# Patient Record
Sex: Female | Born: 1955
Health system: Southern US, Community
[De-identification: ages and names within clinical notes are randomized; demographics above are authoritative.]

## PROBLEM LIST (undated history)

## (undated) DIAGNOSIS — M1990 Unspecified osteoarthritis, unspecified site: Secondary | ICD-10-CM

## (undated) DIAGNOSIS — Z87442 Personal history of urinary calculi: Secondary | ICD-10-CM

## (undated) DIAGNOSIS — K219 Gastro-esophageal reflux disease without esophagitis: Principal | ICD-10-CM

## (undated) DIAGNOSIS — F419 Anxiety disorder, unspecified: Secondary | ICD-10-CM

## (undated) DIAGNOSIS — M8580 Other specified disorders of bone density and structure, unspecified site: Secondary | ICD-10-CM

## (undated) DIAGNOSIS — D069 Carcinoma in situ of cervix, unspecified: Secondary | ICD-10-CM

## (undated) DIAGNOSIS — I10 Essential (primary) hypertension: Secondary | ICD-10-CM

## (undated) DIAGNOSIS — T7840XA Allergy, unspecified, initial encounter: Secondary | ICD-10-CM

## (undated) DIAGNOSIS — N189 Chronic kidney disease, unspecified: Secondary | ICD-10-CM

## (undated) DIAGNOSIS — M7138 Other bursal cyst, other site: Secondary | ICD-10-CM

## (undated) DIAGNOSIS — IMO0002 Reserved for concepts with insufficient information to code with codable children: Secondary | ICD-10-CM

## (undated) DIAGNOSIS — D219 Benign neoplasm of connective and other soft tissue, unspecified: Secondary | ICD-10-CM

## (undated) DIAGNOSIS — Z800 Family history of malignant neoplasm of digestive organs: Secondary | ICD-10-CM

## (undated) DIAGNOSIS — E559 Vitamin D deficiency, unspecified: Secondary | ICD-10-CM

## (undated) HISTORY — DX: Essential (primary) hypertension: I10

## (undated) HISTORY — DX: Anxiety disorder, unspecified: F41.9

## (undated) HISTORY — DX: Benign neoplasm of connective and other soft tissue, unspecified: D21.9

## (undated) HISTORY — PX: LASIK: SHX215

## (undated) HISTORY — DX: Vitamin D deficiency, unspecified: E55.9

## (undated) HISTORY — DX: Reserved for concepts with insufficient information to code with codable children: IMO0002

## (undated) HISTORY — DX: Carcinoma in situ of cervix, unspecified: D06.9

## (undated) HISTORY — DX: Family history of malignant neoplasm of digestive organs: Z80.0

## (undated) HISTORY — DX: Chronic kidney disease, unspecified: N18.9

## (undated) HISTORY — PX: TONSILLECTOMY AND ADENOIDECTOMY: SUR1326

## (undated) HISTORY — PX: COLONOSCOPY: SHX174

## (undated) HISTORY — DX: Allergy, unspecified, initial encounter: T78.40XA

## (undated) HISTORY — DX: Other bursal cyst, other site: M71.38

## (undated) HISTORY — DX: Other specified disorders of bone density and structure, unspecified site: M85.80

## (undated) HISTORY — PX: OTHER SURGICAL HISTORY: SHX169

## (undated) HISTORY — PX: MOHS SURGERY: SUR867

## (undated) HISTORY — DX: Gastro-esophageal reflux disease without esophagitis: K21.9

---

## 1983-07-18 DIAGNOSIS — D069 Carcinoma in situ of cervix, unspecified: Secondary | ICD-10-CM

## 1983-07-18 HISTORY — DX: Carcinoma in situ of cervix, unspecified: D06.9

## 1997-07-12 ENCOUNTER — Ambulatory Visit (HOSPITAL_COMMUNITY): Admission: RE | Admit: 1997-07-12 | Discharge: 1997-07-12 | Payer: Self-pay | Admitting: Obstetrics and Gynecology

## 1998-03-25 ENCOUNTER — Other Ambulatory Visit: Admission: RE | Admit: 1998-03-25 | Discharge: 1998-03-25 | Payer: Self-pay | Admitting: Obstetrics and Gynecology

## 1998-08-21 ENCOUNTER — Encounter: Payer: Self-pay | Admitting: Obstetrics and Gynecology

## 1998-08-21 ENCOUNTER — Ambulatory Visit (HOSPITAL_COMMUNITY): Admission: RE | Admit: 1998-08-21 | Discharge: 1998-08-21 | Payer: Self-pay | Admitting: Obstetrics and Gynecology

## 1998-08-30 ENCOUNTER — Encounter: Payer: Self-pay | Admitting: Obstetrics and Gynecology

## 1998-08-30 ENCOUNTER — Ambulatory Visit (HOSPITAL_COMMUNITY): Admission: RE | Admit: 1998-08-30 | Discharge: 1998-08-30 | Payer: Self-pay | Admitting: Obstetrics and Gynecology

## 1998-12-08 ENCOUNTER — Encounter: Payer: Self-pay | Admitting: Emergency Medicine

## 1998-12-08 ENCOUNTER — Emergency Department (HOSPITAL_COMMUNITY): Admission: EM | Admit: 1998-12-08 | Discharge: 1998-12-08 | Payer: Self-pay | Admitting: Emergency Medicine

## 1999-08-27 ENCOUNTER — Encounter: Payer: Self-pay | Admitting: Family Medicine

## 1999-08-27 ENCOUNTER — Ambulatory Visit (HOSPITAL_COMMUNITY): Admission: RE | Admit: 1999-08-27 | Discharge: 1999-08-27 | Payer: Self-pay | Admitting: Family Medicine

## 2000-09-08 ENCOUNTER — Encounter: Payer: Self-pay | Admitting: Family Medicine

## 2000-09-08 ENCOUNTER — Ambulatory Visit (HOSPITAL_COMMUNITY): Admission: RE | Admit: 2000-09-08 | Discharge: 2000-09-08 | Payer: Self-pay | Admitting: Family Medicine

## 2001-03-02 ENCOUNTER — Encounter: Payer: Self-pay | Admitting: Family Medicine

## 2001-03-02 ENCOUNTER — Ambulatory Visit (HOSPITAL_COMMUNITY): Admission: RE | Admit: 2001-03-02 | Discharge: 2001-03-02 | Payer: Self-pay

## 2001-09-16 ENCOUNTER — Ambulatory Visit (HOSPITAL_COMMUNITY): Admission: RE | Admit: 2001-09-16 | Discharge: 2001-09-16 | Payer: Self-pay | Admitting: Family Medicine

## 2001-09-16 ENCOUNTER — Encounter: Payer: Self-pay | Admitting: Family Medicine

## 2002-10-27 ENCOUNTER — Other Ambulatory Visit: Admission: RE | Admit: 2002-10-27 | Discharge: 2002-10-27 | Payer: Self-pay | Admitting: Obstetrics and Gynecology

## 2003-10-29 ENCOUNTER — Other Ambulatory Visit: Admission: RE | Admit: 2003-10-29 | Discharge: 2003-10-29 | Payer: Self-pay | Admitting: Obstetrics and Gynecology

## 2003-12-06 ENCOUNTER — Ambulatory Visit (HOSPITAL_COMMUNITY): Admission: RE | Admit: 2003-12-06 | Discharge: 2003-12-06 | Payer: Self-pay | Admitting: Obstetrics and Gynecology

## 2004-02-17 DIAGNOSIS — D219 Benign neoplasm of connective and other soft tissue, unspecified: Secondary | ICD-10-CM

## 2004-02-17 HISTORY — DX: Benign neoplasm of connective and other soft tissue, unspecified: D21.9

## 2004-11-18 ENCOUNTER — Other Ambulatory Visit: Admission: RE | Admit: 2004-11-18 | Discharge: 2004-11-18 | Payer: Self-pay | Admitting: Obstetrics and Gynecology

## 2005-03-04 ENCOUNTER — Ambulatory Visit (HOSPITAL_COMMUNITY): Admission: RE | Admit: 2005-03-04 | Discharge: 2005-03-04 | Payer: Self-pay | Admitting: Obstetrics and Gynecology

## 2005-06-23 ENCOUNTER — Emergency Department (HOSPITAL_COMMUNITY): Admission: EM | Admit: 2005-06-23 | Discharge: 2005-06-23 | Payer: Self-pay | Admitting: Family Medicine

## 2005-07-08 ENCOUNTER — Ambulatory Visit: Payer: Self-pay | Admitting: Internal Medicine

## 2005-07-20 ENCOUNTER — Ambulatory Visit: Payer: Self-pay | Admitting: Internal Medicine

## 2005-12-16 ENCOUNTER — Other Ambulatory Visit: Admission: RE | Admit: 2005-12-16 | Discharge: 2005-12-16 | Payer: Self-pay | Admitting: Obstetrics and Gynecology

## 2006-04-14 ENCOUNTER — Ambulatory Visit (HOSPITAL_COMMUNITY): Admission: RE | Admit: 2006-04-14 | Discharge: 2006-04-14 | Payer: Self-pay | Admitting: Obstetrics and Gynecology

## 2006-12-18 DIAGNOSIS — IMO0002 Reserved for concepts with insufficient information to code with codable children: Secondary | ICD-10-CM

## 2006-12-18 HISTORY — DX: Reserved for concepts with insufficient information to code with codable children: IMO0002

## 2006-12-22 ENCOUNTER — Other Ambulatory Visit: Admission: RE | Admit: 2006-12-22 | Discharge: 2006-12-22 | Payer: Self-pay | Admitting: Obstetrics and Gynecology

## 2007-05-18 ENCOUNTER — Other Ambulatory Visit: Admission: RE | Admit: 2007-05-18 | Discharge: 2007-05-18 | Payer: Self-pay | Admitting: Obstetrics and Gynecology

## 2007-08-10 ENCOUNTER — Ambulatory Visit (HOSPITAL_COMMUNITY): Admission: RE | Admit: 2007-08-10 | Discharge: 2007-08-10 | Payer: Self-pay | Admitting: Obstetrics and Gynecology

## 2007-09-08 ENCOUNTER — Emergency Department (HOSPITAL_COMMUNITY): Admission: EM | Admit: 2007-09-08 | Discharge: 2007-09-08 | Payer: Self-pay | Admitting: Family Medicine

## 2007-11-02 ENCOUNTER — Other Ambulatory Visit: Admission: RE | Admit: 2007-11-02 | Discharge: 2007-11-02 | Payer: Self-pay | Admitting: Obstetrics and Gynecology

## 2008-05-30 ENCOUNTER — Other Ambulatory Visit: Admission: RE | Admit: 2008-05-30 | Discharge: 2008-05-30 | Payer: Self-pay | Admitting: Obstetrics and Gynecology

## 2008-09-05 ENCOUNTER — Ambulatory Visit (HOSPITAL_COMMUNITY): Admission: RE | Admit: 2008-09-05 | Discharge: 2008-09-05 | Payer: Self-pay | Admitting: Obstetrics and Gynecology

## 2009-02-16 DIAGNOSIS — K219 Gastro-esophageal reflux disease without esophagitis: Secondary | ICD-10-CM

## 2009-02-16 HISTORY — DX: Gastro-esophageal reflux disease without esophagitis: K21.9

## 2009-05-18 ENCOUNTER — Emergency Department (HOSPITAL_BASED_OUTPATIENT_CLINIC_OR_DEPARTMENT_OTHER): Admission: EM | Admit: 2009-05-18 | Discharge: 2009-05-18 | Payer: Self-pay | Admitting: Emergency Medicine

## 2009-05-28 ENCOUNTER — Encounter: Admission: RE | Admit: 2009-05-28 | Discharge: 2009-05-28 | Payer: Self-pay | Admitting: Internal Medicine

## 2009-08-25 ENCOUNTER — Emergency Department (HOSPITAL_COMMUNITY): Admission: EM | Admit: 2009-08-25 | Discharge: 2009-08-25 | Payer: Self-pay | Admitting: Family Medicine

## 2009-11-04 ENCOUNTER — Ambulatory Visit (HOSPITAL_COMMUNITY): Admission: RE | Admit: 2009-11-04 | Discharge: 2009-11-04 | Payer: Self-pay | Admitting: Interventional Cardiology

## 2009-11-20 ENCOUNTER — Ambulatory Visit (HOSPITAL_COMMUNITY): Admission: RE | Admit: 2009-11-20 | Discharge: 2009-11-20 | Payer: Self-pay | Admitting: Obstetrics and Gynecology

## 2010-03-09 ENCOUNTER — Encounter: Payer: Self-pay | Admitting: Obstetrics and Gynecology

## 2010-10-27 ENCOUNTER — Other Ambulatory Visit: Payer: Self-pay | Admitting: Obstetrics and Gynecology

## 2010-10-27 DIAGNOSIS — Z1231 Encounter for screening mammogram for malignant neoplasm of breast: Secondary | ICD-10-CM

## 2010-11-26 ENCOUNTER — Ambulatory Visit (HOSPITAL_COMMUNITY)
Admission: RE | Admit: 2010-11-26 | Discharge: 2010-11-26 | Disposition: A | Discharge status: Home / Self Care | Payer: 59 | Source: Ambulatory Visit | Attending: Obstetrics and Gynecology | Admitting: Obstetrics and Gynecology

## 2010-11-26 DIAGNOSIS — Z1231 Encounter for screening mammogram for malignant neoplasm of breast: Secondary | ICD-10-CM | POA: Insufficient documentation

## 2011-02-02 ENCOUNTER — Encounter (HOSPITAL_COMMUNITY): Payer: Self-pay

## 2011-02-18 ENCOUNTER — Encounter (HOSPITAL_COMMUNITY): Payer: Self-pay

## 2011-02-18 ENCOUNTER — Encounter (HOSPITAL_COMMUNITY)
Admission: RE | Admit: 2011-02-18 | Discharge: 2011-02-18 | Disposition: A | Discharge status: Home / Self Care | Payer: 59 | Source: Ambulatory Visit | Attending: Obstetrics and Gynecology | Admitting: Obstetrics and Gynecology

## 2011-02-18 ENCOUNTER — Other Ambulatory Visit: Payer: Self-pay

## 2011-02-18 HISTORY — DX: Essential (primary) hypertension: I10

## 2011-02-18 LAB — CBC
MCV: 92.4 fL (ref 78.0–100.0)
Platelets: 356 10*3/uL (ref 150–400)
RBC: 4.22 MIL/uL (ref 3.87–5.11)
RDW: 12.4 % (ref 11.5–15.5)
WBC: 8.8 10*3/uL (ref 4.0–10.5)

## 2011-02-18 LAB — COMPREHENSIVE METABOLIC PANEL
ALT: 11 U/L (ref 0–35)
AST: 15 U/L (ref 0–37)
Albumin: 3.4 g/dL — ABNORMAL LOW (ref 3.5–5.2)
Chloride: 101 mEq/L (ref 96–112)
Creatinine, Ser: 0.61 mg/dL (ref 0.50–1.10)
Sodium: 136 mEq/L (ref 135–145)
Total Bilirubin: 0.4 mg/dL (ref 0.3–1.2)

## 2011-02-18 NOTE — Patient Instructions (Addendum)
[address] Debra Nguyen  02/18/2011   Your procedure is scheduled on:  02/23/11 7:30  Enter through the Main Entrance of [facility] at 600 AM.  Pick up the phone at the desk and dial [phone].   Call this number if you have problems the morning of surgery: [phone]   Remember:   Do not eat food:After Midnight.  Do not drink clear liquids: After Midnight.  Take these medicines the morning of surgery with A SIP OF WATER: Celebrex per instructions of Dr. Tresa Res   Do not wear jewelry, make-up or nail polish.  Do not wear lotions, powders, or perfumes. You may wear deodorant.  Do not shave 48 hours prior to surgery.  Do not bring valuables to the hospital.  Contacts, dentures or bridgework may not be worn into surgery.  Leave suitcase in the car. After surgery it may be brought to your room.  For patients admitted to the hospital, checkout time is 11:00 AM the day of discharge.   Patients discharged the day of surgery will not be allowed to drive home.  Name and phone number of your driver: undecided  Special Instructions: CHG Shower Use Special Wash: 1/2 bottle night before surgery and 1/2 bottle morning of surgery.   Please read over the following fact sheets that you were given: MRSA Information

## 2011-02-18 NOTE — Pre-Procedure Instructions (Signed)
EKG approved by Dr. Brayton Caves.

## 2011-02-20 NOTE — H&P (Signed)
56 yo DWF G 2 P 2 with post menopausal bleeding and recurrent CIN for R-TLH 02-23-11.  PUS last done 11/30/`11/and showed her uterus was 11 x 6 x 7 cm with 4 fibroids, the largest being 6 x 5 cm, and myometrial streaking c/w adenomyosis.  Adnexa were wnl.  Endometrial biopsy at that time was benign.  Patient also has a long history of abnormal paps, dating to 45 when she had cryo for CIN 3, and since that time has intermittantly had mildly abnormal paps, ASCUS +HPV.  Pap 12/18/2010 was LGSIL.  Patient has had previous colposcopies demonstrating mild disease.  She is on HRT and wants to continue it, however is weary of the recurring DUB and the recurrent colposcopies.  Pt elects a trial of difinitive tx with R-TLH at this time.  She does clearly understand that the +HPV can recur in the vagina after hysterectomy.  We discussed that a colposcopy to rule out invasive disease is standard of care before hysterectomy, and patient elected to forgo colposcopy due to the very low probability of invasive disease.  She does accept the risk that if she does have invasive disease, she will need further surgery.    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  Neg cig.  Social alcohol.  PE: WF in NAD  BMI 23.4  HEENT WNL  Lungs Clear to A & P  Heart:  RRR  Abd:  Soft, NT,  No masses or HSM  Ext:  NL  Pelvic:  Ext, vag, cx normal.  Uterus mid position, with large fibroid off to right, mostly into CDS.  Fibroid is about 7 cm.  Adnexa WNL.  Endometrial biopsy repeated 01/28/11 and was benign.   Pt instructed in celebrex 200 mg two tabs am of surgery, then one po bid to total #6.  Given RX for percocet 5/325 for use post op.  Stop premarin 02/11/11 and change to vivelle dot 0.05 mg 2 times per week until surgery and for at least 2 weeks after to decrease DVT risk.     Risks and poss complications discussed and accepted by patient.  A:  PMB, fibroids, recurrent CIN  P:  R-TLH, retain ovaries.  However, if I think there may be an abnormality at the time of surgery, patient gives me permission to remove one or both ovaries and tubes.

## 2011-02-22 MED ORDER — DEXTROSE 5 % IV SOLN
1.0000 g | INTRAVENOUS | Status: AC
  Administered 2011-02-23: 1 g via INTRAVENOUS
  Filled 2011-02-22: qty 1

## 2011-02-23 ENCOUNTER — Ambulatory Visit (HOSPITAL_COMMUNITY): Payer: 59 | Admitting: Anesthesiology

## 2011-02-23 ENCOUNTER — Encounter (HOSPITAL_COMMUNITY): Payer: Self-pay | Admitting: *Deleted

## 2011-02-23 ENCOUNTER — Encounter (HOSPITAL_COMMUNITY): Payer: Self-pay | Admitting: Anesthesiology

## 2011-02-23 ENCOUNTER — Ambulatory Visit (HOSPITAL_COMMUNITY)
Admission: RE | Admit: 2011-02-23 | Discharge: 2011-02-23 | Disposition: A | Discharge status: Home / Self Care | Payer: 59 | Source: Ambulatory Visit | Attending: Obstetrics and Gynecology | Admitting: Obstetrics and Gynecology

## 2011-02-23 ENCOUNTER — Encounter (HOSPITAL_COMMUNITY)
Admission: RE | Disposition: A | Discharge status: Home / Self Care | Payer: Self-pay | Source: Ambulatory Visit | Attending: Obstetrics and Gynecology

## 2011-02-23 ENCOUNTER — Other Ambulatory Visit: Payer: Self-pay | Admitting: Obstetrics and Gynecology

## 2011-02-23 DIAGNOSIS — D251 Intramural leiomyoma of uterus: Secondary | ICD-10-CM | POA: Insufficient documentation

## 2011-02-23 DIAGNOSIS — N8000 Endometriosis of the uterus, unspecified: Secondary | ICD-10-CM | POA: Insufficient documentation

## 2011-02-23 DIAGNOSIS — N870 Mild cervical dysplasia: Secondary | ICD-10-CM | POA: Insufficient documentation

## 2011-02-23 DIAGNOSIS — Z01818 Encounter for other preprocedural examination: Secondary | ICD-10-CM | POA: Insufficient documentation

## 2011-02-23 DIAGNOSIS — Z01812 Encounter for preprocedural laboratory examination: Secondary | ICD-10-CM | POA: Insufficient documentation

## 2011-02-23 DIAGNOSIS — N950 Postmenopausal bleeding: Secondary | ICD-10-CM | POA: Insufficient documentation

## 2011-02-23 DIAGNOSIS — I10 Essential (primary) hypertension: Secondary | ICD-10-CM | POA: Insufficient documentation

## 2011-02-23 HISTORY — PX: ABDOMINAL HYSTERECTOMY: SHX81

## 2011-02-23 HISTORY — PX: CYSTOSCOPY: SHX5120

## 2011-02-23 SURGERY — ROBOTIC ASSISTED TOTAL HYSTERECTOMY
Anesthesia: General | Site: Urethra | Wound class: Clean Contaminated

## 2011-02-23 MED ORDER — NEOSTIGMINE METHYLSULFATE 1 MG/ML IJ SOLN
INTRAMUSCULAR | Status: DC | PRN
  Administered 2011-02-23: 2 mg via INTRAVENOUS

## 2011-02-23 MED ORDER — ROPIVACAINE HCL 5 MG/ML IJ SOLN
INTRAMUSCULAR | Status: DC | PRN
  Administered 2011-02-23: 88 mL via EPIDURAL

## 2011-02-23 MED ORDER — LACTATED RINGERS IV SOLN
INTRAVENOUS | Status: DC
  Administered 2011-02-23: 07:00:00 via INTRAVENOUS

## 2011-02-23 MED ORDER — PROPOFOL 10 MG/ML IV EMUL
INTRAVENOUS | Status: AC
  Filled 2011-02-23: qty 20

## 2011-02-23 MED ORDER — OXYCODONE-ACETAMINOPHEN 5-325 MG PO TABS
1.0000 | ORAL_TABLET | ORAL | Status: DC | PRN
  Administered 2011-02-23: 2 via ORAL
  Filled 2011-02-23: qty 2

## 2011-02-23 MED ORDER — PROPOFOL 10 MG/ML IV EMUL
INTRAVENOUS | Status: DC | PRN
  Administered 2011-02-23: 160 mg via INTRAVENOUS

## 2011-02-23 MED ORDER — FENTANYL CITRATE 0.05 MG/ML IJ SOLN
INTRAMUSCULAR | Status: AC
  Filled 2011-02-23: qty 2

## 2011-02-23 MED ORDER — ONDANSETRON HCL 4 MG/2ML IJ SOLN
INTRAMUSCULAR | Status: DC | PRN
  Administered 2011-02-23: 4 mg via INTRAVENOUS

## 2011-02-23 MED ORDER — HYDROMORPHONE HCL PF 1 MG/ML IJ SOLN
INTRAMUSCULAR | Status: AC
  Filled 2011-02-23: qty 1

## 2011-02-23 MED ORDER — SODIUM CHLORIDE 0.9 % IJ SOLN
INTRAMUSCULAR | Status: DC | PRN
  Administered 2011-02-23: 88 mL

## 2011-02-23 MED ORDER — LIDOCAINE HCL (CARDIAC) 20 MG/ML IV SOLN
INTRAVENOUS | Status: DC | PRN
  Administered 2011-02-23: 50 mg via INTRAVENOUS

## 2011-02-23 MED ORDER — FENTANYL CITRATE 0.05 MG/ML IJ SOLN
25.0000 ug | INTRAMUSCULAR | Status: DC | PRN
  Administered 2011-02-23 (×2): 50 ug via INTRAVENOUS

## 2011-02-23 MED ORDER — FENTANYL CITRATE 0.05 MG/ML IJ SOLN
INTRAMUSCULAR | Status: DC | PRN
  Administered 2011-02-23: 150 ug via INTRAVENOUS
  Administered 2011-02-23 (×2): 100 ug via INTRAVENOUS

## 2011-02-23 MED ORDER — GLYCOPYRROLATE 0.2 MG/ML IJ SOLN
INTRAMUSCULAR | Status: DC | PRN
  Administered 2011-02-23: 0.2 mg via INTRAVENOUS
  Administered 2011-02-23: .4 mg via INTRAVENOUS

## 2011-02-23 MED ORDER — DEXAMETHASONE SODIUM PHOSPHATE 10 MG/ML IJ SOLN
INTRAMUSCULAR | Status: AC
  Filled 2011-02-23: qty 1

## 2011-02-23 MED ORDER — STERILE WATER FOR IRRIGATION IR SOLN
Status: DC | PRN
  Administered 2011-02-23: 1000 mL

## 2011-02-23 MED ORDER — DEXAMETHASONE SODIUM PHOSPHATE 10 MG/ML IJ SOLN
INTRAMUSCULAR | Status: DC | PRN
  Administered 2011-02-23: 10 mg via INTRAVENOUS

## 2011-02-23 MED ORDER — ROCURONIUM BROMIDE 100 MG/10ML IV SOLN
INTRAVENOUS | Status: DC | PRN
  Administered 2011-02-23: 50 mg via INTRAVENOUS
  Administered 2011-02-23: 20 mg via INTRAVENOUS

## 2011-02-23 MED ORDER — FENTANYL CITRATE 0.05 MG/ML IJ SOLN
INTRAMUSCULAR | Status: AC
  Filled 2011-02-23: qty 5

## 2011-02-23 MED ORDER — MIDAZOLAM HCL 5 MG/5ML IJ SOLN
INTRAMUSCULAR | Status: DC | PRN
  Administered 2011-02-23: 2 mg via INTRAVENOUS

## 2011-02-23 MED ORDER — ARTIFICIAL TEARS OP OINT
TOPICAL_OINTMENT | OPHTHALMIC | Status: DC | PRN
  Administered 2011-02-23: 1 via OPHTHALMIC

## 2011-02-23 MED ORDER — ROCURONIUM BROMIDE 50 MG/5ML IV SOLN
INTRAVENOUS | Status: AC
  Filled 2011-02-23: qty 1

## 2011-02-23 MED ORDER — MIDAZOLAM HCL 2 MG/2ML IJ SOLN
INTRAMUSCULAR | Status: AC
  Filled 2011-02-23: qty 2

## 2011-02-23 MED ORDER — ONDANSETRON HCL 4 MG/2ML IJ SOLN
INTRAMUSCULAR | Status: AC
  Filled 2011-02-23: qty 2

## 2011-02-23 MED ORDER — PROMETHAZINE HCL 25 MG/ML IJ SOLN: 12.5000 mg | INTRAMUSCULAR | Status: DC | PRN

## 2011-02-23 MED ORDER — ACETAMINOPHEN 10 MG/ML IV SOLN
1000.0000 mg | Freq: Once | INTRAVENOUS | Status: DC | PRN
  Filled 2011-02-23: qty 100

## 2011-02-23 MED ORDER — LACTATED RINGERS IR SOLN
Status: DC | PRN
  Administered 2011-02-23: 3000 mL

## 2011-02-23 MED ORDER — HYDROMORPHONE HCL PF 1 MG/ML IJ SOLN
INTRAMUSCULAR | Status: DC | PRN
  Administered 2011-02-23: 0.5 mg via INTRAVENOUS

## 2011-02-23 MED ORDER — INDIGOTINDISULFONATE SODIUM 8 MG/ML IJ SOLN
INTRAMUSCULAR | Status: DC | PRN
  Administered 2011-02-23: 5 mL via INTRAVENOUS

## 2011-02-23 MED ORDER — LIDOCAINE HCL (CARDIAC) 20 MG/ML IV SOLN
INTRAVENOUS | Status: AC
  Filled 2011-02-23: qty 5

## 2011-02-23 SURGICAL SUPPLY — 74 items
ADH SKN CLS APL DERMABOND .7 (GAUZE/BANDAGES/DRESSINGS) ×9
APL SKNCLS STERI-STRIP NONHPOA (GAUZE/BANDAGES/DRESSINGS)
BAG URINE DRAINAGE (UROLOGICAL SUPPLIES) ×4 IMPLANT
BARRIER ADHS 3X4 INTERCEED (GAUZE/BANDAGES/DRESSINGS) ×4 IMPLANT
BENZOIN TINCTURE PRP APPL 2/3 (GAUZE/BANDAGES/DRESSINGS) IMPLANT
BLADELESS LONG 8MM (BLADE) IMPLANT
BRR ADH 4X3 ABS CNTRL BYND (GAUZE/BANDAGES/DRESSINGS) ×3
CABLE HIGH FREQUENCY MONO STRZ (ELECTRODE) ×4 IMPLANT
CATH FOLEY 3WAY  5CC 16FR (CATHETERS) ×1
CATH FOLEY 3WAY 5CC 16FR (CATHETERS) ×3 IMPLANT
CONT PATH 16OZ SNAP LID 3702 (MISCELLANEOUS) ×4 IMPLANT
COVER MAYO STAND STRL (DRAPES) ×4 IMPLANT
COVER TABLE BACK 60X90 (DRAPES) ×8 IMPLANT
COVER TIP SHEARS 8 DVNC (MISCELLANEOUS) ×3 IMPLANT
COVER TIP SHEARS 8MM DA VINCI (MISCELLANEOUS) ×1
DECANTER SPIKE VIAL GLASS SM (MISCELLANEOUS) ×4 IMPLANT
DERMABOND ADVANCED (GAUZE/BANDAGES/DRESSINGS) ×3
DERMABOND ADVANCED .7 DNX12 (GAUZE/BANDAGES/DRESSINGS) ×9 IMPLANT
DRAPE HUG U DISPOSABLE (DRAPE) ×4 IMPLANT
DRAPE LG THREE QUARTER DISP (DRAPES) ×8 IMPLANT
DRAPE MONITOR DA VINCI (DRAPE) IMPLANT
DRAPE WARM FLUID 44X44 (DRAPE) ×4 IMPLANT
ELECT REM PT RETURN 9FT ADLT (ELECTROSURGICAL) ×4
ELECTRODE REM PT RTRN 9FT ADLT (ELECTROSURGICAL) ×3 IMPLANT
EVACUATOR SMOKE 8.L (FILTER) ×4 IMPLANT
GAUZE VASELINE 3X9 (GAUZE/BANDAGES/DRESSINGS) IMPLANT
GLOVE BIO SURGEON STRL SZ 6.5 (GLOVE) ×12 IMPLANT
GLOVE BIOGEL PI IND STRL 7.0 (GLOVE) ×12 IMPLANT
GLOVE BIOGEL PI INDICATOR 7.0 (GLOVE) ×4
GLOVE ECLIPSE 6.5 STRL STRAW (GLOVE) ×16 IMPLANT
GOWN PREVENTION PLUS LG XLONG (DISPOSABLE) ×28 IMPLANT
GYRUS RUMI II 2.5CM BLUE (DISPOSABLE)
GYRUS RUMI II 3.5CM BLUE (DISPOSABLE)
GYRUS RUMI II 4.0CM BLUE (DISPOSABLE)
KIT ACCESSORY DA VINCI DISP (KITS) ×1
KIT ACCESSORY DVNC DISP (KITS) ×3 IMPLANT
KIT DISP ACCESSORY 4 ARM (KITS) IMPLANT
NEEDLE INSUFFLATION 14GA 120MM (NEEDLE) ×4 IMPLANT
NS IRRIG 1000ML POUR BTL (IV SOLUTION) ×12 IMPLANT
OCCLUDER COLPOPNEUMO (BALLOONS) ×4 IMPLANT
PACK LAVH (CUSTOM PROCEDURE TRAY) ×4 IMPLANT
PAD PREP 24X48 CUFFED NSTRL (MISCELLANEOUS) ×8 IMPLANT
PLUG CATH AND CAP STER (CATHETERS) ×4 IMPLANT
RUMI II 3.0CM BLUE KOH-EFFICIE (DISPOSABLE) IMPLANT
RUMI II GYRUS 2.5CM BLUE (DISPOSABLE) IMPLANT
RUMI II GYRUS 3.5CM BLUE (DISPOSABLE) IMPLANT
RUMI II GYRUS 4.0CM BLUE (DISPOSABLE) IMPLANT
SET CYSTO W/LG BORE CLAMP LF (SET/KITS/TRAYS/PACK) ×4 IMPLANT
SET IRRIG TUBING LAPAROSCOPIC (IRRIGATION / IRRIGATOR) ×4 IMPLANT
SOLUTION ELECTROLUBE (MISCELLANEOUS) ×4 IMPLANT
SPONGE LAP 18X18 X RAY DECT (DISPOSABLE) IMPLANT
STRIP CLOSURE SKIN 1/4X4 (GAUZE/BANDAGES/DRESSINGS) IMPLANT
SUT VIC AB 0 CT1 27 (SUTURE) ×24
SUT VIC AB 0 CT1 27XBRD ANBCTR (SUTURE) ×3 IMPLANT
SUT VIC AB 0 CT1 27XBRD ANTBC (SUTURE) ×15 IMPLANT
SUT VICRYL 0 UR6 27IN ABS (SUTURE) ×4 IMPLANT
SUT VICRYL 4-0 PS2 18IN ABS (SUTURE) ×8 IMPLANT
SUT VICRYL RAPIDE 4/0 PS 2 (SUTURE) ×8 IMPLANT
SYR 30ML LL (SYRINGE) ×4 IMPLANT
SYR 50ML LL SCALE MARK (SYRINGE) ×4 IMPLANT
SYSTEM CONVERTIBLE TROCAR (TROCAR) IMPLANT
TIP UTERINE 5.1X6CM LAV DISP (MISCELLANEOUS) IMPLANT
TIP UTERINE 6.7X10CM GRN DISP (MISCELLANEOUS) IMPLANT
TIP UTERINE 6.7X6CM WHT DISP (MISCELLANEOUS) IMPLANT
TIP UTERINE 6.7X8CM BLUE DISP (MISCELLANEOUS) ×4 IMPLANT
TOWEL OR 17X24 6PK STRL BLUE (TOWEL DISPOSABLE) ×12 IMPLANT
TROCAR 12M 150ML BLUNT (TROCAR) ×4 IMPLANT
TROCAR DISP BLADELESS 8 DVNC (TROCAR) ×3 IMPLANT
TROCAR DISP BLADELESS 8MM (TROCAR) ×1
TROCAR XCEL 12X100 BLDLESS (ENDOMECHANICALS) IMPLANT
TROCAR XCEL NON-BLD 5MMX100MML (ENDOMECHANICALS) ×4 IMPLANT
TROCAR Z-THREAD 12X150 (TROCAR) ×4 IMPLANT
TUBING FILTER THERMOFLATOR (ELECTROSURGICAL) ×4 IMPLANT
WARMER LAPAROSCOPE (MISCELLANEOUS) ×4 IMPLANT

## 2011-02-23 NOTE — Progress Notes (Signed)
Pt discharged to home with neighbor/friend, Pam.  Condition stable.  Pt ambulated to car with C. Mayford Knife, [place].  No equipment ordered for home at discharge.

## 2011-02-23 NOTE — Op Note (Signed)
Preoperative diagnosis: Recurrent cervical intraepithelial neoplasia, large uterine fibroids Postoperative diagnosis: Same, path pending Procedure: Robotic assisted total laparoscopic hysterectomy Surgeon Dr. Meredeth Ide Assistant: Dr. Leda Quail Anesthesia: Gen. endotracheal Estimated blood loss: 50 cc Complications: None Procedure: The patient was taken to the operating room and after induction of adequate general endotracheal anesthesia was placed in the low dorsolithotomy position and prepped and draped in usual fashion a posterior weighted and anterior Deaver retractor were placed and the cervix was grasped on its anterior lip with a single-tooth tenaculum. the vaginal mucosa around the cervix was infiltrated with 10 cc of quarter percent ropivacaine. . the uterus sounded to 8.5 cm. the cervix was dilated to #19 Prat. a suture of 0 Vicryl was placed thru the anterior lip. an 8mm Rumi manipulator with a small Koh ring was placed into the uterus and around the cervix.  The suture was then tied over the [facility] ring.  The balloon was inflated with 8 cc of sterile saline.  A Foley catheter was placed.  Attention was next turned to the abdomen.  A small vertical incision was made in the crease just above the umbilicus.  Was done after infiltrating the skin with the quarter percent ropivacaine solution.  Pneumoperitoneum was then created with 3 L of CO2 using the automatic insufflator.  A Veress needle was removed.  An area in the right upper quadrant in the midclavicular line below the costal margin was infiltrated with dilute ropivacaine incised with a knife, and a 5 mm Optiview scope was placed into the peritoneal cavity.  Next a 12 mm disposable bladed trocar was placed under direct visualization at the umbilicus into the peritoneal space.  The abdominal wall was transilluminated with the life and a 5 mm scope and the sites for the robotic ports were marked on the right and left sides of the umbilicus  in a line parallel with the anterior superior iliac spine.  The skin was infiltrated with a dilute ropivacaine incised with a knife and the 8 mm trochars were inserted under direct visualization.  Robotic scope was then introduced.  The patient was placed in steep Trendelenburg, and the robot was brought in and docked from the left side.  Monopolar scissors were placed and port one in the PK gyrus to port to under direct visualization.  The surgeon then proceeded to the consult.  The uterus was enlarged with approximately 6 or 7 cm fibroid in the right uterine fundus but the uterus was mobile.  The tubes and ovaries did appear normal.  The ureters could be seen peristalsing clearly bilaterally.  Hysterectomy began by cauterizing the utero-ovarian ligaments tube and round ligament on either side.  These were incised with a sharp scissors.  Anterior and posterior leaves of the broad ligament were taken down sharply.  The bladder was dissected off the [facility] ring.  The uterine arteries were skeletonized, coagulated multiple times, and incised.  A circumferential colpotomy incision was made with Mono Polar cautery.  The specimen was delivered through the vagina.  The occluder was placed back into the vagina to maintain pneumoperitoneum.  The robotic camera was removed and 3 sutures of 0 Vicryl were placed into the abdomen, and the scope then replaced.  2 of the sutures were parked in the right paracolic gutter and the surgeon used the third one to began closing the vagina.  Closure vagina again on the patient's right with a figure-of-eight suture in the right ankle.  Next a figure-of-eight suture was  placed in the left ankle, and then also one in the midline.  The vagina was closed nicely, however there were some scant bleeding in the left ankle.  Therefore a fourth suture was dropped through the local port, and it was used to pull one more figure-of-eight suture in the left ankle.  This completed hemostasis.  Vaginal cuff  was irrigated and was hemostatic.  All the pedicles were inspected and were hemostatic.  The ureters bilaterally were clearly seen peristalsing.  Vaginal cuff sutures were cut and brought out through the 12 mm port at the umbilicus.  A sheet of Interceed was placed through the umbilical port and then placed over the vaginal cuff.  The upper abdomen had been inspected prior to beginning hysterectomy, and was found to be normal.  The robotic instruments were removed, and the robot was undocked and taken out of the surgical field.  The robotic trochars were removed under direct visualization.  Pneumoperitoneum was allowed to escape, as the anesthetist gave the patient deep breaths manually to assistant evacuating the CO2.  All the trocar sleeves were removed.  Fascia was closed with 0 Vicryl at the umbilicus.  The skin was closed at all sites with a 4-0 Vicryl Rapide and Dermabond.  Cystoscopy was then carried out, filling the bladder with 200 cc of sterile water, and both ureters could be seen extruding indigo carmine clearly.  The indigo carmine had been given IV as the procedure was being completed.  The cystoscope was removed and the Foley catheter was inserted only long enough to drain the remaining fluid from the bladder, and was then removed.  Cystoscopy of the bladder was otherwise normal, there being no sutures in the bladder or any abnormalities noted.  The procedure was terminated, and the patient was taken to the recovery room in satisfactory condition.  Sponge needle and instrument counts were correct x3.

## 2011-02-23 NOTE — Progress Notes (Signed)
Phoned Dr Tresa Res with update of patient's condition including urinary output of from 9:15am to 16:00pm.  VSS.  Incision sites normal.  Pt drinking large amounts of fluids ( )  with no nausea and pt ambulating without difficulty.  Dr. Tresa Res said to proceed with discharge as planned and to encourage pt to push PO fluids at home.

## 2011-02-23 NOTE — Anesthesia Preprocedure Evaluation (Signed)
Anesthesia Evaluation  Patient identified by MRN, date of birth, ID band Patient awake    Reviewed: Allergy & Precautions, H&P , NPO status , Patient's Chart, lab work & pertinent test results, reviewed documented beta blocker date and time   History of Anesthesia Complications Negative for: history of anesthetic complications  Airway Mallampati: I TM Distance: >3 FB Neck ROM: full    Dental  (+) Teeth Intact   Pulmonary neg pulmonary ROS,  clear to auscultation  Pulmonary exam normal       Cardiovascular Exercise Tolerance: Good hypertension (BP 140/99 today, relates to anxiety - normally 120s/70s), On Home Beta Blockers regular Normal    Neuro/Psych Negative Neurological ROS  Negative Psych ROS   GI/Hepatic negative GI ROS, Neg liver ROS,   Endo/Other  Negative Endocrine ROS  Renal/GU negative Renal ROS  Genitourinary negative   Musculoskeletal   Abdominal   Peds  Hematology negative hematology ROS (+)   Anesthesia Other Findings   Reproductive/Obstetrics negative OB ROS                           Anesthesia Physical Anesthesia Plan  ASA: II  Anesthesia Plan: General ETT   Post-op Pain Management:    Induction:   Airway Management Planned:   Additional Equipment:   Intra-op Plan:   Post-operative Plan:   Informed Consent: I have reviewed the patients History and Physical, chart, labs and discussed the procedure including the risks, benefits and alternatives for the proposed anesthesia with the patient or authorized representative who has indicated his/her understanding and acceptance.   Dental Advisory Given  Plan Discussed with: CRNA and Surgeon  Anesthesia Plan Comments:         Anesthesia Quick Evaluation

## 2011-02-23 NOTE — Transfer of Care (Signed)
Immediate Anesthesia Transfer of Care Note  Patient: Debra Nguyen  Procedure(s) Performed:  ROBOTIC ASSISTED TOTAL HYSTERECTOMY; CYSTOSCOPY  Patient Location: PACU  Anesthesia Type: General  Level of Consciousness: awake, alert  and oriented  Airway & Oxygen Therapy: Patient Spontanous Breathing and Patient connected to nasal cannula oxygen  Post-op Assessment: Report given to PACU RN and Post -op Vital signs reviewed and stable  Post vital signs: Reviewed and stable  Complications: No apparent anesthesia complications

## 2011-02-23 NOTE — Anesthesia Procedure Notes (Signed)
Procedure Name: Intubation Date/Time: 02/23/2011 7:31 AM Performed by: Madison Hickman Pre-anesthesia Checklist: Patient identified, Patient being monitored, Emergency Drugs available, Timeout performed and Suction available Patient Re-evaluated:Patient Re-evaluated prior to inductionOxygen Delivery Method: Circle System Utilized Preoxygenation: Pre-oxygenation with 100% oxygen Intubation Type: IV induction Ventilation: Mask ventilation without difficulty Laryngoscope Size: Mac and 3 Grade View: Grade I Tube type: Oral Tube size: 7.0 mm Number of attempts: 1 Airway Equipment and Method: stylet Placement Confirmation: ETT inserted through vocal cords under direct vision,  breath sounds checked- equal and bilateral and positive ETCO2 Secured at: 20 cm Tube secured with: Tape Dental Injury: Teeth and Oropharynx as per pre-operative assessment

## 2011-02-23 NOTE — Anesthesia Postprocedure Evaluation (Signed)
  Anesthesia Post-op Note  Patient: Debra Nguyen  Procedure(s) Performed:  ROBOTIC ASSISTED TOTAL HYSTERECTOMY; CYSTOSCOPY  Patient Location: PACU  Anesthesia Type: General  Level of Consciousness: awake, alert  and oriented  Airway and Oxygen Therapy: Patient Spontanous Breathing  Post-op Pain: none  Post-op Assessment: Post-op Vital signs reviewed, Patient's Cardiovascular Status Stable, Respiratory Function Stable, Patent Airway, No signs of Nausea or vomiting and Pain level controlled  Post-op Vital Signs: Reviewed and stable  Complications: No apparent anesthesia complications

## 2011-02-23 NOTE — Interval H&P Note (Signed)
History and Physical Interval Note:  02/23/2011 7:20 AM  Debra Nguyen  has presented today for surgery, with the diagnosis of fibroids, persistent LGSIL on pap   The various methods of treatment have been discussed with the patient and family. After consideration of risks, benefits and other options for treatment, the patient has consented to  Procedure(s): ROBOTIC ASSISTED TOTAL HYSTERECTOMY SALPINGO OOPHERECTOMY as a surgical intervention .  The patients' history has been reviewed, patient examined, no change in status, stable for surgery.  I have reviewed the patients' chart and labs.  Questions were answered to the patient's satisfaction.     [name]

## 2011-02-24 ENCOUNTER — Encounter (HOSPITAL_COMMUNITY): Payer: Self-pay | Admitting: Obstetrics and Gynecology

## 2011-10-28 ENCOUNTER — Other Ambulatory Visit: Payer: Self-pay | Admitting: Obstetrics and Gynecology

## 2011-10-28 DIAGNOSIS — Z1231 Encounter for screening mammogram for malignant neoplasm of breast: Secondary | ICD-10-CM

## 2011-12-02 ENCOUNTER — Ambulatory Visit (HOSPITAL_COMMUNITY)
Admission: RE | Admit: 2011-12-02 | Discharge: 2011-12-02 | Disposition: A | Discharge status: Home / Self Care | Payer: 59 | Source: Ambulatory Visit | Attending: Obstetrics and Gynecology | Admitting: Obstetrics and Gynecology

## 2011-12-02 DIAGNOSIS — Z1231 Encounter for screening mammogram for malignant neoplasm of breast: Secondary | ICD-10-CM | POA: Insufficient documentation

## 2012-06-14 ENCOUNTER — Other Ambulatory Visit (HOSPITAL_COMMUNITY): Payer: Self-pay | Admitting: Family Medicine

## 2012-06-14 ENCOUNTER — Ambulatory Visit (HOSPITAL_COMMUNITY)
Admission: RE | Admit: 2012-06-14 | Discharge: 2012-06-14 | Disposition: A | Discharge status: Home / Self Care | Payer: 59 | Source: Ambulatory Visit | Attending: Family Medicine | Admitting: Family Medicine

## 2012-06-14 DIAGNOSIS — R05 Cough: Secondary | ICD-10-CM

## 2012-06-14 DIAGNOSIS — R059 Cough, unspecified: Secondary | ICD-10-CM | POA: Insufficient documentation

## 2012-06-14 DIAGNOSIS — R079 Chest pain, unspecified: Secondary | ICD-10-CM | POA: Insufficient documentation

## 2012-06-14 DIAGNOSIS — M542 Cervicalgia: Secondary | ICD-10-CM | POA: Insufficient documentation

## 2012-06-14 DIAGNOSIS — M25519 Pain in unspecified shoulder: Secondary | ICD-10-CM | POA: Insufficient documentation

## 2012-06-17 ENCOUNTER — Other Ambulatory Visit (HOSPITAL_COMMUNITY): Payer: Self-pay | Admitting: Family Medicine

## 2012-06-17 DIAGNOSIS — M5412 Radiculopathy, cervical region: Secondary | ICD-10-CM

## 2012-06-22 ENCOUNTER — Ambulatory Visit (HOSPITAL_COMMUNITY)
Admission: RE | Admit: 2012-06-22 | Discharge: 2012-06-22 | Disposition: A | Discharge status: Home / Self Care | Payer: 59 | Source: Ambulatory Visit | Attending: Family Medicine | Admitting: Family Medicine

## 2012-06-22 DIAGNOSIS — M5412 Radiculopathy, cervical region: Secondary | ICD-10-CM

## 2012-06-22 DIAGNOSIS — Q762 Congenital spondylolisthesis: Secondary | ICD-10-CM | POA: Insufficient documentation

## 2012-06-22 DIAGNOSIS — M5020 Other cervical disc displacement, unspecified cervical region: Secondary | ICD-10-CM | POA: Insufficient documentation

## 2012-06-22 DIAGNOSIS — M5030 Other cervical disc degeneration, unspecified cervical region: Secondary | ICD-10-CM | POA: Insufficient documentation

## 2012-07-01 ENCOUNTER — Other Ambulatory Visit: Payer: Self-pay | Admitting: Neurosurgery

## 2012-07-07 ENCOUNTER — Encounter (HOSPITAL_COMMUNITY): Payer: Self-pay | Admitting: Pharmacy Technician

## 2012-07-13 ENCOUNTER — Encounter (HOSPITAL_COMMUNITY): Payer: Self-pay

## 2012-07-13 ENCOUNTER — Encounter (HOSPITAL_COMMUNITY)
Admission: RE | Admit: 2012-07-13 | Discharge: 2012-07-13 | Disposition: A | Discharge status: Home / Self Care | Payer: 59 | Source: Ambulatory Visit | Attending: Neurosurgery | Admitting: Neurosurgery

## 2012-07-13 DIAGNOSIS — Z01812 Encounter for preprocedural laboratory examination: Secondary | ICD-10-CM | POA: Insufficient documentation

## 2012-07-13 DIAGNOSIS — Z01818 Encounter for other preprocedural examination: Secondary | ICD-10-CM | POA: Insufficient documentation

## 2012-07-13 LAB — BASIC METABOLIC PANEL
BUN: 15 mg/dL (ref 6–23)
Calcium: 9.5 mg/dL (ref 8.4–10.5)
GFR calc non Af Amer: >90 mL/min (ref >90)
Glucose, Bld: 115 mg/dL — ABNORMAL HIGH (ref 70–99)

## 2012-07-13 LAB — CBC
MCH: 31.8 pg (ref 26.0–34.0)
MCHC: 34.5 g/dL (ref 30.0–36.0)
Platelets: 223 10*3/uL (ref 150–400)
RDW: 12.6 % (ref 11.5–15.5)

## 2012-07-13 NOTE — Progress Notes (Signed)
EKG requestd from Dr. Nils Flack office

## 2012-07-13 NOTE — Pre-Procedure Instructions (Signed)
Debra Nguyen  07/13/2012  Your procedure is scheduled on:  07-19-2012  Wednesday   Report to [facility] Stay Center at 5:30 AM.  Call this number if you have problems the morning of surgery: [phone]   Remember:   Do not eat food or drink liquids after midnight.    Take these medicines the morning of surgery with A SIP OF WATER: metoprolol(Toprol XL)              STOP ALL ASPIRIN AND ASPIRIN PRODUCTS BLOOD THINNERS   Do not wear jewelry, make-up or nail polish.  Do not wear lotions, powders, or perfumes.   Do not shave 48 hours prior to surgery.   Do not bring valuables to the hospital.  Contacts, dentures or bridgework may not be worn into surgery.    Leave suitcase in the car. After surgery it may be brought to your room.  For patients admitted to the hospital, checkout time is 11:00 AM the day of discharge.   Patients discharged the day of surgery will not be allowed to drive home.   Name and phone number of your driver:    Special Instructions: Shower using CHG 2 nights before surgery and the night before surgery.  If you shower the day of surgery use CHG.  Use special wash - you have one bottle of CHG for all showers.  You should use approximately 1/3 of the bottle for each shower.   Please read over the following fact sheets that you were given: Pain Booklet and Surgical Site Infection Prevention

## 2012-07-18 MED ORDER — CEFAZOLIN SODIUM-DEXTROSE 2-3 GM-% IV SOLR
2.0000 g | INTRAVENOUS | Status: AC
  Administered 2012-07-19: 2 g via INTRAVENOUS
  Filled 2012-07-18: qty 50

## 2012-07-18 NOTE — H&P (Signed)
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  She states her pain is worse at night.  She has been noticing numbness in her fingers starting 06/26/2012.  She states that this began in 03/2012 and has gradually worsened and now is interfering with her ability to sleep and get any rest at all.  She is having to take Motrin 800 mg two to three per day, Flexeril 10 mg at night, and also has tried Percocet, which she had leftover from hysterectomy and it is giving her some temporary relief.   REVIEW OF SYSTEMS:                                    Detailed review of systems sheet was reviewed with the patient.  Under cardiovascular, she notes high blood pressure.  Under genitourinary, she notes kidney stones in 2001.  Musculoskeletal, she notes left arm pain and left-sided neck pain.   PAST MEDICAL HISTORY:            Current Medical Conditions:  She has a history of hypertension.            Prior Operations:  Significant for hysterectomy in 02/2010, tonsillectomy in 1960, LASIK surgery in 1998.            Medications and Allergies:  Current medications additionally include Toprol-XL 25 mg daily, HCTZ 12.5 mg daily, Premarin 0.625 mg daily, calcium 500 mg daily, vitamin D 50,000 units twice per month, multivitamin daily, Motrin 800 mg two to three per day, Flexeril 10 mg as needed, glucosamine and chondroitin one, two to three times weekly, and Percocet as needed for supplemental pain relief.  SHE NOTES THE CODEINE CAUSES ITCH.   FAMILY HISTORY:                                            Her mother died at age 14 of lung cancer, hypertension, coronary artery bypass and had spinal stenosis.  Father died at age 96 after two myocardial infarctions.   SOCIAL HISTORY:                                             She denies tobacco or drug use.  She is a social drinker of alcoholic beverages.   PHYSICAL EXAMINATION:                                She is 5 feet 7 inches tall and 138 pounds.  On examination today, Debra Nguyen is a pleasant and cooperative woman, in no acute distress.  Her blood pressure is 136/82, heart rate is 72 and regular, respiratory rate is 18.            HEENT - normocephalic, atraumatic.            Neck - She does  have left peri scapular discomfort to palpation.  She has significant positive left Spurling maneuver.            Respiratory - Chest clear to auscultation without rales or rhonchi.            Cardiovascular - Heart regular rate and rhythm without murmur.            Abdomen - Soft, active bowel sounds, no hepatosplenomegaly appreciated.            Extremities - No edema, clubbing, or cyanosis in the lower extremities.  Intact pedal pulses.            Musculoskeletal Examination - She does not appear to have significant tenderness about her shoulders.  She has decreased range of motion turning her head to the left.   NEUROLOGICAL EXAMINATION:                       She is awake, alert, and fully oriented.  She speaks with clear and fluent speech, has intact short- and long-term memory.            Cranial Nerve Examination - Pupils are equal, round, reactive to light.  Extraocular movements are intact.  Facial sensation and facial motor are intact and symmetric.  Hearing is intact to finger rub.  Palate elevates, tongue protrudes in the midline.  Shoulder shrug is symmetric.            Motor Examination - Upper extremity strength is full in all motor groups and bilaterally symmetric with the exception of left biceps with 4/5, left triceps with 4-/5, left wrist flexion at 4/5, and left wrist extension at 4+/5.            Sensory Examination - She has decreased sensation in left second and third digits.            Deep Tendon Reflexes -  2 in the  biceps, triceps, and brachioradialis, 2 at the knees, and 2 at the ankles.  The toes are downgoing to plantar stimulation.            Cerebellar Examination - Cerebellar testing is normal.   DIAGNOSTIC STUDIES:                                    I reviewed an MRI of the cervical spine, which shows that she has significant spondylosis and osteophytic disc uncinate hypertrophy and osteophytic disc protrusion at C5-6 on the left and C6-7 on the left with some right-sided foraminal stenosis at C6-7 as well.  She was noted to have some significant degeneration at C4-5 on the left by the radiologist, but to my review, I do not believe this is severe and I believe that she has normal foraminal height at this level.   IMPRESSION/RECOMMENDATIONS:                I have recommended, based on the severity of her pain, weakness, and significant imaging finding, Debra Nguyen undergo surgery.  This would consist of anterior cervical decompression and fusion at C5-6 and C6-7 levels.  She wished to go ahead with surgery.  I will plan on doing this in the near future and I am tentatively planning 07/19/2012 for surgery.  Risks and benefits were discussed in detail with the patient.  She wished to proceed.  Arlys John, the nurse, met with her  and discussed the details of the surgery.  We went over models and educational materials.  She wishes to proceed.       __________________________________ Danae Orleans Venetia Maxon, MD

## 2012-07-19 ENCOUNTER — Encounter (HOSPITAL_COMMUNITY): Payer: Self-pay | Admitting: Anesthesiology

## 2012-07-19 ENCOUNTER — Ambulatory Visit (HOSPITAL_COMMUNITY): Payer: 59

## 2012-07-19 ENCOUNTER — Encounter (HOSPITAL_COMMUNITY): Payer: Self-pay | Admitting: *Deleted

## 2012-07-19 ENCOUNTER — Encounter (HOSPITAL_COMMUNITY)
Admission: RE | Disposition: A | Discharge status: Home / Self Care | Payer: Self-pay | Source: Ambulatory Visit | Attending: Neurosurgery

## 2012-07-19 ENCOUNTER — Ambulatory Visit (HOSPITAL_COMMUNITY)
Admission: RE | Admit: 2012-07-19 | Discharge: 2012-07-20 | Disposition: A | Discharge status: Home / Self Care | Payer: 59 | Source: Ambulatory Visit | Attending: Neurosurgery | Admitting: Neurosurgery

## 2012-07-19 ENCOUNTER — Ambulatory Visit (HOSPITAL_COMMUNITY): Payer: 59 | Admitting: Anesthesiology

## 2012-07-19 DIAGNOSIS — M47812 Spondylosis without myelopathy or radiculopathy, cervical region: Secondary | ICD-10-CM | POA: Insufficient documentation

## 2012-07-19 DIAGNOSIS — M5020 Other cervical disc displacement, unspecified cervical region: Secondary | ICD-10-CM | POA: Insufficient documentation

## 2012-07-19 DIAGNOSIS — I10 Essential (primary) hypertension: Secondary | ICD-10-CM | POA: Insufficient documentation

## 2012-07-19 DIAGNOSIS — Z79899 Other long term (current) drug therapy: Secondary | ICD-10-CM | POA: Insufficient documentation

## 2012-07-19 HISTORY — PX: ANTERIOR CERVICAL DECOMP/DISCECTOMY FUSION: SHX1161

## 2012-07-19 SURGERY — ANTERIOR CERVICAL DECOMPRESSION/DISCECTOMY FUSION 2 LEVELS
Anesthesia: General | Wound class: Clean

## 2012-07-19 MED ORDER — GLYCOPYRROLATE 0.2 MG/ML IJ SOLN
INTRAMUSCULAR | Status: DC | PRN
  Administered 2012-07-19: 0.4 mg via INTRAVENOUS

## 2012-07-19 MED ORDER — SODIUM CHLORIDE 0.9 % IJ SOLN: 3.0000 mL | INTRAMUSCULAR | Status: DC | PRN

## 2012-07-19 MED ORDER — NEOSTIGMINE METHYLSULFATE 1 MG/ML IJ SOLN
INTRAMUSCULAR | Status: DC | PRN
  Administered 2012-07-19: 3 mg via INTRAVENOUS

## 2012-07-19 MED ORDER — ONDANSETRON HCL 4 MG/2ML IJ SOLN: 4.0000 mg | Freq: Four times a day (QID) | INTRAMUSCULAR | Status: DC | PRN

## 2012-07-19 MED ORDER — METHOCARBAMOL 500 MG PO TABS
500.0000 mg | ORAL_TABLET | Freq: Every day | ORAL | Status: DC | PRN
  Administered 2012-07-19: 500 mg via ORAL
  Filled 2012-07-19: qty 1

## 2012-07-19 MED ORDER — METOPROLOL SUCCINATE ER 25 MG PO TB24
25.0000 mg | ORAL_TABLET | Freq: Every day | ORAL | Status: DC
  Filled 2012-07-19: qty 1

## 2012-07-19 MED ORDER — HYDROCODONE-ACETAMINOPHEN 5-325 MG PO TABS: 1.0000 | ORAL_TABLET | ORAL | Status: DC | PRN

## 2012-07-19 MED ORDER — LIDOCAINE-EPINEPHRINE 1 %-1:100000 IJ SOLN
INTRAMUSCULAR | Status: DC | PRN
  Administered 2012-07-19: 3.5 mL via INTRADERMAL

## 2012-07-19 MED ORDER — SENNA 8.6 MG PO TABS
1.0000 | ORAL_TABLET | Freq: Two times a day (BID) | ORAL | Status: DC
  Filled 2012-07-19 (×3): qty 1

## 2012-07-19 MED ORDER — ACETAMINOPHEN 10 MG/ML IV SOLN
INTRAVENOUS | Status: AC
  Administered 2012-07-19: 1000 mg via INTRAVENOUS
  Filled 2012-07-19: qty 100

## 2012-07-19 MED ORDER — CEFAZOLIN SODIUM 1-5 GM-% IV SOLN
1.0000 g | Freq: Three times a day (TID) | INTRAVENOUS | Status: AC
  Administered 2012-07-19 (×2): 1 g via INTRAVENOUS
  Filled 2012-07-19 (×2): qty 50

## 2012-07-19 MED ORDER — LACTATED RINGERS IV SOLN
INTRAVENOUS | Status: DC | PRN
  Administered 2012-07-19: 07:00:00 via INTRAVENOUS

## 2012-07-19 MED ORDER — PHENOL 1.4 % MT LIQD: 1.0000 | OROMUCOSAL | Status: DC | PRN

## 2012-07-19 MED ORDER — ONDANSETRON HCL 4 MG/2ML IJ SOLN
INTRAMUSCULAR | Status: DC | PRN
  Administered 2012-07-19: 4 mg via INTRAVENOUS

## 2012-07-19 MED ORDER — ADULT MULTIVITAMIN W/MINERALS CH
1.0000 | ORAL_TABLET | Freq: Every day | ORAL | Status: DC
  Filled 2012-07-19 (×2): qty 1

## 2012-07-19 MED ORDER — OXYCODONE HCL 5 MG PO TABS
ORAL_TABLET | ORAL | Status: AC
  Filled 2012-07-19: qty 1

## 2012-07-19 MED ORDER — KCL IN DEXTROSE-NACL 20-5-0.45 MEQ/L-%-% IV SOLN
INTRAVENOUS | Status: DC
  Filled 2012-07-19 (×3): qty 1000

## 2012-07-19 MED ORDER — BUPIVACAINE HCL (PF) 0.5 % IJ SOLN
INTRAMUSCULAR | Status: DC | PRN
  Administered 2012-07-19: 3.5 mL

## 2012-07-19 MED ORDER — HYDROMORPHONE HCL PF 1 MG/ML IJ SOLN
0.2500 mg | INTRAMUSCULAR | Status: DC | PRN
  Administered 2012-07-19 (×4): 0.5 mg via INTRAVENOUS

## 2012-07-19 MED ORDER — ACETAMINOPHEN 650 MG RE SUPP: 650.0000 mg | RECTAL | Status: DC | PRN

## 2012-07-19 MED ORDER — PROPOFOL 10 MG/ML IV BOLUS
INTRAVENOUS | Status: DC | PRN
Start: 2012-07-19 — End: 2012-07-19
  Administered 2012-07-19: 180 mg via INTRAVENOUS

## 2012-07-19 MED ORDER — HYDROMORPHONE HCL PF 1 MG/ML IJ SOLN
INTRAMUSCULAR | Status: AC
  Filled 2012-07-19: qty 1

## 2012-07-19 MED ORDER — BISACODYL 10 MG RE SUPP: 10.0000 mg | Freq: Every day | RECTAL | Status: DC | PRN

## 2012-07-19 MED ORDER — HYDROCHLOROTHIAZIDE 12.5 MG PO CAPS
12.5000 mg | ORAL_CAPSULE | Freq: Every day | ORAL | Status: DC
  Filled 2012-07-19 (×2): qty 1

## 2012-07-19 MED ORDER — OXYCODONE HCL 5 MG/5ML PO SOLN: 5.0000 mg | Freq: Once | ORAL | Status: AC | PRN

## 2012-07-19 MED ORDER — MIDAZOLAM HCL 5 MG/5ML IJ SOLN
INTRAMUSCULAR | Status: DC | PRN
  Administered 2012-07-19: 2 mg via INTRAVENOUS

## 2012-07-19 MED ORDER — THERA M PLUS PO TABS: 1.0000 | ORAL_TABLET | Freq: Every day | ORAL | Status: DC

## 2012-07-19 MED ORDER — ESTROGENS CONJUGATED 0.625 MG PO TABS
0.6250 mg | ORAL_TABLET | Freq: Every day | ORAL | Status: DC
  Filled 2012-07-19 (×2): qty 1

## 2012-07-19 MED ORDER — METHOCARBAMOL 500 MG PO TABS
500.0000 mg | ORAL_TABLET | Freq: Four times a day (QID) | ORAL | Status: DC | PRN
  Administered 2012-07-19 – 2012-07-20 (×2): 500 mg via ORAL
  Filled 2012-07-19 (×2): qty 1

## 2012-07-19 MED ORDER — 0.9 % SODIUM CHLORIDE (POUR BTL) OPTIME
TOPICAL | Status: DC | PRN
  Administered 2012-07-19: 1000 mL

## 2012-07-19 MED ORDER — OXYCODONE-ACETAMINOPHEN 5-325 MG PO TABS
1.0000 | ORAL_TABLET | ORAL | Status: DC | PRN
  Administered 2012-07-19: 2 via ORAL
  Filled 2012-07-19: qty 2

## 2012-07-19 MED ORDER — SODIUM CHLORIDE 0.9 % IV SOLN: 250.0000 mL | INTRAVENOUS | Status: DC

## 2012-07-19 MED ORDER — ROCURONIUM BROMIDE 100 MG/10ML IV SOLN
INTRAVENOUS | Status: DC | PRN
  Administered 2012-07-19: 50 mg via INTRAVENOUS

## 2012-07-19 MED ORDER — THROMBIN 5000 UNITS EX SOLR
CUTANEOUS | Status: DC | PRN
  Administered 2012-07-19 (×2): 5000 [IU] via TOPICAL

## 2012-07-19 MED ORDER — SODIUM CHLORIDE 0.9 % IJ SOLN: 3.0000 mL | Freq: Two times a day (BID) | INTRAMUSCULAR | Status: DC

## 2012-07-19 MED ORDER — FLEET ENEMA 7-19 GM/118ML RE ENEM: 1.0000 | ENEMA | Freq: Once | RECTAL | Status: AC | PRN

## 2012-07-19 MED ORDER — VITAMIN D (ERGOCALCIFEROL) 1.25 MG (50000 UNIT) PO CAPS: 50000.0000 [IU] | ORAL_CAPSULE | ORAL | Status: DC

## 2012-07-19 MED ORDER — OXYCODONE HCL 5 MG PO TABS
5.0000 mg | ORAL_TABLET | Freq: Once | ORAL | Status: AC | PRN
  Administered 2012-07-19: 5 mg via ORAL

## 2012-07-19 MED ORDER — ARTIFICIAL TEARS OP OINT
TOPICAL_OINTMENT | OPHTHALMIC | Status: DC | PRN
  Administered 2012-07-19: 1 via OPHTHALMIC

## 2012-07-19 MED ORDER — SENNOSIDES-DOCUSATE SODIUM 8.6-50 MG PO TABS
1.0000 | ORAL_TABLET | Freq: Every evening | ORAL | Status: DC | PRN
  Filled 2012-07-19: qty 1

## 2012-07-19 MED ORDER — PROMETHAZINE HCL 25 MG PO TABS
12.5000 mg | ORAL_TABLET | Freq: Four times a day (QID) | ORAL | Status: DC | PRN
  Administered 2012-07-19: 25 mg via ORAL
  Filled 2012-07-19: qty 1

## 2012-07-19 MED ORDER — DEXAMETHASONE SODIUM PHOSPHATE 4 MG/ML IJ SOLN
INTRAMUSCULAR | Status: DC | PRN
  Administered 2012-07-19: 10 mg via INTRAVENOUS

## 2012-07-19 MED ORDER — ALUM & MAG HYDROXIDE-SIMETH 200-200-20 MG/5ML PO SUSP: 30.0000 mL | Freq: Four times a day (QID) | ORAL | Status: DC | PRN

## 2012-07-19 MED ORDER — MENTHOL 3 MG MT LOZG: 1.0000 | LOZENGE | OROMUCOSAL | Status: DC | PRN

## 2012-07-19 MED ORDER — METHOCARBAMOL 500 MG PO TABS
ORAL_TABLET | ORAL | Status: AC
  Administered 2012-07-19: 500 mg
  Filled 2012-07-19: qty 1

## 2012-07-19 MED ORDER — DOCUSATE SODIUM 100 MG PO CAPS
100.0000 mg | ORAL_CAPSULE | Freq: Two times a day (BID) | ORAL | Status: DC
  Filled 2012-07-19: qty 1

## 2012-07-19 MED ORDER — HEMOSTATIC AGENTS (NO CHARGE) OPTIME
TOPICAL | Status: DC | PRN
  Administered 2012-07-19: 1 via TOPICAL

## 2012-07-19 MED ORDER — FENTANYL CITRATE 0.05 MG/ML IJ SOLN
INTRAMUSCULAR | Status: DC | PRN
  Administered 2012-07-19 (×2): 100 ug via INTRAVENOUS
  Administered 2012-07-19: 50 ug via INTRAVENOUS

## 2012-07-19 MED ORDER — MORPHINE SULFATE 2 MG/ML IJ SOLN: 1.0000 mg | INTRAMUSCULAR | Status: DC | PRN

## 2012-07-19 MED ORDER — ACETAMINOPHEN 325 MG PO TABS: 650.0000 mg | ORAL_TABLET | ORAL | Status: DC | PRN

## 2012-07-19 MED ORDER — ONDANSETRON HCL 4 MG/2ML IJ SOLN: 4.0000 mg | INTRAMUSCULAR | Status: DC | PRN

## 2012-07-19 MED ORDER — ZOLPIDEM TARTRATE 5 MG PO TABS: 5.0000 mg | ORAL_TABLET | Freq: Every evening | ORAL | Status: DC | PRN

## 2012-07-19 MED ORDER — LIDOCAINE HCL (CARDIAC) 20 MG/ML IV SOLN
INTRAVENOUS | Status: DC | PRN
  Administered 2012-07-19: 80 mg via INTRAVENOUS

## 2012-07-19 SURGICAL SUPPLY — 67 items
ADH SKN CLS APL DERMABOND .7 (GAUZE/BANDAGES/DRESSINGS) ×1
APL SKNCLS STERI-STRIP NONHPOA (GAUZE/BANDAGES/DRESSINGS)
BAG DECANTER FOR FLEXI CONT (MISCELLANEOUS) ×2 IMPLANT
BANDAGE GAUZE ELAST BULKY 4 IN (GAUZE/BANDAGES/DRESSINGS) IMPLANT
BENZOIN TINCTURE PRP APPL 2/3 (GAUZE/BANDAGES/DRESSINGS) IMPLANT
BIT DRILL 2.3 12 FIXED (INSTRUMENTS) ×1 IMPLANT
BIT DRILL NEURO 2X3.1 SFT TUCH (MISCELLANEOUS) ×1 IMPLANT
BLADE ULTRA TIP 2M (BLADE) IMPLANT
BUR BARREL STRAIGHT FLUTE 4.0 (BURR) ×2 IMPLANT
CANISTER SUCTION 2500CC (MISCELLANEOUS) ×2 IMPLANT
CLOTH BEACON ORANGE TIMEOUT ST (SAFETY) ×2 IMPLANT
CONT SPEC 4OZ CLIKSEAL STRL BL (MISCELLANEOUS) ×2 IMPLANT
COVER MAYO STAND STRL (DRAPES) ×2 IMPLANT
DERMABOND ADVANCED (GAUZE/BANDAGES/DRESSINGS) ×1
DERMABOND ADVANCED .7 DNX12 (GAUZE/BANDAGES/DRESSINGS) ×1 IMPLANT
DRAPE LAPAROTOMY 100X72 PEDS (DRAPES) ×2 IMPLANT
DRAPE MICROSCOPE LEICA (MISCELLANEOUS) ×2 IMPLANT
DRAPE POUCH INSTRU U-SHP 10X18 (DRAPES) ×2 IMPLANT
DRAPE PROXIMA HALF (DRAPES) IMPLANT
DRESSING TELFA 8X3 (GAUZE/BANDAGES/DRESSINGS) IMPLANT
DRILL 12MM (INSTRUMENTS) ×2
DRILL NEURO 2X3.1 SOFT TOUCH (MISCELLANEOUS) ×2
DURAPREP 6ML APPLICATOR 50/CS (WOUND CARE) ×2 IMPLANT
ELECT COATED BLADE 2.86 ST (ELECTRODE) ×2 IMPLANT
ELECT REM PT RETURN 9FT ADLT (ELECTROSURGICAL) ×2
ELECTRODE REM PT RTRN 9FT ADLT (ELECTROSURGICAL) ×1 IMPLANT
GAUZE SPONGE 4X4 16PLY XRAY LF (GAUZE/BANDAGES/DRESSINGS) IMPLANT
GLOVE BIO SURGEON STRL SZ8 (GLOVE) ×2 IMPLANT
GLOVE BIOGEL PI IND STRL 8 (GLOVE) ×1 IMPLANT
GLOVE BIOGEL PI IND STRL 8.5 (GLOVE) ×2 IMPLANT
GLOVE BIOGEL PI INDICATOR 8 (GLOVE) ×1
GLOVE BIOGEL PI INDICATOR 8.5 (GLOVE) ×2
GLOVE ECLIPSE 7.5 STRL STRAW (GLOVE) ×4 IMPLANT
GLOVE ECLIPSE 8.0 STRL XLNG CF (GLOVE) ×2 IMPLANT
GLOVE EXAM NITRILE LRG STRL (GLOVE) IMPLANT
GLOVE EXAM NITRILE MD LF STRL (GLOVE) ×2 IMPLANT
GLOVE EXAM NITRILE XL STR (GLOVE) IMPLANT
GLOVE EXAM NITRILE XS STR PU (GLOVE) IMPLANT
GOWN BRE IMP SLV AUR LG STRL (GOWN DISPOSABLE) IMPLANT
GOWN BRE IMP SLV AUR XL STRL (GOWN DISPOSABLE) ×4 IMPLANT
GOWN STRL REIN 2XL LVL4 (GOWN DISPOSABLE) ×2 IMPLANT
HEAD HALTER (SOFTGOODS) ×2 IMPLANT
KIT BASIN OR (CUSTOM PROCEDURE TRAY) ×2 IMPLANT
KIT ROOM TURNOVER OR (KITS) ×2 IMPLANT
NEEDLE HYPO 18GX1.5 BLUNT FILL (NEEDLE) ×2 IMPLANT
NEEDLE HYPO 25X1 1.5 SAFETY (NEEDLE) ×2 IMPLANT
NEEDLE SPNL 22GX3.5 QUINCKE BK (NEEDLE) ×2 IMPLANT
NS IRRIG 1000ML POUR BTL (IV SOLUTION) ×2 IMPLANT
PACK LAMINECTOMY NEURO (CUSTOM PROCEDURE TRAY) ×2 IMPLANT
PAD ARMBOARD 7.5X6 YLW CONV (MISCELLANEOUS) ×2 IMPLANT
PIN DISTRACTION 14MM (PIN) ×4 IMPLANT
PLATE 32MM (Plate) ×2 IMPLANT
RUBBERBAND STERILE (MISCELLANEOUS) ×4 IMPLANT
SCREW 12MM (Screw) ×8 IMPLANT
SPACER ASSEM CERV LORD 6M (Spacer) ×4 IMPLANT
SPONGE GAUZE 4X4 12PLY (GAUZE/BANDAGES/DRESSINGS) IMPLANT
SPONGE INTESTINAL PEANUT (DISPOSABLE) ×2 IMPLANT
SPONGE SURGIFOAM ABS GEL SZ50 (HEMOSTASIS) ×2 IMPLANT
STAPLER SKIN PROX WIDE 3.9 (STAPLE) IMPLANT
STRIP CLOSURE SKIN 1/2X4 (GAUZE/BANDAGES/DRESSINGS) IMPLANT
SUT VIC AB 3-0 SH 8-18 (SUTURE) ×4 IMPLANT
SYR 20ML ECCENTRIC (SYRINGE) ×2 IMPLANT
SYR 3ML LL SCALE MARK (SYRINGE) ×2 IMPLANT
TOWEL OR 17X24 6PK STRL BLUE (TOWEL DISPOSABLE) ×2 IMPLANT
TOWEL OR 17X26 10 PK STRL BLUE (TOWEL DISPOSABLE) ×2 IMPLANT
TRAP SPECIMEN MUCOUS 40CC (MISCELLANEOUS) IMPLANT
WATER STERILE IRR 1000ML POUR (IV SOLUTION) ×2 IMPLANT

## 2012-07-19 NOTE — Anesthesia Procedure Notes (Signed)
Procedure Name: Intubation Date/Time: 07/19/2012 7:26 AM Performed by: Tyrone Nine Pre-anesthesia Checklist: Patient identified, Timeout performed, Emergency Drugs available, Suction available and Patient being monitored Patient Re-evaluated:Patient Re-evaluated prior to inductionOxygen Delivery Method: Circle system utilized Preoxygenation: Pre-oxygenation with 100% oxygen Intubation Type: IV induction Ventilation: Mask ventilation without difficulty Laryngoscope Size: Mac and 3 Grade View: Grade I Tube type: Oral Tube size: 7.5 mm Number of attempts: 1 Airway Equipment and Method: Stylet Placement Confirmation: ETT inserted through vocal cords under direct vision,  positive ETCO2,  CO2 detector and breath sounds checked- equal and bilateral Secured at: 21 cm Tube secured with: Tape Dental Injury: Teeth and Oropharynx as per pre-operative assessment

## 2012-07-19 NOTE — Op Note (Signed)
07/19/2012  9:34 AM  PATIENT:  Debra Nguyen  57 y.o. female  PRE-OPERATIVE DIAGNOSIS:  Cervical spondylosis, Stenosis, Radiculopathy, cervical disc herniation C 56 and C 67  POST-OPERATIVE DIAGNOSIS:  Cervical spondylosis, Stenosis, Radiculopathy, cervical disc herniation C 56 and C 67  PROCEDURE:  Procedure(s) with comments: ANTERIOR CERVICAL DECOMPRESSION/DISCECTOMY FUSION 2 LEVELS (N/A) - Anterior Cervical Five-Six Cervical Six-Seven Anterior Cervical Decompression/Diskectomy/Fusion with allograft/autograft and anterior cervical plate  SURGEON:  Surgeon(s) and Role:    * Kellan Raffield, MD - Primary    * Randy O Kritzer, MD - Assisting  PHYSICIAN ASSISTANT:   ASSISTANTS: Poteat, RN   ANESTHESIA:   general  EBL:  Total I/O In: 1100 [I.V.:1100] Out: -   BLOOD ADMINISTERED:none  DRAINS: none   LOCAL MEDICATIONS USED:  MARCAINE     SPECIMEN:  No Specimen  DISPOSITION OF SPECIMEN:  N/A  COUNTS:  YES  TOURNIQUET:  * No tourniquets in log *  DICTATION: DICTATION: Patient is 57 year old female with left arm pain and weakness with HNP, spondylosis, radiculopathy C5/6 and C6/7  PROCEDURE: Patient was brought to operating room and following the smooth and uncomplicated induction of general endotracheal anesthesia her head was placed on a horseshoe head holder she was placed in 5 pounds of Holter traction and her anterior neck was prepped and draped in usual sterile fashion. An incision was made on the left side of midline after infiltrating the skin and subcutaneous tissues with local lidocaine. The platysmal layer was incised and subplatysmal dissection was performed exposing the anterior border sternocleidomastoid muscle. Using blunt dissection the carotid sheath was kept lateral and trachea and esophagus kept medial exposing the anterior cervical spine. A bent spinal needle was placed it was felt to be the C5/6 level and this was confirmed on intraoperative x-ray. Longus coli  muscles were taken down from the anterior cervical spine using electrocautery and key elevator and self-retaining retractor was placed exposing the C5/6 and C6/7 levels. The interspaces were incised and a thorough discectomy was performed. Distraction pins were placed. Initially the C6/7 level was operated. Uncinate spurs and central spondylitic ridges were drilled down with a high-speed drill. The spinal cord dura and both C7 nerve roots were widely decompressed. A large amount of herniated disc material was removed from overlying the take-off of the left C 7 nerve root. Hemostasis was assured. After trial sizing a 6 mm machined allograft bone wedge was selected and packed with autograft. This was tamped into position and countersunk appropriately. Attention was the paid to the C5/6 level, where similar decompression was performed.  Uncinate spurs and central spondylitic ridges were drilled down with a high-speed drill. The spinal cord dura and both C6 nerve roots were widely decompressed. Again, disc material was removed which was compressing the left C 6 nerve root. Hemostasis was assured. After trial sizing a similarly sized graft was selected and packed with autograft. This was tamped into position and countersunk appropriately.Distraction weight was removed. A 32 mm trestle luxe anterior cervical plate was affixed to the cervical spine with 12 mm variable-angle screws 2 at C5, 2 at C6 and 2 at C7. All screws were well-positioned and locking mechanisms were engaged. A final X ray was obtained which showed well positioned grafts and anterior plate without complicating features. Soft tissues were inspected and found to be in good repair. The wound was irrigated. The platysma layer was closed with 3-0 Vicryl stitches and the skin was reapproximated with 3-0 Vicryl   subcuticular stitches. The wound was dressed with Dermabond. Counts were correct at the end of the case. Patient was extubated and taken to recovery in  stable and satisfactory condition.    PLAN OF CARE: Admit for overnight observation  PATIENT DISPOSITION:  PACU - hemodynamically stable.   Delay start of Pharmacological VTE agent (>24hrs) due to surgical blood loss or risk of bleeding: yes  

## 2012-07-19 NOTE — Interval H&P Note (Signed)
History and Physical Interval Note:  07/19/2012 6:24 AM  Debra Nguyen  has presented today for surgery, with the diagnosis of Cervical spondylosis, Stenosis, Radiculopathy, Cervicalgia  The various methods of treatment have been discussed with the patient and family. After consideration of risks, benefits and other options for treatment, the patient has consented to  Procedure(s) with comments: ANTERIOR CERVICAL DECOMPRESSION/DISCECTOMY FUSION 2 LEVELS (N/A) - C5-6 C6-7 Anterior cervical decompression/diskectomy/fusion as a surgical intervention .  The patient's history has been reviewed, patient examined, no change in status, stable for surgery.  I have reviewed the patient's chart and labs.  Questions were answered to the patient's satisfaction.     [name]

## 2012-07-19 NOTE — Transfer of Care (Signed)
Immediate Anesthesia Transfer of Care Note  Patient: Debra Nguyen  Procedure(s) Performed: Procedure(s) with comments: ANTERIOR CERVICAL DECOMPRESSION/DISCECTOMY FUSION 2 LEVELS (N/A) - Anterior Cervical Five-Six Cervical Six-Seven Anterior Cervical Decompression/Diskectomy/Fusion  Patient Location: PACU  Anesthesia Type:General  Level of Consciousness: awake, alert , oriented and patient cooperative  Airway & Oxygen Therapy: Patient Spontanous Breathing and Patient connected to nasal cannula oxygen  Post-op Assessment: Report given to PACU RN and Post -op Vital signs reviewed and stable  Post vital signs: Reviewed and stable  Complications: No apparent anesthesia complications

## 2012-07-19 NOTE — Preoperative (Addendum)
Beta Blockers   Toprol 50 mg taken on 07-19-12 @ 0300

## 2012-07-19 NOTE — Anesthesia Postprocedure Evaluation (Signed)
  Anesthesia Post-op Note  Patient: Debra Nguyen  Procedure(s) Performed: Procedure(s) with comments: ANTERIOR CERVICAL DECOMPRESSION/DISCECTOMY FUSION 2 LEVELS (N/A) - Anterior Cervical Five-Six Cervical Six-Seven Anterior Cervical Decompression/Diskectomy/Fusion  Patient Location: PACU and Short Stay  Anesthesia Type:General  Level of Consciousness: awake, alert , oriented and pateint uncooperative  Airway and Oxygen Therapy: Patient Spontanous Breathing  Post-op Pain: mild  Post-op Assessment: Post-op Vital signs reviewed, Patient's Cardiovascular Status Stable and Respiratory Function Stable  Post-op Vital Signs: Reviewed and stable  Complications: No apparent anesthesia complications

## 2012-07-19 NOTE — Brief Op Note (Signed)
07/19/2012  9:34 AM  PATIENT:  Debra Nguyen  57 y.o. female  PRE-OPERATIVE DIAGNOSIS:  Cervical spondylosis, Stenosis, Radiculopathy, cervical disc herniation C 56 and C 67  POST-OPERATIVE DIAGNOSIS:  Cervical spondylosis, Stenosis, Radiculopathy, cervical disc herniation C 56 and C 67  PROCEDURE:  Procedure(s) with comments: ANTERIOR CERVICAL DECOMPRESSION/DISCECTOMY FUSION 2 LEVELS (N/A) - Anterior Cervical Five-Six Cervical Six-Seven Anterior Cervical Decompression/Diskectomy/Fusion with allograft/autograft and anterior cervical plate  SURGEON:  Surgeon(s) and Role:    * Maeola Harman, MD - Primary    * Reinaldo Meeker, MD - Assisting  PHYSICIAN ASSISTANT:   ASSISTANTS: Poteat, RN   ANESTHESIA:   general  EBL:  Total I/O In: 1100 [I.V.:1100] Out: -   BLOOD ADMINISTERED:none  DRAINS: none   LOCAL MEDICATIONS USED:  MARCAINE     SPECIMEN:  No Specimen  DISPOSITION OF SPECIMEN:  N/A  COUNTS:  YES  TOURNIQUET:  * No tourniquets in log *  DICTATION: DICTATION: Patient is 57 year old female with left arm pain and weakness with HNP, spondylosis, radiculopathy C5/6 and C6/7  PROCEDURE: Patient was brought to operating room and following the smooth and uncomplicated induction of general endotracheal anesthesia her head was placed on a horseshoe head holder she was placed in 5 pounds of Holter traction and her anterior neck was prepped and draped in usual sterile fashion. An incision was made on the left side of midline after infiltrating the skin and subcutaneous tissues with local lidocaine. The platysmal layer was incised and subplatysmal dissection was performed exposing the anterior border sternocleidomastoid muscle. Using blunt dissection the carotid sheath was kept lateral and trachea and esophagus kept medial exposing the anterior cervical spine. A bent spinal needle was placed it was felt to be the C5/6 level and this was confirmed on intraoperative x-ray. Longus coli  muscles were taken down from the anterior cervical spine using electrocautery and key elevator and self-retaining retractor was placed exposing the C5/6 and C6/7 levels. The interspaces were incised and a thorough discectomy was performed. Distraction pins were placed. Initially the C6/7 level was operated. Uncinate spurs and central spondylitic ridges were drilled down with a high-speed drill. The spinal cord dura and both C7 nerve roots were widely decompressed. A large amount of herniated disc material was removed from overlying the take-off of the left C 7 nerve root. Hemostasis was assured. After trial sizing a 6 mm machined allograft bone wedge was selected and packed with autograft. This was tamped into position and countersunk appropriately. Attention was the paid to the C5/6 level, where similar decompression was performed.  Uncinate spurs and central spondylitic ridges were drilled down with a high-speed drill. The spinal cord dura and both C6 nerve roots were widely decompressed. Again, disc material was removed which was compressing the left C 6 nerve root. Hemostasis was assured. After trial sizing a similarly sized graft was selected and packed with autograft. This was tamped into position and countersunk appropriately.Distraction weight was removed. A 32 mm trestle luxe anterior cervical plate was affixed to the cervical spine with 12 mm variable-angle screws 2 at C5, 2 at C6 and 2 at C7. All screws were well-positioned and locking mechanisms were engaged. A final X ray was obtained which showed well positioned grafts and anterior plate without complicating features. Soft tissues were inspected and found to be in good repair. The wound was irrigated. The platysma layer was closed with 3-0 Vicryl stitches and the skin was reapproximated with 3-0 Vicryl  subcuticular stitches. The wound was dressed with Dermabond. Counts were correct at the end of the case. Patient was extubated and taken to recovery in  stable and satisfactory condition.    PLAN OF CARE: Admit for overnight observation  PATIENT DISPOSITION:  PACU - hemodynamically stable.   Delay start of Pharmacological VTE agent (>24hrs) due to surgical blood loss or risk of bleeding: yes

## 2012-07-19 NOTE — Progress Notes (Signed)
Subjective: Patient reports "I feel good. Just sore in my shoulders"  Objective: Vital signs in last 24 hours: Temp:  [97.2 F (36.2 C)-97.7 F (36.5 C)] 97.7 F (36.5 C) (06/03 1104) Pulse Rate:  [65-95] 73 (06/03 1620) Resp:  [16-32] 16 (06/03 1620) BP: (122-156)/(65-96) 124/76 mmHg (06/03 1620) SpO2:  [96 %-100 %] 97 % (06/03 1620)  Intake/Output from previous day:   Intake/Output this shift: Total I/O In: 1300 [I.V.:1300] Out: -   Alert, conversant. Good strength BUE. Incision with Dermabond. No swelling, erythema, or drainage. No arm pain, only soreness across trapezeii. Reassured. Pt notes some nausea and poor appetite, but taking liquids without difficulty.  Lab Results: No results found for this basename: WBC, HGB, HCT, PLT,  in the last 72 hours BMET No results found for this basename: NA, K, CL, CO2, GLUCOSE, BUN, CREATININE, CALCIUM,  in the last 72 hours  Studies/Results: Dg Cervical Spine 2-3 Views  07/19/2012   *RADIOLOGY REPORT*  Clinical Data: Anterior cervical spine fusion  CERVICAL SPINE - 2-3 VIEW  Comparison: Cervical spine films of 06/29/2012  Findings:  The initial portable cross-table lateral view of the cervical spine shows a needle positioned anteriorly for localization of the C5-6 level.  Portable cross-table lateral view of the cervical spine labeled #2 shows anterior fusion plate from W0-J8 in good alignment.  The C6-7 and C7-C1 levels are not well seen on this portable view.  An endotracheal tube is noted.  IMPRESSION: Anterior fusion from C5-C7 with grossly normal alignment maintained.  The lower cervical spine is not well visualized.   Original Report Authenticated By: Dwyane Dee, M.D.    Assessment/Plan: Improving   LOS: 0 days  Per Dr. Venetia Maxon, ok for Phenergan 12.5-25mg  po q6hrs prn N/V as Zofran has not helped in past. Mobilize in collar.    Georgiann Cocker 07/19/2012, 5:20 PM

## 2012-07-19 NOTE — Progress Notes (Signed)
Awake, alert, conversant.  Full strength bilateral B/T.  MAEW.  Doing well.

## 2012-07-19 NOTE — Anesthesia Preprocedure Evaluation (Addendum)
Anesthesia Evaluation  Patient identified by MRN, date of birth, ID band Patient awake    Reviewed: Allergy & Precautions, H&P , NPO status , Patient's Chart, lab work & pertinent test results  Airway Mallampati: II TM Distance: >3 FB Neck ROM: full    Dental  (+) Teeth Intact and Dental Advisory Given   Pulmonary  06-14-12 Chest x-ray Findings: Mild hyperinflation. The heart size and pulmonary vascularity are normal. The lungs appear clear and expanded without focal air space disease or consolidation. No blunting of the costophrenic angles.  No pneumothorax.  Mediastinal contours appear intact.   IMPRESSION: No evidence of active pulmonary disease.       Pulmonary exam normal       Cardiovascular Exercise Tolerance: Good hypertension, Pt. on medications and Pt. on home beta blockers Rhythm:Regular Rate:Normal     Neuro/Psych Anxiety    GI/Hepatic negative GI ROS, Neg liver ROS,   Endo/Other  negative endocrine ROS  Renal/GU negative Renal ROS     Musculoskeletal negative musculoskeletal ROS (+) Arthritis -,   Abdominal   Peds  Hematology   Anesthesia Other Findings Upper lateral Implants  Reproductive/Obstetrics                      Anesthesia Physical Anesthesia Plan  ASA: II  Anesthesia Plan: General   Post-op Pain Management:    Induction: Intravenous  Airway Management Planned: Oral ETT  Additional Equipment:   Intra-op Plan:   Post-operative Plan: Extubation in OR  Informed Consent: I have reviewed the patients History and Physical, chart, labs and discussed the procedure including the risks, benefits and alternatives for the proposed anesthesia with the patient or authorized representative who has indicated his/her understanding and acceptance.   Dental advisory given  Plan Discussed with: Anesthesiologist, CRNA and Surgeon  Anesthesia Plan Comments:         Anesthesia Quick Evaluation

## 2012-07-20 NOTE — Discharge Summary (Signed)
Physician Discharge Summary  Patient ID: Debra Nguyen MRN: [id number] DOB/AGE: [birth year] 57 y.o.  Admit date: 07/19/2012 Discharge date: 07/20/2012  Admission Diagnoses: Cervical spondylosis, Stenosis, Radiculopathy, cervical disc herniation C 56 and C 67    Discharge Diagnoses: Cervical spondylosis, Stenosis, Radiculopathy, cervical disc herniation C 56 and C 67 s/p ANTERIOR CERVICAL DECOMPRESSION/DISCECTOMY FUSION 2 LEVELS (N/A) - Anterior Cervical Five-Six Cervical Six-Seven Anterior Cervical Decompression/Diskectomy/Fusion with allograft/autograft and anterior cervical plate   Active Problems:   * No active hospital problems. *   Discharged Condition: good  Hospital Course: Debra Nguyen was admitted for surgery with Dx cervical spondylosis and stenosis with radiculopathy.  Following uncomplicated ACDF C56, C67, she recovered nicely in Neuro PACU & transferred to 3500 for observation. She has progressed well.  Consults: None  Significant Diagnostic Studies: radiology: X-Ray: intra-operative  Treatments: surgery: ANTERIOR CERVICAL DECOMPRESSION/DISCECTOMY FUSION 2 LEVELS (N/A) - Anterior Cervical Five-Six Cervical Six-Seven Anterior Cervical Decompression/Diskectomy/Fusion with allograft/autograft and anterior cervical plate    Discharge Exam: Blood pressure 147/79, pulse 61, temperature 98.7 F (37.1 C), temperature source Tympanic, resp. rate 16, last menstrual period 02/02/2011, SpO2 99.00%. Doing well. Full strength BUE. No numbness. Incision with Dermabond. Soft Collar.   Disposition: 01-Home or Self CarePt will call to schedule appt in office.   Future Appointments Provider Department Dept Phone   12/30/2012 1:15 PM Alison Murray, MD [facility] [phone]       Medication List    TAKE these medications       CALTRATE 600+D 600-400 MG-UNIT per tablet  Generic drug:  Calcium Carbonate-Vitamin D  Take 1 tablet by mouth daily.     estrogens (conjugated) 0.625 MG tablet  Commonly known as:  PREMARIN  Take 0.625 mg by mouth daily. Take daily for 21 days then do not take for 7 days.     GLUCOSAMINE CHOND COMPLEX/MSM PO  Take 1 tablet by mouth daily. 1500/1250mg      hydrochlorothiazide 12.5 MG capsule  Commonly known as:  MICROZIDE  Take 12.5 mg by mouth daily.     ibuprofen 200 MG tablet  Commonly known as:  ADVIL,MOTRIN  Take 600 mg by mouth 2 (two) times daily as needed for pain.     methocarbamol 500 MG tablet  Commonly known as:  ROBAXIN  Take 500 mg by mouth daily as needed (muscle spasms.).     metoprolol succinate 50 MG 24 hr tablet  Commonly known as:  TOPROL-XL  Take 25 mg by mouth daily.     multivitamins ther. w/minerals Tabs  Take 1 tablet by mouth daily.     Vitamin D (Ergocalciferol) 50000 UNITS Caps  Commonly known as:  DRISDOL  Take 50,000 Units by mouth every 14 (fourteen) days. The first and fifteenth of the month.         Signed: Georgiann Cocker 07/20/2012, 10:48 AM

## 2012-07-20 NOTE — Progress Notes (Signed)
Pt given D/C instructions with verbal understanding. All questions answered prior to D/C. Pt D/C'd home via wheelchair @ 0820 per MD order. Rema Fendt, RN

## 2012-07-20 NOTE — Progress Notes (Signed)
Subjective: Patient reports doing well.  Objective: Vital signs in last 24 hours: Temp:  [97.2 F (36.2 C)-98.1 F (36.7 C)] 98.1 F (36.7 C) (06/04 0400) Pulse Rate:  [58-95] 73 (06/04 0400) Resp:  [16-32] 18 (06/04 0400) BP: (94-156)/(52-96) 126/73 mmHg (06/04 0400) SpO2:  [96 %-100 %] 96 % (06/04 0400)  Intake/Output from previous day: 06/03 0701 - 06/04 0700 In: 1300 [I.V.:1300] Out: -  Intake/Output this shift:    Physical Exam: Full strength B B/T/HI.  No numbness.  Dressing CDI.  Lab Results: No results found for this basename: WBC, HGB, HCT, PLT,  in the last 72 hours BMET No results found for this basename: NA, K, CL, CO2, GLUCOSE, BUN, CREATININE, CALCIUM,  in the last 72 hours  Studies/Results: Dg Cervical Spine 2-3 Views  07/19/2012   *RADIOLOGY REPORT*  Clinical Data: Anterior cervical spine fusion  CERVICAL SPINE - 2-3 VIEW  Comparison: Cervical spine films of 06/29/2012  Findings:  The initial portable cross-table lateral view of the cervical spine shows a needle positioned anteriorly for localization of the C5-6 level.  Portable cross-table lateral view of the cervical spine labeled #2 shows anterior fusion plate from Z6-X0 in good alignment.  The C6-7 and C7-C1 levels are not well seen on this portable view.  An endotracheal tube is noted.  IMPRESSION: Anterior fusion from C5-C7 with grossly normal alignment maintained.  The lower cervical spine is not well visualized.   Original Report Authenticated By: Dwyane Dee, M.D.    Assessment/Plan: D/C home.  F/U 3 weeks.    LOS: 1 day    Dorian Heckle, MD 07/20/2012, 6:02 AM

## 2012-07-21 ENCOUNTER — Encounter (HOSPITAL_COMMUNITY): Payer: Self-pay | Admitting: Neurosurgery

## 2012-11-02 ENCOUNTER — Other Ambulatory Visit: Payer: Self-pay | Admitting: Gynecology

## 2012-11-02 DIAGNOSIS — Z1231 Encounter for screening mammogram for malignant neoplasm of breast: Secondary | ICD-10-CM

## 2012-12-05 ENCOUNTER — Ambulatory Visit (HOSPITAL_COMMUNITY)
Admission: RE | Admit: 2012-12-05 | Discharge: 2012-12-05 | Disposition: A | Discharge status: Home / Self Care | Payer: 59 | Source: Ambulatory Visit | Attending: Gynecology | Admitting: Gynecology

## 2012-12-05 DIAGNOSIS — Z1231 Encounter for screening mammogram for malignant neoplasm of breast: Secondary | ICD-10-CM | POA: Insufficient documentation

## 2012-12-27 ENCOUNTER — Encounter: Payer: Self-pay | Admitting: Obstetrics and Gynecology

## 2012-12-27 DIAGNOSIS — I10 Essential (primary) hypertension: Secondary | ICD-10-CM | POA: Insufficient documentation

## 2012-12-27 DIAGNOSIS — K219 Gastro-esophageal reflux disease without esophagitis: Secondary | ICD-10-CM | POA: Insufficient documentation

## 2012-12-30 ENCOUNTER — Ambulatory Visit: Payer: Self-pay | Admitting: Obstetrics and Gynecology

## 2012-12-30 ENCOUNTER — Encounter: Payer: Self-pay | Admitting: Gynecology

## 2012-12-30 ENCOUNTER — Ambulatory Visit (INDEPENDENT_AMBULATORY_CARE_PROVIDER_SITE_OTHER): Payer: 59 | Admitting: Gynecology

## 2012-12-30 VITALS — BP 122/80 | HR 68 | Resp 16 | Ht 67.0 in | Wt 143.0 lb

## 2012-12-30 DIAGNOSIS — Z8742 Personal history of other diseases of the female genital tract: Secondary | ICD-10-CM

## 2012-12-30 DIAGNOSIS — N951 Menopausal and female climacteric states: Secondary | ICD-10-CM

## 2012-12-30 DIAGNOSIS — E559 Vitamin D deficiency, unspecified: Secondary | ICD-10-CM

## 2012-12-30 DIAGNOSIS — Z01419 Encounter for gynecological examination (general) (routine) without abnormal findings: Secondary | ICD-10-CM

## 2012-12-30 DIAGNOSIS — Z87898 Personal history of other specified conditions: Secondary | ICD-10-CM

## 2012-12-30 MED ORDER — ESTROGENS CONJUGATED 0.625 MG PO TABS: ORAL_TABLET | ORAL | Status: DC

## 2012-12-30 NOTE — Patient Instructions (Signed)

## 2012-12-30 NOTE — Progress Notes (Signed)
57 y.o. Divorced Caucasian female   G2P2002 here for annual exam. Pt reports No Menses had R-TLH  02/23/11 She does report hot flashes, does not have night sweats, does not have vaginal dryness.  She is not using lubricants. She does not report post-menopasual bleeding.  Pt with history of CIN III in 1985, ASCUS, +HPV, LGSIL in 2009, 2010, 2011, 2012, hyst in 2013.  Very happy with premarin and would like to continue at current dose   0Patient's last menstrual period was 02/02/2011.          Sexually active: no  The current method of family planning is Post Menopausal, Hysterectomy  Exercising: yes   Power walking, yoga Last pap:12/17/10 LGSIL(CIN1) Abnormal PAP: 12/17/10 CIN 1,12/06/09 LGSIL + HRHPV 05/30/08 Mammogram: 11/2012 normal BSE: yes Colonoscopy: 2007 normal, repeat in 10 years DEXA: 2010 Heel Scan (normal) Alcohol: 10-12 glasses of wine a week Tobacco: never Hgb PCP   U/A Neg Health Maintenance  Topic Date Due  . Pap Smear  08/01/1971  . Colonoscopy  08/01/2003  . Tetanus/tdap  02/17/2012  . Influenza Vaccine  09/16/2012  . Mammogram  12/01/2013    Family History  Problem Relation Age of Onset  . Hypertension Mother   . Heart disease Mother   . Cancer Mother     lung  . Hypertension Father   . Heart attack Father     Patient Active Problem List   Diagnosis Date Noted  . History of abnormal Pap smear 12/30/2012  . Hypertension   . GERD (gastroesophageal reflux disease)     Past Medical History  Diagnosis Date  . Fibroid 2006    3 cm Right Fundal Fibroid  . ASCUS with positive high risk HPV 12/2006    Negative Colpo BX  . ASCUS with positive high risk HPV 2009  . CIN III (cervical intraepithelial neoplasia III) 07/1983    tx'd w/cryo- no subsequent abnormal paps until 2008  . Hypertension   . GERD (gastroesophageal reflux disease) 2011    Past Surgical History  Procedure Laterality Date  . Vaginal delivery  '92 '94  . Lasik  '99  . Cystoscopy  02/23/2011     PT.STATES SHE DID NOT HAVE  . Anterior cervical decomp/discectomy fusion N/A 07/19/2012    Procedure: ANTERIOR CERVICAL DECOMPRESSION/DISCECTOMY FUSION 2 LEVELS;  Surgeon: Maeola Harman, MD;  Location: [facility];  Service: Neurosurgery;  Laterality: N/A;  Anterior Cervical Five-Six Cervical Six-Seven Anterior Cervical Decompression/Diskectomy/Fusion  . Abdominal hysterectomy  02/23/11    R TLH-fibroids, adenomyosis 221 g, & focal CIN I w/clear margins  . Tonsillectomy and adenoidectomy      Allergies: Codeine and Ultram  Current Outpatient Prescriptions  Medication Sig Dispense Refill  . Calcium Carbonate-Vitamin D (CALTRATE 600+D) 600-400 MG-UNIT per tablet Take 1 tablet by mouth daily.        Marland Kitchen estrogens, conjugated, (PREMARIN) 0.625 MG tablet Take 0.625 mg by mouth daily. Take daily for 21 days then do not take for 7 days.      . hydrochlorothiazide (MICROZIDE) 12.5 MG capsule Take 12.5 mg by mouth daily.        Marland Kitchen ibuprofen (ADVIL,MOTRIN) 200 MG tablet Take 600 mg by mouth 2 (two) times daily as needed for pain.      . methocarbamol (ROBAXIN) 500 MG tablet Take 500 mg by mouth daily as needed (muscle spasms.).      Marland Kitchen metoprolol (TOPROL-XL) 50 MG 24 hr tablet Take 25 mg by mouth daily.       Marland Kitchen  Multiple Vitamins-Minerals (MULTIVITAMINS THER. W/MINERALS) TABS Take 1 tablet by mouth daily.        . Vitamin D, Ergocalciferol, (DRISDOL) 50000 UNITS CAPS Take 50,000 Units by mouth every 14 (fourteen) days. The first and fifteenth of the month.        No current facility-administered medications for this visit.    ROS: Pertinent items are noted in HPI.  Exam:    BP 122/80  Pulse 68  Resp 16  Ht 5\' 7"  (1.702 m)  Wt 143 lb (64.864 kg)  BMI 22.39 kg/m2  LMP 02/02/2011 Weight change: @WEIGHTCHANGE @ Last 3 height recordings:  Ht Readings from Last 3 Encounters:  12/30/12 5\' 7"  (1.702 m)  07/13/12 5\' 7"  (1.702 m)  02/23/11 5\' 7"  (1.702 m)   General appearance: alert, cooperative and  appears stated age Head: Normocephalic, without obvious abnormality, atraumatic Neck: no adenopathy, no carotid bruit, no JVD, supple, symmetrical, trachea midline and thyroid not enlarged, symmetric, no tenderness/mass/nodules Lungs: clear to auscultation bilaterally Breasts: normal appearance, no masses or tenderness Heart: regular rate and rhythm, S1, S2 normal, no murmur, click, rub or gallop Abdomen: soft, non-tender; bowel sounds normal; no masses,  no organomegaly Extremities: extremities normal, atraumatic, no cyanosis or edema Skin: Skin color, texture, turgor normal. No rashes or lesions Lymph nodes: Cervical, supraclavicular, and axillary nodes normal. no inguinal nodes palpated Neurologic: Grossly normal   Pelvic: External genitalia:  no lesions              Urethra: normal appearing urethra with no masses, tenderness or lesions              Bartholins and Skenes: normal                 Vagina: normal appearing vagina with normal color and discharge, no lesions              Cervix: absent              Pap taken: no        Bimanual Exam:  Uterus:  absent                                      Adnexa:    no masses                                      Rectovaginal: Confirms                                      Anus:  normal sphincter tone, no lesions  A: well woman no contraindication to continue hormonal therapy      P: mammogram pap smear guidelines reviewed Refill premarin counseled on breast self exam, mammography screening, adequate intake of calcium and vitamin D, diet and exercise return annually or prn Discussed PAP guideline changes, importance of weight bearing exercises, calcium, vit D and balanced diet.  An After Visit Summary was printed and given to the patient.

## 2013-01-23 ENCOUNTER — Telehealth: Payer: Self-pay | Admitting: *Deleted

## 2013-01-23 NOTE — Telephone Encounter (Signed)
S/W patient she said she is having labs done at the beginning of the year with her PCP, told her if she has them done she can just have them forward it to us. She said she will keep in touch with us and appreciative of the call  

## 2013-01-23 NOTE — Progress Notes (Signed)
S/W patient she said she is having labs done at the beginning of the year with her PCP, told her if she has them done she can just have them forward it to [place]. She said she will keep in touch with [place] and appreciative of the call

## 2013-01-23 NOTE — Telephone Encounter (Signed)
Message copied by Lorraine Lax on Mon Jan 23, 2013 11:30 AM ------      Message from: Douglass Rivers      Created: Sat Jan 21, 2013  3:39 PM       Inform pt that vit D ordered but not done at annual, it looks like she didn't stop by lab, if she wants it, she can just stop by lab if not can be done next yr      ----- Message -----         From: SYSTEM         Sent: 01/04/2013  12:09 AM           To: Bennye Alm, MD                   ------

## 2013-02-15 ENCOUNTER — Other Ambulatory Visit: Payer: 59

## 2013-02-15 ENCOUNTER — Ambulatory Visit (INDEPENDENT_AMBULATORY_CARE_PROVIDER_SITE_OTHER): Payer: 59 | Admitting: *Deleted

## 2013-02-15 DIAGNOSIS — E559 Vitamin D deficiency, unspecified: Secondary | ICD-10-CM

## 2013-02-19 LAB — VITAMIN D 1,25 DIHYDROXY
Vitamin D 1, 25 (OH)2 Total: 64 pg/mL (ref 18–72)
Vitamin D2 1, 25 (OH)2: 34 pg/mL
Vitamin D3 1, 25 (OH)2: 30 pg/mL

## 2013-02-22 ENCOUNTER — Telehealth: Payer: Self-pay | Admitting: *Deleted

## 2013-02-22 NOTE — Telephone Encounter (Signed)
Message copied by Alfonzo Feller on Wed Feb 22, 2013 11:35 AM ------      Message from: Graylon Good      Created: Mon Feb 20, 2013  4:32 PM                   ----- Message -----         From: Azalia Bilis, MD         Sent: 02/20/2013  12:36 PM           To: Graylon Good, CMA            If she would prefer to take once a month, ,we can try that and then call in RX ------

## 2013-02-22 NOTE — Telephone Encounter (Signed)
Opened in error

## 2013-02-22 NOTE — Telephone Encounter (Signed)
Left Message To Call Back  

## 2013-03-01 NOTE — Telephone Encounter (Signed)
(  see Vitamin d lab)

## 2013-03-29 ENCOUNTER — Ambulatory Visit: Payer: 59 | Attending: Orthopedic Surgery

## 2013-03-29 DIAGNOSIS — Z981 Arthrodesis status: Secondary | ICD-10-CM | POA: Insufficient documentation

## 2013-03-29 DIAGNOSIS — IMO0001 Reserved for inherently not codable concepts without codable children: Secondary | ICD-10-CM | POA: Insufficient documentation

## 2013-03-29 DIAGNOSIS — M25519 Pain in unspecified shoulder: Secondary | ICD-10-CM | POA: Insufficient documentation

## 2013-03-29 DIAGNOSIS — I10 Essential (primary) hypertension: Secondary | ICD-10-CM | POA: Insufficient documentation

## 2013-03-29 DIAGNOSIS — R5381 Other malaise: Secondary | ICD-10-CM | POA: Insufficient documentation

## 2013-04-03 ENCOUNTER — Ambulatory Visit: Payer: 59 | Admitting: Physical Therapy

## 2013-04-05 ENCOUNTER — Ambulatory Visit: Payer: 59 | Admitting: Physical Therapy

## 2013-04-07 ENCOUNTER — Ambulatory Visit: Payer: 59 | Admitting: Physical Therapy

## 2013-04-10 ENCOUNTER — Ambulatory Visit: Payer: 59 | Admitting: Physical Therapy

## 2013-04-12 ENCOUNTER — Ambulatory Visit: Payer: 59

## 2013-04-17 ENCOUNTER — Ambulatory Visit: Payer: 59 | Attending: Orthopedic Surgery | Admitting: Physical Therapy

## 2013-04-17 DIAGNOSIS — IMO0001 Reserved for inherently not codable concepts without codable children: Secondary | ICD-10-CM | POA: Insufficient documentation

## 2013-04-17 DIAGNOSIS — Z981 Arthrodesis status: Secondary | ICD-10-CM | POA: Insufficient documentation

## 2013-04-17 DIAGNOSIS — I10 Essential (primary) hypertension: Secondary | ICD-10-CM | POA: Insufficient documentation

## 2013-04-17 DIAGNOSIS — R5381 Other malaise: Secondary | ICD-10-CM | POA: Insufficient documentation

## 2013-04-17 DIAGNOSIS — M25519 Pain in unspecified shoulder: Secondary | ICD-10-CM | POA: Insufficient documentation

## 2013-08-08 ENCOUNTER — Encounter: Payer: Self-pay | Admitting: Internal Medicine

## 2013-09-29 ENCOUNTER — Other Ambulatory Visit (HOSPITAL_COMMUNITY): Payer: Self-pay | Admitting: Family Medicine

## 2013-09-29 DIAGNOSIS — Z0000 Encounter for general adult medical examination without abnormal findings: Secondary | ICD-10-CM

## 2013-09-29 DIAGNOSIS — N951 Menopausal and female climacteric states: Secondary | ICD-10-CM

## 2013-10-05 ENCOUNTER — Ambulatory Visit (HOSPITAL_COMMUNITY): Payer: 59

## 2013-10-18 ENCOUNTER — Ambulatory Visit (HOSPITAL_COMMUNITY)
Admission: RE | Admit: 2013-10-18 | Discharge: 2013-10-18 | Disposition: A | Discharge status: Home / Self Care | Payer: 59 | Source: Ambulatory Visit | Attending: Family Medicine | Admitting: Family Medicine

## 2013-10-18 DIAGNOSIS — Z13820 Encounter for screening for osteoporosis: Secondary | ICD-10-CM | POA: Diagnosis present

## 2013-10-18 DIAGNOSIS — Z0000 Encounter for general adult medical examination without abnormal findings: Secondary | ICD-10-CM

## 2013-10-18 DIAGNOSIS — Z780 Asymptomatic menopausal state: Secondary | ICD-10-CM | POA: Insufficient documentation

## 2013-11-20 ENCOUNTER — Other Ambulatory Visit (HOSPITAL_COMMUNITY): Payer: Self-pay | Admitting: Family Medicine

## 2013-11-20 DIAGNOSIS — Z1231 Encounter for screening mammogram for malignant neoplasm of breast: Secondary | ICD-10-CM

## 2013-12-06 ENCOUNTER — Other Ambulatory Visit (HOSPITAL_COMMUNITY): Payer: Self-pay | Admitting: Family Medicine

## 2013-12-06 ENCOUNTER — Ambulatory Visit (HOSPITAL_COMMUNITY)
Admission: RE | Admit: 2013-12-06 | Discharge: 2013-12-06 | Disposition: A | Discharge status: Home / Self Care | Payer: 59 | Source: Ambulatory Visit | Attending: Family Medicine | Admitting: Family Medicine

## 2013-12-06 DIAGNOSIS — Z1231 Encounter for screening mammogram for malignant neoplasm of breast: Secondary | ICD-10-CM

## 2013-12-08 ENCOUNTER — Ambulatory Visit (HOSPITAL_COMMUNITY): Payer: 59

## 2013-12-13 ENCOUNTER — Telehealth: Payer: Self-pay | Admitting: Gynecology

## 2013-12-13 NOTE — Telephone Encounter (Signed)
Call to pt to let her know that dr lathrop is no longer with the practice and we need to rs her appt lmtcb

## 2014-01-01 ENCOUNTER — Other Ambulatory Visit: Payer: Self-pay | Admitting: Gynecology

## 2014-01-01 NOTE — Telephone Encounter (Signed)
Last refilled/Last AEX: 12/30/12 #90/3 rfs by Dr. Collier Salina Scheduled: 04/06/14 with Dr. Quincy Simmonds Last Mammogram: 12/06/13 Bi-Rads 1: Negative  Okay to refill until AEX  Please Advise.

## 2014-01-03 ENCOUNTER — Ambulatory Visit: Payer: 59 | Admitting: Gynecology

## 2014-03-28 ENCOUNTER — Other Ambulatory Visit: Payer: Self-pay | Admitting: *Deleted

## 2014-03-28 MED ORDER — ESTROGENS CONJUGATED 0.625 MG PO TABS: 0.6250 mg | ORAL_TABLET | Freq: Every day | ORAL | Status: DC

## 2014-03-28 NOTE — Telephone Encounter (Signed)
Incoming fax from [place] requesting Premarin 0.625 mg tab  Medication refill request: premarin  Last AEX:  12/30/12 Next AEX: 04/06/14 w/ Dr. Quincy Simmonds Last MMG (if hormonal medication request): 12/06/13 BIRADS1:Neg Refill authorized: 01/01/14 #90/0R. Today #90/0R?

## 2014-03-29 NOTE — Telephone Encounter (Signed)
Fax faxed back stating that #90/0 rfs was sent [place].

## 2014-04-06 ENCOUNTER — Encounter: Payer: Self-pay | Admitting: Obstetrics and Gynecology

## 2014-04-06 ENCOUNTER — Ambulatory Visit (INDEPENDENT_AMBULATORY_CARE_PROVIDER_SITE_OTHER): Payer: 59 | Admitting: Obstetrics and Gynecology

## 2014-04-06 DIAGNOSIS — Z0000 Encounter for general adult medical examination without abnormal findings: Secondary | ICD-10-CM

## 2014-04-06 DIAGNOSIS — Z01419 Encounter for gynecological examination (general) (routine) without abnormal findings: Secondary | ICD-10-CM

## 2014-04-06 LAB — POCT URINALYSIS DIPSTICK
BILIRUBIN UA: NEGATIVE
Blood, UA: NEGATIVE
Glucose, UA: NEGATIVE
KETONES UA: NEGATIVE
LEUKOCYTES UA: NEGATIVE
Nitrite, UA: NEGATIVE
PH UA: 5
Protein, UA: NEGATIVE
Urobilinogen, UA: NEGATIVE

## 2014-04-06 MED ORDER — ESTROGENS CONJUGATED 0.625 MG PO TABS: ORAL_TABLET | ORAL | Status: DC

## 2014-04-06 NOTE — Progress Notes (Signed)
Patient ID: Debra Nguyen, female   DOB: 1955-04-24, 59 y.o.   MRN: [id number] 59 y.o. [id number] DivorcedCaucasianF here for annual exam.    Patient taking Premarin 0.625 mg daily.   Osteopenia of hip.  Vit D 42.5 in December 2015 with PCP.   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   FH of CAD - mother and father.  Father died of MI in her 24s.  Mother had a CABG.  Patient's T cholesterol 209 with LDL 96 with her PCP.   PCP: Kelton Pillar, MD  Patient's last menstrual period was 02/02/2011.          Sexually active: No. female partner The current method of family planning is post menopausal status.  S/P Hysterectomy 2013.  Exercising: Yes.    walking. Smoker:  no  Health Maintenance: Pap:  12-17-10 LGSIL (CIN 1) History of abnormal Pap:  Yes, 12-17-10 CIN 1, 12-06-09 LGSIL w/Pos. HR HPV (Pt. With long hx of abnormal paps with colposcopy and cryotherapy to cervix dating back to 1985)  Had hysterectomy 2013 for fibroids and focal CIN I w/clear margins. MMG:  12-06-13 fibroglandular density/nl:[facility] Colonoscopy:  2007 normal with Dr. Delfin Edis.  Next colonoscopy due 2017. BMD:   10-18-13 with Dr. Annalee Genta of hip.  Normal spine.  TDaP:  Up to date with work([employer]) Screening Labs:  Hb today: PCP, Urine today: Neg   reports that she has never smoked. She has never used smokeless tobacco. She reports that she drinks about 6.0 - 7.2 oz of alcohol per week. She reports that she does not use illicit drugs.  Past Medical History  Diagnosis Date  . Fibroid 2006    3 cm Right Fundal Fibroid  . ASCUS with positive high risk HPV 12/2006    Negative Colpo BX  . ASCUS with positive high risk HPV 2009  . CIN III (cervical intraepithelial neoplasia III) 07/1983    tx'd w/cryo- no subsequent abnormal paps until 2008  . Hypertension   . GERD (gastroesophageal reflux disease) 2011  . Anxiety   . Essential hypertension   . Vitamin D deficiency     Past Surgical  History  Procedure Laterality Date  . Vaginal delivery  '92 '94  . Lasik  '99  . Cystoscopy  02/23/2011    PT.STATES SHE DID NOT HAVE  . Anterior cervical decomp/discectomy fusion N/A 07/19/2012    Procedure: ANTERIOR CERVICAL DECOMPRESSION/DISCECTOMY FUSION 2 LEVELS;  Surgeon: Erline Levine, MD;  Location: [place] [facility];  Service: Neurosurgery;  Laterality: N/A;  Anterior Cervical Five-Six Cervical Six-Seven Anterior Cervical Decompression/Diskectomy/Fusion  . Abdominal hysterectomy  02/23/11    R TLH-fibroids, adenomyosis 221 g, & focal CIN I w/clear margins  . Tonsillectomy and adenoidectomy      Current Outpatient Prescriptions  Medication Sig Dispense Refill  . Calcium Carbonate-Vitamin D (CALTRATE 600+D) 600-400 MG-UNIT per tablet Take 1 tablet by mouth daily.      Marland Kitchen estrogens, conjugated, (PREMARIN) 0.625 MG tablet Take 0.625 mg by mouth daily. **Patient takes 1 pill daily**    . hydrochlorothiazide (MICROZIDE) 12.5 MG capsule Take 12.5 mg by mouth daily.      Marland Kitchen ibuprofen (ADVIL,MOTRIN) 200 MG tablet Take 600 mg by mouth 2 (two) times daily as needed for pain.    . metoprolol (TOPROL-XL) 50 MG 24 hr tablet Take 25 mg by mouth daily.     . Multiple Vitamins-Minerals (MULTIVITAMINS THER. W/MINERALS) TABS Take 1 tablet by mouth daily.      Marland Kitchen  Vitamin D, Ergocalciferol, (DRISDOL) 50000 UNITS CAPS Take 50,000 Units by mouth every 14 (fourteen) days. The first and fifteenth of the month.      No current facility-administered medications for this visit.    Family History  Problem Relation Age of Onset  . Hypertension Mother   . Heart disease Mother   . Cancer Mother     lung  . Hypertension Father   . Heart attack Father     ROS:  Pertinent items are noted in HPI.  Otherwise, a comprehensive ROS was negative.  Exam:   LMP 02/02/2011         Ht Readings from Last 3 Encounters:  12/30/12 5\' 7"  (1.702 m)  02/23/11 5\' 7"  (1.702 m)  02/18/11 5\' 7"  (1.702 m)    General appearance:  alert, cooperative and appears stated age Head: Normocephalic, without obvious abnormality, atraumatic Neck: no adenopathy, supple, symmetrical, trachea midline and thyroid normal to inspection and palpation Lungs: clear to auscultation bilaterally Breasts: normal appearance, no masses or tenderness, Inspection negative, No nipple retraction or dimpling, No nipple discharge or bleeding, No axillary or supraclavicular adenopathy Heart: regular rate and rhythm Abdomen: soft, non-tender; bowel sounds normal; no masses,  no organomegaly Extremities: extremities normal, atraumatic, no cyanosis or edema Skin: Skin color, texture, turgor normal. No rashes or lesions Lymph nodes: Cervical, supraclavicular, and axillary nodes normal. No abnormal inguinal nodes palpated Neurologic: Grossly normal   Pelvic: External genitalia:  no lesions              Urethra:  normal appearing urethra with no masses, tenderness or lesions              Bartholins and Skenes: normal                 Vagina: normal appearing vagina with normal color and discharge, no lesions              Cervix: absent              Pap taken: Yes.   Bimanual Exam:  Uterus:  uterus absent              Adnexa: no mass, fullness, tenderness               Rectovaginal: Confirms               Anus:  normal sphincter tone, no lesions  Chaperone was present for exam.  A:  Well Woman with normal exam Osteopenia.  History of robotic hysterectomy for fibroids and cervical dysplasia.  History of CIN III and then CIN I on final cervical pathology at hysterectomy in 2013.  ERT patient.  Family history of CAD maternal and paternal sides.   P:   Mammogram yearly.  pap smear and HR HPV. Discussed risks and benefits of ERT.  Risks include stroke, MI, PE, DVT and breast CA. Refill Premarin 0.625 mg daily.  See orders.  Discussed weight bearing exercise, Ca, Vit D. Needs bone density in 2 years.  return annually or prn

## 2014-04-06 NOTE — Patient Instructions (Signed)

## 2014-04-10 LAB — IPS PAP TEST WITH HPV

## 2014-04-12 ENCOUNTER — Telehealth: Payer: Self-pay | Admitting: Emergency Medicine

## 2014-04-12 DIAGNOSIS — R87622 Low grade squamous intraepithelial lesion on cytologic smear of vagina (LGSIL): Secondary | ICD-10-CM

## 2014-04-12 NOTE — Telephone Encounter (Signed)
-----   Message from [place], MD sent at 04/10/2014 10:06 PM EST ----- Please inform the patient of her pap of the vagina showing LGSIL and positive HR HPV.  She has a history of this prior to hysterectomy.  I am recommending colposcopy with me.   Cc- Marisa Sprinkles

## 2014-04-12 NOTE — Telephone Encounter (Signed)
Message left to return call to [place] at [phone] on home number per designated party release form. No details left.

## 2014-04-13 NOTE — Telephone Encounter (Signed)
Patient notified of pap results and recommendation for colpo as instructed by Dr Quincy Simmonds.  Patient requested brief phone call with Dr Quincy Simmonds prior to colpo. Concerns over persistent abnormal pap even after hysterectomy.   Advised that this discussion is more involved than a phone call. Offered consult versus procedure and discussion with Dr Quincy Simmonds will discuss at time of colpo and patient may decline procedure if not comfortable with proceeding.  Patient chooses to schedule colpo with consult. Scheduled for 05-07-14.   Is this agreeable?

## 2014-04-13 NOTE — Telephone Encounter (Signed)
Pt returning call

## 2014-04-13 NOTE — Telephone Encounter (Signed)
Encounter closed

## 2014-04-13 NOTE — Telephone Encounter (Signed)
This is very appropriate.  Patient does need colposcopy and discussion of vaginal dysplasia following hysterectomy.

## 2014-05-07 ENCOUNTER — Telehealth: Payer: Self-pay | Admitting: Obstetrics and Gynecology

## 2014-05-07 ENCOUNTER — Ambulatory Visit: Payer: 59 | Admitting: Obstetrics and Gynecology

## 2014-05-07 NOTE — Telephone Encounter (Signed)
Spoke with patient. Appointment for consult and colposcopy rescheduled to 3/28 at 3pm with Dr.Silva. Patient is agreeable to date and time.  Routing to provider for final review. Patient agreeable to disposition. Will close encounter

## 2014-05-07 NOTE — Telephone Encounter (Signed)
Patient's appointment for colposcopy with consult prior has ben canceled (provider delayed in surgery) Patient is aware a nurse will call later to rs.

## 2014-05-14 ENCOUNTER — Ambulatory Visit (INDEPENDENT_AMBULATORY_CARE_PROVIDER_SITE_OTHER): Payer: 59 | Admitting: Obstetrics and Gynecology

## 2014-05-14 ENCOUNTER — Encounter: Payer: Self-pay | Admitting: Obstetrics and Gynecology

## 2014-05-14 VITALS — BP 116/74 | HR 90 | Ht 67.0 in | Wt 139.6 lb

## 2014-05-14 DIAGNOSIS — R888 Abnormal findings in other body fluids and substances: Secondary | ICD-10-CM | POA: Diagnosis not present

## 2014-05-14 DIAGNOSIS — R87622 Low grade squamous intraepithelial lesion on cytologic smear of vagina (LGSIL): Secondary | ICD-10-CM | POA: Diagnosis not present

## 2014-05-14 DIAGNOSIS — IMO0002 Reserved for concepts with insufficient information to code with codable children: Secondary | ICD-10-CM

## 2014-05-14 DIAGNOSIS — B977 Papillomavirus as the cause of diseases classified elsewhere: Secondary | ICD-10-CM | POA: Diagnosis not present

## 2014-05-14 NOTE — Patient Instructions (Signed)

## 2014-05-14 NOTE — Progress Notes (Signed)
Subjective:     Patient ID: Debra Nguyen, female   DOB: [birth year], 59 y.o.   MRN: [id number]  HPI   Patient is here for colposcopy and wishes to review her pap history and discuss the necessity of the procedure today.  States she was told that she just has to accept that she has the virus and will have abnormal paps.   Colposcopy with biopy showed moderate to severe dysplasia 08/11/83.  Had cryotherapy following this in [place], [place].  Pap 12/06/09 - LGSIL and positive HR HPV Pap 12/17/10 - CIN I. Status post hysterectomy 2013 for fibroids and focal CIN I w/clear margins. Pap 04/06/14 - LGSIL and positive HR HPV.  Colposcopy recommended.  Review of Systems    See HPI.   Objective:   Physical Exam  Genitourinary:     Consent for colposcopy of the vagina.  Speculum placed in vagina.  3% acetic acid placed.  Finding of acetowhite thickending of the left apical vaginal sidewall.   One biopsy taken and sent to pathology.  Monsel's placed.  Minimal bleeding.  No complications.      Assessment:     LGSIL and positive HR HPV. Status post hysterectomy with CIN I noted on final pathology. History of CIN II in remote past.     Plan:     Discussion regarding abnormal paps, cervical dysplasia, colposcopy, and HPV.  Discussion that hysterectomy does not remove the virus from her body; it merely removes the uterus and cervix.   She will never get cervical cancer, but may be at increased risk of vagina, vulvar, anorectal, and head and neck cancer.  The HPV is spread through intimate and sexual contact.  Follow up biopsy results.  Instructions and precautions given.  25 minutes face to face time of which over 50% was spent in counseling.   After visit summary to patient.

## 2014-05-18 LAB — IPS OTHER TISSUE BIOPSY

## 2014-05-24 ENCOUNTER — Telehealth: Payer: Self-pay | Admitting: Emergency Medicine

## 2014-05-24 NOTE — Telephone Encounter (Signed)
Spoke with patient and message from Dr. Quincy Simmonds given.  Patient agreeable to consult appointment to discuss further with Dr. Quincy Simmonds.  Scheduled for patient request of date 06/04/14 at 1400. She will call back with any concerns prior to appointment. Routing to provider for final review. Patient agreeable to disposition. Will close encounter

## 2014-05-24 NOTE — Telephone Encounter (Signed)
-----   Message from Nunzio Cobbs, MD sent at 05/23/2014  5:34 PM EDT ----- Please contact patient regarding biopsy results.  She has moderate dysplasia of the vagina and will need to return to discuss results and treatment options.   Cc - Marisa Sprinkles

## 2014-06-04 ENCOUNTER — Ambulatory Visit (INDEPENDENT_AMBULATORY_CARE_PROVIDER_SITE_OTHER): Payer: 59 | Admitting: Obstetrics and Gynecology

## 2014-06-04 ENCOUNTER — Encounter: Payer: Self-pay | Admitting: Obstetrics and Gynecology

## 2014-06-04 VITALS — BP 114/64 | HR 66 | Ht 67.0 in | Wt 140.2 lb

## 2014-06-04 DIAGNOSIS — N891 Moderate vaginal dysplasia: Secondary | ICD-10-CM

## 2014-06-04 NOTE — Progress Notes (Signed)
Patient ID: Debra Nguyen, female   DOB: [birth year], 59 y.o.   MRN: [id number] GYNECOLOGY  VISIT   HPI: 59 y.o.   Divorced  Caucasian  female   G2P2002 with Patient's last menstrual period was 02/02/2011.   here for consultation regarding results of colposcopy.   Pap 04/06/14 - showed LGSIL.  Colpo 05/14/14 - biopsy of left vaginal sidewall - VAIN II.   History is as follows: Colposcopy with biopy showed moderate to severe dysplasia 08/11/83. Had cryotherapy following this in [place], [place].  Pap 12/06/09 - LGSIL and positive HR HPV Pap 12/17/10 - CIN I. Status post hysterectomy 2013 for fibroids and focal CIN I w/clear margins.     GYNECOLOGIC HISTORY: Patient's last menstrual period was 02/02/2011. Contraception:  Hysterectomy  Menopausal hormone therapy: Premarin 0.625mg  Last pap:  04-06-14 LGSIL:Pos. HR HPV of Vagina;      colposcopy 05-14-14 mod. Dysplasia of vagina Last mammogram: 12-06-13 dense/[facility]        OB History    Gravida Para Term Preterm AB TAB SAB Ectopic Multiple Living   2 2 2       2          Patient Active Problem List   Diagnosis Date Noted  . History of abnormal Pap smear 12/30/2012  . Hypertension   . GERD (gastroesophageal reflux disease)     Past Medical History  Diagnosis Date  . Fibroid 2006    3 cm Right Fundal Fibroid  . ASCUS with positive high risk HPV 12/2006    Negative Colpo BX  . ASCUS with positive high risk HPV 2009  . CIN III (cervical intraepithelial neoplasia III) 07/1983    tx'd w/cryo- no subsequent abnormal paps until 2008  . Hypertension   . GERD (gastroesophageal reflux disease) 2011  . Anxiety   . Essential hypertension   . Vitamin D deficiency     Past Surgical History  Procedure Laterality Date  . Vaginal delivery  '92 '94  . Lasik  '99  . Cystoscopy  02/23/2011    PT.STATES SHE DID NOT HAVE  . Anterior cervical decomp/discectomy fusion N/A 07/19/2012    Procedure: ANTERIOR CERVICAL  DECOMPRESSION/DISCECTOMY FUSION 2 LEVELS;  Surgeon: Erline Levine, MD;  Location: [place] [facility];  Service: Neurosurgery;  Laterality: N/A;  Anterior Cervical Five-Six Cervical Six-Seven Anterior Cervical Decompression/Diskectomy/Fusion  . Abdominal hysterectomy  02/23/11    R TLH-fibroids, adenomyosis 221 g, & focal CIN I w/clear margins  . Tonsillectomy and adenoidectomy      Current Outpatient Prescriptions  Medication Sig Dispense Refill  . Calcium Carbonate-Vitamin D (CALTRATE 600+D) 600-400 MG-UNIT per tablet Take 1 tablet by mouth daily.      Marland Kitchen estrogens, conjugated, (PREMARIN) 0.625 MG tablet Take one tablet 0.625 mg by mouth daily. 90 tablet 3  . hydrochlorothiazide (MICROZIDE) 12.5 MG capsule Take 12.5 mg by mouth as needed.     Marland Kitchen ibuprofen (ADVIL,MOTRIN) 200 MG tablet Take 600 mg by mouth 2 (two) times daily as needed for pain.    . metoprolol succinate (TOPROL-XL) 25 MG 24 hr tablet Take 25 mg by mouth daily.    . Multiple Vitamins-Minerals (MULTIVITAMINS THER. W/MINERALS) TABS Take 1 tablet by mouth daily.      . Vitamin D, Ergocalciferol, (DRISDOL) 50000 UNITS CAPS Take 50,000 Units by mouth every 14 (fourteen) days. The first and fifteenth of the month.      No current facility-administered medications for this visit.     ALLERGIES:  Codeine and Ultram  Family History  Problem Relation Age of Onset  . Hypertension Mother   . Heart disease Mother   . Cancer Mother     lung  . Hypertension Father   . Heart attack Father     History   Social History  . Marital Status: Divorced    Spouse Name: N/A  . Number of Children: N/A  . Years of Education: N/A   Occupational History  . Not on file.   Social History Main Topics  . Smoking status: Never Smoker   . Smokeless tobacco: Never Used  . Alcohol Use: 6.0 - 7.2 oz/week    10-12 Glasses of wine per week     Comment: 10-12 glasses of wine a week  . Drug Use: No  . Sexual Activity: No     Comment: R-TLH   Other  Topics Concern  . Not on file   Social History Narrative    ROS:  Pertinent items are noted in HPI.  PHYSICAL EXAMINATION:    BP 114/64 mmHg  Pulse 66  Ht 5\' 7"  (1.702 m)  Wt 140 lb 3.2 oz (63.594 kg)  BMI 21.95 kg/m2  LMP 02/02/2011     General appearance: alert, cooperative and appears stated age   ASSESSMENT  VAIN II.  Small disease burden. Status post hysterectomy - CIN I of cervix.  Positive HR HPV.   PLAN  Discussion of VAIN - etiology and treatment options - excision, laser, [place], 5FU, irradiation.  I think that recolpo and biopsy of the left vaginal sidewall will actually remove the dysplasia, which was confined to a small area.  Patient would like to move in this direction.  She declines GYN ONC consultation at this time.  Will likely need paps and colpo every 6 months following excision/removal. Referred also to [email or web address] Questions invited and answered.   An After Visit Summary was printed and given to the patient.  __25____ minutes face to face time of which over 50% was spent in counseling.

## 2014-06-04 NOTE — Patient Instructions (Signed)
Go to www.uptodate.com to learn more about vaginal dysplasia.

## 2014-06-06 ENCOUNTER — Other Ambulatory Visit (HOSPITAL_COMMUNITY): Payer: Self-pay | Admitting: Family Medicine

## 2014-06-06 DIAGNOSIS — M5431 Sciatica, right side: Secondary | ICD-10-CM

## 2014-06-21 ENCOUNTER — Ambulatory Visit (HOSPITAL_COMMUNITY): Payer: 59

## 2014-06-22 ENCOUNTER — Ambulatory Visit (HOSPITAL_COMMUNITY)
Admission: RE | Admit: 2014-06-22 | Discharge: 2014-06-22 | Disposition: A | Discharge status: Home / Self Care | Payer: 59 | Source: Ambulatory Visit | Attending: Family Medicine | Admitting: Family Medicine

## 2014-06-22 DIAGNOSIS — M5431 Sciatica, right side: Secondary | ICD-10-CM

## 2014-06-22 DIAGNOSIS — D1809 Hemangioma of other sites: Secondary | ICD-10-CM | POA: Insufficient documentation

## 2014-06-22 DIAGNOSIS — M7138 Other bursal cyst, other site: Secondary | ICD-10-CM | POA: Insufficient documentation

## 2014-06-22 DIAGNOSIS — M545 Low back pain: Secondary | ICD-10-CM | POA: Diagnosis present

## 2014-06-22 DIAGNOSIS — M4726 Other spondylosis with radiculopathy, lumbar region: Secondary | ICD-10-CM | POA: Diagnosis not present

## 2014-06-22 DIAGNOSIS — M4316 Spondylolisthesis, lumbar region: Secondary | ICD-10-CM | POA: Insufficient documentation

## 2014-07-03 ENCOUNTER — Telehealth: Payer: Self-pay

## 2014-07-03 DIAGNOSIS — N891 Moderate vaginal dysplasia: Secondary | ICD-10-CM

## 2014-07-03 NOTE — Telephone Encounter (Signed)
Left message to call [place] at [phone].   Need to speak with patient in regards to scheduling six month colpo and biopsy of the left vaginal sidewall with Dr.Silva.

## 2014-07-05 NOTE — Telephone Encounter (Signed)
Spoke with patient. Patient states that she has decided she would like to see Dr.Rossi with GYN ONC before proceeding with 6 month colpo and biopsy with Dr.Silva. This was discussed with Dr.Silva at appointment on 06/04/2014. Please see OV note. Advised referral will be placed to [facility] and our referrals coordinator in the office will work with her office to get her scheduled. Advised she will hear directly from our office or theirs regarding appointment date and time. Patient is agreeable and verbalizes understanding. Patient would like results from previous [place] in [place] sent to [facility] as well these are in patients paper chart. Advised I will speak with referrals coordinator regarding these being sent. Patient is agreeable.  Cc: Felipa Emory

## 2014-07-05 NOTE — Telephone Encounter (Signed)
Thank you for making this referral.  I did discuss GYN ONC consultation with the patient.  I agree with her proceeding forward with the visit!

## 2014-07-06 NOTE — Telephone Encounter (Signed)
Per Alvis Lemmings, we are not supposed to send records that were sent to our office from another office outside of our office. Dr Denman George is on EPIC and can see everything that has been recorded/scanned/entered etc into EPIC regarding this patient.

## 2014-07-13 NOTE — Telephone Encounter (Signed)
Left message for patient to advise that she is scheduled with Dr Denman George 06.06.2016. She should arrive at 1:15 for a 1:45 appt. Advised to contact Dr Serita Grit office directly to reschedule if needed. Left Dr Serita Grit telephone # for patient.

## 2014-07-23 ENCOUNTER — Ambulatory Visit: Payer: 59 | Admitting: Gynecologic Oncology

## 2014-07-27 ENCOUNTER — Ambulatory Visit: Payer: 59 | Attending: Gynecologic Oncology | Admitting: Gynecologic Oncology

## 2014-07-27 ENCOUNTER — Encounter: Payer: Self-pay | Admitting: Gynecologic Oncology

## 2014-07-27 VITALS — BP 144/85 | HR 69 | Temp 98.2°F | Resp 20 | Ht 67.0 in | Wt 142.4 lb

## 2014-07-27 DIAGNOSIS — N891 Moderate vaginal dysplasia: Secondary | ICD-10-CM | POA: Diagnosis not present

## 2014-07-27 DIAGNOSIS — Z90710 Acquired absence of both cervix and uterus: Secondary | ICD-10-CM | POA: Diagnosis not present

## 2014-07-27 DIAGNOSIS — I10 Essential (primary) hypertension: Secondary | ICD-10-CM | POA: Diagnosis not present

## 2014-07-27 NOTE — Patient Instructions (Signed)
Schedule an appointment within the next 3 months to followup with Dr. Quincy Simmonds. Please call us in the future with any questions or concerns.

## 2014-07-27 NOTE — Progress Notes (Signed)
Consult Note: Gyn-Onc  Consult was requested by Dr. Quincy Simmonds for the evaluation of [name] 59 y.o. 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 vaginal intraepithelial neoplasia II (VAIN II)    new consult    Assessment/Plan:  Ms. Jamara Vary  is a 59 y.o.  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  On my examination of the vagina stay with application of acetic acid I do not see any evidence for a higher grade lesion or invasive carcinoma. I agree with Dr. Elza Rafter recommendation for repeat biopsy (which may be excisional) of VAIN II. I offered Audrea Muscat laser of the upper vagina. I described that this would involve a general and metastatic and some increased recovery compared to an office procedure. Additionally I described that this will not be more curative because her underlying condition of persistent high-risk HPV infection will determine recurrence rather than any excisional procedure she has for the dysplasia that comes as a result of this high-risk HPV infection. The patient is accepting of this and will follow-up with Dr. Quincy Simmonds in approximately 3 months. I felt that this was a reasonable timeframe to wait for subsequent biopsies of the vaginal cuff given my examination findings today.  We discussed potential options for treating persistent high-risk HPV. I discussed that there is data from [place] that treating well and with high-grade cervical dysplasia status post additional procedures with a course of the HPV vaccine has been associated with a reduction in the incidence of recurrence of high-grade lesions. I believe it is reasonable to extrapolate this data to vaginal dysplasia population. I discussed with the patient that this is outside of the FDA approved indication to the HPV vaccine and as a result is typically necessary to pay out of pocket for the vaccination schedule. I discussed that she  could discuss [place] with further with Dr. Quincy Simmonds if she is interested in being vaccinated. I discussed that we think that the mechanism by which this effect occurs is a vaccine mediated upregulation of the body's immune system to the HPV virus.   HPI: The patient is a very pleasant 59 year old gravida 2 para 2 who is seen in consultation at the request of Dr. Quincy Simmonds for persistent high risk HPV and moderate vaginal dysplasia. Patient has a 35 year history of initially cervical, and then vaginal dysplasia. She's had multiple excisional or ablative procedures throughout the years including cryotherapy remotely and a hysterectomy performed in 2013 for symptomatic uterine fibroids in the setting of low-grade cervical dysplasia. She developed vaginal dysplasia post-hysterectomy with a Pap smear in February 2016 showing low-grade squamous intraepithelial lesion and positive for high-risk HPV, and colposcopy in March 2016 revealed moderate grade dysplasia in the left vaginal vault in 2 very small foci one of which was biopsied to remove it and demonstrated VAIN II.  Interval History: The patient has no concerning symptoms including no bleeding or discharge from the vagina, no lower extremity edema, no back pain, no dyspareunia. She is somewhat troubled by the recurrent nature of her dysplasia and the persistent nature of her high-risk HPV.  Current Meds:  Outpatient Encounter Prescriptions as of 07/27/2014  Medication Sig  . Calcium Carbonate-Vitamin D (CALTRATE 600+D) 600-400 MG-UNIT per tablet Take 1 tablet by mouth daily.    Marland Kitchen estrogens, conjugated, (PREMARIN) 0.625 MG tablet Take one tablet 0.625 mg by mouth daily.  . hydrochlorothiazide (  MICROZIDE) 12.5 MG capsule Take 12.5 mg by mouth daily.   Marland Kitchen ibuprofen (ADVIL,MOTRIN) 200 MG tablet Take 600 mg by mouth 2 (two) times daily as needed for pain.  . metoprolol succinate (TOPROL-XL) 25 MG 24 hr tablet Take 25 mg by mouth daily.  . Multiple Vitamins-Minerals  (MULTIVITAMINS THER. W/MINERALS) TABS Take 1 tablet by mouth daily.    . [DISCONTINUED] Vitamin D, Ergocalciferol, (DRISDOL) 50000 UNITS CAPS Take 50,000 Units by mouth every 14 (fourteen) days. The first and fifteenth of the month.    No facility-administered encounter medications on file as of 07/27/2014.    Allergy:  Allergies  Allergen Reactions  . Codeine Itching  . Ultram [Tramadol]     Social Hx:   History   Social History  . Marital Status: Divorced    Spouse Name: N/A  . Number of Children: N/A  . Years of Education: N/A   Occupational History  . Not on file.   Social History Main Topics  . Smoking status: Never Smoker   . Smokeless tobacco: Never Used  . Alcohol Use: 6.0 - 7.2 oz/week    10-12 Glasses of wine per week     Comment: 10-12 glasses of wine a week  . Drug Use: No  . Sexual Activity: No     Comment: R-TLH   Other Topics Concern  . Not on file   Social History Narrative    Past Surgical Hx:  Past Surgical History  Procedure Laterality Date  . Vaginal delivery  '92 '94  . Lasik  '99  . Cystoscopy  02/23/2011    PT.STATES SHE DID NOT HAVE  . Anterior cervical decomp/discectomy fusion N/A 07/19/2012    Procedure: ANTERIOR CERVICAL DECOMPRESSION/DISCECTOMY FUSION 2 LEVELS;  Surgeon: Erline Levine, MD;  Location: [facility];  Service: Neurosurgery;  Laterality: N/A;  Anterior Cervical Five-Six Cervical Six-Seven Anterior Cervical Decompression/Diskectomy/Fusion  . Abdominal hysterectomy  02/23/11    R TLH-fibroids, adenomyosis 221 g, & focal CIN I w/clear margins  . Tonsillectomy and adenoidectomy      Past Medical Hx:  Past Medical History  Diagnosis Date  . Fibroid 2006    3 cm Right Fundal Fibroid  . ASCUS with positive high risk HPV 12/2006    Negative Colpo BX  . ASCUS with positive high risk HPV 2009  . CIN III (cervical intraepithelial neoplasia III) 07/1983    tx'd w/cryo- no subsequent abnormal paps until 2008  . Hypertension   . GERD  (gastroesophageal reflux disease) 2011  . Anxiety   . Essential hypertension   . Vitamin D deficiency     Past Gynecological History:  SVD x 2  Patient's last menstrual period was 02/02/2011.  Family Hx:  Family History  Problem Relation Age of Onset  . Hypertension Mother   . Heart disease Mother   . Cancer Mother     lung  . Hypertension Father   . Heart attack Father     Review of Systems:  Constitutional  Feels well,    ENT Normal appearing ears and nares bilaterally Skin/Breast  No rash, sores, jaundice, itching, dryness Cardiovascular  No chest pain, shortness of breath, or edema  Pulmonary  No cough or wheeze.  Gastro Intestinal  No nausea, vomitting, or diarrhoea. No bright red blood per rectum, no abdominal pain, change in bowel movement, or constipation.  Genito Urinary  No frequency, urgency, dysuria, see above Musculo Skeletal  No myalgia, arthralgia, joint swelling or pain  Neurologic  No weakness, numbness, change in gait,  Psychology  No depression, anxiety, insomnia.   Vitals:  Blood pressure 144/85, pulse 69, temperature 98.2 F (36.8 C), temperature source Oral, resp. rate 20, height 5\' 7"  (1.702 m), weight 142 lb 6.4 oz (64.592 kg), last menstrual period 02/02/2011, SpO2 100 %.  Physical Exam: WD in NAD Neck  Supple NROM, without any enlargements.  Lymph Node Survey No cervical supraclavicular or inguinal adenopathy Cardiovascular  Pulse normal rate, regularity and rhythm. S1 and S2 normal.  Lungs  Clear to auscultation bilateraly, without wheezes/crackles/rhonchi. Good air movement.  Skin  No rash/lesions/breakdown  Psychiatry  Alert and oriented to person, place, and time  Abdomen  Normoactive bowel sounds, abdomen soft, non-tender and thin without evidence of hernia.  Back No CVA tenderness Genito Urinary  Vulva/vagina: Normal external female genitalia.  No lesions. No discharge or bleeding.  Bladder/urethra:  No lesions or  masses, well supported bladder  Vagina: Mostly normal in appearance with no hemorrhagic or friable lesions. The vaginal vault is grossly normal. However with application of 4% acetic acid symptoms slightly erythematous areas are identified at the left vaginal fornix, less than 1 cm in greatest dimension.  Cervix: surgically absent  Uterus: surgically absent  Adnexa: no palpable masses. Rectal  Good tone, no masses no cul de sac nodularity.  Extremities  No bilateral cyanosis, clubbing or edema.  Donaciano Eva, MD   07/27/2014, 2:15 PM

## 2014-08-07 ENCOUNTER — Other Ambulatory Visit: Payer: Self-pay | Admitting: Obstetrics and Gynecology

## 2014-08-07 ENCOUNTER — Telehealth: Payer: Self-pay

## 2014-08-07 DIAGNOSIS — N891 Moderate vaginal dysplasia: Secondary | ICD-10-CM

## 2014-08-07 NOTE — Telephone Encounter (Signed)
-----   Message from Nunzio Cobbs, MD sent at 08/04/2014  7:23 PM EDT ----- Regarding: please contact patinet to schedule a pap with me in September 2016 Please contact patient to schedule a pap with me for September 2016.  I sent her to Dr. Denman George following a colposcopy showing CIN II of the vaginal cuff.  Patient had a long history of dysplasia.  No treatments were performed.  Dr. Denman George recommended follow up in 3 months with me.   Please also enter pap recall for September 2016.  Thank you!

## 2014-08-07 NOTE — Telephone Encounter (Signed)
Left message to call Kaitlyn at [phone]. 

## 2014-08-07 NOTE — Telephone Encounter (Addendum)
Spoke with patient. Advised patient of need for follow up in 3 months with pap smear. Patient states "I am almost certain that I need a colposcopy to. I was supposed to have one with a pap and then I decided to go see Dr.Rossi. So I think I still need it." Advised Dr.Silva and Dr.Rossi are recommending three month repeat pap. Patient would like me to check with Dr.Silva before scheduling. Advised will speak with provider regarding scheduling and return call. Patient is agreeable.

## 2014-08-07 NOTE — Telephone Encounter (Signed)
OK for colposcopy and pap smear at the same time.  Multiple options were discussed, and it was difficult for me to tell what the patient and Dr. Denman George agreed upon.  I am totally OK with doing the colposcopy and the pap at the same time.  I will place an order for precert.  Thanks!

## 2014-08-07 NOTE — Telephone Encounter (Signed)
Spoke with patient. Patient is agreeable to schedule colpo and pap for September of 2016 at this time. Appointment scheduled for 9/28 at 3pm with Dr.Silva. Patient is agreeable to date and time. Order has been placed for precert.   Instructions given. Motrin 800 mg po x , one hour before appointment with food. Make sure to eat a meal before appointment and drink plenty of fluids.   Cc: Theresia Lo  Routing to provider for final review. Patient agreeable to disposition. Will close encounter.

## 2014-10-18 ENCOUNTER — Telehealth: Payer: Self-pay | Admitting: Obstetrics and Gynecology

## 2014-10-18 DIAGNOSIS — M7138 Other bursal cyst, other site: Secondary | ICD-10-CM

## 2014-10-18 HISTORY — DX: Other bursal cyst, other site: M71.38

## 2014-10-18 NOTE — Telephone Encounter (Signed)
Left message on voicemail regarding appointment time change.

## 2014-11-05 ENCOUNTER — Telehealth: Payer: Self-pay | Admitting: Obstetrics and Gynecology

## 2014-11-05 NOTE — Telephone Encounter (Signed)
Called patient to review benefits for procedure. Left voicemail to call back and review. °

## 2014-11-14 ENCOUNTER — Encounter: Payer: Self-pay | Admitting: Obstetrics and Gynecology

## 2014-11-14 ENCOUNTER — Ambulatory Visit (INDEPENDENT_AMBULATORY_CARE_PROVIDER_SITE_OTHER): Payer: 59 | Admitting: Obstetrics and Gynecology

## 2014-11-14 DIAGNOSIS — N891 Moderate vaginal dysplasia: Secondary | ICD-10-CM

## 2014-11-14 NOTE — Progress Notes (Signed)
Subjective:     Patient ID: Debra Nguyen, female   DOB: [birth year], 59 y.o.   MRN: [id number]  HPI  Patient is here today for colposcopy and repeat pap smear. History of abnormal pap as follows:   Colposcopy with biopy showed moderate to severe dysplasia 08/11/83. Had cryotherapy following this in [place], [place].  Pap 12/06/09 - LGSIL and positive HR HPV Pap 12/17/10 - CIN I. Status post hysterectomy 2013 for fibroids and focal CIN I w/clear margins. Pap 04/06/14 - showed LGSIL Colpo 05/14/14 - biopsy of left vaginal sidewall - VAIN II. Colposcopy with GYN ONC 07/27/14 - no high grade disease seen.  No biopsies.   Review of Systems  LMP: Hysterectomy Contraception:  Hysterectomy      Objective:   Physical Exam  Genitourinary:     Colposcopy Consent for procedure.  Pap and HR HPV done.  3% acetic acid to the vaginal cuff.  Biopsy of left vaginal apex to pathology.  Separate biopsy of left lateral vaginal apex to pathology. Monsel's placed.  Minimal EBL.  No complications.      Assessment:     Status post hysterectomy - CIN I. Hx of VAIN II post hysterectomy.     Plan:     Discussion of VAIN.  Follow up pap/HR HPV testing, and colpo biopsies.  If has persistent high grade dysplasia of vagina - VAIN II or III, will refer back to GYN ONC. Keep appointment for annual in March 2017.  After visit summary to patient.

## 2014-11-14 NOTE — Patient Instructions (Signed)

## 2014-11-16 LAB — IPS OTHER TISSUE BIOPSY

## 2014-11-16 LAB — IPS PAP TEST WITH HPV

## 2014-11-21 ENCOUNTER — Telehealth: Payer: Self-pay | Admitting: Emergency Medicine

## 2014-11-21 NOTE — Telephone Encounter (Signed)
-----   Message from Nunzio Cobbs, MD sent at 11/16/2014 10:26 PM EDT ----- Please report results of pap and colpo biopsies to patient.  Pap showed LGSIL and positive HR HPV.  Colposcopy biopsies showed both LGSIL and HGSIL (CIN II).  I do recommend she proceed with treatment.  This can be laser of the upper vagina or excision in the operating room or vaginal 5FU. I recommend return to Dr. Denman George.  Please facilitate.  Cc-  Marisa Sprinkles

## 2014-11-21 NOTE — Telephone Encounter (Signed)
Message left to return call to [place] at [phone] at home number per designated party release form.

## 2014-11-23 NOTE — Telephone Encounter (Signed)
Recall for 02/2015 placed.

## 2014-11-23 NOTE — Telephone Encounter (Signed)
Please place patient in pap recall for January 2017 so we are sure that she has had follow up care with GYN ONC.

## 2014-11-23 NOTE — Telephone Encounter (Signed)
Spoke with patient and message from Dr. Quincy Simmonds given.  Patient verbalized understanding.  She requests an appointment in November with Dr. Denman George due to traveling and work schedules. Appointment scheduled for 01/09/15 at arrive at 1130 for 1200 appointment. Patient given number to direct schedule at Gyn onc at [phone] and encouraged patient to work with scheduler and her calendar to obtain an earlier appointment and advised not recommended to wait for consult with Dr. Denman George until 01/09/15. Patient verbalized understanding. She will call and schedule earlier appointment if she can with work and personal schedule.   Routing to provider for final review. Patient agreeable to disposition. Will close encounter.

## 2014-11-28 ENCOUNTER — Other Ambulatory Visit: Payer: Self-pay

## 2014-11-28 DIAGNOSIS — Z1231 Encounter for screening mammogram for malignant neoplasm of breast: Secondary | ICD-10-CM

## 2014-12-06 ENCOUNTER — Telehealth: Payer: Self-pay | Admitting: Obstetrics and Gynecology

## 2014-12-06 NOTE — Telephone Encounter (Signed)
Patient wants to talk with [facility]. She states she spoke with [facility] and now has a couple of questions.

## 2014-12-07 NOTE — Telephone Encounter (Signed)
Returned call to patient. Left voicemail for return call.

## 2014-12-11 NOTE — Telephone Encounter (Signed)
Returned call

## 2014-12-12 NOTE — Telephone Encounter (Signed)
Returned call to patient. Left voicemail.

## 2014-12-24 ENCOUNTER — Encounter: Payer: Self-pay | Admitting: Gynecologic Oncology

## 2014-12-24 ENCOUNTER — Ambulatory Visit
Admission: RE | Admit: 2014-12-24 | Discharge: 2014-12-24 | Disposition: A | Discharge status: Home / Self Care | Payer: 59 | Source: Ambulatory Visit

## 2014-12-24 ENCOUNTER — Ambulatory Visit: Payer: 59 | Attending: Gynecologic Oncology | Admitting: Gynecologic Oncology

## 2014-12-24 VITALS — BP 146/74 | HR 75 | Temp 98.3°F | Resp 18 | Ht 67.0 in | Wt 143.4 lb

## 2014-12-24 DIAGNOSIS — N891 Moderate vaginal dysplasia: Secondary | ICD-10-CM | POA: Diagnosis not present

## 2014-12-24 DIAGNOSIS — Z1231 Encounter for screening mammogram for malignant neoplasm of breast: Secondary | ICD-10-CM

## 2014-12-24 NOTE — Patient Instructions (Signed)
Plan to follow up in March for a repeat colpo.  Please call for any questions or concerns.

## 2014-12-24 NOTE — Progress Notes (Signed)
Follow-up Note: Gyn-Onc  Consult was initially requested by Dr. Quincy Simmonds for the evaluation of Debra Nguyen 59 y.o. 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 VAIN II    follow up    Assessment/Plan:  [name]  is a 59 y.o.  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   I discussed with Debra Nguyen that her disease is stable based on her recent biopsies. I offered vaginal laser, but again discussed that laser may eradicate the current area of VAINII, but would not eliminate the hrHPV which can and may cause recurrence at other lower genital sites. Because it is not a curative measure, and because Debra Nguyen feels comfortable about the natural history of her dysplasia, she is electing for close followup for now. I believe this is reasonable provided she has good compliance with colposcopy and biopsy at 6 monthly basis. I will see her back for colpo and biopsy in late March.  HPI: The patient is a very pleasant 59 year old gravida 2 para 2 who is seen in consultation at the request of Dr. Quincy Simmonds for persistent high risk HPV and moderate vaginal dysplasia. Patient has a 35 year history of initially cervical, and then vaginal dysplasia. She's had multiple excisional or ablative procedures throughout the years including cryotherapy remotely and a hysterectomy performed in 2013 for symptomatic uterine fibroids in the setting of low-grade cervical dysplasia. She developed vaginal dysplasia post-hysterectomy with a Pap smear in February 2016 showing low-grade squamous intraepithelial lesion and positive for high-risk HPV, and colposcopy in March 2016 revealed moderate grade dysplasia in the left vaginal vault in 2 very small foci one of which was biopsied to remove it and demonstrated VAIN II.  Interval History: The patient has no concerning symptoms including no bleeding or discharge from the vagina, no lower extremity edema, no  back pain, no dyspareunia. She had a pap in September 2016 which showed LSIL with positive hrHPV. She also had colpo in September 2016 with Dr Quincy Simmonds which showed persistent area at the left vaginal cuff that appeared abnormal and was biopsied at 2 sites. One site showed VAIN I and the other showed VAIN II. Dr Quincy Simmonds had recommended laser.  Current Meds:  Outpatient Encounter Prescriptions as of 12/24/2014  Medication Sig  . Calcium Carbonate-Vitamin D (CALTRATE 600+D) 600-400 MG-UNIT per tablet Take 1 tablet by mouth daily.    Marland Kitchen estrogens, conjugated, (PREMARIN) 0.625 MG tablet Take one tablet 0.625 mg by mouth daily.  Marland Kitchen glucosamine-chondroitin 500-400 MG tablet Take 1 tablet by mouth 3 (three) times daily.  . hydrochlorothiazide (MICROZIDE) 12.5 MG capsule Take 12.5 mg by mouth daily.   . metoprolol succinate (TOPROL-XL) 25 MG 24 hr tablet Take 25 mg by mouth daily.  . Multiple Vitamins-Minerals (MULTIVITAMINS THER. W/MINERALS) TABS Take 1 tablet by mouth daily.     No facility-administered encounter medications on file as of 12/24/2014.    Allergy:  Allergies  Allergen Reactions  . Codeine Itching  . Ultram [Tramadol]     Social Hx:   Social History   Social History  . Marital Status: Divorced    Spouse Name: N/A  . Number of Children: N/A  . Years of Education: N/A   Occupational History  . Not on file.   Social History Main Topics  . Smoking status: Never Smoker   . Smokeless tobacco: Never Used  . Alcohol Use: 6.0 -  7.2 oz/week    10-12 Glasses of wine per week     Comment: 10-12 glasses of wine a week  . Drug Use: No  . Sexual Activity: No     Comment: R-TLH   Other Topics Concern  . Not on file   Social History Narrative    Past Surgical Hx:  Past Surgical History  Procedure Laterality Date  . Vaginal delivery  '92 '94  . Lasik  '99  . Cystoscopy  02/23/2011    PT.STATES SHE DID NOT HAVE  . Anterior cervical decomp/discectomy fusion N/A 07/19/2012     Procedure: ANTERIOR CERVICAL DECOMPRESSION/DISCECTOMY FUSION 2 LEVELS;  Surgeon: Erline Levine, MD;  Location: [facility];  Service: Neurosurgery;  Laterality: N/A;  Anterior Cervical Five-Six Cervical Six-Seven Anterior Cervical Decompression/Diskectomy/Fusion  . Abdominal hysterectomy  02/23/11    R TLH-fibroids, adenomyosis 221 g, & focal CIN I w/clear margins  . Tonsillectomy and adenoidectomy      Past Medical Hx:  Past Medical History  Diagnosis Date  . Fibroid 2006    3 cm Right Fundal Fibroid  . ASCUS with positive high risk HPV 12/2006    Negative Colpo BX  . ASCUS with positive high risk HPV 2009  . CIN III (cervical intraepithelial neoplasia III) 07/1983    tx'd w/cryo- no subsequent abnormal paps until 2008  . Hypertension   . GERD (gastroesophageal reflux disease) 2011  . Anxiety   . Essential hypertension   . Vitamin D deficiency   . Synovial cyst of lumbar spine 10/2014    Past Gynecological History:  SVD x 2  Patient's last menstrual period was 02/02/2011.  Family Hx:  Family History  Problem Relation Age of Onset  . Hypertension Mother   . Heart disease Mother   . Cancer Mother     lung  . Hypertension Father   . Heart attack Father     Review of Systems:  Constitutional  Feels well,    ENT Normal appearing ears and nares bilaterally Skin/Breast  No rash, sores, jaundice, itching, dryness Cardiovascular  No chest pain, shortness of breath, or edema  Pulmonary  No cough or wheeze.  Gastro Intestinal  No nausea, vomitting, or diarrhoea. No bright red blood per rectum, no abdominal pain, change in bowel movement, or constipation.  Genito Urinary  No frequency, urgency, dysuria, see above Musculo Skeletal  No myalgia, arthralgia, joint swelling or pain  Neurologic  No weakness, numbness, change in gait,  Psychology  No depression, anxiety, insomnia.   Vitals:  Blood pressure 146/74, pulse 75, temperature 98.3 F (36.8 C), temperature source Oral,  resp. rate 18, height 5\' 7"  (1.702 m), weight 143 lb 6.4 oz (65.046 kg), last menstrual period 02/02/2011, SpO2 99 %.  Physical Exam: deferred   Donaciano Eva, MD   12/24/2014, 12:46 PM

## 2014-12-31 ENCOUNTER — Ambulatory Visit: Payer: 59 | Admitting: Gynecologic Oncology

## 2015-01-09 ENCOUNTER — Ambulatory Visit: Payer: 59 | Admitting: Gynecologic Oncology

## 2015-01-22 ENCOUNTER — Other Ambulatory Visit: Payer: Self-pay | Admitting: Neurosurgery

## 2015-02-01 ENCOUNTER — Encounter (HOSPITAL_COMMUNITY)
Admission: RE | Admit: 2015-02-01 | Discharge: 2015-02-01 | Disposition: A | Discharge status: Home / Self Care | Payer: 59 | Source: Ambulatory Visit | Attending: Neurosurgery | Admitting: Neurosurgery

## 2015-02-01 ENCOUNTER — Encounter (HOSPITAL_COMMUNITY): Payer: Self-pay

## 2015-02-01 DIAGNOSIS — Z01812 Encounter for preprocedural laboratory examination: Secondary | ICD-10-CM | POA: Diagnosis not present

## 2015-02-01 DIAGNOSIS — Z01818 Encounter for other preprocedural examination: Secondary | ICD-10-CM | POA: Insufficient documentation

## 2015-02-01 DIAGNOSIS — M7138 Other bursal cyst, other site: Secondary | ICD-10-CM | POA: Diagnosis not present

## 2015-02-01 HISTORY — DX: Personal history of urinary calculi: Z87.442

## 2015-02-01 HISTORY — DX: Unspecified osteoarthritis, unspecified site: M19.90

## 2015-02-01 LAB — SURGICAL PCR SCREEN
MRSA, PCR: NEGATIVE
STAPHYLOCOCCUS AUREUS: NEGATIVE

## 2015-02-01 LAB — BASIC METABOLIC PANEL
ANION GAP: 9 (ref 5–15)
BUN: 10 mg/dL (ref 6–20)
CALCIUM: 9.2 mg/dL (ref 8.9–10.3)
CO2: 26 mmol/L (ref 22–32)
CREATININE: 0.71 mg/dL (ref 0.44–1.00)
Chloride: 103 mmol/L (ref 101–111)
Glucose, Bld: 94 mg/dL (ref 65–99)
Potassium: 3.7 mmol/L (ref 3.5–5.1)
SODIUM: 138 mmol/L (ref 135–145)

## 2015-02-01 LAB — CBC
HEMATOCRIT: 39.7 % (ref 36.0–46.0)
Hemoglobin: 13.3 g/dL (ref 12.0–15.0)
MCH: 32 pg (ref 26.0–34.0)
MCHC: 33.5 g/dL (ref 30.0–36.0)
MCV: 95.4 fL (ref 78.0–100.0)
PLATELETS: 228 10*3/uL (ref 150–400)
RBC: 4.16 MIL/uL (ref 3.87–5.11)
RDW: 12.4 % (ref 11.5–15.5)
WBC: 5.4 10*3/uL (ref 4.0–10.5)

## 2015-02-01 NOTE — Pre-Procedure Instructions (Addendum)
Debra Nguyen  02/01/2015      [facility] - [place], [place] - [address] [place] [address] Phone: [phone] Fax: [phone]    Your procedure is scheduled on 02/05/15  Report to [facility] Admitting at 530  A.M.  Call this number if you have problems the morning of surgery:  [phone]   Remember:  Do not eat food or drink liquids after midnight.  Take these medicines the morning of surgery with A SIP OF WATER metoprolol    STOP all herbel meds, nsaids (aleve,naproxen,advil,ibuprofen)starting today including vitamins (cal-vit D, multi vitamin) glucosamine-chondroitin   Do not wear jewelry, make-up or nail polish.  Do not wear lotions, powders, or perfumes.  You may wear deodorant.  Do not shave 48 hours prior to surgery.  Men may shave face and neck.  Do not bring valuables to the hospital.  [facility] is not responsible for any belongings or valuables.  Contacts, dentures or bridgework may not be worn into surgery.  Leave your suitcase in the car.  After surgery it may be brought to your room.  For patients admitted to the hospital, discharge time will be determined by your treatment team.  Patients discharged the day of surgery will not be allowed to drive home.   Name and phone number of your driver:    Special instructions:   Special Instructions: [facility] - Preparing for Surgery  Before surgery, you can play an important role.  Because skin is not sterile, your skin needs to be as free of germs as possible.  You can reduce the number of germs on you skin by washing with CHG (chlorahexidine gluconate) soap before surgery.  CHG is an antiseptic cleaner which kills germs and bonds with the skin to continue killing germs even after washing.  Please DO NOT use if you have an allergy to CHG or antibacterial soaps.  If your skin becomes reddened/irritated stop using the CHG and inform your  nurse when you arrive at Short Stay.  Do not shave (including legs and underarms) for at least 48 hours prior to the first CHG shower.  You may shave your face.  Please follow these instructions carefully:   1.  Shower with CHG Soap the night before surgery and the morning of Surgery.  2.  If you choose to wash your hair, wash your hair first as usual with your normal shampoo.  3.  After you shampoo, rinse your hair and body thoroughly to remove the Shampoo.  4.  Use CHG as you would any other liquid soap.  You can apply chg directly  to the skin and wash gently with scrungie or a clean washcloth.  5.  Apply the CHG Soap to your body ONLY FROM THE NECK DOWN.  Do not use on open wounds or open sores.  Avoid contact with your eyes ears, mouth and genitals (private parts).  Wash genitals (private parts)       with your normal soap.  6.  Wash thoroughly, paying special attention to the area where your surgery will be performed.  7.  Thoroughly rinse your body with warm water from the neck down.  8.  DO NOT shower/wash with your normal soap after using and rinsing off the CHG Soap.  9.  Pat yourself dry with a clean towel.            10.  Wear clean pajamas.  11.  Place clean sheets on your bed the night of your first shower and do not sleep with pets.  Day of Surgery  Do not apply any lotions/deodorants the morning of surgery.  Please wear clean clothes to the hospital/surgery center.  Please read over the following fact sheets that you were given. Pain Booklet, Coughing and Deep Breathing, MRSA Information and Surgical Site Infection Prevention

## 2015-02-04 MED ORDER — CEFAZOLIN SODIUM-DEXTROSE 2-3 GM-% IV SOLR
2.0000 g | INTRAVENOUS | Status: AC
  Administered 2015-02-05: 2 g via INTRAVENOUS
  Filled 2015-02-04: qty 50

## 2015-02-04 NOTE — Discharge Instructions (Signed)

## 2015-02-04 NOTE — Anesthesia Preprocedure Evaluation (Addendum)
Anesthesia Evaluation  Patient identified by MRN, date of birth, ID band Patient awake    Reviewed: Allergy & Precautions, NPO status , Patient's Chart, lab work & pertinent test results  Airway Mallampati: II  TM Distance: >3 FB Neck ROM: Full    Dental  (+) Teeth Intact   Pulmonary    breath sounds clear to auscultation       Cardiovascular hypertension,  Rhythm:Regular Rate:Normal     Neuro/Psych    GI/Hepatic   Endo/Other    Renal/GU      Musculoskeletal   Abdominal   Peds  Hematology   Anesthesia Other Findings   Reproductive/Obstetrics                          Anesthesia Physical Anesthesia Plan  ASA: II  Anesthesia Plan: General   Post-op Pain Management:    Induction: Intravenous  Airway Management Planned: Oral ETT  Additional Equipment:   Intra-op Plan:   Post-operative Plan:   Informed Consent: I have reviewed the patients History and Physical, chart, labs and discussed the procedure including the risks, benefits and alternatives for the proposed anesthesia with the patient or authorized representative who has indicated his/her understanding and acceptance.     Plan Discussed with: CRNA and Anesthesiologist  Anesthesia Plan Comments:         Anesthesia Quick Evaluation

## 2015-02-05 ENCOUNTER — Inpatient Hospital Stay (HOSPITAL_COMMUNITY)
Admission: RE | Admit: 2015-02-05 | Discharge: 2015-02-06 | DRG: 520 | Disposition: A | Discharge status: Home / Self Care | Payer: 59 | Source: Ambulatory Visit | Attending: Neurosurgery | Admitting: Neurosurgery

## 2015-02-05 ENCOUNTER — Inpatient Hospital Stay (HOSPITAL_COMMUNITY): Payer: 59

## 2015-02-05 ENCOUNTER — Inpatient Hospital Stay (HOSPITAL_COMMUNITY): Payer: 59 | Admitting: Anesthesiology

## 2015-02-05 ENCOUNTER — Encounter (HOSPITAL_COMMUNITY): Payer: Self-pay | Admitting: *Deleted

## 2015-02-05 ENCOUNTER — Encounter (HOSPITAL_COMMUNITY)
Admission: RE | Disposition: A | Discharge status: Home / Self Care | Payer: Self-pay | Source: Ambulatory Visit | Attending: Neurosurgery

## 2015-02-05 DIAGNOSIS — Z419 Encounter for procedure for purposes other than remedying health state, unspecified: Secondary | ICD-10-CM

## 2015-02-05 DIAGNOSIS — M7138 Other bursal cyst, other site: Secondary | ICD-10-CM | POA: Diagnosis present

## 2015-02-05 DIAGNOSIS — I10 Essential (primary) hypertension: Secondary | ICD-10-CM | POA: Diagnosis present

## 2015-02-05 DIAGNOSIS — M4806 Spinal stenosis, lumbar region: Principal | ICD-10-CM | POA: Diagnosis present

## 2015-02-05 DIAGNOSIS — M549 Dorsalgia, unspecified: Secondary | ICD-10-CM | POA: Diagnosis present

## 2015-02-05 HISTORY — PX: LUMBAR LAMINECTOMY/DECOMPRESSION MICRODISCECTOMY: SHX5026

## 2015-02-05 SURGERY — LUMBAR LAMINECTOMY/DECOMPRESSION MICRODISCECTOMY 1 LEVEL
Anesthesia: General | Site: Back | Laterality: Right

## 2015-02-05 MED ORDER — FENTANYL CITRATE (PF) 100 MCG/2ML IJ SOLN
INTRAMUSCULAR | Status: DC | PRN
  Administered 2015-02-05: 100 ug via INTRAVENOUS

## 2015-02-05 MED ORDER — ACETAMINOPHEN 500 MG PO TABS
1000.0000 mg | ORAL_TABLET | Freq: Once | ORAL | Status: DC
  Filled 2015-02-05: qty 2

## 2015-02-05 MED ORDER — PANTOPRAZOLE SODIUM 40 MG IV SOLR: 40.0000 mg | Freq: Every day | INTRAVENOUS | Status: DC

## 2015-02-05 MED ORDER — ACETAMINOPHEN 650 MG RE SUPP: 650.0000 mg | RECTAL | Status: DC | PRN

## 2015-02-05 MED ORDER — OXYCODONE HCL 5 MG PO TABS
5.0000 mg | ORAL_TABLET | Freq: Once | ORAL | Status: AC | PRN
  Administered 2015-02-05: 5 mg via ORAL

## 2015-02-05 MED ORDER — SODIUM CHLORIDE 0.9 % IJ SOLN
INTRAMUSCULAR | Status: AC
  Filled 2015-02-05: qty 10

## 2015-02-05 MED ORDER — ROCURONIUM BROMIDE 100 MG/10ML IV SOLN
INTRAVENOUS | Status: DC | PRN
  Administered 2015-02-05: 50 mg via INTRAVENOUS

## 2015-02-05 MED ORDER — DIPHENHYDRAMINE HCL 25 MG PO CAPS
25.0000 mg | ORAL_CAPSULE | Freq: Four times a day (QID) | ORAL | Status: DC | PRN
  Filled 2015-02-05: qty 1

## 2015-02-05 MED ORDER — ALUM & MAG HYDROXIDE-SIMETH 200-200-20 MG/5ML PO SUSP: 30.0000 mL | Freq: Four times a day (QID) | ORAL | Status: DC | PRN

## 2015-02-05 MED ORDER — GLYCOPYRROLATE 0.2 MG/ML IJ SOLN
INTRAMUSCULAR | Status: DC | PRN
  Administered 2015-02-05: 0.4 mg via INTRAVENOUS

## 2015-02-05 MED ORDER — MIDAZOLAM HCL 2 MG/2ML IJ SOLN
INTRAMUSCULAR | Status: AC
  Filled 2015-02-05: qty 2

## 2015-02-05 MED ORDER — METOPROLOL SUCCINATE ER 25 MG PO TB24: 25.0000 mg | ORAL_TABLET | Freq: Every day | ORAL | Status: DC

## 2015-02-05 MED ORDER — DEXAMETHASONE SODIUM PHOSPHATE 10 MG/ML IJ SOLN
INTRAMUSCULAR | Status: AC
  Filled 2015-02-05: qty 1

## 2015-02-05 MED ORDER — LACTATED RINGERS IV SOLN
INTRAVENOUS | Status: DC | PRN
  Administered 2015-02-05 (×2): via INTRAVENOUS

## 2015-02-05 MED ORDER — DOCUSATE SODIUM 100 MG PO CAPS
100.0000 mg | ORAL_CAPSULE | Freq: Two times a day (BID) | ORAL | Status: DC
  Filled 2015-02-05: qty 1

## 2015-02-05 MED ORDER — DEXAMETHASONE SODIUM PHOSPHATE 10 MG/ML IJ SOLN
INTRAMUSCULAR | Status: DC | PRN
  Administered 2015-02-05: 10 mg via INTRAVENOUS

## 2015-02-05 MED ORDER — ONDANSETRON HCL 4 MG/2ML IJ SOLN
INTRAMUSCULAR | Status: AC
  Filled 2015-02-05: qty 2

## 2015-02-05 MED ORDER — CEFAZOLIN SODIUM 1-5 GM-% IV SOLN
1.0000 g | Freq: Three times a day (TID) | INTRAVENOUS | Status: AC
  Administered 2015-02-05 (×2): 1 g via INTRAVENOUS
  Filled 2015-02-05 (×2): qty 50

## 2015-02-05 MED ORDER — HYDROMORPHONE HCL 1 MG/ML IJ SOLN
INTRAMUSCULAR | Status: AC
  Filled 2015-02-05: qty 1

## 2015-02-05 MED ORDER — PHENOL 1.4 % MT LIQD: 1.0000 | OROMUCOSAL | Status: DC | PRN

## 2015-02-05 MED ORDER — ONDANSETRON HCL 4 MG/2ML IJ SOLN
4.0000 mg | INTRAMUSCULAR | Status: DC | PRN
  Administered 2015-02-05: 4 mg via INTRAVENOUS
  Filled 2015-02-05: qty 2

## 2015-02-05 MED ORDER — LIDOCAINE HCL 4 % EX SOLN
CUTANEOUS | Status: DC | PRN
  Administered 2015-02-05: 4 mL via TOPICAL

## 2015-02-05 MED ORDER — OXYCODONE-ACETAMINOPHEN 5-325 MG PO TABS: 1.0000 | ORAL_TABLET | ORAL | Status: DC | PRN

## 2015-02-05 MED ORDER — OXYCODONE HCL 5 MG PO TABS
ORAL_TABLET | ORAL | Status: AC
  Filled 2015-02-05: qty 1

## 2015-02-05 MED ORDER — PHENYLEPHRINE 40 MCG/ML (10ML) SYRINGE FOR IV PUSH (FOR BLOOD PRESSURE SUPPORT)
PREFILLED_SYRINGE | INTRAVENOUS | Status: AC
  Filled 2015-02-05: qty 10

## 2015-02-05 MED ORDER — BUPIVACAINE HCL (PF) 0.5 % IJ SOLN
INTRAMUSCULAR | Status: DC | PRN
  Administered 2015-02-05: 5 mL

## 2015-02-05 MED ORDER — THROMBIN 5000 UNITS EX SOLR
CUTANEOUS | Status: DC | PRN
  Administered 2015-02-05 (×2): 5000 [IU] via TOPICAL

## 2015-02-05 MED ORDER — FENTANYL CITRATE (PF) 100 MCG/2ML IJ SOLN
INTRAMUSCULAR | Status: AC
  Filled 2015-02-05: qty 2

## 2015-02-05 MED ORDER — POLYETHYLENE GLYCOL 3350 17 G PO PACK: 17.0000 g | PACK | Freq: Every day | ORAL | Status: DC | PRN

## 2015-02-05 MED ORDER — EPHEDRINE SULFATE 50 MG/ML IJ SOLN
INTRAMUSCULAR | Status: AC
  Filled 2015-02-05: qty 1

## 2015-02-05 MED ORDER — ADULT MULTIVITAMIN W/MINERALS CH: 1.0000 | ORAL_TABLET | Freq: Every day | ORAL | Status: DC

## 2015-02-05 MED ORDER — PROPOFOL 10 MG/ML IV BOLUS
INTRAVENOUS | Status: DC | PRN
  Administered 2015-02-05: 140 mg via INTRAVENOUS

## 2015-02-05 MED ORDER — OXYCODONE HCL 5 MG/5ML PO SOLN: 5.0000 mg | Freq: Once | ORAL | Status: AC | PRN

## 2015-02-05 MED ORDER — GLYCOPYRROLATE 0.2 MG/ML IJ SOLN
INTRAMUSCULAR | Status: AC
  Filled 2015-02-05: qty 2

## 2015-02-05 MED ORDER — FENTANYL CITRATE (PF) 250 MCG/5ML IJ SOLN
INTRAMUSCULAR | Status: AC
  Filled 2015-02-05: qty 5

## 2015-02-05 MED ORDER — SODIUM CHLORIDE 0.9 % IJ SOLN: 3.0000 mL | INTRAMUSCULAR | Status: DC | PRN

## 2015-02-05 MED ORDER — ROCURONIUM BROMIDE 50 MG/5ML IV SOLN
INTRAVENOUS | Status: AC
  Filled 2015-02-05: qty 1

## 2015-02-05 MED ORDER — ZOLPIDEM TARTRATE 5 MG PO TABS: 5.0000 mg | ORAL_TABLET | Freq: Every evening | ORAL | Status: DC | PRN

## 2015-02-05 MED ORDER — SODIUM CHLORIDE 0.9 % IJ SOLN: 3.0000 mL | Freq: Two times a day (BID) | INTRAMUSCULAR | Status: DC

## 2015-02-05 MED ORDER — CALCIUM CARBONATE-VITAMIN D 500-200 MG-UNIT PO TABS
1.0000 | ORAL_TABLET | Freq: Every day | ORAL | Status: DC
Start: 2015-02-05 — End: 2015-02-06

## 2015-02-05 MED ORDER — HEMOSTATIC AGENTS (NO CHARGE) OPTIME
TOPICAL | Status: DC | PRN
  Administered 2015-02-05: 1 via TOPICAL

## 2015-02-05 MED ORDER — 0.9 % SODIUM CHLORIDE (POUR BTL) OPTIME
TOPICAL | Status: DC | PRN
  Administered 2015-02-05: 1000 mL

## 2015-02-05 MED ORDER — BISACODYL 10 MG RE SUPP: 10.0000 mg | Freq: Every day | RECTAL | Status: DC | PRN

## 2015-02-05 MED ORDER — MENTHOL 3 MG MT LOZG: 1.0000 | LOZENGE | OROMUCOSAL | Status: DC | PRN

## 2015-02-05 MED ORDER — LIDOCAINE HCL (CARDIAC) 20 MG/ML IV SOLN
INTRAVENOUS | Status: AC
  Filled 2015-02-05: qty 5

## 2015-02-05 MED ORDER — PROMETHAZINE HCL 25 MG/ML IJ SOLN
12.5000 mg | Freq: Four times a day (QID) | INTRAMUSCULAR | Status: DC | PRN
  Administered 2015-02-05: 12.5 mg via INTRAVENOUS
  Filled 2015-02-05: qty 1

## 2015-02-05 MED ORDER — LIDOCAINE-EPINEPHRINE 1 %-1:100000 IJ SOLN
INTRAMUSCULAR | Status: DC | PRN
  Administered 2015-02-05: 5 mL

## 2015-02-05 MED ORDER — HYDROCODONE-ACETAMINOPHEN 5-325 MG PO TABS
1.0000 | ORAL_TABLET | ORAL | Status: DC | PRN
  Administered 2015-02-05: 1 via ORAL
  Filled 2015-02-05: qty 1

## 2015-02-05 MED ORDER — NEOSTIGMINE METHYLSULFATE 10 MG/10ML IV SOLN
INTRAVENOUS | Status: AC
  Filled 2015-02-05: qty 1

## 2015-02-05 MED ORDER — HYDROMORPHONE HCL 1 MG/ML IJ SOLN: 0.5000 mg | INTRAMUSCULAR | Status: DC | PRN

## 2015-02-05 MED ORDER — ONDANSETRON HCL 4 MG/2ML IJ SOLN
INTRAMUSCULAR | Status: DC | PRN
  Administered 2015-02-05: 4 mg via INTRAVENOUS

## 2015-02-05 MED ORDER — METHYLPREDNISOLONE ACETATE 80 MG/ML IJ SUSP
INTRAMUSCULAR | Status: DC | PRN
  Administered 2015-02-05: 80 mg

## 2015-02-05 MED ORDER — PROPOFOL 10 MG/ML IV BOLUS
INTRAVENOUS | Status: AC
  Filled 2015-02-05: qty 20

## 2015-02-05 MED ORDER — FENTANYL CITRATE (PF) 100 MCG/2ML IJ SOLN
INTRAMUSCULAR | Status: DC | PRN
  Administered 2015-02-05 (×3): 100 ug via INTRAVENOUS
  Administered 2015-02-05 (×2): 50 ug via INTRAVENOUS
  Administered 2015-02-05: 100 ug via INTRAVENOUS

## 2015-02-05 MED ORDER — METHOCARBAMOL 1000 MG/10ML IJ SOLN
500.0000 mg | Freq: Four times a day (QID) | INTRAMUSCULAR | Status: DC | PRN
  Filled 2015-02-05: qty 5

## 2015-02-05 MED ORDER — FLEET ENEMA 7-19 GM/118ML RE ENEM: 1.0000 | ENEMA | Freq: Once | RECTAL | Status: DC | PRN

## 2015-02-05 MED ORDER — LIDOCAINE HCL (CARDIAC) 20 MG/ML IV SOLN
INTRAVENOUS | Status: DC | PRN
  Administered 2015-02-05: 40 mg via INTRAVENOUS

## 2015-02-05 MED ORDER — ONDANSETRON HCL 4 MG/2ML IJ SOLN: 4.0000 mg | Freq: Once | INTRAMUSCULAR | Status: DC | PRN

## 2015-02-05 MED ORDER — METHOCARBAMOL 500 MG PO TABS
500.0000 mg | ORAL_TABLET | Freq: Four times a day (QID) | ORAL | Status: DC | PRN
  Administered 2015-02-05 (×2): 500 mg via ORAL
  Filled 2015-02-05: qty 1

## 2015-02-05 MED ORDER — ACETAMINOPHEN 325 MG PO TABS: 650.0000 mg | ORAL_TABLET | ORAL | Status: DC | PRN

## 2015-02-05 MED ORDER — METHOCARBAMOL 500 MG PO TABS
ORAL_TABLET | ORAL | Status: AC
  Filled 2015-02-05: qty 1

## 2015-02-05 MED ORDER — MIDAZOLAM HCL 5 MG/5ML IJ SOLN
INTRAMUSCULAR | Status: DC | PRN
  Administered 2015-02-05 (×2): 2 mg via INTRAVENOUS

## 2015-02-05 MED ORDER — NEOSTIGMINE METHYLSULFATE 10 MG/10ML IV SOLN
INTRAVENOUS | Status: DC | PRN
  Administered 2015-02-05: 3 mg via INTRAVENOUS

## 2015-02-05 MED ORDER — KCL IN DEXTROSE-NACL 20-5-0.45 MEQ/L-%-% IV SOLN: INTRAVENOUS | Status: DC

## 2015-02-05 MED ORDER — HYDROCHLOROTHIAZIDE 12.5 MG PO CAPS
12.5000 mg | ORAL_CAPSULE | Freq: Every day | ORAL | Status: DC
  Administered 2015-02-05: 12.5 mg via ORAL
  Filled 2015-02-05: qty 1

## 2015-02-05 MED ORDER — ESTROGENS CONJUGATED 0.625 MG PO TABS
0.6250 mg | ORAL_TABLET | Freq: Every day | ORAL | Status: DC
Start: 2015-02-05 — End: 2015-02-06
  Filled 2015-02-05 (×2): qty 1

## 2015-02-05 MED ORDER — IBUPROFEN 200 MG PO TABS: 600.0000 mg | ORAL_TABLET | Freq: Four times a day (QID) | ORAL | Status: DC | PRN

## 2015-02-05 MED ORDER — HYDROMORPHONE HCL 1 MG/ML IJ SOLN
0.2500 mg | INTRAMUSCULAR | Status: DC | PRN
  Administered 2015-02-05 (×4): 0.5 mg via INTRAVENOUS

## 2015-02-05 SURGICAL SUPPLY — 72 items
ADH SKN CLS APL DERMABOND .7 (GAUZE/BANDAGES/DRESSINGS) ×2
APL SKNCLS STERI-STRIP NONHPOA (GAUZE/BANDAGES/DRESSINGS)
BENZOIN TINCTURE PRP APPL 2/3 (GAUZE/BANDAGES/DRESSINGS) IMPLANT
BLADE CLIPPER SURG (BLADE) IMPLANT
BLADE ULTRA TIP 2M (BLADE) IMPLANT
BUR MATCHSTICK NEURO 3.0 LAGG (BURR) ×4 IMPLANT
BUR ROUND FLUTED 5 RND (BURR) ×3 IMPLANT
BUR ROUND FLUTED 5MM RND (BURR) ×1
CANISTER SUCT 3000ML PPV (MISCELLANEOUS) ×4 IMPLANT
CLOSURE WOUND 1/2 X4 (GAUZE/BANDAGES/DRESSINGS)
DECANTER SPIKE VIAL GLASS SM (MISCELLANEOUS) ×4 IMPLANT
DERMABOND ADVANCED (GAUZE/BANDAGES/DRESSINGS) ×2
DERMABOND ADVANCED .7 DNX12 (GAUZE/BANDAGES/DRESSINGS) ×2 IMPLANT
DRAPE LAPAROTOMY 100X72 PEDS (DRAPES) IMPLANT
DRAPE LAPAROTOMY 100X72X124 (DRAPES) ×4 IMPLANT
DRAPE MICROSCOPE LEICA (MISCELLANEOUS) ×4 IMPLANT
DRAPE POUCH INSTRU U-SHP 10X18 (DRAPES) ×4 IMPLANT
DRAPE SURG 17X23 STRL (DRAPES) ×4 IMPLANT
DRSG OPSITE POSTOP 3X4 (GAUZE/BANDAGES/DRESSINGS) ×4 IMPLANT
DURAPREP 26ML APPLICATOR (WOUND CARE) ×4 IMPLANT
ELECT REM PT RETURN 9FT ADLT (ELECTROSURGICAL) ×4
ELECTRODE REM PT RTRN 9FT ADLT (ELECTROSURGICAL) ×2 IMPLANT
GAUZE SPONGE 4X4 12PLY STRL (GAUZE/BANDAGES/DRESSINGS) IMPLANT
GAUZE SPONGE 4X4 16PLY XRAY LF (GAUZE/BANDAGES/DRESSINGS) IMPLANT
GLOVE BIO SURGEON STRL SZ8 (GLOVE) ×4 IMPLANT
GLOVE BIOGEL PI IND STRL 7.0 (GLOVE) ×8 IMPLANT
GLOVE BIOGEL PI IND STRL 8 (GLOVE) ×2 IMPLANT
GLOVE BIOGEL PI IND STRL 8.5 (GLOVE) ×2 IMPLANT
GLOVE BIOGEL PI INDICATOR 7.0 (GLOVE) ×8
GLOVE BIOGEL PI INDICATOR 8 (GLOVE) ×2
GLOVE BIOGEL PI INDICATOR 8.5 (GLOVE) ×2
GLOVE ECLIPSE 8.0 STRL XLNG CF (GLOVE) ×8 IMPLANT
GLOVE EXAM NITRILE LRG STRL (GLOVE) IMPLANT
GLOVE EXAM NITRILE MD LF STRL (GLOVE) IMPLANT
GLOVE EXAM NITRILE XL STR (GLOVE) IMPLANT
GLOVE EXAM NITRILE XS STR PU (GLOVE) IMPLANT
GOWN STRL REUS W/ TWL LRG LVL3 (GOWN DISPOSABLE) ×2 IMPLANT
GOWN STRL REUS W/ TWL XL LVL3 (GOWN DISPOSABLE) ×2 IMPLANT
GOWN STRL REUS W/TWL 2XL LVL3 (GOWN DISPOSABLE) ×4 IMPLANT
GOWN STRL REUS W/TWL LRG LVL3 (GOWN DISPOSABLE) ×4
GOWN STRL REUS W/TWL XL LVL3 (GOWN DISPOSABLE) ×4
HEMOSTAT SURGICEL 2X14 (HEMOSTASIS) IMPLANT
KIT BASIN OR (CUSTOM PROCEDURE TRAY) ×4 IMPLANT
KIT ROOM TURNOVER OR (KITS) ×4 IMPLANT
MARKER SKIN DUAL TIP RULER LAB (MISCELLANEOUS) IMPLANT
NEEDLE HYPO 18GX1.5 BLUNT FILL (NEEDLE) ×4 IMPLANT
NEEDLE HYPO 25X1 1.5 SAFETY (NEEDLE) ×4 IMPLANT
NEEDLE SPNL 18GX3.5 QUINCKE PK (NEEDLE) ×4 IMPLANT
NEEDLE SPNL 22GX3.5 QUINCKE BK (NEEDLE) ×8 IMPLANT
NS IRRIG 1000ML POUR BTL (IV SOLUTION) ×4 IMPLANT
PACK LAMINECTOMY NEURO (CUSTOM PROCEDURE TRAY) ×4 IMPLANT
PATTIES SURGICAL .25X.25 (GAUZE/BANDAGES/DRESSINGS) IMPLANT
PATTIES SURGICAL .5 X.5 (GAUZE/BANDAGES/DRESSINGS) IMPLANT
PATTIES SURGICAL .5 X3 (DISPOSABLE) IMPLANT
RUBBERBAND STERILE (MISCELLANEOUS) ×8 IMPLANT
SPONGE LAP 4X18 X RAY DECT (DISPOSABLE) IMPLANT
SPONGE NEURO XRAY DETECT 1X3 (DISPOSABLE) IMPLANT
SPONGE SURGIFOAM ABS GEL 100 (HEMOSTASIS) IMPLANT
SPONGE SURGIFOAM ABS GEL SZ50 (HEMOSTASIS) ×4 IMPLANT
STAPLER SKIN PROX WIDE 3.9 (STAPLE) IMPLANT
STRIP CLOSURE SKIN 1/2X4 (GAUZE/BANDAGES/DRESSINGS) IMPLANT
SUT NURALON 4 0 TR CR/8 (SUTURE) IMPLANT
SUT SILK 6 0 BV 1XDISCX (SUTURE) IMPLANT
SUT VIC AB 0 CT1 18XCR BRD8 (SUTURE) ×2 IMPLANT
SUT VIC AB 0 CT1 8-18 (SUTURE) ×4
SUT VIC AB 2-0 CT1 18 (SUTURE) ×4 IMPLANT
SUT VIC AB 3-0 SH 8-18 (SUTURE) ×4 IMPLANT
SYR 5ML LL (SYRINGE) ×4 IMPLANT
TOWEL OR 17X24 6PK STRL BLUE (TOWEL DISPOSABLE) ×4 IMPLANT
TOWEL OR 17X26 10 PK STRL BLUE (TOWEL DISPOSABLE) ×4 IMPLANT
TRAY FOLEY W/METER SILVER 14FR (SET/KITS/TRAYS/PACK) IMPLANT
WATER STERILE IRR 1000ML POUR (IV SOLUTION) ×4 IMPLANT

## 2015-02-05 NOTE — Progress Notes (Signed)
Utilization review completed.  

## 2015-02-05 NOTE — Progress Notes (Signed)
Dr. Linna Caprice made aware of BP. Patient reported being itchy this am after CHG use. Patient reports that she has dry skin and that this feel like it is due to her skin being dry. No rash or other signs of allergic reaction noted.

## 2015-02-05 NOTE — Transfer of Care (Signed)
Immediate Anesthesia Transfer of Care Note  Patient: Debra Nguyen  Procedure(s) Performed: Procedure(s): Right Lumbar four-five Laminectomy with Resection of Synovial Cyst  (Right)  Patient Location: PACU  Anesthesia Type:General  Level of Consciousness: awake, alert , oriented and patient cooperative  Airway & Oxygen Therapy: Patient Spontanous Breathing  Post-op Assessment: Report given to RN, Post -op Vital signs reviewed and stable and Patient moving all extremities  Post vital signs: Reviewed and stable  Last Vitals:  Filed Vitals:   02/05/15 0642  BP: 174/93  Pulse: 78  Temp: 36.9 C  Resp: 20    Complications: No apparent anesthesia complications

## 2015-02-05 NOTE — Progress Notes (Signed)
Awake, alert, conversant.  MAEW with good strength.  Leg pain improved.  Doing well.

## 2015-02-05 NOTE — Op Note (Signed)
02/05/2015  9:39 AM  PATIENT:  Debra Nguyen  59 y.o. female  PRE-OPERATIVE DIAGNOSIS:  Synovial cyst of lumbar facet joint Right L 45 with right L 4 radiculopathy, spondylolisthesis, stenosis  POST-OPERATIVE DIAGNOSIS:  Synovial cyst of lumbar facet joint Right L 45 with right L 4 radiculopathy, spondylolisthesis, stenosis  PROCEDURE:  Procedure(s): Right Lumbar four-five Laminectomy with Resection of Synovial Cyst  (Right) with microdissection  SURGEON:  Surgeon(s) and Role:    * Erline Levine, MD - Primary    * Consuella Lose, MD - Assisting  PHYSICIAN ASSISTANT:   ASSISTANTS: Poteat, RN   ANESTHESIA:   general  EBL:   Min  BLOOD ADMINISTERED:none  DRAINS: none   LOCAL MEDICATIONS USED:  MARCAINE    and LIDOCAINE   SPECIMEN:  No Specimen  DISPOSITION OF SPECIMEN:  N/A  COUNTS:  YES  TOURNIQUET:  * No tourniquets in log *  DICTATION: Patient has a synovial cyst at L 4 - 5 on the right with Grade I spondylolisthesis with a right L 4 radiculopathy. It was elected to take her to surgery for right L4-5 resection of synovial cyst.  Procedure: Patient was brought to the operating room and following the smooth and uncomplicated induction of general endotracheal anesthesia she was placed in a prone position on the Wilson frame. Low back was prepped and draped in the usual sterile fashion with betadine scrub and DuraPrep. Area of planned incision was infiltrated with local lidocaine. Localizing radiograph was obtained.  Incision was made in the midline and carried to the lumbodorsal fascia which was incised on the right side of midline. Subperiosteal dissection was performed exposing what was felt to be L45 level. Intraoperative x-ray demonstrated marker probe at L4-5.  A hemi-semi-laminectomy of L4 was performed a high-speed drill and completed with Kerrison rongeurs and a generous foraminotomy was performed overlying the superior aspect of the L5 lamina. Ligamentum flavum  was detached and removed in a piecemeal fashion and the L4 nerve root was decompressed laterally as it extended out the neural foramen with removal of the superior aspect of the facet and ligamentum causing nerve root compression. The microscope was brought into the field and the L 4 nerve root was mobilized medially and carefully dissected from the synovial cyst which was adherent to the dura.  Using microdissection, the cyst was removed and neural elements thoroughly decompressed. Angled curets and Kerisons were used to decompress the nerve root and a variety of ball probes were passed out the neural foramen.  An additional X - ray was obtained with marker probe at the level of the right L 4 pedicle to confirm that decompression had been carried sufficiently cephalad.  There was no violation of the dura.  Hemostasis was assured with bipolar electrocautery and gelfoam and the interspace was irrigated with Depo-Medrol and fentanyl. The lumbodorsal fascia was closed with 0 Vicryl sutures the subcutaneous tissues reapproximated 2-0 Vicryl inverted sutures and the skin edges were reapproximated with 3-0 Vicryl subcuticular stitch. The wound is dressed with Dermabond. Patient was extubated in the operating room and taken to recovery in stable and satisfactory condition having tolerated his operation well counts were correct at the end of the case.  PLAN OF CARE: Admit to inpatient   PATIENT DISPOSITION:  PACU - hemodynamically stable.   Delay start of Pharmacological VTE agent (>24hrs) due to surgical blood loss or risk of bleeding: yes

## 2015-02-05 NOTE — H&P (Signed)
Patient ID:   [id number] Patient: Debra Nguyen  Date of Birth: 1955/10/17 Visit Type: Office Visit   Date: 01/21/2015 12:15 PM Provider: Marchia Meiers. Vertell Limber MD   This 59 year old female presents for back pain.  History of Present Illness: 1.  back pain  She returns, reporting increased pain right buttock and right thigh four weeks ago.  She states she had been doing very well since her May visit until resuming yoga and spin classes.  Severe pain has decreased after Robaxin and heat constant "ache" continues, preventing activity.  Limp persists.  Robaxin 500 mg as needed Ibuprofen 800 mg as needed  Percocet rarely  The patient is very frustrated with her deterioration of pain and gait.  She is having a lot of difficulty going up and down stairs.  She has right sciatic notch discomfort and positive straight leg raise on the right.  Whereas previously the patient felt that she was improving with conservative management, she now feels that she is worsening and wants to go ahead and get treatment as she is worried that her leg is getting weaker.  She reviewed her films with Dr. Ellene Route and he reviewed them with me as well I do think that a right L4 L5 laminectomy with resection of synovial cyst is warranted and could be performed given the severity of her complaints.  I do not think she would require fusion surgery at this point.  My impression previously was more that the risk of progressive instability might warrant fusion in the future that she should undergo fusion surgery at the present time.  Since she was getting better we elected to hold off on further treatment but since she is now deteriorating I think it makes sense to proceed with resection of the synovial cyst.       PAST MEDICAL HISTORY, SURGICAL HISTORY, FAMILY HISTORY, SOCIAL HISTORY AND REVIEW OF SYSTEMS I have reviewed the patient's past medical, surgical, family and social history as well as the comprehensive review of  systems as included on the [facility] history form dated 07/02/2014, which I have signed.   MEDICATIONS(added, continued or stopped this visit): Started Medication Directions Instruction Stopped   Flexeril 10 mg tablet Take at bedtime as needed     hydrochlorothiazide 12.5 mg tablet take 1 tablet by oral route  every day     ibuprofen 800 mg tablet take 1 tablet by oral route 3 times every day with food    06/29/2012 Percocet 5 mg-325 mg tablet take 1-2 tablets by oral route  every 4 hours as needed for pain     Premarin 0.625 mg tablet take 1 tablet by oral route  every day    01/08/2015 Robaxin 500 mg tablet take 1 tablet by oral route 3 times every day     Toprol XL 25 mg tablet,extended release take 1 tablet by oral route  every day       ALLERGIES: Ingredient Reaction Medication Name Comment  CODEINE Itching        Vitals Date Temp F BP Pulse Ht In Wt Lb BMI BSA Pain Score  01/21/2015  170/91 77 67 142 22.24  4/10      IMPRESSION Progressive worsening due to right L4 L5 synovial cyst with right L4 nerve root compression  Assessment/Plan # Detail Type Description   1. Assessment Spondylolisthesis, lumbar region (M43.16).       2. Assessment Spinal stenosis of lumbar region (M48.06).  3. Assessment Synovial cyst of lumbar facet joint (M71.38).       4. Assessment Radiculopathy, lumbar region (M54.16).         Pain Assessment/Treatment Location: back. Duration: varies. Quality: discomforting. Pain Assessment/Treatment follow-up plan of care: Patient is taking medications as prescribed..  Proceed with laminectomy and resection of synovial cyst.  Risks and benefits were discussed in detail with the patient as well as patient teaching was performed.  She wishes to proceed.  Orders: Diagnostic Procedures: Assessment Procedure  M71.38 Laminectomy - right - L4-L5 with resection of synovial cyst             Provider:  Marchia Meiers. Vertell Limber MD  01/26/2015 04:28 PM Dictation edited by: Marchia Meiers. Vertell Limber    CC Providers: Erline Levine MD [address] [place], [place] [address]              Electronically signed by Marchia Meiers. Vertell Limber MD on 01/26/2015 04:29 PM

## 2015-02-05 NOTE — Anesthesia Postprocedure Evaluation (Signed)
Anesthesia Post Note  Patient: Debra Nguyen  Procedure(s) Performed: Procedure(s) (LRB): Right Lumbar four-five Laminectomy with Resection of Synovial Cyst  (Right)  Patient location during evaluation: PACU Anesthesia Type: General Level of consciousness: awake and awake and alert Pain management: pain level controlled Vital Signs Assessment: post-procedure vital signs reviewed and stable Respiratory status: spontaneous breathing and nonlabored ventilation Anesthetic complications: no    Last Vitals:  Filed Vitals:   02/05/15 1048 02/05/15 1115  BP:  138/79  Pulse: 65 94  Temp: 36.1 C 36.1 C  Resp: 12 20    Last Pain:  Filed Vitals:   02/05/15 1139  PainSc: Asleep                 Kaesha Kirsch COKER

## 2015-02-05 NOTE — Interval H&P Note (Signed)
History and Physical Interval Note:  02/05/2015 7:11 AM  Debra Nguyen  has presented today for surgery, with the diagnosis of Synovial cyst of lumbar facet joint  The various methods of treatment have been discussed with the patient and family. After consideration of risks, benefits and other options for treatment, the patient has consented to  Procedure(s) with comments: Right L4-5 Laminectomy for resection of synovial cyst (Right) - Right L4-5 Laminectomy for resection of synovial cyst as a surgical intervention .  The patient's history has been reviewed, patient examined, no change in status, stable for surgery.  I have reviewed the patient's chart and labs.  Questions were answered to the patient's satisfaction.     Myrian Botello D

## 2015-02-05 NOTE — Brief Op Note (Signed)
02/05/2015  9:39 AM  PATIENT:  Debra Nguyen  59 y.o. female  PRE-OPERATIVE DIAGNOSIS:  Synovial cyst of lumbar facet joint Right L 45 with right L 4 radiculopathy, spondylolisthesis, stenosis  POST-OPERATIVE DIAGNOSIS:  Synovial cyst of lumbar facet joint Right L 45 with right L 4 radiculopathy, spondylolisthesis, stenosis  PROCEDURE:  Procedure(s): Right Lumbar four-five Laminectomy with Resection of Synovial Cyst  (Right) with microdissection  SURGEON:  Surgeon(s) and Role:    * Erline Levine, MD - Primary    * Consuella Lose, MD - Assisting  PHYSICIAN ASSISTANT:   ASSISTANTS: Poteat, RN   ANESTHESIA:   general  EBL:   Min  BLOOD ADMINISTERED:none  DRAINS: none   LOCAL MEDICATIONS USED:  MARCAINE    and LIDOCAINE   SPECIMEN:  No Specimen  DISPOSITION OF SPECIMEN:  N/A  COUNTS:  YES  TOURNIQUET:  * No tourniquets in log *  DICTATION: Patient has a synovial cyst at L 4 - 5 on the right with Grade I spondylolisthesis with a right L 4 radiculopathy. It was elected to take her to surgery for right L4-5 resection of synovial cyst.  Procedure: Patient was brought to the operating room and following the smooth and uncomplicated induction of general endotracheal anesthesia she was placed in a prone position on the Wilson frame. Low back was prepped and draped in the usual sterile fashion with betadine scrub and DuraPrep. Area of planned incision was infiltrated with local lidocaine. Localizing radiograph was obtained.  Incision was made in the midline and carried to the lumbodorsal fascia which was incised on the right side of midline. Subperiosteal dissection was performed exposing what was felt to be L45 level. Intraoperative x-ray demonstrated marker probe at L4-5.  A hemi-semi-laminectomy of L4 was performed a high-speed drill and completed with Kerrison rongeurs and a generous foraminotomy was performed overlying the superior aspect of the L5 lamina. Ligamentum flavum  was detached and removed in a piecemeal fashion and the L4 nerve root was decompressed laterally as it extended out the neural foramen with removal of the superior aspect of the facet and ligamentum causing nerve root compression. The microscope was brought into the field and the L 4 nerve root was mobilized medially and carefully dissected from the synovial cyst which was adherent to the dura.  Using microdissection, the cyst was removed and neural elements thoroughly decompressed. Angled curets and Kerisons were used to decompress the nerve root and a variety of ball probes were passed out the neural foramen.  An additional X - ray was obtained with marker probe at the level of the right L 4 pedicle to confirm that decompression had been carried sufficiently cephalad.  There was no violation of the dura.  Hemostasis was assured with bipolar electrocautery and gelfoam and the interspace was irrigated with Depo-Medrol and fentanyl. The lumbodorsal fascia was closed with 0 Vicryl sutures the subcutaneous tissues reapproximated 2-0 Vicryl inverted sutures and the skin edges were reapproximated with 3-0 Vicryl subcuticular stitch. The wound is dressed with Dermabond. Patient was extubated in the operating room and taken to recovery in stable and satisfactory condition having tolerated his operation well counts were correct at the end of the case.  PLAN OF CARE: Admit to inpatient   PATIENT DISPOSITION:  PACU - hemodynamically stable.   Delay start of Pharmacological VTE agent (>24hrs) due to surgical blood loss or risk of bleeding: yes

## 2015-02-05 NOTE — Evaluation (Addendum)
Occupational Therapy Evaluation Patient Details Name: Britainy Nemeroff MRN: [id number] DOB: 1955-06-16 Today's Date: 02/05/2015    History of Present Illness 59 y.o. s/p Right Lumbar four-five Laminectomy with Resection of Synovial Cyst (Right) with microdissection.   Clinical Impression   Pt s/p above. Education provided in session and pt does not want another OT session, so OT signing off. Pt will have family to assist the rest of this week. Back handout given to pt. Pt became nauseous in session.     Follow Up Recommendations  No OT follow up;Supervision - Intermittent    Equipment Recommendations  None recommended by OT    Recommendations for Other Services       Precautions / Restrictions Precautions Precautions: Back;Fall Precaution Booklet Issued: Yes (comment) Precaution Comments: educated on back precautions Restrictions Weight Bearing Restrictions: No      Mobility Bed Mobility Overal bed mobility: Needs Assistance Bed Mobility: Sidelying to Sit;Sit to Sidelying   Sidelying to sit: Supervision     Sit to sidelying: Supervision General bed mobility comments: cues given.  Transfers Overall transfer level: Needs assistance   Transfers: Sit to/from Stand Sit to Stand: Min guard              Balance      Pt holding to IV pole for ambulation.                                       ADL Overall ADL's : Needs assistance/impaired Eating/Feeding: Sitting Eating/Feeding Details (indicate cue type and reason): pt twisting as she wanted her drink; pt independent with eating/feeding once placed in front of her.                 Lower Body Dressing: Min guard;Sit to/from stand   Toilet Transfer: Min guard;Ambulation (used IV pole to walk)           Functional mobility during ADLs: Min guard (pushed IV pole) General ADL Comments: Discussed incorporating precautions into functional activities such as LB ADL technique (crossing  legs over knees), use of cup for oral care, placement of grooming items, and not reaching further than above shoulder level. Recommended pt have someone with her for tub transfer-pt plans to sponge bathe for now. Pt not interested in shower chair.      Vision     Perception     Praxis      Pertinent Vitals/Pain Pain Assessment: 0-10 Pain Score:  (3-4) Pain Location: back Pain Intervention(s): Repositioned;Monitored during session     Hand Dominance Right   Extremity/Trunk Assessment Upper Extremity Assessment Upper Extremity Assessment: Overall WFL for tasks assessed   Lower Extremity Assessment Lower Extremity Assessment: Defer to PT evaluation       Communication Communication Communication: No difficulties   Cognition Arousal/Alertness: Awake/alert Behavior During Therapy: WFL for tasks assessed/performed Overall Cognitive Status: Within Functional Limits for tasks assessed                     General Comments       Exercises       Shoulder Instructions      Home Living Family/patient expects to be discharged to:: Private residence Living Arrangements: Alone Available Help at Discharge: Family (sons coming to stay with her until Christmas) Type of Home: House Home Access: Stairs to enter [employer] of Steps: 6 Entrance Stairs-Rails: Right;Left;Can reach both  Bathroom Shower/Tub: Teacher, early years/pre:  (sounds like it is an elevated toilet)                Prior Functioning/Environment Level of Independence: Independent             OT Diagnosis: Acute pain   OT Problem List:     OT Treatment/Interventions:      OT Goals(Current goals can be found in the care plan section)    OT Frequency:     Barriers to D/C:            Co-evaluation              End of Session Equipment Utilized During Treatment: Gait belt Nurse Communication: Other (comment) (asked nurse for basin and to give pt  call bell)  Activity Tolerance: Other (comment) (nauseous) Patient left: in bed;with nursing/sitter in room; SCDs placed on pt   Time: 1456-1516 OT Time Calculation (min): 20 min Charges:  OT General Charges $OT Visit: 1 Procedure OT Evaluation $Initial OT Evaluation Tier I: 1 Procedure G-CodesBenito Mccreedy OTR/L [id number] 02/05/2015, 3:26 PM

## 2015-02-05 NOTE — Anesthesia Procedure Notes (Signed)
Procedure Name: Intubation Date/Time: 02/05/2015 7:34 AM Performed by: Rebekah Chesterfield L Pre-anesthesia Checklist: Patient identified, Emergency Drugs available, Suction available, Patient being monitored and Timeout performed Patient Re-evaluated:Patient Re-evaluated prior to inductionOxygen Delivery Method: Circle system utilized Preoxygenation: Pre-oxygenation with 100% oxygen Intubation Type: IV induction Ventilation: Mask ventilation without difficulty Laryngoscope Size: Mac and 3 Grade View: Grade I Tube type: Oral Tube size: 7.5 mm Number of attempts: 1 Airway Equipment and Method: Stylet and LTA kit utilized Placement Confirmation: ETT inserted through vocal cords under direct vision,  breath sounds checked- equal and bilateral and positive ETCO2 Secured at: 20 cm Tube secured with: Tape Dental Injury: Teeth and Oropharynx as per pre-operative assessment

## 2015-02-05 NOTE — Discharge Summary (Signed)
Physician Discharge Summary  Patient ID: Debra Nguyen MRN: [id number] DOB/AGE: 1955-11-06 59 y.o.  Admit date: 02/05/2015 Discharge date: 02/05/2015  Admission Diagnoses: Synovial cyst of lumbar facet joint Right L 45 with right L 4 radiculopathy, spondylolisthesis, stenosis   Discharge Diagnoses: Synovial cyst of lumbar facet joint Right L 45 with right L 4 radiculopathy, spondylolisthesis, stenosis s/p Right Lumbar four-five Laminectomy with Resection of Synovial Cyst  (Right) with microdissection  Active Problems:   Synovial cyst of lumbar facet joint   Discharged Condition: good  Hospital Course: Darrick Meigs was admitted for surgery with dx synovial cyst with stenosis and radiculopathy.  Following uncomplicated laminectomy and cyst resection, she recovered well and transferred to [facility] for nursing care. She is progressing nicely.   Consults: None  Significant Diagnostic Studies: radiology: X-Ray: intra-op  Treatments: surgery: Right Lumbar four-five Laminectomy with Resection of Synovial Cyst  (Right) with microdissection   Discharge Exam: Blood pressure 143/84, pulse 84, temperature 97.2 F (36.2 C), temperature source Oral, resp. rate 18, last menstrual period 02/02/2011, SpO2 84 %. Alert, conversant. Reports no pain at present. Mild nausea when up to walk earlier, but responded well to Zofran. Strength is full BLE. Numbness RLE now gone. Incision flat, without erythema or drainage beneath honeycomb & Dermabond.  Pt verbalizes understanding of d/c instructions & states she has Percocet & Robaxin at home for prn use. She has f/u appt with DrStern.  She will mobilize again after resting.  Per Dr. Vertell Limber, she may d/c to home if feeling well after ambulating again.  Order entered.    Disposition: 01-Home or Self Care Pt verbalizes understanding of d/c instructions. She has Percocet & Robaxin at home for prn use.      Medication List    ASK your doctor about these  medications        CALTRATE 600+D 600-400 MG-UNIT tablet  Generic drug:  Calcium Carbonate-Vitamin D  Take 1 tablet by mouth daily.     estrogens (conjugated) 0.625 MG tablet  Commonly known as:  PREMARIN  Take one tablet 0.625 mg by mouth daily.     glucosamine-chondroitin 500-400 MG tablet  Take 1 tablet by mouth 3 (three) times daily.     hydrochlorothiazide 12.5 MG capsule  Commonly known as:  MICROZIDE  Take 12.5 mg by mouth daily.     ibuprofen 200 MG tablet  Commonly known as:  ADVIL,MOTRIN  Take 600 mg by mouth every 6 (six) hours as needed.     metoprolol succinate 25 MG 24 hr tablet  Commonly known as:  TOPROL-XL  Take 25 mg by mouth daily.     multivitamins ther. w/minerals Tabs tablet  Take 1 tablet by mouth daily.     TYLENOL 500 MG tablet  Generic drug:  acetaminophen  Take 1,000 mg by mouth once.           Follow-up Information    Follow up with Peggyann Shoals, MD.   Specialty:  Neurosurgery   Contact information:   [address] [place] [address] [address] [phone]       Signed: Verdis Prime 02/05/2015, 5:17 PM

## 2015-02-05 NOTE — Progress Notes (Signed)
Patient ID: Debra Nguyen, female   DOB: [birth year], 59 y.o.   MRN: [id number] Alert, conversant. Reports no pain at present. Mild nausea when up to walk earlier, but responded well to Zofran. Strength is full BLE. Numbness RLE now gone. Incision flat, without erythema or drainage beneath honeycomb & Dermabond.  Pt verbalizes understanding of d/c instructions & states she has Percocet & Robaxin at home for prn use. She has f/u appt with DrStern.  She will mobilize again after resting.  Per Dr. Vertell Limber, she may d/c to home if feeling well after ambulating again.  Order entered.   Verdis Prime RN BSN

## 2015-02-06 ENCOUNTER — Encounter (HOSPITAL_COMMUNITY): Payer: Self-pay | Admitting: Neurosurgery

## 2015-02-06 NOTE — Progress Notes (Signed)
Patient alert and oriented, mae's well, voiding adequate amount of urine, swallowing without difficulty, no c/o pain. Patient discharged home with family. Discharged instructions given to patient. Patient and family stated understanding of d/c instructions given and has an appointment with MD. 

## 2015-02-06 NOTE — Progress Notes (Signed)
PT Cancellation Note  Patient Details Name: Debra Nguyen MRN: [id number] DOB: 1955/03/08   Cancelled Treatment:    Reason Eval/Treat Not Completed: PT screened, no needs identified, will sign off.   PT met with pt this morning, and pt declined therapy services. States she had OT yesterday and does not feel she needs another therapy session. Pt does have multiple stairs at home and offered stair training, however pt declined. Reviewed back precautions and general safety at home. Will sign off at this time per pt request, however please reconsult if needs change.    Rolinda Roan 02/06/2015, 7:57 AM   Rolinda Roan, PT, DPT Acute Rehabilitation Services Pager: [phone]

## 2015-03-27 ENCOUNTER — Other Ambulatory Visit: Payer: Self-pay | Admitting: Obstetrics and Gynecology

## 2015-03-27 MED ORDER — ESTROGENS CONJUGATED 0.625 MG PO TABS: ORAL_TABLET | ORAL | Status: DC

## 2015-03-27 MED FILL — PREMARIN 0.625 MG TABLET: 0.625 | 90 days supply | Qty: 90 | Fill #0

## 2015-03-27 NOTE — Telephone Encounter (Signed)
Pt notified that rx was sent thru to the pharmacy.

## 2015-03-27 NOTE — Telephone Encounter (Signed)
Patient calling requesting refills on Premarin in three month increments be sent to her pharmacy on file to last until her next appointment.

## 2015-03-27 NOTE — Telephone Encounter (Signed)
Medication refill request: premarin 0.625mg  tablet Last AEX:  04-06-14 Next AEX: 04-17-15 Last MMG (if hormonal medication request): 12-24-14 category b density, birads 1:neg Hx of VAIN II Refill authorized: pt requesting 90 day supply refill to last to aex. Please advise.

## 2015-04-15 MED FILL — GABAPENTIN 100 MG CAPSULE: 100 | 10 days supply | Qty: 90 | Fill #0

## 2015-04-17 ENCOUNTER — Encounter: Payer: Self-pay | Admitting: Obstetrics and Gynecology

## 2015-04-17 ENCOUNTER — Ambulatory Visit (INDEPENDENT_AMBULATORY_CARE_PROVIDER_SITE_OTHER): Payer: 59 | Admitting: Obstetrics and Gynecology

## 2015-04-17 ENCOUNTER — Ambulatory Visit: Payer: 59 | Admitting: Obstetrics and Gynecology

## 2015-04-17 VITALS — BP 140/80 | HR 70 | Resp 18 | Ht 67.0 in | Wt 145.6 lb

## 2015-04-17 DIAGNOSIS — L989 Disorder of the skin and subcutaneous tissue, unspecified: Secondary | ICD-10-CM | POA: Diagnosis not present

## 2015-04-17 DIAGNOSIS — Z01419 Encounter for gynecological examination (general) (routine) without abnormal findings: Secondary | ICD-10-CM

## 2015-04-17 DIAGNOSIS — Z1272 Encounter for screening for malignant neoplasm of vagina: Secondary | ICD-10-CM | POA: Diagnosis not present

## 2015-04-17 DIAGNOSIS — R87622 Low grade squamous intraepithelial lesion on cytologic smear of vagina (LGSIL): Secondary | ICD-10-CM | POA: Diagnosis not present

## 2015-04-17 MED ORDER — ESTROGENS CONJUGATED 0.625 MG PO TABS: ORAL_TABLET | ORAL | Status: DC

## 2015-04-17 NOTE — Patient Instructions (Signed)

## 2015-04-17 NOTE — Progress Notes (Signed)
Patient ID: Debra Nguyen, female   DOB: 1955-08-08, 60 y.o.   MRN: [id number] 60 y.o. G14P2002 Divorced Caucasian female here for annual exam.    Patient is on oral Premarin and wants to continue.   History of abnormal pap as follows:   Colposcopy with biopy showed moderate to severe dysplasia 08/11/83. Had cryotherapy following this in [place], [place].  Pap 12/06/09 - LGSIL and positive HR HPV Pap 12/17/10 - CIN I. Status post hysterectomy 2013 for fibroids and focal CIN I w/clear margins. Pap 04/06/14 - showed LGSIL Colpo 05/14/14 - biopsy of left vaginal sidewall - VAIN II. Colposcopy with GYN ONC 07/27/14 - no high grade disease seen. No biopsies. Colposcopy will be done later this month with Dr. Denman George.   Patient has not had any ablative therapy for the vaginal dysplasia.   Having sores on her skin that are not healing. Hx sun exposure.  Had back surgery for a synovial cyst last year.  Back at work.  In nursing for 40 years.  PCP:   Kelton Pillar, MD  Patient's last menstrual period was 02/02/2011.          Sexually active: No. female The current method of family planning is post menopausal status/Hysterectomy 2013.    Exercising: No.  walking--had back surgery 01/2015. Smoker:  no  Health Maintenance: Pap:  11-14-14 LGSIL:Pos HR HPV;colpo bxs show both LGSIL & HGSIL (Dr.Rossi with Oncology following with colposcopy) History of abnormal Pap:  yes MMG:  12-26-14 3D/Density Cat.B/Neg/BiRads1:[facility]. Colonoscopy:  2007 normal with Dr. Ulyses Southward 2017. BMD:   10-18-13  Result  Osteopenia of hip/normal spine with Dr. Kelton Pillar. TDaP:  Up to date through [place]:  Hb today: PCP, Urine today: PCP   reports that she has never smoked. She has never used smokeless tobacco. She reports that she drinks about 6.0 - 7.2 oz of alcohol per week. She reports that she does not use illicit drugs.  Past Medical History  Diagnosis Date  . Fibroid 2006    3 cm  Right Fundal Fibroid  . ASCUS with positive high risk HPV 12/2006    Negative Colpo BX  . ASCUS with positive high risk HPV 2009  . CIN III (cervical intraepithelial neoplasia III) 07/1983    tx'd w/cryo- no subsequent abnormal paps until 2008  . Hypertension   . GERD (gastroesophageal reflux disease) 2011  . Anxiety   . Essential hypertension   . Vitamin D deficiency   . Synovial cyst of lumbar spine 10/2014  . History of kidney stones   . Arthritis     Past Surgical History  Procedure Laterality Date  . Vaginal delivery  '92 '94  . Lasik  '99  . Cystoscopy  02/23/2011    PT.STATES SHE DID NOT HAVE  . Anterior cervical decomp/discectomy fusion N/A 07/19/2012    Procedure: ANTERIOR CERVICAL DECOMPRESSION/DISCECTOMY FUSION 2 LEVELS;  Surgeon: Erline Levine, MD;  Location: [facility];  Service: Neurosurgery;  Laterality: N/A;  Anterior Cervical Five-Six Cervical Six-Seven Anterior Cervical Decompression/Diskectomy/Fusion  . Abdominal hysterectomy  02/23/11    R TLH-fibroids, adenomyosis 221 g, & focal CIN I w/clear margins  . Tonsillectomy and adenoidectomy    . Lumbar laminectomy/decompression microdiscectomy Right 02/05/2015    Procedure: Right Lumbar four-five Laminectomy with Resection of Synovial Cyst ;  Surgeon: Erline Levine, MD;  Location: [facility];  Service: Neurosurgery;  Laterality: Right;    Current Outpatient Prescriptions  Medication Sig Dispense  Refill  . Calcium Carbonate-Vitamin D (CALTRATE 600+D) 600-400 MG-UNIT per tablet Take 1 tablet by mouth daily.      Marland Kitchen estrogens, conjugated, (PREMARIN) 0.625 MG tablet Take one tablet 0.625 mg by mouth daily. 90 tablet 0  . glucosamine-chondroitin 500-400 MG tablet Take 1 tablet by mouth. Takes 1-2 times per week.    . hydrochlorothiazide (MICROZIDE) 12.5 MG capsule Take 12.5 mg by mouth daily.     Marland Kitchen ibuprofen (ADVIL,MOTRIN) 200 MG tablet Take 600 mg by mouth every 6 (six) hours as needed.    . metoprolol succinate (TOPROL-XL)  25 MG 24 hr tablet Take 25 mg by mouth daily.    . Multiple Vitamins-Minerals (MULTIVITAMINS THER. W/MINERALS) TABS Take 1 tablet by mouth daily.       No current facility-administered medications for this visit.    Family History  Problem Relation Age of Onset  . Hypertension Mother   . Heart disease Mother   . Cancer Mother     lung  . Hypertension Father   . Heart attack Father     ROS:  Pertinent items are noted in HPI.  Otherwise, a comprehensive ROS was negative.  Exam:   BP 140/80 mmHg  Pulse 70  Resp 18  Ht 5\' 7"  (1.702 m)  Wt 145 lb 9.6 oz (66.044 kg)  BMI 22.80 kg/m2  LMP 02/02/2011    General appearance: alert, cooperative and appears stated age Head: Normocephalic, without obvious abnormality, atraumatic Neck: no adenopathy, supple, symmetrical, trachea midline and thyroid normal to inspection and palpation Lungs: clear to auscultation bilaterally Breasts: normal appearance, no masses or tenderness, Inspection negative, No nipple retraction or dimpling, No nipple discharge or bleeding, No axillary or supraclavicular adenopathy Heart: regular rate and rhythm Abdomen: soft, non-tender; bowel sounds normal; no masses,  no organomegaly Extremities: extremities normal, atraumatic, no cyanosis or edema Skin: Skin color, texture, turgor normal.  Scaling excoriated skin lesions of arms and midline chest.   Lymph nodes: Cervical, supraclavicular, and axillary nodes normal. No abnormal inguinal nodes palpated Neurologic: Grossly normal  Pelvic: External genitalia:  no lesions              Urethra:  normal appearing urethra with no masses, tenderness or lesions              Bartholins and Skenes: normal                 Vagina: normal appearing vagina with normal color and discharge, no lesions              Cervix: absent              Pap taken: Yes.   Bimanual Exam:  Uterus:  uterus absent              Adnexa: normal adnexa and no mass, fullness, tenderness               Rectovaginal: Yes.  .  Confirms.              Anus:  normal sphincter tone, no lesions  Chaperone was present for exam.  Assessment:   Well woman visit with normal exam. Status post hysterectomy in 2013.  Hx of prior CIN III. Hx of VAIN II.  Observational management.  ERT patient.  Hx of osteopenia.  Skin lesions. Hx of sun exposure.   Plan: Yearly mammogram recommended after age 59.  Recommended self breast exam.  Pap and HR HPV as above. Discussed Calcium,  Vitamin D, regular exercise program including cardiovascular and weight bearing exercise. Labs performed.  No..   See orders. Refills given on medications.  Yes.  .  See orders.  Premarin 0.3 mg.  We discussed WHI and risks of DVT, PE, and stroke.  We reviewed how estrogens can stimulate estrogen receptor cancers of the breast but that the WHI study did not confirm increased risk of breast cancer for Premarin users.  Patient will see Dr. Denman George for a colposcopy at the end of this month.  Bone density end of this year or next year - PCP managing.  Colonoscopy recommended.  Patient will contact Dr. Olevia Perches.  Referral to Dr. Renda Rolls - dermatology.  Follow up annually and prn.      After visit summary provided.

## 2015-04-24 LAB — IPS PAP TEST WITH HPV

## 2015-04-29 ENCOUNTER — Telehealth: Payer: Self-pay | Admitting: Emergency Medicine

## 2015-04-29 NOTE — Telephone Encounter (Deleted)
-----   Message from Nunzio Cobbs, MD sent at 04/26/2015  5:18 PM EST ----- Please communicate test results to patient. Great that she has the appointment with Dr. Denman George.  Have her keep this appointment.   Debra Nguyen ----- Message -----    From: Michele Mcalpine, RN    Sent: 04/26/2015  12:44 PM      To: Nunzio Cobbs, MD  Dr. Quincy Simmonds,  Patient is scheduled for colposcopy with Dr. Denman George on 05/20/15. Okay to keep as scheduled and give patient pap results?

## 2015-04-29 NOTE — Telephone Encounter (Signed)
Notes Recorded by Nunzio Cobbs, MD on 04/24/2015 at 12:49 PM Pap is showing both LGSIL and HGSIL.  It is time for colposcopy again.  Please schedule this with me.  Cc- Marisa Sprinkles

## 2015-04-29 NOTE — Telephone Encounter (Signed)
-----   Message from Nunzio Cobbs, MD sent at 04/26/2015  5:18 PM EST ----- Please communicate test results to patient. Great that she has the appointment with Dr. Denman George.  Have her keep this appointment.   Josefa Half ----- Message -----    From: Michele Mcalpine, RN    Sent: 04/26/2015  12:44 PM      To: Nunzio Cobbs, MD  Dr. Quincy Simmonds,  Patient is scheduled for colposcopy with Dr. Denman George on 05/20/15. Okay to keep as scheduled and give patient pap results?

## 2015-04-29 NOTE — Telephone Encounter (Signed)
Message left to return call to Georgina Krist at [phone].    

## 2015-04-29 NOTE — Telephone Encounter (Signed)
Patient returned call and she is given results of pap smear and HR HPV testing. Patient verbalizes understanding of results. She has appointment for colposcopy with Dr. Denman George on 05/20/15.  Call to Dr. Serita Grit office and spoke with Maudie Mercury, she is able to pull up the results of recent pap smear for Dr. Denman George.   Routing to provider for final review. Patient agreeable to disposition. Will close encounter.

## 2015-04-29 NOTE — Telephone Encounter (Deleted)
-----   Message from Nunzio Cobbs, MD sent at 04/26/2015  5:51 PM EST ----- Please give patient results of pap.  Great that patient has an appointment with Dr. Denman George.  I would place in recall for September 2017.   Cc- Marisa Sprinkles

## 2015-05-01 ENCOUNTER — Ambulatory Visit: Payer: 59 | Admitting: Gynecologic Oncology

## 2015-05-16 MED FILL — METOPROLOL SUCC ER 25 MG TA: 25 | 90 days supply | Qty: 90 | Fill #2

## 2015-05-16 MED FILL — HYDROCHLOROTHIAZIDE 12.5 MG: 12.5 | 90 days supply | Qty: 90 | Fill #2

## 2015-05-20 ENCOUNTER — Ambulatory Visit: Payer: 59 | Attending: Gynecologic Oncology | Admitting: Gynecologic Oncology

## 2015-05-20 ENCOUNTER — Encounter: Payer: Self-pay | Admitting: Gynecologic Oncology

## 2015-05-20 VITALS — BP 139/71 | HR 60 | Temp 98.2°F | Resp 18 | Ht 67.0 in | Wt 144.0 lb

## 2015-05-20 DIAGNOSIS — K219 Gastro-esophageal reflux disease without esophagitis: Secondary | ICD-10-CM | POA: Diagnosis not present

## 2015-05-20 DIAGNOSIS — I10 Essential (primary) hypertension: Secondary | ICD-10-CM | POA: Insufficient documentation

## 2015-05-20 DIAGNOSIS — E559 Vitamin D deficiency, unspecified: Secondary | ICD-10-CM | POA: Diagnosis not present

## 2015-05-20 DIAGNOSIS — Z8541 Personal history of malignant neoplasm of cervix uteri: Secondary | ICD-10-CM | POA: Diagnosis not present

## 2015-05-20 DIAGNOSIS — N891 Moderate vaginal dysplasia: Secondary | ICD-10-CM | POA: Diagnosis not present

## 2015-05-20 DIAGNOSIS — Z87442 Personal history of urinary calculi: Secondary | ICD-10-CM | POA: Diagnosis not present

## 2015-05-20 DIAGNOSIS — B977 Papillomavirus as the cause of diseases classified elsewhere: Secondary | ICD-10-CM | POA: Diagnosis not present

## 2015-05-20 DIAGNOSIS — Z90710 Acquired absence of both cervix and uterus: Secondary | ICD-10-CM | POA: Diagnosis not present

## 2015-05-20 DIAGNOSIS — M7138 Other bursal cyst, other site: Secondary | ICD-10-CM | POA: Insufficient documentation

## 2015-05-20 DIAGNOSIS — R87628 Other abnormal cytological findings on specimens from vagina: Secondary | ICD-10-CM | POA: Diagnosis not present

## 2015-05-20 DIAGNOSIS — M1990 Unspecified osteoarthritis, unspecified site: Secondary | ICD-10-CM | POA: Insufficient documentation

## 2015-05-20 NOTE — Progress Notes (Signed)
Follow-up Note: Gyn-Onc  Consult was initially requested by Dr. Quincy Simmonds for the evaluation of Debra Nguyen 60 y.o. 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 VAIN II follow-up    Assessment/Plan:  Debra Nguyen  is a 60 y.o.  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 Recent pap with LGSIL. Low grade changes on today' colpo.  Will follow-up the results of today's biopsy. If VAIN I or II we will plan for close surveillance with repeat colpo and pap in 6 months.  HPI: The patient is a very pleasant 60 year old gravida 2 para 2 who is seen in consultation at the request of Dr. Quincy Simmonds for persistent high risk HPV and moderate vaginal dysplasia. Patient has a 35 year history of initially cervical, and then vaginal dysplasia. She's had multiple excisional or ablative procedures throughout the years including cryotherapy remotely and a hysterectomy performed in 2013 for symptomatic uterine fibroids in the setting of low-grade cervical dysplasia. She developed vaginal dysplasia post-hysterectomy with a Pap smear in February 2016 showing low-grade squamous intraepithelial lesion and positive for high-risk HPV, and colposcopy in March 2016 revealed moderate grade dysplasia in the left vaginal vault in 2 very small foci one of which was biopsied to remove it and demonstrated VAIN II.  The patient has no concerning symptoms including no bleeding or discharge from the vagina, no lower extremity edema, no back pain, no dyspareunia. She had a pap in September 2016 which showed LSIL with positive hrHPV. She also had colpo in September 2016 with Dr Quincy Simmonds which showed persistent area at the left vaginal cuff that appeared abnormal and was biopsied at 2 sites. One site showed VAIN I and the other showed VAIN II. Dr Quincy Simmonds had recommended laser.  Interval History:  The patient had a pap smear on 04/17/15 with Dr Quincy Simmonds which showed LGSIL,  with hr HPV detected.  Current Meds:  Outpatient Encounter Prescriptions as of 05/20/2015  Medication Sig  . Calcium Carbonate-Vitamin D (CALTRATE 600+D) 600-400 MG-UNIT per tablet Take 1 tablet by mouth daily.    Marland Kitchen estrogens, conjugated, (PREMARIN) 0.625 MG tablet Take one tablet 0.625 mg by mouth daily.  Marland Kitchen gabapentin (NEURONTIN) 100 MG capsule Take 100 mg by mouth at bedtime.  Marland Kitchen glucosamine-chondroitin 500-400 MG tablet Take 1 tablet by mouth. Takes 1-2 times per week.  . hydrochlorothiazide (MICROZIDE) 12.5 MG capsule Take 12.5 mg by mouth daily.   Marland Kitchen ibuprofen (ADVIL,MOTRIN) 200 MG tablet Take 600 mg by mouth every 6 (six) hours as needed.  . metoprolol succinate (TOPROL-XL) 25 MG 24 hr tablet Take 25 mg by mouth daily.  . Multiple Vitamins-Minerals (MULTIVITAMINS THER. W/MINERALS) TABS Take 1 tablet by mouth daily.     No facility-administered encounter medications on file as of 05/20/2015.    Allergy:  Allergies  Allergen Reactions  . Codeine Itching  . Ultram [Tramadol] Itching    Social Hx:   Social History   Social History  . Marital Status: Divorced    Spouse Name: N/A  . Number of Children: N/A  . Years of Education: N/A   Occupational History  . Not on file.   Social History Main Topics  . Smoking status: Never Smoker   . Smokeless tobacco: Never Used  . Alcohol Use: 6.0 - 7.2 oz/week    10-12 Glasses of wine per week     Comment: 10-12 glasses of wine a week  .  Drug Use: No  . Sexual Activity: No     Comment: R-TLH   Other Topics Concern  . Not on file   Social History Narrative    Past Surgical Hx:  Past Surgical History  Procedure Laterality Date  . Vaginal delivery  '92 '94  . Lasik  '99  . Cystoscopy  02/23/2011    PT.STATES SHE DID NOT HAVE  . Anterior cervical decomp/discectomy fusion N/A 07/19/2012    Procedure: ANTERIOR CERVICAL DECOMPRESSION/DISCECTOMY FUSION 2 LEVELS;  Surgeon: Erline Levine, MD;  Location: [facility];  Service: Neurosurgery;   Laterality: N/A;  Anterior Cervical Five-Six Cervical Six-Seven Anterior Cervical Decompression/Diskectomy/Fusion  . Abdominal hysterectomy  02/23/11    R TLH-fibroids, adenomyosis 221 g, & focal CIN I w/clear margins  . Tonsillectomy and adenoidectomy    . Lumbar laminectomy/decompression microdiscectomy Right 02/05/2015    Procedure: Right Lumbar four-five Laminectomy with Resection of Synovial Cyst ;  Surgeon: Erline Levine, MD;  Location: [facility];  Service: Neurosurgery;  Laterality: Right;    Past Medical Hx:  Past Medical History  Diagnosis Date  . Fibroid 2006    3 cm Right Fundal Fibroid  . ASCUS with positive high risk HPV 12/2006    Negative Colpo BX  . ASCUS with positive high risk HPV 2009  . CIN III (cervical intraepithelial neoplasia III) 07/1983    tx'd w/cryo- no subsequent abnormal paps until 2008  . Hypertension   . GERD (gastroesophageal reflux disease) 2011  . Anxiety   . Essential hypertension   . Vitamin D deficiency   . Synovial cyst of lumbar spine 10/2014  . History of kidney stones   . Arthritis     Past Gynecological History:  SVD x 2  Patient's last menstrual period was 02/02/2011.  Family Hx:  Family History  Problem Relation Age of Onset  . Hypertension Mother   . Heart disease Mother   . Cancer Mother     lung  . Hypertension Father   . Heart attack Father     Review of Systems:  Constitutional  Feels well,    ENT Normal appearing ears and nares bilaterally Skin/Breast  No rash, sores, jaundice, itching, dryness Cardiovascular  No chest pain, shortness of breath, or edema  Pulmonary  No cough or wheeze.  Gastro Intestinal  No nausea, vomitting, or diarrhoea. No bright red blood per rectum, no abdominal pain, change in bowel movement, or constipation.  Genito Urinary  No frequency, urgency, dysuria, see above Musculo Skeletal  No myalgia, arthralgia, joint swelling or pain  Neurologic  No weakness, numbness, change in gait,   Psychology  No depression, anxiety, insomnia.   Vitals:  Blood pressure 139/71, pulse 60, temperature 98.2 F (36.8 C), temperature source Oral, resp. rate 18, height 5\' 7"  (1.702 m), weight 144 lb (65.318 kg), last menstrual period 02/02/2011, SpO2 100 %.  Physical Exam: Lymph node survey: no suspicious inguinal nodes Abdomen: soft, nondistended.  Pelvic: colpscopic evaluation performed of entire vagina with 4% acetic acid. Area of dysplasia measuring approximately 1cm and left vaginal cuff with abnormal vasculature and acetowhite changes - entirely biopsied with forceps. Hemostasis obtained with silver nitrate.  Donaciano Eva, MD   05/20/2015, 1:58 PM

## 2015-05-20 NOTE — Patient Instructions (Signed)
We will call you with the results of your biopsy from today.  If no intervention necessary, plan to follow up in six months or sooner if needed.  If you please call our office in one to two weeks, we will have our schedule for 6 months.

## 2015-05-22 ENCOUNTER — Telehealth: Payer: Self-pay | Admitting: Gynecologic Oncology

## 2015-05-22 NOTE — Telephone Encounter (Signed)
Left message for patient that her (biopsy revealed HPV changes but no dysplasia. This is good. We can repeat pap and colpo in 6 months)-per Dr. Denman George.

## 2015-05-28 NOTE — Progress Notes (Signed)
Please place in pap/colpo recall for October 2017.  I believe she will see Dr. Denman George, but I would like to keep her in our recall as well.  She is being followed for VAIN II.

## 2015-06-17 MED FILL — PREMARIN 0.625 MG TABLET: 0.625 | 90 days supply | Qty: 90 | Fill #0

## 2015-06-17 MED FILL — GABAPENTIN 100 MG CAPSULE: 100 | 10 days supply | Qty: 90 | Fill #1

## 2015-07-03 ENCOUNTER — Encounter: Payer: Self-pay | Admitting: Gastroenterology

## 2015-08-05 MED FILL — HYDROCHLOROTHIAZIDE 12.5 MG: 12.5 | 90 days supply | Qty: 90 | Fill #3

## 2015-08-05 MED FILL — METOPROLOL SUCC ER 25 MG TA: 25 | 90 days supply | Qty: 90 | Fill #3

## 2015-08-05 MED FILL — GABAPENTIN 100 MG CAPSULE: 100 | 10 days supply | Qty: 90 | Fill #2

## 2015-08-21 ENCOUNTER — Telehealth: Payer: Self-pay | Admitting: Obstetrics and Gynecology

## 2015-08-21 DIAGNOSIS — N891 Moderate vaginal dysplasia: Secondary | ICD-10-CM

## 2015-08-21 NOTE — Telephone Encounter (Signed)
I will be happy to do the colposcopy in October 2017 for hx VAIN II. Please sent for precert and schedule.  Cc- Marisa Sprinkles

## 2015-08-21 NOTE — Telephone Encounter (Signed)
Return call to patient. Would like to schedule 6 month follow-up pap and colpo here in office with Dr Quincy Simmonds. Dr Denman George and Dr Quincy Simmonds have been consulting together to follow her with this same area.  Cost is significanty higher to have same procedure done at [facility].  Patient would like to have procedure here and will return to Dr Denman George if need arises. Advised will schedule appointment but will need to confirm approval with Dr Quincy Simmonds and call her back. Appointment scheduled for 12-04-15.   Please advise.

## 2015-08-21 NOTE — Telephone Encounter (Signed)
Call to patient. Advised Dr Quincy Simmonds agreeable to appointment as scheduled. Encounter closed.

## 2015-08-21 NOTE — Telephone Encounter (Signed)
Patient  wants to schedule a follow up Colpo for [employer]

## 2015-08-27 ENCOUNTER — Telehealth: Payer: Self-pay | Admitting: Gynecologic Oncology

## 2015-08-27 NOTE — Telephone Encounter (Signed)
Patient called stating to see Dr. Denman George in the office is too costly for her and she can see Dr Quincy Simmonds in the office for a significantly less amount.  Stating she would like to cancel her appt with Denman George in October and she will see Dr. Quincy Simmonds and have Dr. Quincy Simmonds send the results from her colpo and possible biopsies.  Advised to call for any needs or concerns and advised we would have Dr. Denman George review her results from [facility].

## 2015-09-27 MED FILL — PREMARIN 0.625 MG TABLET: 0.625 | 90 days supply | Qty: 90 | Fill #1

## 2015-10-02 DIAGNOSIS — M545 Low back pain: Secondary | ICD-10-CM | POA: Diagnosis not present

## 2015-10-02 DIAGNOSIS — I10 Essential (primary) hypertension: Secondary | ICD-10-CM | POA: Diagnosis not present

## 2015-10-02 DIAGNOSIS — E7800 Pure hypercholesterolemia, unspecified: Secondary | ICD-10-CM | POA: Diagnosis not present

## 2015-10-02 DIAGNOSIS — Z0000 Encounter for general adult medical examination without abnormal findings: Secondary | ICD-10-CM | POA: Diagnosis not present

## 2015-10-23 DIAGNOSIS — L814 Other melanin hyperpigmentation: Secondary | ICD-10-CM | POA: Diagnosis not present

## 2015-10-23 DIAGNOSIS — D1801 Hemangioma of skin and subcutaneous tissue: Secondary | ICD-10-CM | POA: Diagnosis not present

## 2015-10-23 DIAGNOSIS — B079 Viral wart, unspecified: Secondary | ICD-10-CM | POA: Diagnosis not present

## 2015-10-23 DIAGNOSIS — L570 Actinic keratosis: Secondary | ICD-10-CM | POA: Diagnosis not present

## 2015-10-23 DIAGNOSIS — D225 Melanocytic nevi of trunk: Secondary | ICD-10-CM | POA: Diagnosis not present

## 2015-10-23 DIAGNOSIS — L821 Other seborrheic keratosis: Secondary | ICD-10-CM | POA: Diagnosis not present

## 2015-10-23 MED FILL — FLUOROURACIL 5% CREAM: 5 | 30 days supply | Qty: 40 | Fill #0

## 2015-11-11 MED FILL — GABAPENTIN 100 MG CAPSULE: 100 | 10 days supply | Qty: 90 | Fill #0

## 2015-11-11 MED FILL — METOPROLOL SUCC ER 25 MG TA: 25 | 90 days supply | Qty: 90 | Fill #0

## 2015-11-11 MED FILL — HYDROCHLOROTHIAZIDE 12.5 MG: 12.5 | 90 days supply | Qty: 90 | Fill #0

## 2015-12-03 ENCOUNTER — Other Ambulatory Visit: Payer: Self-pay | Admitting: Obstetrics and Gynecology

## 2015-12-03 DIAGNOSIS — Z1231 Encounter for screening mammogram for malignant neoplasm of breast: Secondary | ICD-10-CM

## 2015-12-04 ENCOUNTER — Ambulatory Visit (INDEPENDENT_AMBULATORY_CARE_PROVIDER_SITE_OTHER): Payer: 59 | Admitting: Obstetrics and Gynecology

## 2015-12-04 ENCOUNTER — Ambulatory Visit: Payer: 59 | Admitting: Gynecologic Oncology

## 2015-12-04 ENCOUNTER — Encounter: Payer: Self-pay | Admitting: Obstetrics and Gynecology

## 2015-12-04 DIAGNOSIS — N891 Moderate vaginal dysplasia: Secondary | ICD-10-CM

## 2015-12-04 NOTE — Progress Notes (Signed)
Subjective:     Patient ID: Debra Nguyen, female   DOB: 12-08-1955, 60 y.o.   MRN: [id number]  HPI  Patient here today for colposcopy.   Pap 04-17-15 LGSIL and HGSIL:Pos HR HPV--colposcopy 05-20-15 with Dr. Denman George revealed HPV changes but no dysplasia--repeat colposcopy and pap in 6 months.  History is as follows: Colposcopy with biopy showed moderate to severe dysplasia 08/11/83. Had cryotherapy following this in [place], [place].  Pap 12/06/09 - LGSIL and positive HR HPV Pap 12/17/10 - CIN I. Status post hysterectomy 2013 for fibroids and focal CIN I w/clear margins. Pap 04/06/14 - showed LGSIL Colpo 05/14/14 - biopsy of left vaginal sidewall - VAIN II. Colposcopy with GYN ONC 07/27/14 - no high grade disease seen. No biopsies.   Review of Systems  RN:[id number] Contraception:Hysterectomy     Objective:   Physical Exam  Genitourinary:     Colposcopy Consent for procedure.  Speculum placed.  Pap and HR HPV done. 3% acetic acid used. 2 raised verrucous areas of the left vaginal apex noted.  Vagina stained with Lugol's. Decreased uptake in left vaginal apex and small circular 2 -3 mm areas of the right apex. Attempted biopsy of the right apex not successful - unable to obtain tissue and painful for patient, so I stopped in this area.  Successful biopsy of the left vaginal apex -first anterior and then posterior biopsy.  Sent to pathology separately.  Monsel's applied.  Minimal EBL.  No complications.     Assessment:     Hx cryo for severe cervical dysplasia. Hysterectomy for fibroids and dysplasia.  Final pathology - CIN I.  Hx VAIN II. Has had oncology consultation.     Plan:     We discussed tx options for vaginal dysplasia - Effudex, laser ablation of the dysplasia (or could be sharp excision for specific lesions.  Follow up pap and bx results. All to [facility]. She knows that tx will not eradicate the virus.  __15_____ minutes face to face time of which over 50%  was spent in counseling.   After visit summary to patient.

## 2015-12-04 NOTE — Patient Instructions (Signed)

## 2015-12-05 ENCOUNTER — Encounter: Payer: Self-pay | Admitting: Obstetrics and Gynecology

## 2015-12-05 ENCOUNTER — Other Ambulatory Visit: Payer: Self-pay | Admitting: Obstetrics and Gynecology

## 2015-12-05 DIAGNOSIS — N890 Mild vaginal dysplasia: Secondary | ICD-10-CM | POA: Diagnosis not present

## 2015-12-05 DIAGNOSIS — R87612 Low grade squamous intraepithelial lesion on cytologic smear of cervix (LGSIL): Secondary | ICD-10-CM | POA: Diagnosis not present

## 2015-12-05 DIAGNOSIS — N891 Moderate vaginal dysplasia: Secondary | ICD-10-CM | POA: Diagnosis not present

## 2015-12-12 ENCOUNTER — Telehealth: Payer: Self-pay | Admitting: *Deleted

## 2015-12-12 NOTE — Telephone Encounter (Signed)
-----   Message from Nunzio Cobbs, MD sent at 12/10/2015  6:48 PM EDT ----- Please contact patient with results of her pap and colposcopic biopsies.  The pap showed LGSIL and possible HGSIL. The biopsies showed LGSIL and HGSIL.  No cancer was seen.  She has options for continued pap and colpo every 6 months, Effudex in vagina, and surgical excision and laser of lesions.  We discussed these choices briefly at her colpo visit. I am happy to see her in the office if she would like to review these options further.  Please place her in recall for April 2018.   Cc- Marisa Sprinkles

## 2015-12-12 NOTE — Telephone Encounter (Signed)
Left message to call Sharee Pimple at [phone].  06 recall placed.

## 2015-12-13 NOTE — Telephone Encounter (Signed)
Spoke with patient. Advised as seen below per Dr. Elza Rafter request. Patient verbalizes understanding and is agreeable. Patient would like to take time to think about options and return call to set up OV if needed.  Routing to provider for final review. Patient is agreeable to disposition. Will close encounter.

## 2015-12-30 MED FILL — PREMARIN 0.625 MG TABLET: 0.625 | 90 days supply | Qty: 90 | Fill #2

## 2016-01-08 ENCOUNTER — Ambulatory Visit
Admission: RE | Admit: 2016-01-08 | Discharge: 2016-01-08 | Disposition: A | Discharge status: Home / Self Care | Payer: 59 | Source: Ambulatory Visit | Attending: Obstetrics and Gynecology | Admitting: Obstetrics and Gynecology

## 2016-01-08 DIAGNOSIS — Z1231 Encounter for screening mammogram for malignant neoplasm of breast: Secondary | ICD-10-CM | POA: Diagnosis not present

## 2016-01-17 MED FILL — METHYLPREDNISOLONE 4 MG TAB: 4 | 6 days supply | Qty: 21 | Fill #0

## 2016-02-12 MED FILL — HYDROCHLOROTHIAZIDE 12.5 MG: 12.5 | 90 days supply | Qty: 90 | Fill #1

## 2016-02-12 MED FILL — METOPROLOL SUCC ER 25 MG TA: 25 | 90 days supply | Qty: 90 | Fill #1

## 2016-03-30 MED FILL — PREMARIN 0.625 MG TABLET: 0.625 | 90 days supply | Qty: 90 | Fill #3

## 2016-04-27 NOTE — Progress Notes (Signed)
61 y.o. G80P2002 Divorced Caucasian female here for annual exam.    Taking oral Premarin 0.625 mg.   Had colposcopy in Oct 2017 and diagnosed with VAIN II.  Here for 6 month follow up.   No vaginal bleeding or discharge.   Goes to the [employer].   PCP:  Kelton Pillar, MD    Patient's last menstrual period was 02/02/2011.           Sexually active: No. female The current method of family planning is post menopausal status/Hysterectomy 2013.    Exercising: Yes.    Deep water therapy at [facility] Smoker:  no  Health Maintenance: Pap: 04-17-15 LGSIL & HGSIL--colpo with Dr.Rossi revealed HPV changes but no dysplasia. History of abnormal Pap:  Yes, Colposcopy with biopy showed moderate to severe dysplasia 08/11/83. Had cryotherapy following this in [place], [place].  Pap 12/06/09 - LGSIL and positive HR HPV Pap 12/17/10 - CIN I. Status post hysterectomy 2013 for fibroids and focal CIN I w/clear margins. Pap 04/06/14 - showed LGSIL Colpo 05/14/14 - biopsy of left vaginal sidewall - VAIN II. Colposcopy with GYN ONC 07/27/14 - no high grade disease seen. No biopsies. Colposcopy 05-20-15 revealed HPV changes but no dysplasia.  Pap 12-05-15 showed LGSIL and possible HGSIL. Biopsies showed LGSIL and HGSIL. Repeat colpo and pap every 6 months. MMG:  01-08-16 Density B/Neg/biRads1/Neg:TBC Colonoscopy: 2007 normal with Dr. Ulyses Southward 2017.  BMD:   10-18-13  Result: Osteopenia of hip/normal spine with Dr. Carmelina Dane.   TDaP: Up to date through [facility] Gardasil:   N/A HIV:  Donated blood 1.5 years ago. Hep C:  Donated blood 1.5 years ago.  Screening Labs:  Hb today: PCP, Urine today: not done   reports that she has never smoked. She has never used smokeless tobacco. She reports that she drinks about 6.0 - 7.2 oz of alcohol per week . She reports that she does not use drugs.  Past Medical History:  Diagnosis Date  . Anxiety   . Arthritis   . ASCUS with positive high risk HPV 12/2006   Negative Colpo BX   . ASCUS with positive high risk HPV 2009  . CIN III (cervical intraepithelial neoplasia III) 07/1983   tx'd w/cryo- no subsequent abnormal paps until 2008  . Essential hypertension   . Fibroid 2006   3 cm Right Fundal Fibroid  . GERD (gastroesophageal reflux disease) 2011  . History of kidney stones   . Hypertension   . Synovial cyst of lumbar spine 10/2014  . Vitamin D deficiency     Past Surgical History:  Procedure Laterality Date  . ABDOMINAL HYSTERECTOMY  02/23/11   R TLH-fibroids, adenomyosis 221 g, & focal CIN I w/clear margins  . ANTERIOR CERVICAL DECOMP/DISCECTOMY FUSION N/A 07/19/2012   Procedure: ANTERIOR CERVICAL DECOMPRESSION/DISCECTOMY FUSION 2 LEVELS;  Surgeon: Erline Levine, MD;  Location: [place] [facility];  Service: Neurosurgery;  Laterality: N/A;  Anterior Cervical Five-Six Cervical Six-Seven Anterior Cervical Decompression/Diskectomy/Fusion  . CYSTOSCOPY  02/23/2011   PT.STATES SHE DID NOT HAVE  . LASIK  '99  . LUMBAR LAMINECTOMY/DECOMPRESSION MICRODISCECTOMY Right 02/05/2015   Procedure: Right Lumbar four-five Laminectomy with Resection of Synovial Cyst ;  Surgeon: Erline Levine, MD;  Location: [place] [facility]S;  Service: Neurosurgery;  Laterality: Right;  . TONSILLECTOMY AND ADENOIDECTOMY    . VAGINAL DELIVERY  '92 '94    Current Outpatient Prescriptions  Medication Sig Dispense Refill  . Calcium Carbonate-Vitamin D (CALTRATE 600+D) 600-400 MG-UNIT per tablet Take 1  tablet by mouth daily.      Marland Kitchen estrogens, conjugated, (PREMARIN) 0.625 MG tablet Take one tablet 0.625 mg by mouth daily. 90 tablet 3  . hydrochlorothiazide (MICROZIDE) 12.5 MG capsule Take 12.5 mg by mouth daily.     Marland Kitchen ibuprofen (ADVIL,MOTRIN) 200 MG tablet Take 600 mg by mouth every 6 (six) hours as needed.    . metoprolol succinate (TOPROL-XL) 25 MG 24 hr tablet Take 25 mg by mouth daily.    . Multiple Vitamins-Minerals (MULTIVITAMINS THER. W/MINERALS) TABS Take 1 tablet by mouth daily.       No current  facility-administered medications for this visit.     Family History  Problem Relation Age of Onset  . Hypertension Mother   . Heart disease Mother   . Cancer Mother     lung  . Hypertension Father   . Heart attack Father     ROS:  Pertinent items are noted in HPI.  Otherwise, a comprehensive ROS was negative.  Exam:   BP 122/76 (BP Location: Right Arm, Patient Position: Sitting, Cuff Size: Normal)   Pulse 70   Resp 14   Ht 5\' 7"  (1.702 m)   Wt 140 lb 3.2 oz (63.6 kg)   LMP 02/02/2011   BMI 21.96 kg/m     General appearance: alert, cooperative and appears stated age Head: Normocephalic, without obvious abnormality, atraumatic Neck: no adenopathy, supple, symmetrical, trachea midline and thyroid normal to inspection and palpation Lungs: clear to auscultation bilaterally Breasts: normal appearance, no masses or tenderness, No nipple retraction or dimpling, No nipple discharge or bleeding, No axillary or supraclavicular adenopathy Heart: regular rate and rhythm Abdomen: soft, non-tender; no masses, no organomegaly Extremities: extremities normal, atraumatic, no cyanosis or edema Skin: Skin color, texture, turgor normal. No rashes or lesions Lymph nodes: Cervical, supraclavicular, and axillary nodes normal. No abnormal inguinal nodes palpated Neurologic: Grossly normal  Pelvic: External genitalia:  no lesions              Urethra:  normal appearing urethra with no masses, tenderness or lesions              Bartholins and Skenes: normal                 Vagina: normal appearing vagina with normal color and discharge, no lesions              Cervix: absent.               Pap taken: Yes.   Bimanual Exam:  Uterus:   Absent.               Adnexa: no mass, fullness, tenderness              Rectal exam: Yes.  .  Confirms.              Anus:  normal sphincter tone, no lesions  Chaperone was present for exam.  Assessment:   Well woman visit with normal exam. VAIN II.   Observational management.  Osteopenia. ERT.   Plan: Mammogram screening discussed. Recommended self breast awareness. Pap and HR HPV as above.  Pap to [facility].   We talked about potential tx of persistent vaginal dysplasia. Guidelines for Calcium, Vitamin D, regular exercise program including cardiovascular and weight bearing exercise. Will continue to Premarin 0.625 mg daily.  Discussed WHI risks of PE, DVT, stroke.  Does not need refill for a couple of months.  Due for colonoscopy.  She  will return to [place].  BMD this fall when she has her mammogram.  This can be ordered through Dr. Laurann [place] or our office.  Follow up annually and prn.       After visit summary provided.

## 2016-04-29 ENCOUNTER — Other Ambulatory Visit (HOSPITAL_COMMUNITY)
Admission: RE | Admit: 2016-04-29 | Discharge: 2016-04-29 | Disposition: A | Discharge status: Home / Self Care | Payer: 59 | Source: Ambulatory Visit | Attending: Obstetrics and Gynecology | Admitting: Obstetrics and Gynecology

## 2016-04-29 ENCOUNTER — Ambulatory Visit (INDEPENDENT_AMBULATORY_CARE_PROVIDER_SITE_OTHER): Payer: 59 | Admitting: Obstetrics and Gynecology

## 2016-04-29 ENCOUNTER — Encounter: Payer: Self-pay | Admitting: Obstetrics and Gynecology

## 2016-04-29 VITALS — BP 122/76 | HR 70 | Resp 14 | Ht 67.0 in | Wt 140.2 lb

## 2016-04-29 DIAGNOSIS — Z01419 Encounter for gynecological examination (general) (routine) without abnormal findings: Secondary | ICD-10-CM | POA: Diagnosis not present

## 2016-04-29 DIAGNOSIS — R87810 Cervical high risk human papillomavirus (HPV) DNA test positive: Secondary | ICD-10-CM | POA: Insufficient documentation

## 2016-04-29 DIAGNOSIS — Z1151 Encounter for screening for human papillomavirus (HPV): Secondary | ICD-10-CM | POA: Insufficient documentation

## 2016-04-29 NOTE — Patient Instructions (Signed)

## 2016-05-05 LAB — CYTOLOGY - PAP
DIAGNOSIS: NEGATIVE
HPV 16/18/45 genotyping: NEGATIVE
HPV: DETECTED — AB

## 2016-05-06 ENCOUNTER — Encounter: Payer: Self-pay | Admitting: Obstetrics and Gynecology

## 2016-05-07 ENCOUNTER — Telehealth: Payer: Self-pay | Admitting: *Deleted

## 2016-05-07 NOTE — Telephone Encounter (Signed)
Please place in April 2018 recall so we keep track of her care.   Cc- Debra Nguyen

## 2016-05-07 NOTE — Telephone Encounter (Signed)
Left message to call Doctor Sheahan at [phone].  

## 2016-05-07 NOTE — Telephone Encounter (Signed)
Please ask the lab to run subtyping on the HR HPV to check for 16 and 18 subtypes.  Please let the patient know that I am adding this. After informing her, you may close the encounter.

## 2016-05-07 NOTE — Telephone Encounter (Signed)
Spoke with patient, advised of results and recommendations as seen below. Patient states she is currently at work and is unable to schedule. Patient states she would also like to review with Dr. Denman George prior to scheduling colposcopy. Patient states she will return call to schedule. Patient verbalizes understanding and is agreeable.   Dr. Quincy Simmonds, please advise on recall?

## 2016-05-07 NOTE — Telephone Encounter (Signed)
Dr. Quincy Simmonds, recall placed. Ok to close encounter?

## 2016-05-07 NOTE — Telephone Encounter (Signed)
Pap smear was sent to [facility]) instead of [facility]. Called [facility] [phone] and spoke with Peter Congo, she will had Subtype HR HPV 16/18.

## 2016-05-07 NOTE — Telephone Encounter (Signed)
-----   Message from Nunzio Cobbs, MD sent at 05/06/2016  9:49 PM EDT ----- Please report pap which is normal and high risk HPV test which is positive.  Due to the positive high risk HPV, colposcopy is indicated. It is possible that the pap is a false negative.  Her last colposcopy with biopsy 6 months ago showed HGSIL (VAIN II). I am not recommending any treatment at this time, but a careful look with the colposcope and possible biopsy is appropriate.  Cc- Marisa Sprinkles

## 2016-05-07 NOTE — Telephone Encounter (Signed)
Left detailed message on voicemail, name identified, ok per current dpr. Advised as seen below per Dr. Quincy Simmonds. Advised patient to return call to office at [phone] for any additional questions/concerns.  Spoke with lab tech, Tish, in lab. Will add subtyping to HR HPV for subtypes 16 and 18.   Routing to provider for final review. Patient is agreeable to disposition. Will close encounter.

## 2016-05-11 ENCOUNTER — Telehealth: Payer: Self-pay | Admitting: *Deleted

## 2016-05-11 MED FILL — METOPROLOL SUCC ER 25 MG TA: 25 | 90 days supply | Qty: 90 | Fill #2

## 2016-05-11 MED FILL — HYDROCHLOROTHIAZIDE 12.5 MG: 12.5 | 90 days supply | Qty: 90 | Fill #2

## 2016-05-11 NOTE — Telephone Encounter (Deleted)
-----   Message from Nunzio Cobbs, MD sent at 05/11/2016 12:56 PM EDT ----- Please report results to patient from HPV subtyping.  Her HR HPV status is positive, but this is negative for the more virulent strains called 16,18, and 45.  I am recommending that we proceed with a pap and HR HPV testing in 6 months.  It is ok to not do the colposcopy based on these additional results.  Please let me know if the patient has any questions.   She will need pap recall for September 2018.   Cc- Marisa Sprinkles

## 2016-05-11 NOTE — Telephone Encounter (Signed)
Spoke with patient, advised of results and recommendations as seen below per Dr. Quincy Simmonds. Patient states she agrees with this option and would like to schedule on 10/3 in the afternoon. Patient scheduled for OV for repeat pap and HPV on 10/3 at 3:30 pm with Dr. Quincy Simmonds. Patient verbalizes understanding and is agreeable to date and time.   06 recall   Routing to provider for final review. Patient is agreeable to disposition. Will close encounter.

## 2016-05-11 NOTE — Telephone Encounter (Signed)
Left message to call Kristoffer Bala at [phone].  

## 2016-05-11 NOTE — Telephone Encounter (Signed)
-----   Message from Nunzio Cobbs, MD sent at 05/11/2016 12:56 PM EDT ----- Please report results to patient from HPV subtyping.  Her HR HPV status is positive, but this is negative for the more virulent strains called 16,18, and 45.  I am recommending that we proceed with a pap and HR HPV testing in 6 months.  It is ok to not do the colposcopy based on these additional results.  Please let me know if the patient has any questions.   She will need pap recall for September 2018.   Cc- Marisa Sprinkles

## 2016-06-29 ENCOUNTER — Other Ambulatory Visit: Payer: Self-pay

## 2016-06-29 MED ORDER — ESTROGENS CONJUGATED 0.625 MG PO TABS: ORAL_TABLET | ORAL | 3 refills | Status: DC

## 2016-06-29 MED FILL — PREMARIN 0.625 MG TABLET: 0.625 | 90 days supply | Qty: 90 | Fill #0

## 2016-06-29 NOTE — Telephone Encounter (Signed)
Medication refill request: Estrogens, Conjugated, tablet Last AEX:  04/29/16 BS Next AEX: 05/05/17 BS Last MMG (if hormonal medication request): 01/08/16 BIRADS1, Density B, [facility] Refill authorized: 04/17/15 #[id number]. Please advise. Thank you.

## 2016-07-22 DIAGNOSIS — H04123 Dry eye syndrome of bilateral lacrimal glands: Secondary | ICD-10-CM | POA: Diagnosis not present

## 2016-07-22 DIAGNOSIS — H01113 Allergic dermatitis of right eye, unspecified eyelid: Secondary | ICD-10-CM | POA: Diagnosis not present

## 2016-07-22 DIAGNOSIS — H019 Unspecified inflammation of eyelid: Secondary | ICD-10-CM | POA: Diagnosis not present

## 2016-07-22 DIAGNOSIS — H01114 Allergic dermatitis of left upper eyelid: Secondary | ICD-10-CM | POA: Diagnosis not present

## 2016-07-27 DIAGNOSIS — R1032 Left lower quadrant pain: Secondary | ICD-10-CM | POA: Diagnosis not present

## 2016-07-29 ENCOUNTER — Encounter: Payer: Self-pay | Admitting: Obstetrics and Gynecology

## 2016-07-29 ENCOUNTER — Telehealth: Payer: Self-pay | Admitting: Obstetrics and Gynecology

## 2016-07-29 ENCOUNTER — Ambulatory Visit (INDEPENDENT_AMBULATORY_CARE_PROVIDER_SITE_OTHER): Payer: 59 | Admitting: Obstetrics and Gynecology

## 2016-07-29 VITALS — BP 118/68 | HR 64 | Temp 98.2°F | Resp 16 | Wt 140.0 lb

## 2016-07-29 DIAGNOSIS — R1032 Left lower quadrant pain: Secondary | ICD-10-CM | POA: Diagnosis not present

## 2016-07-29 NOTE — Progress Notes (Signed)
GYNECOLOGY  VISIT   HPI: 61 y.o.   Divorced  Caucasian  female   G2P2002 with Patient's last menstrual period was 02/02/2011.   here for  Pelvic ultraound for LLQ pain.   Saw PCP and had normal CBC with diff and negative urinalysis.  Seen yesterday for pelvic exam and no masses noted and normal vaginal appearance. Pain is still present today but it was more prominent 2 days ago.   She has had a hysterectomy.  Has known VAIN II.   Had diarrhea today.   States she completed her IFOB that her PCP gave her.  Asking if she should send it in.  Really worried that something is wrong.   GYNECOLOGIC HISTORY: Patient's last menstrual period was 02/02/2011. Contraception:  Hysterectomy Menopausal hormone therapy:  Premarin tablets Last mammogram:  01/08/16 BIRADS 1 negative Last pap smear:    04/29/16 HR HPV positive Colposcopy with biopy showed moderate to severe dysplasia 08/11/83. Had cryotherapy following this in [place], [place].  Pap 12/06/09 - LGSIL and positive HR HPV Pap 12/17/10 - CIN I. Status post hysterectomy 2013 for fibroids and focal CIN I w/clear margins. Pap 04/06/14 - showed LGSIL Colpo 05/14/14 - biopsy of left vaginal sidewall - VAIN II. Colposcopy with GYN ONC 07/27/14 - no high grade disease seen. No biopsies. Colposcopy 4-3-17revealed HPV changes but no dysplasia.  Pap 12-05-15 showed LGSIL and possible HGSIL. Biopsies showed LGSIL and HGSIL. Repeat colpo and pap every 6 months.        OB History    Gravida Para Term Preterm AB Living   2 2 2     2    SAB TAB Ectopic Multiple Live Births           2         Patient Active Problem List   Diagnosis Date Noted  . VAIN II (vaginal intraepithelial neoplasia grade II) 05/20/2015  . Synovial cyst of lumbar facet joint 02/05/2015  . History of abnormal Pap smear 12/30/2012  . Hypertension   . GERD (gastroesophageal reflux disease)     Past Medical History:  Diagnosis Date  . Anxiety   . Arthritis   . ASCUS  with positive high risk HPV 12/2006   Negative Colpo BX  . ASCUS with positive high risk HPV 2009  . CIN III (cervical intraepithelial neoplasia III) 07/1983   tx'd w/cryo- no subsequent abnormal paps until 2008  . Essential hypertension   . Fibroid 2006   3 cm Right Fundal Fibroid  . GERD (gastroesophageal reflux disease) 2011  . History of kidney stones   . Hypertension   . Synovial cyst of lumbar spine 10/2014  . Vitamin D deficiency     Past Surgical History:  Procedure Laterality Date  . ABDOMINAL HYSTERECTOMY  02/23/11   R TLH-fibroids, adenomyosis 221 g, & focal CIN I w/clear margins  . ANTERIOR CERVICAL DECOMP/DISCECTOMY FUSION N/A 07/19/2012   Procedure: ANTERIOR CERVICAL DECOMPRESSION/DISCECTOMY FUSION 2 LEVELS;  Surgeon: Erline Levine, MD;  Location: [place] NE[facility];  Service: Neurosurgery;  Laterality: N/A;  Anterior Cervical Five-Six Cervical Six-Seven Anterior Cervical Decompression/Diskectomy/Fusion  . CYSTOSCOPY  02/23/2011   PT.STATES SHE DID NOT HAVE  . LASIK  '99  . LUMBAR LAMINECTOMY/DECOMPRESSION MICRODISCECTOMY Right 02/05/2015   Procedure: Right Lumbar four-five Laminectomy with Resection of Synovial Cyst ;  Surgeon: Erline Levine, MD;  Location: [place] [facility];  Service: Neurosurgery;  Laterality: Right;  . TONSILLECTOMY AND ADENOIDECTOMY    . VAGINAL DELIVERY  '  92 '94    Current Outpatient Prescriptions  Medication Sig Dispense Refill  . Calcium Carbonate-Vitamin D (CALTRATE 600+D) 600-400 MG-UNIT per tablet Take 1 tablet by mouth daily.      Marland Kitchen estrogens, conjugated, (PREMARIN) 0.625 MG tablet Take one tablet 0.625 mg by mouth daily. 90 tablet 3  . hydrochlorothiazide (MICROZIDE) 12.5 MG capsule Take 12.5 mg by mouth daily.     Marland Kitchen ibuprofen (ADVIL,MOTRIN) 200 MG tablet Take 600 mg by mouth every 6 (six) hours as needed.    . metoprolol succinate (TOPROL-XL) 25 MG 24 hr tablet Take 25 mg by mouth daily.    . Multiple Vitamins-Minerals (MULTIVITAMINS THER. W/MINERALS) TABS  Take 1 tablet by mouth daily.       No current facility-administered medications for this visit.      ALLERGIES: Codeine and Ultram [tramadol]  Family History  Problem Relation Age of Onset  . Hypertension Mother   . Heart disease Mother   . Cancer Mother        lung  . Hypertension Father   . Heart attack Father     Social History   Social History  . Marital status: Divorced    Spouse name: N/A  . Number of children: N/A  . Years of education: N/A   Occupational History  . Not on file.   Social History Main Topics  . Smoking status: Never Smoker  . Smokeless tobacco: Never Used  . Alcohol use 6.0 - 7.2 oz/week    10 - 12 Glasses of wine per week     Comment: 10-12 glasses of wine a week  . Drug use: No  . Sexual activity: No     Comment: R-TLH   Other Topics Concern  . Not on file   Social History Narrative  . No narrative on file    ROS:  Pertinent items are noted in HPI.  PHYSICAL EXAMINATION:    BP 138/80 (BP Location: Right Arm, Patient Position: Sitting, Cuff Size: Normal)   Pulse 72   Resp 16   Wt 140 lb (63.5 kg)   LMP 02/02/2011   BMI 21.93 kg/m     General appearance: alert, cooperative and appears stated age   Pelvic ultrasound - normal ovaries bilaterally.  No free fluid.  Uterus absent.  Prominent bowel noted.    ASSESSMENT  Status post hysterectomy.  LLQ pain. ?Diverticulitis? Normal pelvic ultrasound.  Hx VAIN II followed conservatively with GYN oncology consultation.   PLAN  Dicussed other possible etiologies of LLQ pain including bowel origin. Discussed Cipro and Flagyl as tx for diverticulitis. Patient will send her IFOB to her PCP.  Proceed with CT of the abdomen and pelvis with and without contrast.   Patient is in agreement.  She expresses appreciation for her care.   An After Visit Summary was printed and given to the patient.  ____15__ minutes face to face time of which over 50% was spent in counseling.

## 2016-07-29 NOTE — Telephone Encounter (Signed)
Patient called requesting an appointment with Dr. Quincy Simmonds for abdominal pain.  Last seen: 04/29/16

## 2016-07-29 NOTE — Telephone Encounter (Signed)
07/30/16 8am [place] on hold for patient.

## 2016-07-29 NOTE — Telephone Encounter (Signed)
Spoke with patient. Patient reports LLQ pain that has been ongoing for last couple of months, worsened on 07/28/16. Patient reports pain not r/t eating, liquids, activity. Denies vaginal bleeding/discharge, fever, nausea, or urinary complaints. Patient reports she was evaluated by pcp Dr. Laurann [place] on 6/11 with no concern, UA and CBC completed, no results to date. Recommended OV for further evaluation, patient scheduled for today at 9:30am with Dr. Quincy Simmonds. Patient request that results from labs drawn at Dr. Delene Ruffini office on 6/11 be sent to Dr. Quincy Simmonds. RN advised will contact pcp office prior to appointment. Patient verbalizes understanding and is agreeable.   Call to [facility]/Dr. Laurann [place] at [phone]. Spoke with Jerene Pitch, will fax lab results dated 07/27/16 to [phone].

## 2016-07-29 NOTE — Telephone Encounter (Signed)
Thanks for the update.  She still has her ovaries and her tubes.  We may want to reserve a slot for ultrasound tomorrow am, first thing if there is an opening.

## 2016-07-29 NOTE — Telephone Encounter (Signed)
See office note from 07-29-16. Ultrasound scheduled for 07-30-16 at 0800. encounter closed.

## 2016-07-29 NOTE — Progress Notes (Signed)
GYNECOLOGY  VISIT   HPI: 60 y.o.   Divorced  Caucasian  female   G2P2002 with Patient's last menstrual period was 02/02/2011.   here for Left Lower Quadrant Pain for 3 months -- burning/twisting pain per patient not related to any activities or eating  Has some belching.  Taking Pepcid.  BM normal and daily and no effect on the pain.   No hard stool. Is doing IFOB with her PCP.  No problems with urination.  No fevers, nausea or vomiting.   Felt miserable at work yesterday due to pain.   Does deep water therapy and does not think she pulled anything.   Saw PCP 2 days ago. Had normal CBC and UA.   GYNECOLOGIC HISTORY: Patient's last menstrual period was 02/02/2011. Contraception:  Hysterectomy Menopausal hormone therapy:  Premarin tablets Last mammogram:  01/08/16 BIRADS 1 negative Last pap smear:   04/29/16 HR HPV positive Colposcopy with biopy showed moderate to severe dysplasia 08/11/83. Had cryotherapy following this in [place], [place].  Pap 12/06/09 - LGSIL and positive HR HPV Pap 12/17/10 - CIN I. Status post hysterectomy 2013 for fibroids and focal CIN I w/clear margins. Pap 04/06/14 - showed LGSIL Colpo 05/14/14 - biopsy of left vaginal sidewall - VAIN II. Colposcopy with GYN ONC 07/27/14 - no high grade disease seen. No biopsies. Colposcopy 05-20-15 revealed HPV changes but no dysplasia.  Pap 12-05-15 showed LGSIL and possible HGSIL. Biopsies showed LGSIL and HGSIL. Repeat colpo and pap every 6 months.        OB History    Gravida Para Term Preterm AB Living   2 2 2     2    SAB TAB Ectopic Multiple Live Births           2         Patient Active Problem List   Diagnosis Date Noted  . VAIN II (vaginal intraepithelial neoplasia grade II) 05/20/2015  . Synovial cyst of lumbar facet joint 02/05/2015  . History of abnormal Pap smear 12/30/2012  . Hypertension   . GERD (gastroesophageal reflux disease)     Past Medical History:  Diagnosis Date  . Anxiety   .  Arthritis   . ASCUS with positive high risk HPV 12/2006   Negative Colpo BX  . ASCUS with positive high risk HPV 2009  . CIN III (cervical intraepithelial neoplasia III) 07/1983   tx'd w/cryo- no subsequent abnormal paps until 2008  . Essential hypertension   . Fibroid 2006   3 cm Right Fundal Fibroid  . GERD (gastroesophageal reflux disease) 2011  . History of kidney stones   . Hypertension   . Synovial cyst of lumbar spine 10/2014  . Vitamin D deficiency     Past Surgical History:  Procedure Laterality Date  . ABDOMINAL HYSTERECTOMY  02/23/11   R TLH-fibroids, adenomyosis 221 g, & focal CIN I w/clear margins  . ANTERIOR CERVICAL DECOMP/DISCECTOMY FUSION N/A 07/19/2012   Procedure: ANTERIOR CERVICAL DECOMPRESSION/DISCECTOMY FUSION 2 LEVELS;  Surgeon: Erline Levine, MD;  Location: [facility];  Service: Neurosurgery;  Laterality: N/A;  Anterior Cervical Five-Six Cervical Six-Seven Anterior Cervical Decompression/Diskectomy/Fusion  . CYSTOSCOPY  02/23/2011   PT.STATES SHE DID NOT HAVE  . LASIK  '99  . LUMBAR LAMINECTOMY/DECOMPRESSION MICRODISCECTOMY Right 02/05/2015   Procedure: Right Lumbar four-five Laminectomy with Resection of Synovial Cyst ;  Surgeon: Erline Levine, MD;  Location: [facility];  Service: Neurosurgery;  Laterality: Right;  . TONSILLECTOMY AND ADENOIDECTOMY    .  VAGINAL DELIVERY  '92 '94    Current Outpatient Prescriptions  Medication Sig Dispense Refill  . Calcium Carbonate-Vitamin D (CALTRATE 600+D) 600-400 MG-UNIT per tablet Take 1 tablet by mouth daily.      Marland Kitchen estrogens, conjugated, (PREMARIN) 0.625 MG tablet Take one tablet 0.625 mg by mouth daily. 90 tablet 3  . hydrochlorothiazide (MICROZIDE) 12.5 MG capsule Take 12.5 mg by mouth daily.     Marland Kitchen ibuprofen (ADVIL,MOTRIN) 200 MG tablet Take 600 mg by mouth every 6 (six) hours as needed.    . metoprolol succinate (TOPROL-XL) 25 MG 24 hr tablet Take 25 mg by mouth daily.    . Multiple Vitamins-Minerals (MULTIVITAMINS  THER. W/MINERALS) TABS Take 1 tablet by mouth daily.       No current facility-administered medications for this visit.      ALLERGIES: Codeine and Ultram [tramadol]  Family History  Problem Relation Age of Onset  . Hypertension Mother   . Heart disease Mother   . Cancer Mother        lung  . Hypertension Father   . Heart attack Father     Social History   Social History  . Marital status: Divorced    Spouse name: N/A  . Number of children: N/A  . Years of education: N/A   Occupational History  . Not on file.   Social History Main Topics  . Smoking status: Never Smoker  . Smokeless tobacco: Never Used  . Alcohol use 6.0 - 7.2 oz/week    10 - 12 Glasses of wine per week     Comment: 10-12 glasses of wine a week  . Drug use: No  . Sexual activity: No     Comment: R-TLH   Other Topics Concern  . Not on file   Social History Narrative  . No narrative on file    ROS:  Pertinent items are noted in HPI.  PHYSICAL EXAMINATION:    BP 118/68 (BP Location: Right Arm, Patient Position: Sitting, Cuff Size: Normal)   Pulse 64   Temp 98.2 F (36.8 C) (Oral)   Resp 16   Wt 140 lb (63.5 kg)   LMP 02/02/2011   BMI 21.93 kg/m     General appearance: alert, cooperative and appears stated age Head: Normocephalic, without obvious abnormality, atraumatic.  Lungs: clear to auscultation bilaterally Heart: regular rate and rhythm Abdomen: soft, non-tender, no masses,  no organomegaly Back:  No CVA tenderness.  Pelvic: External genitalia:  no lesions              Urethra:  normal appearing urethra with no masses, tenderness or lesions              Bartholins and Skenes: normal                 Vagina: normal appearing vagina with normal color and discharge, no lesions              Cervix: absent. Bimanual Exam:  Uterus:   Absent.              Adnexa: no mass, fullness, tenderness on right.  Left adnexal tenderness without mass.              Rectal exam: Yes.  .   Confirms.              Anus:  normal sphincter tone, no lesions  Chaperone was present for exam.  ASSESSMENT  Status post hysterectomy.  Tubes and ovaries  remain.  LLQ pain. No acute abdomen.  Hx renal stones.  Hx VAIN II.  PLAN  Discussed possible etiologies of pain - adnexal pathology, diverticulosis, urinary system pathology, musculoskeletal origin.  Proceed with pelvic ultrasound tomorrow am.  If ultrasound, is normal, will order CT of abdomen and pelvis.    An After Visit Summary was printed and given to the patient.  __15____ minutes face to face time of which over 50% was spent in counseling.

## 2016-07-29 NOTE — Telephone Encounter (Signed)
07/30/16 [place] appointment for 12:30pm with 1:15pm consult held.   Labs placed on your desk for review.

## 2016-07-30 ENCOUNTER — Telehealth: Payer: Self-pay | Admitting: *Deleted

## 2016-07-30 ENCOUNTER — Ambulatory Visit (INDEPENDENT_AMBULATORY_CARE_PROVIDER_SITE_OTHER): Payer: 59

## 2016-07-30 ENCOUNTER — Ambulatory Visit (INDEPENDENT_AMBULATORY_CARE_PROVIDER_SITE_OTHER): Payer: 59 | Admitting: Obstetrics and Gynecology

## 2016-07-30 ENCOUNTER — Other Ambulatory Visit: Payer: Self-pay | Admitting: *Deleted

## 2016-07-30 ENCOUNTER — Telehealth: Payer: Self-pay | Admitting: Obstetrics and Gynecology

## 2016-07-30 ENCOUNTER — Encounter: Payer: Self-pay | Admitting: Obstetrics and Gynecology

## 2016-07-30 ENCOUNTER — Ambulatory Visit (HOSPITAL_COMMUNITY)
Admission: RE | Admit: 2016-07-30 | Discharge: 2016-07-30 | Disposition: A | Discharge status: Home / Self Care | Payer: 59 | Source: Ambulatory Visit | Attending: Obstetrics and Gynecology | Admitting: Obstetrics and Gynecology

## 2016-07-30 VITALS — BP 138/80 | HR 72 | Resp 16 | Wt 140.0 lb

## 2016-07-30 DIAGNOSIS — K429 Umbilical hernia without obstruction or gangrene: Secondary | ICD-10-CM | POA: Diagnosis not present

## 2016-07-30 DIAGNOSIS — N838 Other noninflammatory disorders of ovary, fallopian tube and broad ligament: Secondary | ICD-10-CM | POA: Diagnosis not present

## 2016-07-30 DIAGNOSIS — Z90710 Acquired absence of both cervix and uterus: Secondary | ICD-10-CM | POA: Diagnosis not present

## 2016-07-30 DIAGNOSIS — R1032 Left lower quadrant pain: Secondary | ICD-10-CM | POA: Insufficient documentation

## 2016-07-30 DIAGNOSIS — N9489 Other specified conditions associated with female genital organs and menstrual cycle: Secondary | ICD-10-CM | POA: Diagnosis not present

## 2016-07-30 DIAGNOSIS — I700 Atherosclerosis of aorta: Secondary | ICD-10-CM | POA: Insufficient documentation

## 2016-07-30 DIAGNOSIS — M47814 Spondylosis without myelopathy or radiculopathy, thoracic region: Secondary | ICD-10-CM | POA: Diagnosis not present

## 2016-07-30 LAB — POCT I-STAT CREATININE: CREATININE: 0.7 mg/dL (ref 0.44–1.00)

## 2016-07-30 MED ORDER — IOPAMIDOL (ISOVUE-300) INJECTION 61%
INTRAVENOUS | Status: AC
  Administered 2016-07-30: 100 mL via INTRAVENOUS
  Filled 2016-07-30: qty 100

## 2016-07-30 NOTE — Progress Notes (Signed)
Spoke with Irine Seal at [facility] Radiology scheduling. Irine Seal advised CT abdomen/pelvis to be ordered with contrast only. Patient scheduled for CT today at 3 pm, [facility] radiology. Patient aware NPO 4 hours prior to appointment, pick up contrast from radiology prior, drink 1st bottle at 1pm, second bottle at 2pm. Results to be called to office at [phone]. Patient verbalizes understanding and is agreeable.

## 2016-07-30 NOTE — Telephone Encounter (Signed)
Vanda calling from [facility] radiology. Patient scheduled for CT at 3pm today, requesting order for I Stat Creatinine to be faxed to [phone].  Written order to Dr. Quincy Simmonds for signature.  Order faxed to [phone], ATTN: Tedra Coupe.  Written order to scan.  Routing to provider for final review. Will close encounter.

## 2016-07-30 NOTE — Telephone Encounter (Signed)
Phone call to patient to report result of the CT of the abdomen and pelvis for the left lower quadrant.  CT unremarkable other than thoracolumbar spondylosis and some atherosclerosis of the aorta. Small 1.3 cm left adnexal cyst.   Patient noted pain in the sigmoid colon area with the pressure of the vaginal transducer with her pelvic ultrasoud  Patient is due for a colonoscopy.  She sees [facility] for her GI care and will follow through with this.  I encouraged her to proceed with this.   I also stated that she may need to see her spine surgeon as musculoskeletal pain can refer to the abdomen.   I offered a laparoscopy if the pain persists and she has a negative GI and spine evaluation.  She may have adhesive disease.

## 2016-07-31 DIAGNOSIS — Z1211 Encounter for screening for malignant neoplasm of colon: Secondary | ICD-10-CM | POA: Diagnosis not present

## 2016-08-10 MED FILL — HYDROCHLOROTHIAZIDE 12.5 MG: 12.5 | 90 days supply | Qty: 90 | Fill #0

## 2016-08-10 MED FILL — METOPROLOL SUCC ER 25 MG TA: 25 | 90 days supply | Qty: 90 | Fill #0

## 2016-08-12 ENCOUNTER — Encounter: Payer: Self-pay | Admitting: Gastroenterology

## 2016-09-14 ENCOUNTER — Ambulatory Visit (AMBULATORY_SURGERY_CENTER): Payer: Self-pay

## 2016-09-14 VITALS — Ht 67.0 in | Wt 141.4 lb

## 2016-09-14 DIAGNOSIS — Z1211 Encounter for screening for malignant neoplasm of colon: Secondary | ICD-10-CM

## 2016-09-14 MED ORDER — SUPREP BOWEL PREP KIT 17.5-3.13-1.6 GM/177ML PO SOLN: 1.0000 | Freq: Once | ORAL | 0 refills | Status: AC

## 2016-09-14 MED FILL — PREMARIN 0.625 MG TABLET: 0.625 | 90 days supply | Qty: 90 | Fill #1

## 2016-09-14 MED FILL — SUPREP BOWEL PREP KIT: 17.5-3.13-1 | 1 days supply | Qty: 354 | Fill #0

## 2016-09-14 NOTE — Progress Notes (Signed)
No allergies to eggs or soy No past problems with anesthesia No diet meds No home oxygen  Declined emmi 

## 2016-10-12 ENCOUNTER — Encounter: Payer: 59 | Admitting: Gastroenterology

## 2016-10-29 ENCOUNTER — Encounter: Payer: Self-pay | Admitting: Gastroenterology

## 2016-11-06 MED FILL — HYDROCHLOROTHIAZIDE 12.5 MG: 12.5 | 90 days supply | Qty: 90 | Fill #0

## 2016-11-06 MED FILL — METOPROLOL SUCC ER 25 MG TA: 25 | 90 days supply | Qty: 90 | Fill #0

## 2016-11-09 ENCOUNTER — Ambulatory Visit (AMBULATORY_SURGERY_CENTER): Payer: 59 | Admitting: Gastroenterology

## 2016-11-09 ENCOUNTER — Encounter: Payer: Self-pay | Admitting: Gastroenterology

## 2016-11-09 VITALS — BP 145/88 | HR 74 | Temp 99.1°F | Resp 18 | Ht 67.0 in | Wt 141.0 lb

## 2016-11-09 DIAGNOSIS — Z1211 Encounter for screening for malignant neoplasm of colon: Secondary | ICD-10-CM

## 2016-11-09 DIAGNOSIS — K219 Gastro-esophageal reflux disease without esophagitis: Secondary | ICD-10-CM | POA: Diagnosis not present

## 2016-11-09 DIAGNOSIS — I10 Essential (primary) hypertension: Secondary | ICD-10-CM | POA: Diagnosis not present

## 2016-11-09 DIAGNOSIS — K635 Polyp of colon: Secondary | ICD-10-CM | POA: Diagnosis not present

## 2016-11-09 DIAGNOSIS — Z1212 Encounter for screening for malignant neoplasm of rectum: Secondary | ICD-10-CM | POA: Diagnosis not present

## 2016-11-09 DIAGNOSIS — D120 Benign neoplasm of cecum: Secondary | ICD-10-CM

## 2016-11-09 MED ORDER — SODIUM CHLORIDE 0.9 % IV SOLN: 500.0000 mL | INTRAVENOUS | Status: DC

## 2016-11-09 NOTE — Op Note (Signed)
[facility] Patient Name: Ranyah Groeneveld Procedure Date: 11/09/2016 2:58 PM MRN: [id number] Endoscopist: Mauri Pole , MD Age: 61 Referring MD:  Date of Birth: 05-Oct-1955 Gender: Female Account #: [id number] Procedure:                Colonoscopy Indications:              Screening for colorectal malignant neoplasm Medicines:                Monitored Anesthesia Care Procedure:                Pre-Anesthesia Assessment:                           - Prior to the procedure, a History and Physical                            was performed, and patient medications and                            allergies were reviewed. The patient's tolerance of                            previous anesthesia was also reviewed. The risks                            and benefits of the procedure and the sedation                            options and risks were discussed with the patient.                            All questions were answered, and informed consent                            was obtained. Prior Anticoagulants: The patient has                            taken no previous anticoagulant or antiplatelet                            agents. ASA Grade Assessment: II - A patient with                            mild systemic disease. After reviewing the risks                            and benefits, the patient was deemed in                            satisfactory condition to undergo the procedure.                           After obtaining informed consent, the colonoscope  was passed under direct vision. Throughout the                            procedure, the patient's blood pressure, pulse, and                            oxygen saturations were monitored continuously. The                            Model PCF-[id number]) scope was introduced                            through the anus and advanced to the the cecum,                            identified by  appendiceal orifice and ileocecal                            valve. The patient tolerated the procedure well.                            The quality of the bowel preparation was excellent.                            The ileocecal valve, appendiceal orifice, and                            rectum were photographed. The colonoscopy was                            somewhat difficult due to restricted mobility of                            the colon and a tortuous colon. Scope In: 3:05:26 PM Scope Out: 3:20:25 PM Scope Withdrawal Time: 0 hours 8 minutes 26 seconds  Total Procedure Duration: 0 hours 14 minutes 59 seconds  Findings:                 The perianal and digital rectal examinations were                            normal.                           A 2 mm polyp was found in the cecum. The polyp was                            sessile. The polyp was removed with a cold biopsy                            forceps. Resection and retrieval were complete.                           Anal papilla(e) were hypertrophied.  Non-bleeding internal hemorrhoids were found during                            retroflexion. The hemorrhoids were small.                           The exam was otherwise without abnormality. Complications:            No immediate complications. Estimated Blood Loss:     Estimated blood loss was minimal. Impression:               - One 2 mm polyp in the cecum, removed with a cold                            biopsy forceps. Resected and retrieved.                           - Anal papilla(e) were hypertrophied.                           - Non-bleeding internal hemorrhoids.                           - The examination was otherwise normal. Recommendation:           - Patient has a contact number available for                            emergencies. The signs and symptoms of potential                            delayed complications were discussed with the                             patient. Return to normal activities tomorrow.                            Written discharge instructions were provided to the                            patient.                           - Resume previous diet.                           - Continue present medications.                           - Await pathology results.                           - Repeat colonoscopy in 5-10 years for surveillance                            based on pathology results. Mauri Pole, MD 11/09/2016 3:27:17 PM This report has been signed electronically.

## 2016-11-09 NOTE — Progress Notes (Signed)
Report given to PACU, vss 

## 2016-11-09 NOTE — Patient Instructions (Signed)
YOU HAD AN ENDOSCOPIC PROCEDURE TODAY AT THE [facility]:   Refer to the procedure report that was given to you for any specific questions about what was found during the examination.  If the procedure report does not answer your questions, please call your gastroenterologist to clarify.  If you requested that your care partner not be given the details of your procedure findings, then the procedure report has been included in a sealed envelope for you to review at your convenience later.  YOU SHOULD EXPECT: Some feelings of bloating in the abdomen. Passage of more gas than usual.  Walking can help get rid of the air that was put into your GI tract during the procedure and reduce the bloating. If you had a lower endoscopy (such as a colonoscopy or flexible sigmoidoscopy) you may notice spotting of blood in your stool or on the toilet paper. If you underwent a bowel prep for your procedure, you may not have a normal bowel movement for a few days.  Please Note:  You might notice some irritation and congestion in your nose or some drainage.  This is from the oxygen used during your procedure.  There is no need for concern and it should clear up in a day or so.  SYMPTOMS TO REPORT IMMEDIATELY:   Following lower endoscopy (colonoscopy or flexible sigmoidoscopy):  Excessive amounts of blood in the stool  Significant tenderness or worsening of abdominal pains  Swelling of the abdomen that is new, acute  Fever of 100F or higher  For urgent or emergent issues, a gastroenterologist can be reached at any hour by calling [phone].   DIET:  We do recommend a small meal at first, but then you may proceed to your regular diet.  Drink plenty of fluids but you should avoid alcoholic beverages for 24 hours.  ACTIVITY:  You should plan to take it easy for the rest of today and you should NOT DRIVE or use heavy machinery until tomorrow (because of the sedation medicines used during the test).     FOLLOW UP: Our staff will call the number listed on your records the next business day following your procedure to check on you and address any questions or concerns that you may have regarding the information given to you following your procedure. If we do not reach you, we will leave a message.  However, if you are feeling well and you are not experiencing any problems, there is no need to return our call.  We will assume that you have returned to your regular daily activities without incident.  If any biopsies were taken you will be contacted by phone or by letter within the next 1-3 weeks.  Please call us at [phone] if you have not heard about the biopsies in 3 weeks.   Await for biopsy results to determine next repeat Colonoscopy screening Polyps (handout given) Hemorrhoids (handout given)   SIGNATURES/CONFIDENTIALITY: You and/or your care partner have signed paperwork which will be entered into your electronic medical record.  These signatures attest to the fact that that the information above on your After Visit Summary has been reviewed and is understood.  Full responsibility of the confidentiality of this discharge information lies with you and/or your care-partner. 

## 2016-11-09 NOTE — Progress Notes (Signed)
Pt's states no medical or surgical changes since previsit or office visit. Maw  Pt had abdominal left flank pain.  Dr. Laurann Montana order a CBC - normal.  Pain continued and Dr. Quincy Simmonds ordered CT a abdomen.  Pt would like for you to see these results.  maw

## 2016-11-09 NOTE — Progress Notes (Signed)
Called to room to assist during endoscopic procedure.  Patient ID and intended procedure confirmed with present staff. Received instructions for my participation in the procedure from the performing physician.  

## 2016-11-10 ENCOUNTER — Telehealth: Payer: Self-pay | Admitting: *Deleted

## 2016-11-10 NOTE — Telephone Encounter (Signed)
No answer, message left for the patient. 

## 2016-11-10 NOTE — Telephone Encounter (Signed)
  Follow up Call-  Call back number 11/09/2016  Post procedure Call Back phone  # [phone] cell  Permission to leave phone message Yes  Some recent data might be hidden     Patient questions:  Do you have a fever, pain , or abdominal swelling? No. Pain Score  0 *  Have you tolerated food without any problems? Yes.    Have you been able to return to your normal activities? Yes.    Do you have any questions about your discharge instructions: Diet   No. Medications  No. Follow up visit  No.  Do you have questions or concerns about your Care? No.  Actions: * If pain score is 4 or above: No action needed, pain <4.

## 2016-11-11 DIAGNOSIS — D224 Melanocytic nevi of scalp and neck: Secondary | ICD-10-CM | POA: Diagnosis not present

## 2016-11-11 DIAGNOSIS — B078 Other viral warts: Secondary | ICD-10-CM | POA: Diagnosis not present

## 2016-11-11 DIAGNOSIS — L814 Other melanin hyperpigmentation: Secondary | ICD-10-CM | POA: Diagnosis not present

## 2016-11-11 DIAGNOSIS — L821 Other seborrheic keratosis: Secondary | ICD-10-CM | POA: Diagnosis not present

## 2016-11-11 DIAGNOSIS — D1801 Hemangioma of skin and subcutaneous tissue: Secondary | ICD-10-CM | POA: Diagnosis not present

## 2016-11-11 DIAGNOSIS — L570 Actinic keratosis: Secondary | ICD-10-CM | POA: Diagnosis not present

## 2016-11-11 DIAGNOSIS — Z23 Encounter for immunization: Secondary | ICD-10-CM | POA: Diagnosis not present

## 2016-11-11 DIAGNOSIS — D225 Melanocytic nevi of trunk: Secondary | ICD-10-CM | POA: Diagnosis not present

## 2016-11-16 ENCOUNTER — Encounter: Payer: Self-pay | Admitting: Gastroenterology

## 2016-11-18 ENCOUNTER — Ambulatory Visit (INDEPENDENT_AMBULATORY_CARE_PROVIDER_SITE_OTHER): Payer: 59 | Admitting: Obstetrics and Gynecology

## 2016-11-18 ENCOUNTER — Encounter: Payer: Self-pay | Admitting: Obstetrics and Gynecology

## 2016-11-18 ENCOUNTER — Other Ambulatory Visit (HOSPITAL_COMMUNITY)
Admission: RE | Admit: 2016-11-18 | Discharge: 2016-11-18 | Disposition: A | Discharge status: Home / Self Care | Payer: 59 | Source: Ambulatory Visit | Attending: Obstetrics and Gynecology | Admitting: Obstetrics and Gynecology

## 2016-11-18 VITALS — BP 126/70 | HR 76 | Resp 16 | Wt 139.0 lb

## 2016-11-18 DIAGNOSIS — Z01419 Encounter for gynecological examination (general) (routine) without abnormal findings: Secondary | ICD-10-CM | POA: Diagnosis not present

## 2016-11-18 DIAGNOSIS — Z87411 Personal history of vaginal dysplasia: Secondary | ICD-10-CM | POA: Diagnosis not present

## 2016-11-18 DIAGNOSIS — Z90710 Acquired absence of both cervix and uterus: Secondary | ICD-10-CM | POA: Insufficient documentation

## 2016-11-18 DIAGNOSIS — R87811 Vaginal high risk human papillomavirus (HPV) DNA test positive: Secondary | ICD-10-CM | POA: Diagnosis not present

## 2016-11-18 DIAGNOSIS — R87620 Atypical squamous cells of undetermined significance on cytologic smear of vagina (ASC-US): Secondary | ICD-10-CM | POA: Insufficient documentation

## 2016-11-18 NOTE — Progress Notes (Signed)
GYNECOLOGY  VISIT   HPI: 61 y.o.   Divorced  Caucasian  female   G2P2002 with Patient's last menstrual period was 02/02/2011.   here for repeat pap smear.  Last pap 04/29/16 negative, positive HR HPV.  Negative 16/18/45.   Last colpo was fall 2017 and dx VAIN II. This has been untreated to date.   No vaginal bleeding.   Had CT for LLQ pain in June 2018.  She had a 1.3 cm left adnexal cyst that was an incidental finding with no follow up recommended per radiology.  She thinks the pain is musculoskeletal. She has a synovial cyst of the lumbar region.   Went to [employer].  Brought me chocolate from [place] to thank for for helping in her work up LLQ pain.   GYNECOLOGIC HISTORY: Patient's last menstrual period was 02/02/2011. Contraception: Hysterectomy Menopausal hormone therapy:  Premarin Last mammogram:  01/08/16 BIRADS 1 negative/density b Last pap smear:   04/29/16 HR HPV positive Colposcopy with biopy showed moderate to severe dysplasia 08/11/83. Had cryotherapy following this in [place], [place].  Pap 12/06/09 - LGSIL and positive HR HPV Pap 12/17/10 - CIN I. Status post hysterectomy 2013 for fibroids and focal CIN I w/clear margins. Pap 04/06/14 - showed LGSIL Colpo 05/14/14 - biopsy of left vaginal sidewall - VAIN II. Colposcopy with GYN ONC 07/27/14 - no high grade disease seen. No biopsies. Colposcopy 4-3-17revealed HPV changes but no dysplasia.  Pap 12-05-15 showed LGSIL and possible HGSIL. Biopsies showed LGSIL and HGSIL. Repeat colpo and pap every 6 months.        OB History    Gravida Para Term Preterm AB Living   2 2 2     2    SAB TAB Ectopic Multiple Live Births           2         Patient Active Problem List   Diagnosis Date Noted  . VAIN II (vaginal intraepithelial neoplasia grade II) 05/20/2015  . Synovial cyst of lumbar facet joint 02/05/2015  . History of abnormal Pap smear 12/30/2012  . Hypertension   . GERD (gastroesophageal reflux disease)      Past Medical History:  Diagnosis Date  . Allergy   . Anxiety    Through divorce  . Arthritis   . ASCUS with positive high risk HPV 12/2006   Negative Colpo BX  . ASCUS with positive high risk HPV 2009  . Chronic kidney disease    kidney stones  . CIN III (cervical intraepithelial neoplasia III) 07/1983   tx'd w/cryo- no subsequent abnormal paps until 2008  . Essential hypertension   . Fibroid 2006   3 cm Right Fundal Fibroid  . GERD (gastroesophageal reflux disease) 2011  . History of kidney stones   . Hypertension   . Synovial cyst of lumbar spine 10/2014  . Vitamin D deficiency     Past Surgical History:  Procedure Laterality Date  . ABDOMINAL HYSTERECTOMY  02/23/11   R TLH-fibroids, adenomyosis 221 g, & focal CIN I w/clear margins  . ANTERIOR CERVICAL DECOMP/DISCECTOMY FUSION N/A 07/19/2012   Procedure: ANTERIOR CERVICAL DECOMPRESSION/DISCECTOMY FUSION 2 LEVELS;  Surgeon: Erline Levine, MD;  Location: [facility];  Service: Neurosurgery;  Laterality: N/A;  Anterior Cervical Five-Six Cervical Six-Seven Anterior Cervical Decompression/Diskectomy/Fusion  . COLONOSCOPY    . CYSTOSCOPY  02/23/2011   PT.STATES SHE DID NOT HAVE  . LASIK  '99  . LUMBAR LAMINECTOMY/DECOMPRESSION MICRODISCECTOMY Right 02/05/2015   Procedure: Right Lumbar  four-five Laminectomy with Resection of Synovial Cyst ;  Surgeon: Erline Levine, MD;  Location: [facility];  Service: Neurosurgery;  Laterality: Right;  . TONSILLECTOMY AND ADENOIDECTOMY    . VAGINAL DELIVERY  '92 '94    Current Outpatient Prescriptions  Medication Sig Dispense Refill  . Calcium Carbonate-Vitamin D (CALTRATE 600+D) 600-400 MG-UNIT per tablet Take 1 tablet by mouth daily.      Marland Kitchen estrogens, conjugated, (PREMARIN) 0.625 MG tablet Take one tablet 0.625 mg by mouth daily. 90 tablet 3  . famotidine (PEPCID) 10 MG tablet Take 10 mg by mouth as needed for heartburn or indigestion.    . hydrochlorothiazide (MICROZIDE) 12.5 MG capsule Take  12.5 mg by mouth daily.     Marland Kitchen ibuprofen (ADVIL,MOTRIN) 200 MG tablet Take 600 mg by mouth every 6 (six) hours as needed.    . metoprolol succinate (TOPROL-XL) 25 MG 24 hr tablet Take 25 mg by mouth daily.    . Multiple Vitamins-Minerals (MULTIVITAMINS THER. W/MINERALS) TABS Take 1 tablet by mouth daily.       No current facility-administered medications for this visit.      ALLERGIES: Codeine and Ultram [tramadol]  Family History  Problem Relation Age of Onset  . Hypertension Mother   . Heart disease Mother   . Cancer Mother        lung  . Hypertension Father   . Heart attack Father   . Colon cancer Neg Hx   . Esophageal cancer Neg Hx   . Pancreatic cancer Neg Hx   . Prostate cancer Neg Hx   . Rectal cancer Neg Hx   . Stomach cancer Neg Hx     Social History   Social History  . Marital status: Divorced    Spouse name: N/A  . Number of children: N/A  . Years of education: N/A   Occupational History  . Not on file.   Social History Main Topics  . Smoking status: Never Smoker  . Smokeless tobacco: Never Used  . Alcohol use 6.0 - 7.2 oz/week    10 - 12 Glasses of wine per week     Comment: 10-12 glasses of wine a week  . Drug use: No  . Sexual activity: No     Comment: R-TLH   Other Topics Concern  . Not on file   Social History Narrative  . No narrative on file    ROS:  Pertinent items are noted in HPI.  PHYSICAL EXAMINATION:    BP 126/70 (BP Location: Right Arm, Patient Position: Sitting, Cuff Size: Normal)   Pulse 76   Resp 16   Wt 139 lb (63 kg)   LMP 02/02/2011   BMI 21.77 kg/m     General appearance: alert, cooperative and appears stated age   Pelvic: External genitalia:  no lesions              Urethra:  normal appearing urethra with no masses, tenderness or lesions              Bartholins and Skenes: normal                 Vagina: normal appearing vagina with normal color and discharge, no lesions              Cervix:  Absent.                  Bimanual Exam:  Uterus:  Absent.  Adnexa: no mass, fullness, tenderness               Chaperone was present for exam.  ASSESSMENT  Status post robotic TLH.  Hx VAIN II.  Observational management.   PLAN  We discussed HPV subtypes and paps today.  We reviewed her hx of vaginal dysplasia which has been untreated to date.  I did review with her the option of Effudex therapy instead of surgical care if she has dysplasia with this evaluation.  She knows she may still need a colposcopy.    An After Visit Summary was printed and given to the patient.  ___15___ minutes face to face time of which over 50% was spent in counseling.

## 2016-11-23 LAB — CYTOLOGY - PAP
Diagnosis: UNDETERMINED — AB
HPV: DETECTED — AB

## 2016-11-25 DIAGNOSIS — Z0000 Encounter for general adult medical examination without abnormal findings: Secondary | ICD-10-CM | POA: Diagnosis not present

## 2016-11-25 DIAGNOSIS — I10 Essential (primary) hypertension: Secondary | ICD-10-CM | POA: Diagnosis not present

## 2016-11-25 DIAGNOSIS — E7800 Pure hypercholesterolemia, unspecified: Secondary | ICD-10-CM | POA: Diagnosis not present

## 2016-11-25 DIAGNOSIS — M545 Low back pain: Secondary | ICD-10-CM | POA: Diagnosis not present

## 2016-12-21 ENCOUNTER — Telehealth: Payer: Self-pay | Admitting: *Deleted

## 2016-12-21 NOTE — Telephone Encounter (Signed)
Returned patient's call and scheduled appt for November 19th at 1:15pm.

## 2016-12-22 MED FILL — PREMARIN 0.625 MG TABLET: 0.625 | 90 days supply | Qty: 90 | Fill #2

## 2016-12-22 MED FILL — SHINGRIX VIAL KIT: 50 | 30 days supply | Qty: 1 | Fill #0

## 2017-01-04 ENCOUNTER — Encounter: Payer: Self-pay | Admitting: Gynecologic Oncology

## 2017-01-04 ENCOUNTER — Ambulatory Visit: Payer: 59 | Attending: Gynecologic Oncology | Admitting: Gynecologic Oncology

## 2017-01-04 VITALS — BP 158/78 | HR 65 | Temp 98.2°F | Resp 18 | Ht 67.0 in | Wt 140.9 lb

## 2017-01-04 DIAGNOSIS — I129 Hypertensive chronic kidney disease with stage 1 through stage 4 chronic kidney disease, or unspecified chronic kidney disease: Secondary | ICD-10-CM | POA: Diagnosis not present

## 2017-01-04 DIAGNOSIS — K219 Gastro-esophageal reflux disease without esophagitis: Secondary | ICD-10-CM | POA: Diagnosis not present

## 2017-01-04 DIAGNOSIS — Z87442 Personal history of urinary calculi: Secondary | ICD-10-CM | POA: Insufficient documentation

## 2017-01-04 DIAGNOSIS — N189 Chronic kidney disease, unspecified: Secondary | ICD-10-CM | POA: Diagnosis not present

## 2017-01-04 DIAGNOSIS — Z90710 Acquired absence of both cervix and uterus: Secondary | ICD-10-CM | POA: Diagnosis not present

## 2017-01-04 DIAGNOSIS — Z79899 Other long term (current) drug therapy: Secondary | ICD-10-CM | POA: Insufficient documentation

## 2017-01-04 DIAGNOSIS — N891 Moderate vaginal dysplasia: Secondary | ICD-10-CM | POA: Diagnosis not present

## 2017-01-04 DIAGNOSIS — F419 Anxiety disorder, unspecified: Secondary | ICD-10-CM | POA: Diagnosis not present

## 2017-01-04 DIAGNOSIS — E559 Vitamin D deficiency, unspecified: Secondary | ICD-10-CM | POA: Insufficient documentation

## 2017-01-04 NOTE — Progress Notes (Signed)
Follow-up Note: Gyn-Onc  Consult was initially requested by Dr. Quincy Simmonds for the evaluation of Debra Nguyen 61 y.o. female with ASCUS, High risk HPV.  CC:  Chief Complaint  Patient presents with  . VAIN II (vaginal intraepithelial neoplasia grade II)    Assessment/Plan:  [name]  is a 61 y.o.  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 Recent pap with ASCUS and high risk HPV. No high grade changes seen on today's colpo.  Will follow-up the results of today's biopsy. If VAIN I or II we will plan for close surveillance with repeat HPV testing and pap in 12 months.  I discussed that her persistent low grade changes will likely be present for life, given the natural history of her persistent low grade disease, I do not feel that 6 monthly evaluations are necessary, nor is therapeutic intervention should they remain stable.  HPI: The patient is a very pleasant 61 year old gravida 2 para 2 who is seen in consultation at the request of Dr. Quincy Simmonds for persistent high risk HPV and moderate vaginal dysplasia. Patient has a 35 year history of initially cervical, and then vaginal dysplasia. She's had multiple excisional or ablative procedures throughout the years including cryotherapy remotely and a hysterectomy performed in 2013 for symptomatic uterine fibroids in the setting of low-grade cervical dysplasia. She developed vaginal dysplasia post-hysterectomy with a Pap smear in February 2016 showing low-grade squamous intraepithelial lesion and positive for high-risk HPV, and colposcopy in March 2016 revealed moderate grade dysplasia in the left vaginal vault in 2 very small foci one of which was biopsied to remove it and demonstrated VAIN II.  The patient has no concerning symptoms including no bleeding or discharge from the vagina, no lower extremity edema, no back pain, no dyspareunia. She had a pap in September 2016 which showed LSIL with  positive hrHPV. She also had colpo in September 2016 with Dr Quincy Simmonds which showed persistent area at the left vaginal cuff that appeared abnormal and was biopsied at 2 sites. One site showed VAIN I and the other showed VAIN II. Dr Quincy Simmonds had recommended laser.  Interval History:  The patient had a pap smear on 04/17/15 with Dr Quincy Simmonds which showed LGSIL, with hr HPV detected. Biopsy on 05/20/15 showed koilocytic atypia consistent with HPV.  She went on to have repeat pap testing with HPV testing on 11/18/16 which showed ASCUS with positive high risk HPV.  Current Meds:  Outpatient Encounter Medications as of 01/04/2017  Medication Sig  . Calcium Carbonate-Vitamin D (CALTRATE 600+D) 600-400 MG-UNIT per tablet Take 1 tablet by mouth daily.    Marland Kitchen estrogens, conjugated, (PREMARIN) 0.625 MG tablet Take one tablet 0.625 mg by mouth daily.  . famotidine (PEPCID) 10 MG tablet Take 10 mg by mouth as needed for heartburn or indigestion.  . hydrochlorothiazide (MICROZIDE) 12.5 MG capsule Take 12.5 mg by mouth daily.   Marland Kitchen ibuprofen (ADVIL,MOTRIN) 200 MG tablet Take 600 mg by mouth every 6 (six) hours as needed.  . metoprolol succinate (TOPROL-XL) 25 MG 24 hr tablet Take 25 mg by mouth daily.  . Multiple Vitamins-Minerals (MULTIVITAMINS THER. W/MINERALS) TABS Take 1 tablet by mouth daily.     No facility-administered encounter medications on file as of 01/04/2017.     Allergy:  Allergies  Allergen Reactions  . Codeine Itching  . Ultram [Tramadol] Itching    Social Hx:   Social History   Socioeconomic History  . Marital  status: Divorced    Spouse name: Not on file  . Number of children: Not on file  . Years of education: Not on file  . Highest education level: Not on file  Social Needs  . Financial resource strain: Not on file  . Food insecurity - worry: Not on file  . Food insecurity - inability: Not on file  . Transportation needs - medical: Not on file  . Transportation needs - non-medical: Not  on file  Occupational History  . Not on file  Tobacco Use  . Smoking status: Never Smoker  . Smokeless tobacco: Never Used  Substance and Sexual Activity  . Alcohol use: Yes    Alcohol/week: 6.0 - 7.2 oz    Types: 10 - 12 Glasses of wine per week    Comment: 10-12 glasses of wine a week  . Drug use: No  . Sexual activity: No    Partners: Male    Birth control/protection: Post-menopausal, Surgical    Comment: R-TLH  Other Topics Concern  . Not on file  Social History Narrative  . Not on file    Past Surgical Hx:  Past Surgical History:  Procedure Laterality Date  . ABDOMINAL HYSTERECTOMY  02/23/11   R TLH-fibroids, adenomyosis 221 g, & focal CIN I w/clear margins  . ANTERIOR CERVICAL DECOMPRESSION/DISCECTOMY FUSION 2 LEVELS N/A 07/19/2012   Performed by Erline Levine, MD at [facility] NEURO ORS  . COLONOSCOPY    . CYSTOSCOPY  02/23/2011   Performed by Peri Maris, MD at [facility] ORS  . LASIK  '99  . Right Lumbar four-five Laminectomy with Resection of Synovial Cyst  Right 02/05/2015   Performed by Erline Levine, MD at [facility] NEURO ORS  . ROBOTIC ASSISTED TOTAL HYSTERECTOMY N/A 02/23/2011   Performed by Peri Maris, MD at [facility] ORS  . TONSILLECTOMY AND ADENOIDECTOMY    . VAGINAL DELIVERY  '92 '94    Past Medical Hx:  Past Medical History:  Diagnosis Date  . Allergy   . Anxiety    Through divorce  . Arthritis   . ASCUS with positive high risk HPV 12/2006   Negative Colpo BX  . ASCUS with positive high risk HPV 2009  . Chronic kidney disease    kidney stones  . CIN III (cervical intraepithelial neoplasia III) 07/1983   tx'd w/cryo- no subsequent abnormal paps until 2008  . Essential hypertension   . Fibroid 2006   3 cm Right Fundal Fibroid  . GERD (gastroesophageal reflux disease) 2011  . History of kidney stones   . Hypertension   . Synovial cyst of lumbar spine 10/2014  . Vitamin D deficiency     Past Gynecological History:  SVD x 2  Patient's last menstrual period was  02/02/2011.  Family Hx:  Family History  Problem Relation Age of Onset  . Hypertension Mother   . Heart disease Mother   . Cancer Mother        lung  . Hypertension Father   . Heart attack Father   . Colon cancer Neg Hx   . Esophageal cancer Neg Hx   . Pancreatic cancer Neg Hx   . Prostate cancer Neg Hx   . Rectal cancer Neg Hx   . Stomach cancer Neg Hx     Review of Systems:  Constitutional  Feels well,    ENT Normal appearing ears and nares bilaterally Skin/Breast  No rash, sores, jaundice, itching, dryness Cardiovascular  No chest pain, shortness of breath,  or edema  Pulmonary  No cough or wheeze.  Gastro Intestinal  No nausea, vomitting, or diarrhoea. No bright red blood per rectum, no abdominal pain, change in bowel movement, or constipation.  Genito Urinary  No frequency, urgency, dysuria, see above Musculo Skeletal  No myalgia, arthralgia, joint swelling or pain  Neurologic  No weakness, numbness, change in gait,  Psychology  No depression, anxiety, insomnia.   Vitals:  Blood pressure (!) 158/78, pulse 65, temperature 98.2 F (36.8 C), temperature source Oral, resp. rate 18, height 5\' 7"  (1.702 m), weight 140 lb 14.4 oz (63.9 kg), last menstrual period 02/02/2011, SpO2 100 %.  Physical Exam: Lymph node survey: no suspicious inguinal nodes Abdomen: soft, nondistended.  Pelvic: colpscopic evaluation performed of entire vagina with 4% acetic acid. No gross areas of dysplasia noted, however, inadquate colposcopic eval of left fornix took place due to tethering, therefore a blind biopsy of the fornix took place with forceps. Hemostasis obtained with silver nitrate.  Donaciano Eva, MD   01/04/2017, 1:40 PM

## 2017-01-04 NOTE — Patient Instructions (Signed)
If your biopsies from today's colposcopy show VAIN 3, we will recommend effudex cream. If they show low grade dysplasia, no action is required and I am recommending annual paps with HPV testing.  Treatment of low grade dysplasia will not decrease the risk for recurrence of this disease, therefore we do not recommend treating persistent low grade dysplasia.

## 2017-01-14 ENCOUNTER — Encounter (INDEPENDENT_AMBULATORY_CARE_PROVIDER_SITE_OTHER): Payer: Self-pay

## 2017-01-27 ENCOUNTER — Other Ambulatory Visit: Payer: Self-pay | Admitting: Obstetrics and Gynecology

## 2017-01-27 ENCOUNTER — Ambulatory Visit
Admission: RE | Admit: 2017-01-27 | Discharge: 2017-01-27 | Disposition: A | Discharge status: Home / Self Care | Payer: 59 | Source: Ambulatory Visit | Attending: Obstetrics and Gynecology | Admitting: Obstetrics and Gynecology

## 2017-01-27 DIAGNOSIS — Z1231 Encounter for screening mammogram for malignant neoplasm of breast: Secondary | ICD-10-CM | POA: Diagnosis not present

## 2017-02-04 MED FILL — HYDROCHLOROTHIAZIDE 12.5 MG: 12.5 | 90 days supply | Qty: 90 | Fill #0

## 2017-02-04 MED FILL — METOPROLOL SUCC ER 25 MG TA: 25 | 90 days supply | Qty: 90 | Fill #0

## 2017-02-23 MED FILL — SHINGRIX 50 MCG SUS: 50 | 30 days supply | Qty: 1 | Fill #1

## 2017-03-01 MED FILL — PREMARIN 0.625 MG TABLET: 0.625 | 90 days supply | Qty: 90 | Fill #3

## 2017-04-14 MED FILL — AMOXICILLIN 500 MG CAPSULE: 500 | 10 days supply | Qty: 40 | Fill #0

## 2017-05-05 ENCOUNTER — Encounter: Payer: Self-pay | Admitting: Obstetrics and Gynecology

## 2017-05-05 ENCOUNTER — Other Ambulatory Visit: Payer: Self-pay

## 2017-05-05 ENCOUNTER — Ambulatory Visit (INDEPENDENT_AMBULATORY_CARE_PROVIDER_SITE_OTHER): Payer: No Typology Code available for payment source | Admitting: Obstetrics and Gynecology

## 2017-05-05 VITALS — BP 152/78 | HR 80 | Resp 16 | Ht 66.5 in | Wt 141.0 lb

## 2017-05-05 DIAGNOSIS — Z01419 Encounter for gynecological examination (general) (routine) without abnormal findings: Secondary | ICD-10-CM | POA: Diagnosis not present

## 2017-05-05 MED ORDER — ESTROGENS CONJUGATED 0.625 MG PO TABS: ORAL_TABLET | ORAL | 3 refills | Status: DC

## 2017-05-05 NOTE — Progress Notes (Signed)
62 y.o. G72P2002 Divorced Caucasian female here for annual exam.    Happy on her ERT.   Has persistent VAIN II.  No treatment.  Also seeing Dr. Denman George who is now recommending pap and HR HPV testing once yearly as long as she has VAIN I or II.   Labs with PCP.   PCP:   Kelton Pillar, MD  Patient's last menstrual period was 02/02/2011.           Sexually active: No.  The current method of family planning is status post hysterectomy and post menopausal status.    Exercising: Yes.    walking and deep water aerobics Smoker:  no  Health Maintenance: Pap:  11/18/16 ASCUS:Pos HR HPV; 01/04/17 Biopsy showing VAIN II History of abnormal Pap:  04/29/16 HR HPV positive Colposcopy with biopy showed moderate to severe dysplasia 08/11/83. Had cryotherapy following this in [place], [place].  Pap 12/06/09 - LGSIL and positive HR HPV Pap 12/17/10 - CIN I. Status post hysterectomy 2013 for fibroids and focal CIN I w/clear margins. Pap 04/06/14 - showed LGSIL Colpo 05/14/14 - biopsy of left vaginal sidewall - VAIN II. Colposcopy with GYN ONC 07/27/14 - no high grade disease seen. No biopsies. Colposcopy 4-3-17revealed HPV changes but no dysplasia.  Pap 12-05-15 showed LGSIL and possible HGSIL. Biopsies showed LGSIL and HGSIL.    MMG:  01/27/17 BIRADS 1 negative/density b Colonoscopy:  11/09/16 Polyp removed; f/u 10 years BMD:   10/18/13  Result  Osteopenia of hip/normal spine with Dr. Carmelina Dane TDaP:  Up to date per patient Gardasil:   n/a HIV: donated blood  Hep C: donated blood Screening Labs:  PCP   reports that  has never smoked. she has never used smokeless tobacco. She reports that she drinks about 6.0 - 7.2 oz of alcohol per week. She reports that she does not use drugs.  Past Medical History:  Diagnosis Date  . Allergy   . Anxiety    Through divorce  . Arthritis   . ASCUS with positive high risk HPV 12/2006   Negative Colpo BX  . ASCUS with positive high risk HPV 2009  . Chronic  kidney disease    kidney stones  . CIN III (cervical intraepithelial neoplasia III) 07/1983   tx'd w/cryo- no subsequent abnormal paps until 2008  . Essential hypertension   . Fibroid 2006   3 cm Right Fundal Fibroid  . GERD (gastroesophageal reflux disease) 2011  . History of kidney stones   . Hypertension   . Synovial cyst of lumbar spine 10/2014  . Vitamin D deficiency     Past Surgical History:  Procedure Laterality Date  . ABDOMINAL HYSTERECTOMY  02/23/11   R TLH-fibroids, adenomyosis 221 g, & focal CIN I w/clear margins  . ANTERIOR CERVICAL DECOMP/DISCECTOMY FUSION N/A 07/19/2012   Procedure: ANTERIOR CERVICAL DECOMPRESSION/DISCECTOMY FUSION 2 LEVELS;  Surgeon: Erline Levine, MD;  Location: [facility] NEURO ORS;  Service: Neurosurgery;  Laterality: N/A;  Anterior Cervical Five-Six Cervical Six-Seven Anterior Cervical Decompression/Diskectomy/Fusion  . COLONOSCOPY    . CYSTOSCOPY  02/23/2011   PT.STATES SHE DID NOT HAVE  . LASIK  '99  . LUMBAR LAMINECTOMY/DECOMPRESSION MICRODISCECTOMY Right 02/05/2015   Procedure: Right Lumbar four-five Laminectomy with Resection of Synovial Cyst ;  Surgeon: Erline Levine, MD;  Location: [facility] NEURO ORS;  Service: Neurosurgery;  Laterality: Right;  . TONSILLECTOMY AND ADENOIDECTOMY    . VAGINAL DELIVERY  '92 '94    Current Outpatient Medications  Medication Sig Dispense Refill  .  Calcium Carbonate-Vitamin D (CALTRATE 600+D) 600-400 MG-UNIT per tablet Take 1 tablet by mouth daily.      Marland Kitchen estrogens, conjugated, (PREMARIN) 0.625 MG tablet Take one tablet 0.625 mg by mouth daily. 90 tablet 3  . hydrochlorothiazide (MICROZIDE) 12.5 MG capsule Take 12.5 mg by mouth daily.     Marland Kitchen ibuprofen (ADVIL,MOTRIN) 200 MG tablet Take 600 mg by mouth every 6 (six) hours as needed.    . metoprolol succinate (TOPROL-XL) 25 MG 24 hr tablet Take 25 mg by mouth daily.    . Multiple Vitamins-Minerals (MULTIVITAMINS THER. W/MINERALS) TABS Take 1 tablet by mouth daily.       No current  facility-administered medications for this visit.     Family History  Problem Relation Age of Onset  . Hypertension Mother   . Heart disease Mother   . Cancer Mother        lung  . Hypertension Father   . Heart attack Father   . Colon cancer Neg Hx   . Esophageal cancer Neg Hx   . Pancreatic cancer Neg Hx   . Prostate cancer Neg Hx   . Rectal cancer Neg Hx   . Stomach cancer Neg Hx     ROS:  Pertinent items are noted in HPI.  Otherwise, a comprehensive ROS was negative.  Exam:   BP (!) 152/78 (BP Location: Right Arm, Patient Position: Sitting, Cuff Size: Normal)   Pulse 80   Resp 16   Ht 5' 6.5" (1.689 m)   Wt 141 lb (64 kg)   LMP 02/02/2011   BMI 22.42 kg/m     General appearance: alert, cooperative and appears stated age Head: Normocephalic, without obvious abnormality, atraumatic Neck: no adenopathy, supple, symmetrical, trachea midline and thyroid normal to inspection and palpation Lungs: clear to auscultation bilaterally Breasts: normal appearance, no masses or tenderness, No nipple retraction or dimpling, No nipple discharge or bleeding, No axillary or supraclavicular adenopathy Heart: regular rate and rhythm Abdomen: soft, non-tender; no masses, no organomegaly Extremities: extremities normal, atraumatic, no cyanosis or edema Skin: Skin color, texture, turgor normal. No rashes or lesions Lymph nodes: Cervical, supraclavicular, and axillary nodes normal. No abnormal inguinal nodes palpated Neurologic: Grossly normal  Pelvic: External genitalia:  no lesions              Urethra:  normal appearing urethra with no masses, tenderness or lesions              Bartholins and Skenes: normal                 Vagina: normal appearing vagina with normal color and discharge, no lesions              Cervix: absent.              Pap taken: No. Bimanual Exam:  Uterus:   absent              Adnexa: no mass, fullness, tenderness              Rectal exam: Yes.  .  Confirms.               Anus:  normal sphincter tone, no lesions  Chaperone was present for exam.  Assessment:   Well woman visit with normal exam. Status post robotic TLH.  Tubes and ovaries remain.  Hx CIN III.  Long standing VAIN II.  Not treated.  Oncology recommending pap and HR HPV yearly.  Osteopenia.  ERT.  Plan: Mammogram screening discussed. Recommended self breast awareness. Guidelines for Calcium, Vitamin D, regular exercise program including cardiovascular and weight bearing exercise. Refill of oral Premarin for one year.  Will will likely extend this ERT until her next annual exam which may end of being in October 2020.  We discussed risks of stroke, DVT, PE, and possible breast cancer.  She declines a transdermal form of ERT. We want to get her on an annual pap schedule.  Due for BMD this year through PCP. Labs with PCP. Pap and HR HPV yearly, which will next be in October 2019.   Follow up annually and prn.   After visit summary provided.

## 2017-05-05 NOTE — Patient Instructions (Signed)

## 2017-05-10 MED FILL — HYDROCHLOROTHIAZIDE 12.5 MG: 12.5 | 90 days supply | Qty: 90 | Fill #1

## 2017-05-10 MED FILL — METOPROLOL SUCCINATE ER 25: 25 | 90 days supply | Qty: 90 | Fill #1

## 2017-05-26 ENCOUNTER — Encounter: Payer: Self-pay | Admitting: Nurse Practitioner

## 2017-05-26 ENCOUNTER — Ambulatory Visit (INDEPENDENT_AMBULATORY_CARE_PROVIDER_SITE_OTHER): Payer: Self-pay | Admitting: Nurse Practitioner

## 2017-05-26 VITALS — BP 120/82 | HR 76 | Temp 98.1°F | Wt 140.8 lb

## 2017-05-26 DIAGNOSIS — J029 Acute pharyngitis, unspecified: Secondary | ICD-10-CM

## 2017-05-26 LAB — POCT RAPID STREP A (OFFICE): RAPID STREP A SCREEN: NEGATIVE

## 2017-05-26 MED ORDER — PENICILLIN V POTASSIUM 500 MG PO TABS: 500.0000 mg | ORAL_TABLET | Freq: Four times a day (QID) | ORAL | 0 refills | Status: AC

## 2017-05-26 MED FILL — PENICILLIN VK 500 MG TABLET: 500 | 10 days supply | Qty: 40 | Fill #0

## 2017-05-26 NOTE — Patient Instructions (Signed)

## 2017-05-26 NOTE — Progress Notes (Addendum)
   Subjective:    Patient ID: Debra Nguyen, female    DOB: 02-11-1956, 62 y.o.   MRN: [id number]  The patient is a 62 year old female who presents with complaints of a sore throat for about 4 weeks.  The patient states she was treated for bronchitis and acute sinusitis and prescribed amoxicillin about a week or 2 prior to her symptoms.  Since that time patient has developed new onset of sore throat and denies any other symptoms such as fever chills cough earache nasal congestion or chest congestion.  Patient states she has been using warm salt water gargles Advil and cough medications for her symptoms with minor relief.  Patient's past medical history, allergies, and medications were reviewed.  Review of Systems  Constitutional: Negative for appetite change, fatigue and fever.  HENT: Positive for sore throat. Negative for ear discharge, ear pain, postnasal drip, sinus pressure and sinus pain.   Eyes: Negative.   Respiratory: Negative.   Cardiovascular: Negative.   Gastrointestinal: Negative.        History of GERD  Allergic/Immunologic: Negative.   Neurological: Negative.        Objective:     Physical Exam  Constitutional: She is oriented to person, place, and time. She appears well-developed and well-nourished. No distress.  HENT:  Head: Normocephalic and atraumatic.  Right Ear: External ear normal.  Left Ear: External ear normal.  Mild oropharyngeal erythema tonsils 1 on right and one on the left no exudates  Eyes: Pupils are equal, round, and reactive to light. Conjunctivae and EOM are normal.  Neck: Normal range of motion. Neck supple. No tracheal deviation present. No thyromegaly present.  Cardiovascular: Normal rate, regular rhythm and normal heart sounds.  Pulmonary/Chest: Effort normal and breath sounds normal. No respiratory distress. She has no wheezes.  Abdominal: Soft. Bowel sounds are normal. She exhibits no distension. There is no tenderness.  Neurological: She  is alert and oriented to person, place, and time. No cranial nerve deficit.  Skin: Skin is warm and dry.  Psychiatric: She has a normal mood and affect. Her behavior is normal. Judgment and thought content normal.   Assessment & Plan:  Assessment Pharyngitis 1.  Rapid strep test negative.  Patient requesting penicillin VK as this has worked for her in the past and due to duration of symptoms provider is in agreement. 2.  Warm salt water gargle, honey with lemon or plain honey, patient can also use warm or cool liquids for comfort. 3.  Patient will go to ER if difficulty swallowing difficulty breathing drooling or other concerns. 4.  Patient verbalizes understanding and has no questions at time of discharge Meds ordered this encounter  Medications  . penicillin v potassium (VEETID) 500 MG tablet    Sig: Take 1 tablet (500 mg total) by mouth 4 (four) times daily for 10 days.    Dispense:  40 tablet    Refill:  0    Order Specific Question:   Supervising Provider    Answer:   Ricard Dillon [phone]

## 2017-06-09 ENCOUNTER — Telehealth: Payer: Self-pay

## 2017-06-18 MED FILL — PREMARIN 0.625 MG TABLET: 0.625 | 90 days supply | Qty: 90 | Fill #0

## 2017-07-27 MED FILL — HYDROCHLOROTHIAZIDE 12.5 MG: 12.5 | 90 days supply | Qty: 90 | Fill #2

## 2017-07-27 MED FILL — METOPROLOL SUCCINATE ER 25: 25 | 90 days supply | Qty: 90 | Fill #2

## 2017-09-20 MED FILL — PREMARIN 0.625 MG TABLET: 0.625 | 90 days supply | Qty: 90 | Fill #1

## 2017-10-27 MED FILL — METOPROLOL SUCCINATE ER 25: 25 | 90 days supply | Qty: 90 | Fill #0

## 2017-10-27 MED FILL — HYDROCHLOROTHIAZIDE 12.5 MG: 12.5 | 90 days supply | Qty: 90 | Fill #3

## 2017-12-01 ENCOUNTER — Ambulatory Visit: Payer: No Typology Code available for payment source | Admitting: Obstetrics and Gynecology

## 2017-12-13 MED FILL — PREMARIN 0.625 MG TABLET: 0.625 | 90 days supply | Qty: 90 | Fill #2

## 2017-12-13 MED FILL — VIT D2 1.25 MG (50,000 UNIT: 1.25 MG | 28 days supply | Qty: 4 | Fill #0

## 2017-12-14 ENCOUNTER — Other Ambulatory Visit: Payer: Self-pay | Admitting: Family Medicine

## 2017-12-14 DIAGNOSIS — M8580 Other specified disorders of bone density and structure, unspecified site: Secondary | ICD-10-CM

## 2017-12-15 ENCOUNTER — Other Ambulatory Visit: Payer: Self-pay

## 2017-12-15 ENCOUNTER — Other Ambulatory Visit (HOSPITAL_COMMUNITY)
Admission: RE | Admit: 2017-12-15 | Discharge: 2017-12-15 | Disposition: A | Discharge status: Home / Self Care | Payer: No Typology Code available for payment source | Source: Ambulatory Visit | Attending: Obstetrics and Gynecology | Admitting: Obstetrics and Gynecology

## 2017-12-15 ENCOUNTER — Encounter: Payer: Self-pay | Admitting: Obstetrics and Gynecology

## 2017-12-15 ENCOUNTER — Ambulatory Visit (INDEPENDENT_AMBULATORY_CARE_PROVIDER_SITE_OTHER): Payer: No Typology Code available for payment source | Admitting: Obstetrics and Gynecology

## 2017-12-15 VITALS — BP 124/64 | HR 70 | Wt 135.4 lb

## 2017-12-15 DIAGNOSIS — N891 Moderate vaginal dysplasia: Secondary | ICD-10-CM | POA: Diagnosis not present

## 2017-12-15 NOTE — Progress Notes (Signed)
GYNECOLOGY  VISIT   HPI: 62 y.o.   Divorced  Caucasian  female   G2P2002 with Patient's last menstrual period was 02/02/2011.here for pap smear.  No vaginal bleeding or unusual discharge.   Has persistent VAIN II.  No treatment.  Also seeing Dr. Denman George who is now recommending pap and HR HPV testing once yearly as long as she has VAIN I or II.   Colposcopy with biopy showed moderate to severe dysplasia 08/11/83. Had cryotherapy following this in [place], [place].  Pap 12/06/09 - LGSIL and positive HR HPV Pap 12/17/10 - CIN I. Status post hysterectomy 2013 for fibroids and focal CIN I w/clear margins. Pap 04/06/14 - showed LGSIL Colpo 05/14/14 - biopsy of left vaginal sidewall - VAIN II. Colposcopy with GYN ONC 07/27/14 - no high grade disease seen. No biopsies. Colposcopy 4-3-17revealed HPV changes but no dysplasia.  Pap 12-05-15 showed LGSIL and possible HGSIL. Biopsies showed LGSIL and HGSIL.   Pap 04/29/16 negative, HR HPV positive.  Pap 11/18/16 ASCUS:Pos HR HPV; 01/04/17 Biopsy showing VAIN II  Had labs with her PCP, Dr. Kelton Pillar.  Vit D 27.1.  Is now taking vit D 50,000 weekly for 6 months.  Tchol 205, HDL 81, LDL 98, TH 132, glucose 85.  Still has some left lower quadrant discomfort.   Will do mammogram and BMD this year.   Now is a grandmother.   GYNECOLOGIC HISTORY: Patient's last menstrual period was 02/02/2011. Contraception: Post menopausa/Hysterectomy Menopausal hormone therapy:  Premarin Last mammogram:  01/27/17 BIRADS 1 negative/density b Last pap smear: 11/18/16 ASCUS:Pos HR HPV; 01/04/17 Biopsy showing VAIN II, 04-29-16 Neg:Pos HR HPB,Neg 16/18/45        OB History    Gravida  2   Para  2   Term  2   Preterm      AB      Living  2     SAB      TAB      Ectopic      Multiple      Live Births  2              Patient Active Problem List   Diagnosis Date Noted  . VAIN II (vaginal intraepithelial neoplasia grade II) 05/20/2015  .  Synovial cyst of lumbar facet joint 02/05/2015  . History of abnormal Pap smear 12/30/2012  . Hypertension   . GERD (gastroesophageal reflux disease)     Past Medical History:  Diagnosis Date  . Allergy   . Anxiety    Through divorce  . Arthritis   . ASCUS with positive high risk HPV 12/2006   Negative Colpo BX  . ASCUS with positive high risk HPV 2009  . Chronic kidney disease    kidney stones  . CIN III (cervical intraepithelial neoplasia III) 07/1983   tx'd w/cryo- no subsequent abnormal paps until 2008  . Essential hypertension   . Fibroid 2006   3 cm Right Fundal Fibroid  . GERD (gastroesophageal reflux disease) 2011  . History of kidney stones   . Hypertension   . Synovial cyst of lumbar spine 10/2014  . Vitamin D deficiency     Past Surgical History:  Procedure Laterality Date  . ABDOMINAL HYSTERECTOMY  02/23/11   R TLH-fibroids, adenomyosis 221 g, & focal CIN I w/clear margins  . ANTERIOR CERVICAL DECOMP/DISCECTOMY FUSION N/A 07/19/2012   Procedure: ANTERIOR CERVICAL DECOMPRESSION/DISCECTOMY FUSION 2 LEVELS;  Surgeon: Erline Levine, MD;  Location: [facility] [facility];  Service: Neurosurgery;  Laterality: N/A;  Anterior Cervical Five-Six Cervical Six-Seven Anterior Cervical Decompression/Diskectomy/Fusion  . COLONOSCOPY    . CYSTOSCOPY  02/23/2011   PT.STATES SHE DID NOT HAVE  . LASIK  '99  . LUMBAR LAMINECTOMY/DECOMPRESSION MICRODISCECTOMY Right 02/05/2015   Procedure: Right Lumbar four-five Laminectomy with Resection of Synovial Cyst ;  Surgeon: Erline Levine, MD;  Location: [facility] [facility];  Service: Neurosurgery;  Laterality: Right;  . TONSILLECTOMY AND ADENOIDECTOMY    . VAGINAL DELIVERY  '92 '94    Current Outpatient Medications  Medication Sig Dispense Refill  . Calcium Carbonate-Vitamin D (CALTRATE 600+D) 600-400 MG-UNIT per tablet Take 1 tablet by mouth daily.      . Cholecalciferol (VITAMIN D3) 50000 units CAPS Take 1 capsule by mouth every 7 (seven) days.    Marland Kitchen  estrogens, conjugated, (PREMARIN) 0.625 MG tablet Take one tablet 0.625 mg by mouth daily. 90 tablet 3  . hydrochlorothiazide (MICROZIDE) 12.5 MG capsule Take 12.5 mg by mouth daily.     Marland Kitchen ibuprofen (ADVIL,MOTRIN) 200 MG tablet Take 600 mg by mouth every 6 (six) hours as needed.    . metoprolol succinate (TOPROL-XL) 25 MG 24 hr tablet Take 25 mg by mouth daily.    . Multiple Vitamins-Minerals (MULTIVITAMINS THER. W/MINERALS) TABS Take 1 tablet by mouth daily.       No current facility-administered medications for this visit.      ALLERGIES: Codeine and Ultram [tramadol]  Family History  Problem Relation Age of Onset  . Hypertension Mother   . Heart disease Mother   . Cancer Mother        lung  . Hypertension Father   . Heart attack Father   . Colon cancer Neg Hx   . Esophageal cancer Neg Hx   . Pancreatic cancer Neg Hx   . Prostate cancer Neg Hx   . Rectal cancer Neg Hx   . Stomach cancer Neg Hx     Social History   Socioeconomic History  . Marital status: Divorced    Spouse name: Not on file  . Number of children: Not on file  . Years of education: Not on file  . Highest education level: Not on file  Occupational History  . Not on file  Social Needs  . Financial resource strain: Not on file  . Food insecurity:    Worry: Not on file    Inability: Not on file  . Transportation needs:    Medical: Not on file    Non-medical: Not on file  Tobacco Use  . Smoking status: Never Smoker  . Smokeless tobacco: Never Used  Substance and Sexual Activity  . Alcohol use: Yes    Alcohol/week: 10.0 - 12.0 standard drinks    Types: 10 - 12 Glasses of wine per week    Comment: 10-12 glasses of wine a week  . Drug use: No  . Sexual activity: Never    Partners: Male    Birth control/protection: Post-menopausal, Surgical    Comment: R-TLH  Lifestyle  . Physical activity:    Days per week: Not on file    Minutes per session: Not on file  . Stress: Not on file  Relationships   . Social connections:    Talks on phone: Not on file    Gets together: Not on file    Attends religious service: Not on file    Active member of club or organization: Not on file    Attends meetings of clubs or  organizations: Not on file    Relationship status: Not on file  . Intimate partner violence:    Fear of current or ex partner: Not on file    Emotionally abused: Not on file    Physically abused: Not on file    Forced sexual activity: Not on file  Other Topics Concern  . Not on file  Social History Narrative  . Not on file    Review of Systems  All other systems reviewed and are negative.   PHYSICAL EXAMINATION:    BP 124/64   Pulse 70   Wt 135 lb 6.4 oz (61.4 kg)   LMP 02/02/2011   BMI 21.53 kg/m     General appearance: alert, cooperative and appears stated age     Pelvic: External genitalia:  no lesions              Urethra:  normal appearing urethra with no masses, tenderness or lesions              Bartholins and Skenes: normal                 Vagina: normal appearing vagina with normal color and discharge, no lesions              Cervix:  absent                Bimanual Exam:  Uterus:   Absent.  No abnormal texture palpable in the vagina.               Adnexa: no mass, fullness, tenderness          Chaperone was present for exam.  ASSESSMENT  Status post robotic TLH.  Tubes and ovaries remain.  Hx CIN III.  Long standing VAIN II.  Not treated.  Oncology recommending pap and HR HPV yearly.  ERT.  PLAN  Pap and HR HPV.  I discussed the possible need for colposcopy and biopsy. Fu in one year for annual exam.    An After Visit Summary was printed and given to the patient.  ___15___ minutes face to face time of which over 50% was spent in counseling.

## 2017-12-17 ENCOUNTER — Other Ambulatory Visit: Payer: Self-pay | Admitting: Family Medicine

## 2017-12-17 ENCOUNTER — Other Ambulatory Visit: Payer: Self-pay | Admitting: Obstetrics and Gynecology

## 2017-12-17 DIAGNOSIS — Z1231 Encounter for screening mammogram for malignant neoplasm of breast: Secondary | ICD-10-CM

## 2017-12-20 LAB — CYTOLOGY - PAP: HPV: DETECTED — AB

## 2017-12-23 ENCOUNTER — Encounter: Payer: Self-pay | Admitting: Gynecologic Oncology

## 2017-12-29 ENCOUNTER — Encounter: Payer: Self-pay | Admitting: Gynecologic Oncology

## 2018-01-10 ENCOUNTER — Telehealth: Payer: Self-pay | Admitting: *Deleted

## 2018-01-10 MED FILL — HYDROCHLOROTHIAZIDE 12.5 MG: 12.5 | 90 days supply | Qty: 90 | Fill #4

## 2018-01-10 MED FILL — METOPROLOL SUCCINATE ER 25: 25 | 90 days supply | Qty: 90 | Fill #1

## 2018-01-10 MED FILL — VIT D2 1.25 MG (50,000 UNIT: 1.25 MG | 84 days supply | Qty: 12 | Fill #1

## 2018-01-10 NOTE — Telephone Encounter (Signed)
Follow-up call to patient. Calling to discuss recommendation for colposcopy. Per ROI, can leave message on voice mail which has name confirmation. Left message to call back.

## 2018-01-11 NOTE — Telephone Encounter (Signed)
Patient returning call.

## 2018-01-11 NOTE — Telephone Encounter (Signed)
Return call to patient. Left message to call back. 

## 2018-01-12 NOTE — Telephone Encounter (Signed)
Call from patient. Reviewed recommendations for colposcopy.  Advised Dr Quincy Simmonds aware/supports she has reviewed results with Dr Denman George.  Offered office visit to discuss results and rationale for colpo, make mutual plan of care.  Patient states she is considering colpo but she would have this with Dr Denman George at beginning of 2020 with new insurance plan. She appreciated follow-up call and assures Dr Quincy Simmonds she follow-up.   Routing to Dr Quincy Simmonds for review.   Recall entered for 03-18-18

## 2018-01-12 NOTE — Telephone Encounter (Signed)
Call to patient. Left message to call back.  

## 2018-01-12 NOTE — Telephone Encounter (Signed)
I have reviewed the call, and you may close the encounter.  Thank you for doing the recall for January 2020.

## 2018-02-16 DIAGNOSIS — M8580 Other specified disorders of bone density and structure, unspecified site: Secondary | ICD-10-CM

## 2018-02-16 HISTORY — DX: Other specified disorders of bone density and structure, unspecified site: M85.80

## 2018-02-23 ENCOUNTER — Other Ambulatory Visit: Payer: Self-pay

## 2018-02-23 ENCOUNTER — Ambulatory Visit: Payer: Self-pay

## 2018-03-21 MED FILL — VIT D2 1.25 MG (50,000 UNIT: 1.25 MG | 56 days supply | Qty: 8 | Fill #2

## 2018-03-21 MED FILL — PREMARIN 0.625 MG TABLET: 0.625 | 90 days supply | Qty: 90 | Fill #3

## 2018-03-23 ENCOUNTER — Ambulatory Visit
Admission: RE | Admit: 2018-03-23 | Discharge: 2018-03-23 | Disposition: A | Discharge status: Home / Self Care | Payer: No Typology Code available for payment source | Source: Ambulatory Visit | Attending: Obstetrics and Gynecology | Admitting: Obstetrics and Gynecology

## 2018-03-23 DIAGNOSIS — Z1231 Encounter for screening mammogram for malignant neoplasm of breast: Secondary | ICD-10-CM

## 2018-04-13 ENCOUNTER — Ambulatory Visit
Admission: RE | Admit: 2018-04-13 | Discharge: 2018-04-13 | Disposition: A | Discharge status: Home / Self Care | Payer: No Typology Code available for payment source | Source: Ambulatory Visit | Attending: Family Medicine | Admitting: Family Medicine

## 2018-04-13 DIAGNOSIS — M8580 Other specified disorders of bone density and structure, unspecified site: Secondary | ICD-10-CM

## 2018-04-17 ENCOUNTER — Encounter: Payer: Self-pay | Admitting: Obstetrics and Gynecology

## 2018-04-25 MED FILL — HYDROCHLOROTHIAZIDE 12.5 MG: 12.5 | 90 days supply | Qty: 90 | Fill #0

## 2018-04-25 MED FILL — METOPROLOL SUCCINATE ER 25: 25 | 90 days supply | Qty: 90 | Fill #2

## 2018-06-08 ENCOUNTER — Other Ambulatory Visit: Payer: Self-pay | Admitting: Obstetrics and Gynecology

## 2018-06-08 NOTE — Telephone Encounter (Signed)
Medication refill request: premarin 0.625mg  tablet Last AEX:  05-05-2017 Next AEX: 12-21-2018 Last MMG (if hormonal medication request): 03-23-2018 category b density birads 1:neg Refill authorized: hx of VAINII

## 2018-06-17 MED FILL — VIT D2 1.25 MG (50,000 UNIT: 1.25 MG | 84 days supply | Qty: 12 | Fill #0

## 2018-06-17 MED FILL — PREMARIN 0.625 MG TABLET: 0.625 | 90 days supply | Qty: 90 | Fill #0

## 2018-07-28 ENCOUNTER — Telehealth: Payer: Self-pay | Admitting: *Deleted

## 2018-07-28 NOTE — Telephone Encounter (Signed)
Call to patient. Per ROI, can leave message- voice mail has name confirmation. Left message to call back.  Follow-up to recommended colpo.  Previous phone message patient stated she would follow-up with Dr Denman George in January 2020 with new insurance plan year.  No follow-up visits with Dr Denman George or our office regarding abnormal pap from 12-15-2017.  Reviewed with Per Dr Quincy Simmonds- recommends proceed with annual exam including pap and co-test.  Move up from currently scheduled December 21, 2018. Last annual was 05-05-2017.

## 2018-08-01 MED FILL — METOPROLOL SUCCINATE ER 25: 25 | 90 days supply | Qty: 90 | Fill #0

## 2018-08-01 MED FILL — HYDROCHLOROTHIAZIDE 12.5 MG: 12.5 | 90 days supply | Qty: 90 | Fill #1

## 2018-08-03 NOTE — Telephone Encounter (Signed)
Call from patient. States she has contacted Dr Serita Grit office and has scheduled colpo for 08-26-18. Appointment with Dr Denman George confirmed in [place] for follow-up of VAIN, pap and pelvic exam.  Routing to Dr Quincy Simmonds for review. Recall updated to 09-15-18.

## 2018-08-03 NOTE — Telephone Encounter (Signed)
Encounter reviewed and closed.  Great that she has colposcopy scheduled.

## 2018-08-26 ENCOUNTER — Encounter: Payer: Self-pay | Admitting: Gynecologic Oncology

## 2018-08-26 ENCOUNTER — Other Ambulatory Visit: Payer: Self-pay

## 2018-08-26 ENCOUNTER — Inpatient Hospital Stay: Payer: PPO | Attending: Gynecologic Oncology | Admitting: Gynecologic Oncology

## 2018-08-26 VITALS — BP 149/88 | HR 71 | Temp 97.8°F | Resp 18 | Ht 66.5 in | Wt 140.2 lb

## 2018-08-26 DIAGNOSIS — I129 Hypertensive chronic kidney disease with stage 1 through stage 4 chronic kidney disease, or unspecified chronic kidney disease: Secondary | ICD-10-CM | POA: Insufficient documentation

## 2018-08-26 DIAGNOSIS — R87810 Cervical high risk human papillomavirus (HPV) DNA test positive: Secondary | ICD-10-CM

## 2018-08-26 DIAGNOSIS — N189 Chronic kidney disease, unspecified: Secondary | ICD-10-CM | POA: Diagnosis not present

## 2018-08-26 DIAGNOSIS — N891 Moderate vaginal dysplasia: Secondary | ICD-10-CM

## 2018-08-26 DIAGNOSIS — E559 Vitamin D deficiency, unspecified: Secondary | ICD-10-CM | POA: Insufficient documentation

## 2018-08-26 DIAGNOSIS — Z90710 Acquired absence of both cervix and uterus: Secondary | ICD-10-CM | POA: Insufficient documentation

## 2018-08-26 DIAGNOSIS — K219 Gastro-esophageal reflux disease without esophagitis: Secondary | ICD-10-CM | POA: Insufficient documentation

## 2018-08-26 DIAGNOSIS — M8580 Other specified disorders of bone density and structure, unspecified site: Secondary | ICD-10-CM | POA: Diagnosis not present

## 2018-08-26 NOTE — Patient Instructions (Signed)
Dr Denman George took a biopsy of the left vaginal wall today and this will be resulted next week. Her office will contact you with results. Provided this remains VAIN 2 or 1, this can continue to be observed. If it is VAIN 3, she may recommend a trial of 5FU.  It is reasonable to forgo treatment of persistent low grade pap smear changes provided you are enrolled in close surveillance.

## 2018-08-26 NOTE — Progress Notes (Signed)
Follow-up Note: Gyn-Onc  Consult was initially requested by Dr. Quincy Simmonds for the evaluation of Debra Nguyen 63 y.o. female with ASCUS, High risk HPV.  CC:  Chief Complaint  Patient presents with  . vaginal dysplasia    Assessment/Plan:  Debra Nguyen  is a 63 y.o.  year old with persistent, chronic vaginal moderate to high-grade dysplasia and persistent VAINII. Recent pap with ASCUS and high risk HPV. No high grade changes seen on today's exam.  Will follow-up the results of today's biopsy. If VAIN I or II we will plan for close surveillance with repeat HPV testing and pap in 12 months.  I discussed that her persistent low grade changes will likely be present for life, given the natural history of her persistent low grade disease, I do not feel that 6 monthly evaluations are necessary, nor is therapeutic intervention should they remain stable.  If VAIN III I would recommend a trial of 5FU vaginally.   I offered 5FU therapy regardless of results, however, she declines this given that if there is low risk disease she understands that treatment is not necessary.   HPI: The patient is a very pleasant 63 year old gravida 2 para 2 who is seen in consultation at the request of Dr. Quincy Simmonds for persistent high risk HPV and moderate vaginal dysplasia. Patient has a 35 year history of initially cervical, and then vaginal dysplasia. She's had multiple excisional or ablative procedures throughout the years including cryotherapy remotely and a hysterectomy performed in 2013 for symptomatic uterine fibroids in the setting of low-grade cervical dysplasia. She developed vaginal dysplasia post-hysterectomy with a Pap smear in February 2016 showing low-grade squamous intraepithelial lesion and positive for high-risk HPV, and colposcopy in March 2016 revealed moderate grade dysplasia in the left vaginal vault in 2 very small foci one of which was biopsied to remove it and demonstrated VAIN II.  The  patient has no concerning symptoms including no bleeding or discharge from the vagina, no lower extremity edema, no back pain, no dyspareunia. She had a pap in September 2016 which showed LSIL with positive hrHPV. She also had colpo in September 2016 with Dr Quincy Simmonds which showed persistent area at the left vaginal cuff that appeared abnormal and was biopsied at 2 sites. One site showed VAIN I and the other showed VAIN II. Dr Quincy Simmonds had recommended laser.  The patient had a pap smear on 04/17/15 with Dr Quincy Simmonds which showed LGSIL, with hr HPV detected. Biopsy on 05/20/15 showed koilocytic atypia consistent with HPV.  She went on to have repeat pap testing with HPV testing on 11/18/16 which showed ASCUS with positive high risk HPV.  Interval History:  Biopsy on 01/04/17 with me in the office showed VAIN II. This was expectantly managed with the acknowledgement that she would likely have persistent changes and may progress to high grade.  Repeat pap on 12/15/17 showed ASC-US with positive high risk HPV detected. Dr Quincy Simmonds recommended colposcopy and biopsies but the patient declined at that time due to knowledge that she has persistent but stable low grade dysplasia. After strong encouragement by Dr Quincy Simmonds to follow-up she presents to me today for follow-up.  Reports no symptoms concerning for occult vaginal cancer including no vaginal bleeding or abnormal discharge, no pelvic pain, no lower extremity edema.  Current Meds:  Outpatient Encounter Medications as of 08/26/2018  Medication Sig  . Calcium Carbonate-Vitamin D (CALTRATE 600+D) 600-400 MG-UNIT per tablet Take 1 tablet by mouth daily.    Marland Kitchen  Cholecalciferol (VITAMIN D3) 50000 units CAPS Take 1 capsule by mouth every 7 (seven) days.  Marland Kitchen estrogens, conjugated, (PREMARIN) 0.625 MG tablet TAKE ONE TABLET BY MOUTH DAILY.  . hydrochlorothiazide (MICROZIDE) 12.5 MG capsule Take 12.5 mg by mouth daily.   Marland Kitchen ibuprofen (ADVIL,MOTRIN) 200 MG tablet Take 600 mg by mouth  every 6 (six) hours as needed.  . metoprolol succinate (TOPROL-XL) 25 MG 24 hr tablet Take 25 mg by mouth daily.  . Multiple Vitamins-Minerals (MULTIVITAMINS THER. W/MINERALS) TABS Take 1 tablet by mouth daily.     No facility-administered encounter medications on file as of 08/26/2018.     Allergy:  Allergies  Allergen Reactions  . Codeine Itching  . Ultram [Tramadol] Itching    Social Hx:   Social History   Socioeconomic History  . Marital status: Divorced    Spouse name: Not on file  . Number of children: Not on file  . Years of education: Not on file  . Highest education level: Not on file  Occupational History  . Not on file  Social Needs  . Financial resource strain: Not on file  . Food insecurity    Worry: Not on file    Inability: Not on file  . Transportation needs    Medical: Not on file    Non-medical: Not on file  Tobacco Use  . Smoking status: Never Smoker  . Smokeless tobacco: Never Used  Substance and Sexual Activity  . Alcohol use: Yes    Alcohol/week: 10.0 - 12.0 standard drinks    Types: 10 - 12 Glasses of wine per week    Comment: 10-12 glasses of wine a week  . Drug use: No  . Sexual activity: Never    Partners: Male    Birth control/protection: Post-menopausal, Surgical    Comment: R-TLH  Lifestyle  . Physical activity    Days per week: Not on file    Minutes per session: Not on file  . Stress: Not on file  Relationships  . Social Herbalist on phone: Not on file    Gets together: Not on file    Attends religious service: Not on file    Active member of club or organization: Not on file    Attends meetings of clubs or organizations: Not on file    Relationship status: Not on file  . Intimate partner violence    Fear of current or ex partner: Not on file    Emotionally abused: Not on file    Physically abused: Not on file    Forced sexual activity: Not on file  Other Topics Concern  . Not on file  Social History Narrative   . Not on file    Past Surgical Hx:  Past Surgical History:  Procedure Laterality Date  . ABDOMINAL HYSTERECTOMY  02/23/11   R TLH-fibroids, adenomyosis 221 g, & focal CIN I w/clear margins  . ANTERIOR CERVICAL DECOMP/DISCECTOMY FUSION N/A 07/19/2012   Procedure: ANTERIOR CERVICAL DECOMPRESSION/DISCECTOMY FUSION 2 LEVELS;  Surgeon: Erline Levine, MD;  Location: [facility];  Service: Neurosurgery;  Laterality: N/A;  Anterior Cervical Five-Six Cervical Six-Seven Anterior Cervical Decompression/Diskectomy/Fusion  . COLONOSCOPY    . CYSTOSCOPY  02/23/2011   PT.STATES SHE DID NOT HAVE  . LASIK  '99  . LUMBAR LAMINECTOMY/DECOMPRESSION MICRODISCECTOMY Right 02/05/2015   Procedure: Right Lumbar four-five Laminectomy with Resection of Synovial Cyst ;  Surgeon: Erline Levine, MD;  Location: [facility];  Service: Neurosurgery;  Laterality: Right;  .  TONSILLECTOMY AND ADENOIDECTOMY    . VAGINAL DELIVERY  '92 '94    Past Medical Hx:  Past Medical History:  Diagnosis Date  . Allergy   . Anxiety    Through divorce  . Arthritis   . ASCUS with positive high risk HPV 12/2006   Negative Colpo BX  . ASCUS with positive high risk HPV 2009  . Chronic kidney disease    kidney stones  . CIN III (cervical intraepithelial neoplasia III) 07/1983   tx'd w/cryo- no subsequent abnormal paps until 2008  . Essential hypertension   . Fibroid 2006   3 cm Right Fundal Fibroid  . GERD (gastroesophageal reflux disease) 2011  . History of kidney stones   . Hypertension   . Osteopenia 2020  . Synovial cyst of lumbar spine 10/2014  . Vitamin D deficiency     Past Gynecological History:  SVD x 2  Patient's last menstrual period was 02/02/2011.  Family Hx:  Family History  Problem Relation Age of Onset  . Hypertension Mother   . Heart disease Mother   . Cancer Mother        lung  . Hypertension Father   . Heart attack Father   . Colon cancer Neg Hx   . Esophageal cancer Neg Hx   . Pancreatic cancer Neg Hx    . Prostate cancer Neg Hx   . Rectal cancer Neg Hx   . Stomach cancer Neg Hx     Review of Systems:  Constitutional  Feels well,    ENT Normal appearing ears and nares bilaterally Skin/Breast  No rash, sores, jaundice, itching, dryness Cardiovascular  No chest pain, shortness of breath, or edema  Pulmonary  No cough or wheeze.  Gastro Intestinal  No nausea, vomitting, or diarrhoea. No bright red blood per rectum, no abdominal pain, change in bowel movement, or constipation.  Genito Urinary  No frequency, urgency, dysuria, see above Musculo Skeletal  No myalgia, arthralgia, joint swelling or pain  Neurologic  No weakness, numbness, change in gait,  Psychology  No depression, anxiety, insomnia.   Vitals:  Blood pressure (!) 149/88, pulse 71, temperature 97.8 F (36.6 C), temperature source Oral, resp. rate 18, height 5' 6.5" (1.689 m), weight 140 lb 3.2 oz (63.6 kg), last menstrual period 02/02/2011, SpO2 100 %.  Physical Exam: Lymph node survey: no suspicious inguinal nodes Abdomen: soft, nondistended.  Pelvic: evaluation performed of entire vagina with 4% acetic acid. No gross areas of dysplasia noted, eval of left fornix took place due to tethering, a biopsy of the left fornix took place with forceps where there were acetowhite changes. Hemostasis obtained with silver nitrate. No gross malignancy was noted  Debra Solo, MD   08/26/2018, 12:38 PM

## 2018-08-29 ENCOUNTER — Telehealth: Payer: Self-pay

## 2018-08-29 NOTE — Telephone Encounter (Signed)
There is no dysplasia in the biopsy.  Recommend repeat pap in November with Dr Quincy Simmonds.  Thanks  Terrence Dupont

## 2018-08-29 NOTE — Telephone Encounter (Signed)
LM for Debra Nguyen to call back to discuss the results of the biopsy from 08-26-18

## 2018-08-29 NOTE — Telephone Encounter (Signed)
Told Ms Dysart the results of the biopsy as noted below by Dr. Denman George.

## 2018-09-13 MED FILL — PREMARIN 0.625 MG TABLET: 0.625 | 90 days supply | Qty: 90 | Fill #0

## 2018-09-13 MED FILL — traMADol HCL 50 MG TABS: 50 | 7 days supply | Qty: 30 | Fill #0

## 2018-09-13 MED FILL — CYCLOBENZAPRINE 10 MG TAB: 10 | 20 days supply | Qty: 60 | Fill #0

## 2018-09-19 DIAGNOSIS — E559 Vitamin D deficiency, unspecified: Secondary | ICD-10-CM | POA: Diagnosis not present

## 2018-09-21 DIAGNOSIS — R109 Unspecified abdominal pain: Secondary | ICD-10-CM | POA: Diagnosis not present

## 2018-10-04 ENCOUNTER — Telehealth: Payer: Self-pay | Admitting: Gastroenterology

## 2018-10-04 NOTE — Telephone Encounter (Signed)
FYI- Patient has not been seen in the office. Colonoscopy with Nandigam 11/09/2016. Patient reports completed a "stool test" for her PCP. Results positive for blood. PCP encouraged patient to reach out to GI to see if she possibly needed a colonoscopy. Patient reports no acute problems. No bloody or tarry stools. Transient abd pain that PCP think could be musculoskeletal related or due to a past laparoscopic hysterectomy (adhesions). Patient scheduled for a f/u visit with you on 9/24 at 8:30 am. Offered for patient to be seen sooner by an APP, patient declined, preferred to be seen by you. Will attempt to obtain lab results from [facility].

## 2018-10-04 NOTE — Telephone Encounter (Signed)
[employer], [phone] stool results faxed to the ATTN: [email or web address].

## 2018-10-06 NOTE — Telephone Encounter (Signed)
Ok, follow up in office visit as scheduled. Thanks

## 2018-10-06 NOTE — Telephone Encounter (Signed)
Debra Nguyen called [facility] again today. The FITT test is being faxed over now.

## 2018-10-26 MED FILL — METOPROLOL SUCCINATE ER 25: 25 | 90 days supply | Qty: 90 | Fill #1

## 2018-10-26 MED FILL — HYDROCHLOROTHIAZIDE 12.5 MG: 12.5 | 90 days supply | Qty: 90 | Fill #0

## 2018-11-10 ENCOUNTER — Ambulatory Visit (INDEPENDENT_AMBULATORY_CARE_PROVIDER_SITE_OTHER): Payer: PPO | Admitting: Gastroenterology

## 2018-11-10 ENCOUNTER — Encounter: Payer: Self-pay | Admitting: Gastroenterology

## 2018-11-10 ENCOUNTER — Other Ambulatory Visit: Payer: Self-pay

## 2018-11-10 VITALS — BP 162/78 | HR 80 | Temp 97.6°F | Ht 66.5 in | Wt 141.6 lb

## 2018-11-10 DIAGNOSIS — R195 Other fecal abnormalities: Secondary | ICD-10-CM

## 2018-11-10 DIAGNOSIS — R1032 Left lower quadrant pain: Secondary | ICD-10-CM | POA: Diagnosis not present

## 2018-11-10 DIAGNOSIS — K588 Other irritable bowel syndrome: Secondary | ICD-10-CM | POA: Diagnosis not present

## 2018-11-10 NOTE — Progress Notes (Signed)
Debra Nguyen    [id number]    February 23, 1955  Primary Care Physician:Griffin, Margaretha Sheffield, MD  Referring Physician: Kelton Pillar, MD [place],  [place] [address]   Chief complaint: Fecal Hemoccult positive HPI:  63 year old female here for follow-up visit for evaluation of fecal Hemoccult positive She had an episode of severe left lower quadrant abdominal pain, improved since she started taking Flexeril for possible musculoskeletal pain. Denies any melena or bright red blood per rectum When left lower quadrant abdominal pain was worse, she was taking ibuprofen daily.  On stool test, fecal Hemoccult was positive. Denies any nausea, vomiting, abdominal pain, melena or bright red blood per rectum Overall she feels well.  No change in appetite, bowel habits or weight.  Colonoscopy November 09, 2016: 2 mm adenomatous polyp removed from cecum, hypertrophied anal papilla otherwise unremarkable exam  Outpatient Encounter Medications as of 11/10/2018  Medication Sig  . Calcium Carbonate-Vitamin D (CALTRATE 600+D) 600-400 MG-UNIT per tablet Take 1 tablet by mouth daily.    . cyclobenzaprine (FLEXERIL) 10 MG tablet Take 10 mg by mouth 3 (three) times daily as needed for muscle spasms.  Marland Kitchen estrogens, conjugated, (PREMARIN) 0.625 MG tablet TAKE ONE TABLET BY MOUTH DAILY.  . hydrochlorothiazide (MICROZIDE) 12.5 MG capsule Take 12.5 mg by mouth daily.   Marland Kitchen ibuprofen (ADVIL,MOTRIN) 200 MG tablet Take 600 mg by mouth every 6 (six) hours as needed.  . metoprolol succinate (TOPROL-XL) 25 MG 24 hr tablet Take 25 mg by mouth daily.  . Multiple Vitamins-Minerals (MULTIVITAMINS THER. W/MINERALS) TABS Take 1 tablet by mouth daily.    . [DISCONTINUED] Cholecalciferol (VITAMIN D3) 50000 units CAPS Take 1 capsule by mouth every 7 (seven) days.   No facility-administered encounter medications on file as of 11/10/2018.     Allergies as of 11/10/2018 - Review Complete  11/10/2018  Allergen Reaction Noted  . Codeine Itching 02/02/2011  . Ultram [tramadol] Itching 12/27/2012    Past Medical History:  Diagnosis Date  . Allergy   . Anxiety    Through divorce  . Arthritis   . ASCUS with positive high risk HPV 12/2006   Negative Colpo BX  . ASCUS with positive high risk HPV 2009  . Chronic kidney disease    kidney stones  . CIN III (cervical intraepithelial neoplasia III) 07/1983   tx'd w/cryo- no subsequent abnormal paps until 2008  . Essential hypertension   . Fibroid 2006   3 cm Right Fundal Fibroid  . GERD (gastroesophageal reflux disease) 2011  . History of kidney stones   . Hypertension   . Osteopenia 2020  . Synovial cyst of lumbar spine 10/2014  . Vitamin D deficiency     Past Surgical History:  Procedure Laterality Date  . ABDOMINAL HYSTERECTOMY  02/23/11   R TLH-fibroids, adenomyosis 221 g, & focal CIN I w/clear margins  . ANTERIOR CERVICAL DECOMP/DISCECTOMY FUSION N/A 07/19/2012   Procedure: ANTERIOR CERVICAL DECOMPRESSION/DISCECTOMY FUSION 2 LEVELS;  Surgeon: Erline Levine, MD;  Location: [facility];  Service: Neurosurgery;  Laterality: N/A;  Anterior Cervical Five-Six Cervical Six-Seven Anterior Cervical Decompression/Diskectomy/Fusion  . COLONOSCOPY    . CYSTOSCOPY  02/23/2011   PT.STATES SHE DID NOT HAVE  . LASIK  '99  . LUMBAR LAMINECTOMY/DECOMPRESSION MICRODISCECTOMY Right 02/05/2015   Procedure: Right Lumbar four-five Laminectomy with Resection of Synovial Cyst ;  Surgeon: Erline Levine, MD;  Location: [facility];  Service: Neurosurgery;  Laterality: Right;  . TONSILLECTOMY AND ADENOIDECTOMY    . VAGINAL DELIVERY  '92 '94    Family History  Problem Relation Age of Onset  . Hypertension Mother   . Heart disease Mother   . Cancer Mother        lung  . Hypertension Father   . Heart attack Father   . Colon cancer Neg Hx   . Esophageal cancer Neg Hx   . Pancreatic cancer Neg Hx   . Prostate cancer Neg Hx   . Rectal cancer  Neg Hx   . Stomach cancer Neg Hx     Social History   Socioeconomic History  . Marital status: Divorced    Spouse name: Not on file  . Number of children: Not on file  . Years of education: Not on file  . Highest education level: Not on file  Occupational History  . Not on file  Social Needs  . Financial resource strain: Not on file  . Food insecurity    Worry: Not on file    Inability: Not on file  . Transportation needs    Medical: Not on file    Non-medical: Not on file  Tobacco Use  . Smoking status: Never Smoker  . Smokeless tobacco: Never Used  Substance and Sexual Activity  . Alcohol use: Yes    Alcohol/week: 10.0 - 12.0 standard drinks    Types: 10 - 12 Glasses of wine per week    Comment: 10-12 glasses of wine a week  . Drug use: No  . Sexual activity: Never    Partners: Male    Birth control/protection: Post-menopausal, Surgical    Comment: R-TLH  Lifestyle  . Physical activity    Days per week: Not on file    Minutes per session: Not on file  . Stress: Not on file  Relationships  . Social Herbalist on phone: Not on file    Gets together: Not on file    Attends religious service: Not on file    Active member of club or organization: Not on file    Attends meetings of clubs or organizations: Not on file    Relationship status: Not on file  . Intimate partner violence    Fear of current or ex partner: Not on file    Emotionally abused: Not on file    Physically abused: Not on file    Forced sexual activity: Not on file  Other Topics Concern  . Not on file  Social History Narrative  . Not on file      Review of systems: Review of Systems  Constitutional: Negative for fever and chills.  HENT: Negative.   Eyes: Negative for blurred vision.  Respiratory: Negative for cough, shortness of breath and wheezing.   Cardiovascular: Negative for chest pain and palpitations.  Gastrointestinal: as per HPI Genitourinary: Negative for dysuria,  urgency, frequency and hematuria.  Musculoskeletal: Negative for myalgias, back pain and joint pain.  Skin: Negative for itching and rash.  Neurological: Negative for dizziness, tremors, focal weakness, seizures and loss of consciousness.  Endo/Heme/Allergies: Positive for seasonal allergies.  Psychiatric/Behavioral: Negative for depression, suicidal ideas and hallucinations.  All other systems reviewed and are negative.   Physical Exam: Vitals:   11/10/18 0833  BP: (!) 162/78  Pulse: 80  Temp: 97.6 F (36.4 C)   Body mass index is 22.51 kg/m. Gen:      No acute distress HEENT:  EOMI, sclera anicteric  Neck:     No masses; no thyromegaly Lungs:    Clear to auscultation bilaterally; normal respiratory effort CV:         Regular rate and rhythm; no murmurs Abd:      + bowel sounds; soft, non-tender; no palpable masses, no distension Ext:    No edema; adequate peripheral perfusion Skin:      Warm and dry; no rash Neuro: alert and oriented x 3 Psych: normal mood and affect  Data Reviewed:  Reviewed labs, radiology imaging, old records and pertinent past GI work up   Assessment and Plan/Recommendations:  63 year old female with history of chronic irritable bowel syndrome, left lower quadrant discomfort here for evaluation of positive fecal Hemoccult. No overt GI bleeding She is up-to-date with colorectal cancer screening Never had EGD, will schedule EGD to exclude any high risk upper GI lesions but most likely etiology could be gastritis from frequent NSAID use Avoid NSAIDs  Benefiber teaspoon 3 times daily to improve bloating and IBS symptoms  Return as needed  The risks and benefits as well as alternatives of endoscopic procedure(s) have been discussed and reviewed. All questions answered. The patient agrees to proceed.   Damaris Hippo , MD    CC: Kelton Pillar, MD

## 2018-11-10 NOTE — Patient Instructions (Signed)
You have been scheduled for an endoscopy. Please follow written instructions given to you at your visit today. If you use inhalers (even only as needed), please bring them with you on the day of your procedure.   Take benefiber 1 tsp three times a day   I appreciate the  opportunity to care for you  Thank You   Harl Bowie , MD

## 2018-11-11 ENCOUNTER — Encounter: Payer: Self-pay | Admitting: Gastroenterology

## 2018-11-16 ENCOUNTER — Encounter: Payer: Self-pay | Admitting: Gastroenterology

## 2018-11-22 ENCOUNTER — Telehealth: Payer: Self-pay

## 2018-11-22 NOTE — Telephone Encounter (Signed)
Covid-19 screening questions   Do you now or have you had a fever in the last 14 days? NO   Do you have any respiratory symptoms of shortness of breath or cough now or in the last 14 days? NO  Do you have any family members or close contacts with diagnosed or suspected Covid-19 in the past 14 days? NO  Have you been tested for Covid-19 and found to be positive? NO        

## 2018-11-23 ENCOUNTER — Other Ambulatory Visit: Payer: Self-pay

## 2018-11-23 ENCOUNTER — Ambulatory Visit (AMBULATORY_SURGERY_CENTER): Payer: PPO | Admitting: Gastroenterology

## 2018-11-23 ENCOUNTER — Encounter: Payer: Self-pay | Admitting: Gastroenterology

## 2018-11-23 VITALS — BP 146/78 | HR 76 | Temp 98.3°F | Resp 14 | Ht 66.0 in | Wt 141.0 lb

## 2018-11-23 DIAGNOSIS — R195 Other fecal abnormalities: Secondary | ICD-10-CM | POA: Diagnosis not present

## 2018-11-23 DIAGNOSIS — K200 Eosinophilic esophagitis: Secondary | ICD-10-CM

## 2018-11-23 DIAGNOSIS — K3189 Other diseases of stomach and duodenum: Secondary | ICD-10-CM | POA: Diagnosis not present

## 2018-11-23 DIAGNOSIS — K2990 Gastroduodenitis, unspecified, without bleeding: Secondary | ICD-10-CM | POA: Diagnosis not present

## 2018-11-23 DIAGNOSIS — K2970 Gastritis, unspecified, without bleeding: Secondary | ICD-10-CM | POA: Diagnosis not present

## 2018-11-23 MED ORDER — SODIUM CHLORIDE 0.9 % IV SOLN: 500.0000 mL | Freq: Once | INTRAVENOUS | Status: DC

## 2018-11-23 MED ORDER — OMEPRAZOLE 20 MG PO CPDR: 20.0000 mg | DELAYED_RELEASE_CAPSULE | Freq: Every day | ORAL | 0 refills | Status: DC

## 2018-11-23 MED FILL — OMEPRAZOLE 20 MG CAPSULE DR: 20 | 90 days supply | Qty: 90 | Fill #0

## 2018-11-23 NOTE — Patient Instructions (Signed)
Please read handouts provided. Continue present medications. Follow anti-reflux regimen. Begin Prilosec ( omeprazole ) 20 mg daily for 3 months. No ibuprofen, naproxen, or other non-steriodal anti-inflammatory drugs. Await pathology results.       YOU HAD AN ENDOSCOPIC PROCEDURE TODAY AT [facility]:   Refer to the procedure report that was given to you for any specific questions about what was found during the examination.  If the procedure report does not answer your questions, please call your gastroenterologist to clarify.  If you requested that your care partner not be given the details of your procedure findings, then the procedure report has been included in a sealed envelope for you to review at your convenience later.  YOU SHOULD EXPECT: Some feelings of bloating in the abdomen. Passage of more gas than usual.  Walking can help get rid of the air that was put into your GI tract during the procedure and reduce the bloating. If you had a lower endoscopy (such as a colonoscopy or flexible sigmoidoscopy) you may notice spotting of blood in your stool or on the toilet paper. If you underwent a bowel prep for your procedure, you may not have a normal bowel movement for a few days.  Please Note:  You might notice some irritation and congestion in your nose or some drainage.  This is from the oxygen used during your procedure.  There is no need for concern and it should clear up in a day or so.  SYMPTOMS TO REPORT IMMEDIATELY:    Following upper endoscopy (EGD)  Vomiting of blood or coffee ground material  New chest pain or pain under the shoulder blades  Painful or persistently difficult swallowing  New shortness of breath  Fever of 100F or higher  Black, tarry-looking stools  For urgent or emergent issues, a gastroenterologist can be reached at any hour by calling [phone].   DIET:  We do recommend a small meal at first, but then you may proceed to your  regular diet.  Drink plenty of fluids but you should avoid alcoholic beverages for 24 hours.  ACTIVITY:  You should plan to take it easy for the rest of today and you should NOT DRIVE or use heavy machinery until tomorrow (because of the sedation medicines used during the test).    FOLLOW UP: Our staff will call the number listed on your records 48-72 hours following your procedure to check on you and address any questions or concerns that you may have regarding the information given to you following your procedure. If we do not reach you, we will leave a message.  We will attempt to reach you two times.  During this call, we will ask if you have developed any symptoms of COVID 19. If you develop any symptoms (ie: fever, flu-like symptoms, shortness of breath, cough etc.) before then, please call [phone].  If you test positive for Covid 19 in the 2 weeks post procedure, please call and report this information to [place].    If any biopsies were taken you will be contacted by phone or by letter within the next 1-3 weeks.  Please call us at [phone] if you have not heard about the biopsies in 3 weeks.    SIGNATURES/CONFIDENTIALITY: You and/or your care partner have signed paperwork which will be entered into your electronic medical record.  These signatures attest to the fact that that the information above on your After Visit Summary has been reviewed and is understood.  Full responsibility of the confidentiality of this discharge information lies with you and/or your care-partner.

## 2018-11-23 NOTE — Progress Notes (Signed)
Called to room to assist during endoscopic procedure.  Patient ID and intended procedure confirmed with present staff. Received instructions for my participation in the procedure from the performing physician.  

## 2018-11-23 NOTE — Op Note (Signed)
[facility] Patient Name: Foy Galey Procedure Date: 11/23/2018 10:12 AM MRN: [id number] Endoscopist: Mauri Pole , MD Age: 63 Referring MD:  Date of Birth: July 24, 1955 Gender: Female Account #: [id number] Procedure:                Upper GI endoscopy Indications:              Fecal heme occult positive. Suspected upper                            gastrointestinal bleeding, Gastrointestinal                            bleeding of unknown origin Medicines:                Monitored Anesthesia Care Procedure:                Pre-Anesthesia Assessment:                           - Prior to the procedure, a History and Physical                            was performed, and patient medications and                            allergies were reviewed. The patient's tolerance of                            previous anesthesia was also reviewed. The risks                            and benefits of the procedure and the sedation                            options and risks were discussed with the patient.                            All questions were answered, and informed consent                            was obtained. Prior Anticoagulants: The patient has                            taken no previous anticoagulant or antiplatelet                            agents. ASA Grade Assessment: II - A patient with                            mild systemic disease. After reviewing the risks                            and benefits, the patient was deemed in  satisfactory condition to undergo the procedure.                           After obtaining informed consent, the endoscope was                            passed under direct vision. Throughout the                            procedure, the patient's blood pressure, pulse, and                            oxygen saturations were monitored continuously. The                            Endoscope was introduced through the  mouth, and                            advanced to the second part of duodenum. The upper                            GI endoscopy was accomplished without difficulty.                            The patient tolerated the procedure well. Scope In: Scope Out: Findings:                 LA Grade B (one or more mucosal breaks greater than                            5 mm, not extending between the tops of two mucosal                            folds) esophagitis with no bleeding was found 34 to                            37 cm from the incisors. Biopsies were taken with a                            cold forceps for histology.                           Patchy moderate inflammation characterized by                            adherent blood, congestion (edema), erosions and                            erythema was found in the entire examined stomach.                            Biopsies were taken with a cold forceps for  Helicobacter pylori testing.                           The examined duodenum was normal. Complications:            No immediate complications. Estimated Blood Loss:     Estimated blood loss was minimal. Impression:               - LA Grade B reflux esophagitis. Biopsied.                           - Erosive gastritis. Biopsied.                           - Normal examined duodenum. Recommendation:           - Patient has a contact number available for                            emergencies. The signs and symptoms of potential                            delayed complications were discussed with the                            patient. Return to normal activities tomorrow.                            Written discharge instructions were provided to the                            patient.                           - Resume previous diet.                           - Continue present medications.                           - Use Prilosec (omeprazole) 20 mg PO daily  for 3                            months.                           - Follow an antireflux regimen.                           - No ibuprofen, naproxen, or other non-steroidal                            anti-inflammatory drugs. Mauri Pole, MD 11/23/2018 10:30:15 AM This report has been signed electronically.

## 2018-11-23 NOTE — Progress Notes (Signed)
PT taken to PACU. Monitors in place. VSS. Report given to RN. 

## 2018-11-25 ENCOUNTER — Telehealth: Payer: Self-pay | Admitting: *Deleted

## 2018-11-25 ENCOUNTER — Telehealth: Payer: Self-pay

## 2018-11-25 NOTE — Telephone Encounter (Signed)
First attempt, left VM.  

## 2018-11-25 NOTE — Telephone Encounter (Signed)
  Follow up Call-  Call back number 11/23/2018 11/09/2016  Post procedure Call Back phone  # [phone] cell  Permission to leave phone message Yes Yes  Some recent data might be hidden     Patient questions:  Do you have a fever, pain , or abdominal swelling? No. Pain Score  0 *  Have you tolerated food without any problems? Yes.    Have you been able to return to your normal activities? Yes.    Do you have any questions about your discharge instructions: Diet   No. Medications  No. Follow up visit  No.  Do you have questions or concerns about your Care? No.  Actions: * If pain score is 4 or above: No action needed, pain <4.  1. Have you developed a fever since your procedure? no  2.   Have you had an respiratory symptoms (SOB or cough) since your procedure?no  3.   Have you tested positive for COVID 19 since your procedure no  4.   Have you had any family members/close contacts diagnosed with the COVID 19 since your procedure?  no  If yes to any of these questions please route to Joylene John, RN and Alphonsa Gin, Therapist, sports.

## 2018-11-29 ENCOUNTER — Encounter: Payer: Self-pay | Admitting: Gastroenterology

## 2018-11-30 DIAGNOSIS — D692 Other nonthrombocytopenic purpura: Secondary | ICD-10-CM | POA: Diagnosis not present

## 2018-11-30 DIAGNOSIS — Z23 Encounter for immunization: Secondary | ICD-10-CM | POA: Diagnosis not present

## 2018-11-30 DIAGNOSIS — L814 Other melanin hyperpigmentation: Secondary | ICD-10-CM | POA: Diagnosis not present

## 2018-11-30 DIAGNOSIS — D225 Melanocytic nevi of trunk: Secondary | ICD-10-CM | POA: Diagnosis not present

## 2018-11-30 DIAGNOSIS — L821 Other seborrheic keratosis: Secondary | ICD-10-CM | POA: Diagnosis not present

## 2018-11-30 DIAGNOSIS — L719 Rosacea, unspecified: Secondary | ICD-10-CM | POA: Diagnosis not present

## 2018-11-30 DIAGNOSIS — L570 Actinic keratosis: Secondary | ICD-10-CM | POA: Diagnosis not present

## 2018-12-14 DIAGNOSIS — E7800 Pure hypercholesterolemia, unspecified: Secondary | ICD-10-CM | POA: Diagnosis not present

## 2018-12-14 DIAGNOSIS — Z23 Encounter for immunization: Secondary | ICD-10-CM | POA: Diagnosis not present

## 2018-12-14 DIAGNOSIS — Z1389 Encounter for screening for other disorder: Secondary | ICD-10-CM | POA: Diagnosis not present

## 2018-12-14 DIAGNOSIS — N890 Mild vaginal dysplasia: Secondary | ICD-10-CM | POA: Diagnosis not present

## 2018-12-14 DIAGNOSIS — K219 Gastro-esophageal reflux disease without esophagitis: Secondary | ICD-10-CM | POA: Diagnosis not present

## 2018-12-14 DIAGNOSIS — E559 Vitamin D deficiency, unspecified: Secondary | ICD-10-CM | POA: Diagnosis not present

## 2018-12-14 DIAGNOSIS — M545 Low back pain: Secondary | ICD-10-CM | POA: Diagnosis not present

## 2018-12-14 DIAGNOSIS — Z0000 Encounter for general adult medical examination without abnormal findings: Secondary | ICD-10-CM | POA: Diagnosis not present

## 2018-12-14 DIAGNOSIS — I10 Essential (primary) hypertension: Secondary | ICD-10-CM | POA: Diagnosis not present

## 2018-12-14 DIAGNOSIS — M8580 Other specified disorders of bone density and structure, unspecified site: Secondary | ICD-10-CM | POA: Diagnosis not present

## 2018-12-19 ENCOUNTER — Other Ambulatory Visit: Payer: Self-pay

## 2018-12-19 MED FILL — PREMARIN 0.625 MG TABLET: 0.625 | 90 days supply | Qty: 90 | Fill #1

## 2018-12-21 ENCOUNTER — Ambulatory Visit (INDEPENDENT_AMBULATORY_CARE_PROVIDER_SITE_OTHER): Payer: PPO | Admitting: Obstetrics and Gynecology

## 2018-12-21 ENCOUNTER — Telehealth: Payer: Self-pay | Admitting: Obstetrics and Gynecology

## 2018-12-21 ENCOUNTER — Other Ambulatory Visit: Payer: Self-pay

## 2018-12-21 ENCOUNTER — Encounter: Payer: Self-pay | Admitting: Obstetrics and Gynecology

## 2018-12-21 VITALS — BP 158/80 | HR 70 | Temp 97.3°F | Resp 16 | Ht 67.0 in | Wt 140.0 lb

## 2018-12-21 DIAGNOSIS — Z01419 Encounter for gynecological examination (general) (routine) without abnormal findings: Secondary | ICD-10-CM

## 2018-12-21 MED ORDER — ESTROGENS CONJUGATED 0.625 MG PO TABS: ORAL_TABLET | ORAL | 2 refills | Status: DC

## 2018-12-21 NOTE — Telephone Encounter (Signed)
Please place patient in pap recall for November, 2021.   She sees me and GYN ONC for her hx of vaginal dyplasia, which is followed and not treated.  Last vaginal biopsy in July 2021 was normal.

## 2018-12-21 NOTE — Progress Notes (Signed)
63 y.o. G32P2002 Divorced Caucasian female here for annual exam.    On ERT. She wants to continue this.   Patient had consultation with Dr. Denman George 08/26/18 for recurrent abnormal paps and hx CIN and VAIN.  Colposcopy with biopsy, benign.  She recommended pap and HR HPV testing in 12 months if results showed VAIN I or II. Patient has declined treatment.   Had an endoscopy for a positive FIT test.  She has gastritis and tx with Prilosec.   Working prn now.  Has a grand daughter now.  PCP: Kelton Pillar, MD    Patient's last menstrual period was 02/02/2011.           Sexually active: No.  The current method of family planning is status post hysterectomy.    Exercising: Yes.    Deep water aerobics 3x/week Smoker:  no  Health Maintenance: Pap: 12-15-17 Vag.pap ASCUS:Pos HR HPV,11-18-16 Vag.pap ASCUS:Pos HR HPV, 04-29-16 Vag pap Neg:Pos HR HPV;neg 16/18/45, 04-17-15 LGSIL:Pos HR HPV History of abnormal Pap:  Yes--see pap HX below--  Sees Dr. Spero Geralds is now recommending pap and HR HPV testing once yearly as long as she has VAIN I or II.  Colposcopy with biopy showed moderate to severe dysplasia 08/11/83. Had cryotherapy following this in [place], [place].  Pap 12/06/09 - LGSIL and positive HR HPV Pap 12/17/10 - CIN I. Status post hysterectomy 2013 for fibroids and focal CIN I w/clear margins. Pap 04/06/14 - showed LGSIL Colpo 05/14/14 - biopsy of left vaginal sidewall - VAIN II. Colposcopy with GYN ONC 07/27/14 - no high grade disease seen. No biopsies. Colposcopy 4-3-17revealed HPV changes but no dysplasia.  Pap 12-05-15 showed LGSIL and possible HGSIL. Biopsies showed LGSIL and HGSIL. Pap 04/29/16 negative, HR HPV positive.  Pap 11/18/16 ASCUS:Pos HR HPV; 11/19/18Biopsy showing VAIN II. Pap 10-40-29 ASCUS:Pos HR HPV--vaginal bx squamous mucosa w/Dr.Rossi  MMG: 03-23-18 3D/Neg/density B/BiRads1 Colonoscopy:  11/09/16 Polyp removed; f/u 10 years BMD: 04-13-18  Result :Osteopenia of  hip TDaP: up to date w/PCP Gardasil:   n/a [id number] blood Hep C:donated blood Screening Labs:  PCP.  Flu vaccine:  Completed in September.    reports that she has never smoked. She has never used smokeless tobacco. She reports current alcohol use of about 10.0 - 12.0 standard drinks of alcohol per week. She reports that she does not use drugs.  Past Medical History:  Diagnosis Date  . Allergy   . Anxiety    Through divorce  . Arthritis   . ASCUS with positive high risk HPV 12/2006   Negative Colpo BX  . ASCUS with positive high risk HPV 2009  . Chronic kidney disease    kidney stones  . CIN III (cervical intraepithelial neoplasia III) 07/1983   tx'd w/cryo- no subsequent abnormal paps until 2008  . Essential hypertension   . Fibroid 2006   3 cm Right Fundal Fibroid  . GERD (gastroesophageal reflux disease) 2011  . History of kidney stones   . Hypertension   . Osteopenia 2020  . Synovial cyst of lumbar spine 10/2014  . Vitamin D deficiency     Past Surgical History:  Procedure Laterality Date  . ABDOMINAL HYSTERECTOMY  02/23/11   R TLH-fibroids, adenomyosis 221 g, & focal CIN I w/clear margins  . ANTERIOR CERVICAL DECOMP/DISCECTOMY FUSION N/A 07/19/2012   Procedure: ANTERIOR CERVICAL DECOMPRESSION/DISCECTOMY FUSION 2 LEVELS;  Surgeon: Erline Levine, MD;  Location: [facility];  Service: Neurosurgery;  Laterality: N/A;  Anterior Cervical Five-Six Cervical  Six-Seven Anterior Cervical Decompression/Diskectomy/Fusion  . COLONOSCOPY    . CYSTOSCOPY  02/23/2011   PT.STATES SHE DID NOT HAVE  . LASIK  '99  . LUMBAR LAMINECTOMY/DECOMPRESSION MICRODISCECTOMY Right 02/05/2015   Procedure: Right Lumbar four-five Laminectomy with Resection of Synovial Cyst ;  Surgeon: Erline Levine, MD;  Location: [facility];  Service: Neurosurgery;  Laterality: Right;  . TONSILLECTOMY AND ADENOIDECTOMY    . VAGINAL DELIVERY  '92 '94    Current Outpatient Medications  Medication Sig Dispense Refill   . Calcium Carb-Cholecalciferol (CALCIUM 500+D3 PO) Take 1 tablet by mouth daily.    . cyclobenzaprine (FLEXERIL) 10 MG tablet Take 10 mg by mouth 3 (three) times daily as needed for muscle spasms.    Marland Kitchen estrogens, conjugated, (PREMARIN) 0.625 MG tablet TAKE ONE TABLET BY MOUTH DAILY. 90 tablet 2  . hydrochlorothiazide (MICROZIDE) 12.5 MG capsule Take 12.5 mg by mouth daily.     Marland Kitchen ibuprofen (ADVIL,MOTRIN) 200 MG tablet Take 600 mg by mouth every 6 (six) hours as needed.    . metoprolol succinate (TOPROL-XL) 25 MG 24 hr tablet Take 25 mg by mouth daily.    . Multiple Vitamins-Minerals (MULTIVITAMINS THER. W/MINERALS) TABS Take 1 tablet by mouth daily.      Marland Kitchen omeprazole (PRILOSEC) 20 MG capsule Take 1 capsule (20 mg total) by mouth daily. 120 capsule 0  . Wheat Dextrin (BENEFIBER DRINK MIX PO) Take 1 tablet by mouth daily.     Current Facility-Administered Medications  Medication Dose Route Frequency Provider Last Rate Last Dose  . 0.9 %  sodium chloride infusion  500 mL Intravenous Once Nandigam, Venia Minks, MD        Family History  Problem Relation Age of Onset  . Hypertension Mother   . Heart disease Mother   . Cancer Mother        lung  . Hypertension Father   . Heart attack Father   . Colon cancer Neg Hx   . Esophageal cancer Neg Hx   . Pancreatic cancer Neg Hx   . Prostate cancer Neg Hx   . Rectal cancer Neg Hx   . Stomach cancer Neg Hx     Review of Systems  All other systems reviewed and are negative.   Exam:   BP (!) 158/80   Pulse 70   Temp (!) 97.3 F (36.3 C) (Temporal)   Resp 16   Ht 5\' 7"  (1.702 m)   Wt 140 lb (63.5 kg)   LMP 02/02/2011   BMI 21.93 kg/m     General appearance: alert, cooperative and appears stated age Head: normocephalic, without obvious abnormality, atraumatic Neck: no adenopathy, supple, symmetrical, trachea midline and thyroid normal to inspection and palpation Lungs: clear to auscultation bilaterally Breasts: normal appearance, no  masses or tenderness, No nipple retraction or dimpling, No nipple discharge or bleeding, No axillary adenopathy Heart: regular rate and rhythm Abdomen: soft, non-tender; no masses, no organomegaly Extremities: extremities normal, atraumatic, no cyanosis or edema Skin: skin color, texture, turgor normal. No rashes or lesions Lymph nodes: cervical, supraclavicular, and axillary nodes normal. Neurologic: grossly normal  Pelvic: External genitalia:  no lesions              No abnormal inguinal nodes palpated.              Urethra:  normal appearing urethra with no masses, tenderness or lesions              Bartholins and Skenes: normal  Vagina: normal appearing vagina with normal color and discharge, no lesions              Cervix:  absent              Pap taken: No. Bimanual Exam:  Uterus:  absent              Adnexa: no mass, fullness, tenderness              Rectal exam: Yes.  .  Confirms.              Anus:  normal sphincter tone, no lesions  Chaperone was present for exam.  Assessment:   Well woman visit with normal exam. Status post robotic TLH.  Tubes and ovaries remain.  Hx CIN III.  Long standing VAIN II.  Not treated.  Oncology recommending pap and HR HPV yearly.  Osteopenia.  PCP following.  ERT.  Plan: Mammogram screening discussed. Self breast awareness reviewed. Pap to be done in Nov, 2021.  Patient and I discussed this plan.  Guidelines for Calcium, Vitamin D, regular exercise program including cardiovascular and weight bearing exercise. Refill Premarin 0.625 mg daily for one year.  I reviewed WHI and risks of DVT, PE, and stroke.  Follow up annually and prn.   After visit summary provided.

## 2019-01-02 NOTE — Telephone Encounter (Signed)
12 recall entered 12/2019.

## 2019-01-27 MED FILL — HYDROCHLOROTHIAZIDE 12.5 MG: 12.5 | 90 days supply | Qty: 90 | Fill #1

## 2019-01-27 MED FILL — METOPROLOL SUCCINATE ER 25: 25 | 90 days supply | Qty: 90 | Fill #0

## 2019-02-22 DIAGNOSIS — M1711 Unilateral primary osteoarthritis, right knee: Secondary | ICD-10-CM | POA: Diagnosis not present

## 2019-03-20 MED FILL — PREMARIN 0.625 MG TABLET: 0.625 | 90 days supply | Qty: 90 | Fill #0

## 2019-03-28 MED FILL — MAGIC MW LID/MAAL/DP1:1:1: 2 | 3 days supply | Qty: 120 | Fill #0

## 2019-03-28 NOTE — Telephone Encounter (Signed)
Error

## 2019-04-10 MED FILL — AMOXICILLIN 500 MG CAPSULE: 500 | 10 days supply | Qty: 20 | Fill #0

## 2019-04-10 MED FILL — ETODOLAC 400 MG TABS: 400 | 6 days supply | Qty: 20 | Fill #0

## 2019-04-11 ENCOUNTER — Other Ambulatory Visit: Payer: Self-pay | Admitting: Obstetrics and Gynecology

## 2019-04-11 DIAGNOSIS — Z1231 Encounter for screening mammogram for malignant neoplasm of breast: Secondary | ICD-10-CM

## 2019-04-24 MED FILL — HYDROCHLOROTHIAZIDE 12.5 MG: 12.5 | 90 days supply | Qty: 90 | Fill #0

## 2019-04-24 MED FILL — OMEPRAZOLE DR 20 MG CAPSULE: 20 | 30 days supply | Qty: 30 | Fill #1

## 2019-04-24 MED FILL — ETODOLAC 400 MG TABS: 400 | 6 days supply | Qty: 20 | Fill #1

## 2019-04-24 MED FILL — METOPROLOL SUCCINATE ER 25: 25 | 90 days supply | Qty: 90 | Fill #0

## 2019-05-11 ENCOUNTER — Other Ambulatory Visit: Payer: Self-pay

## 2019-05-11 ENCOUNTER — Ambulatory Visit: Payer: PPO | Admitting: Gastroenterology

## 2019-05-11 ENCOUNTER — Encounter: Payer: Self-pay | Admitting: Gastroenterology

## 2019-05-11 ENCOUNTER — Ambulatory Visit
Admission: RE | Admit: 2019-05-11 | Discharge: 2019-05-11 | Disposition: A | Discharge status: Home / Self Care | Payer: PPO | Source: Ambulatory Visit | Attending: Obstetrics and Gynecology | Admitting: Obstetrics and Gynecology

## 2019-05-11 VITALS — BP 132/80 | HR 80 | Temp 98.4°F | Ht 67.0 in | Wt 140.0 lb

## 2019-05-11 DIAGNOSIS — K2100 Gastro-esophageal reflux disease with esophagitis, without bleeding: Secondary | ICD-10-CM

## 2019-05-11 DIAGNOSIS — Z1231 Encounter for screening mammogram for malignant neoplasm of breast: Secondary | ICD-10-CM

## 2019-05-11 MED ORDER — FAMOTIDINE 20 MG PO TABS: 20.0000 mg | ORAL_TABLET | Freq: Every day | ORAL | 3 refills | Status: DC

## 2019-05-11 MED ORDER — OMEPRAZOLE 20 MG PO CPDR: 20.0000 mg | DELAYED_RELEASE_CAPSULE | Freq: Every day | ORAL | 3 refills | Status: DC | PRN

## 2019-05-11 NOTE — Patient Instructions (Signed)
We have sent the following medications to your pharmacy for you to pick up at your convenience:  Omeprazole and Pepcid   If you are age 66 or older, your body mass index should be between 23-30. Your Body mass index is 21.93 kg/m. If this is out of the aforementioned range listed, please consider follow up with your Primary Care Provider.  If you are age 57 or younger, your body mass index should be between 19-25. Your Body mass index is 21.93 kg/m. If this is out of the aformentioned range listed, please consider follow up with your Primary Care Provider.    We see you back in 1 yr. Call before if needed.   Thank you for choosing me and [facility].  Dr.Nandigam

## 2019-05-11 NOTE — Progress Notes (Signed)
Debra Nguyen    [id number]    1955-07-27  Primary Care Physician:Griffin, Margaretha Sheffield, MD  Referring Physician: Kelton Pillar, MD [place] [employer] [place],  [place] [address]   Chief complaint: GERD  HPI: 64 year old female here for follow-up visit for GERD  EGD November 23, 2018: LA grade B erosive esophagitis biopsy showed increased intraepithelial eosinophils in the distal esophagus biopsies, gastritis, gastric biopsies negative for H. Pylori  She took omeprazole 20 mg daily for 3 months, overall significant improvement of symptoms but once she discontinued she had rebound heartburn and atypical chest pain, was worse during and after exercise.  She also noticed intermittent dysphagia especially worse with raw vegetables or apple slices.  She has since started taking omeprazole 20 mg once every 2 to 3 days and Tums as needed and feels her symptoms have improved.  Denies any dysphagia or odynophagia. Denies any nausea, vomiting, abdominal pain, melena or bright red blood per rectum   Colonoscopy November 09, 2016: 2 mm adenomatous polyp removed from cecum, hypertrophied anal papilla otherwise unremarkable exam    Outpatient Encounter Medications as of 05/11/2019  Medication Sig  . Calcium Carb-Cholecalciferol (CALCIUM 500+D3 PO) Take 1 tablet by mouth daily.  . cyclobenzaprine (FLEXERIL) 10 MG tablet Take 10 mg by mouth 3 (three) times daily as needed for muscle spasms.  Marland Kitchen estrogens, conjugated, (PREMARIN) 0.625 MG tablet TAKE ONE TABLET BY MOUTH DAILY.  . hydrochlorothiazide (MICROZIDE) 12.5 MG capsule Take 12.5 mg by mouth daily.   Marland Kitchen ibuprofen (ADVIL,MOTRIN) 200 MG tablet Take 600 mg by mouth every 6 (six) hours as needed.  . metoprolol succinate (TOPROL-XL) 25 MG 24 hr tablet Take 25 mg by mouth daily.  . Multiple Vitamins-Minerals (MULTIVITAMINS THER. W/MINERALS) TABS Take 1 tablet by mouth daily.    Marland Kitchen omeprazole (PRILOSEC) 20 MG capsule Take 1 capsule  (20 mg total) by mouth daily.  . Wheat Dextrin (BENEFIBER DRINK MIX PO) Take 1 tablet by mouth daily.   Facility-Administered Encounter Medications as of 05/11/2019  Medication  . 0.9 %  sodium chloride infusion    Allergies as of 05/11/2019 - Review Complete 12/21/2018  Allergen Reaction Noted  . Codeine Itching 02/02/2011  . Ultram [tramadol] Itching 12/27/2012    Past Medical History:  Diagnosis Date  . Allergy   . Anxiety    Through divorce  . Arthritis   . ASCUS with positive high risk HPV 12/2006   Negative Colpo BX  . ASCUS with positive high risk HPV 2009  . Chronic kidney disease    kidney stones  . CIN III (cervical intraepithelial neoplasia III) 07/1983   tx'd w/cryo- no subsequent abnormal paps until 2008  . Essential hypertension   . Fibroid 2006   3 cm Right Fundal Fibroid  . GERD (gastroesophageal reflux disease) 2011  . History of kidney stones   . Hypertension   . Osteopenia 2020  . Synovial cyst of lumbar spine 10/2014  . Vitamin D deficiency     Past Surgical History:  Procedure Laterality Date  . ABDOMINAL HYSTERECTOMY  02/23/11   R TLH-fibroids, adenomyosis 221 g, & focal CIN I w/clear margins  . ANTERIOR CERVICAL DECOMP/DISCECTOMY FUSION N/A 07/19/2012   Procedure: ANTERIOR CERVICAL DECOMPRESSION/DISCECTOMY FUSION 2 LEVELS;  Surgeon: Erline Levine, MD;  Location: [facility];  Service: Neurosurgery;  Laterality: N/A;  Anterior Cervical Five-Six Cervical Six-Seven Anterior Cervical Decompression/Diskectomy/Fusion  . COLONOSCOPY    .  CYSTOSCOPY  02/23/2011   PT.STATES SHE DID NOT HAVE  . LASIK  '99  . LUMBAR LAMINECTOMY/DECOMPRESSION MICRODISCECTOMY Right 02/05/2015   Procedure: Right Lumbar four-five Laminectomy with Resection of Synovial Cyst ;  Surgeon: Erline Levine, MD;  Location: [facility];  Service: Neurosurgery;  Laterality: Right;  . TONSILLECTOMY AND ADENOIDECTOMY    . VAGINAL DELIVERY  '92 '94    Family History  Problem Relation Age of  Onset  . Hypertension Mother   . Heart disease Mother   . Cancer Mother        lung  . Hypertension Father   . Heart attack Father   . Colon cancer Neg Hx   . Esophageal cancer Neg Hx   . Pancreatic cancer Neg Hx   . Prostate cancer Neg Hx   . Rectal cancer Neg Hx   . Stomach cancer Neg Hx     Social History   Socioeconomic History  . Marital status: Divorced    Spouse name: Not on file  . Number of children: Not on file  . Years of education: Not on file  . Highest education level: Not on file  Occupational History  . Not on file  Tobacco Use  . Smoking status: Never Smoker  . Smokeless tobacco: Never Used  Substance and Sexual Activity  . Alcohol use: Yes    Alcohol/week: 10.0 - 12.0 standard drinks    Types: 10 - 12 Glasses of wine per week    Comment: 10-12 glasses of wine a week  . Drug use: No  . Sexual activity: Not Currently    Partners: Male    Birth control/protection: Post-menopausal, Surgical    Comment: R-TLH  Other Topics Concern  . Not on file  Social History Narrative  . Not on file   Social Determinants of Health   Financial Resource Strain:   . Difficulty of Paying Living Expenses:   Food Insecurity:   . Worried About Charity fundraiser in the Last Year:   . Arboriculturist in the Last Year:   Transportation Needs:   . Film/video editor (Medical):   Marland Kitchen Lack of Transportation (Non-Medical):   Physical Activity:   . Days of Exercise per Week:   . Minutes of Exercise per Session:   Stress:   . Feeling of Stress :   Social Connections:   . Frequency of Communication with Friends and Family:   . Frequency of Social Gatherings with Friends and Family:   . Attends Religious Services:   . Active Member of Clubs or Organizations:   . Attends Archivist Meetings:   Marland Kitchen Marital Status:   Intimate Partner Violence:   . Fear of Current or Ex-Partner:   . Emotionally Abused:   Marland Kitchen Physically Abused:   . Sexually Abused:        Review of systems:  All other review of systems negative except as mentioned in the HPI.   Physical Exam: Vitals:   05/11/19 0932  BP: 132/80  Pulse: 80  Temp: 98.4 F (36.9 C)   Body mass index is 21.93 kg/m. Gen:      No acute distress Neuro: alert and oriented x 3 Psych: normal mood and affect  Data Reviewed:  Reviewed labs, radiology imaging, old records and pertinent past GI work up   Assessment and Plan/Recommendations:  64 year old female with GERD associated with erosive esophagitis  Continue omeprazole 20 mg daily as needed Use Pepcid 20 mg daily  as needed for breakthrough symptoms or milder GERD related symptoms Antireflux measures and lifestyle modifications  Return in 1 year or sooner if needed  This visit required 30 minutes of patient care (this includes precharting, chart review, review of results, face-to-face time used for counseling as well as treatment plan and follow-up. The patient was provided an opportunity to ask questions and all were answered. The patient agreed with the plan and demonstrated an understanding of the instructions.  Damaris Hippo , MD    CC: Kelton Pillar, MD

## 2019-05-26 MED FILL — CYCLOBENZAPRINE HCL 10 MG T: 10 | 20 days supply | Qty: 60 | Fill #0

## 2019-06-19 MED FILL — PREMARIN 0.625 MG TABLET: 0.625 | 90 days supply | Qty: 90 | Fill #1

## 2019-06-26 DIAGNOSIS — M25561 Pain in right knee: Secondary | ICD-10-CM | POA: Diagnosis not present

## 2019-07-07 ENCOUNTER — Encounter: Payer: Self-pay | Admitting: Gastroenterology

## 2019-09-20 MED FILL — PREMARIN 0.625 MG TABLET: 0.625 | 90 days supply | Qty: 90 | Fill #2

## 2019-10-16 MED FILL — METOPROLOL SUCCINATE ER 25: 25 | 90 days supply | Qty: 90 | Fill #0

## 2019-10-16 MED FILL — HYDROCHLOROTHIAZIDE 12.5 MG: 12.5 | 90 days supply | Qty: 90 | Fill #0

## 2019-10-18 DIAGNOSIS — M25561 Pain in right knee: Secondary | ICD-10-CM | POA: Diagnosis not present

## 2019-10-25 DIAGNOSIS — M1711 Unilateral primary osteoarthritis, right knee: Secondary | ICD-10-CM | POA: Diagnosis not present

## 2019-11-01 DIAGNOSIS — M1711 Unilateral primary osteoarthritis, right knee: Secondary | ICD-10-CM | POA: Diagnosis not present

## 2019-12-08 DIAGNOSIS — E559 Vitamin D deficiency, unspecified: Secondary | ICD-10-CM | POA: Diagnosis not present

## 2019-12-08 DIAGNOSIS — E7800 Pure hypercholesterolemia, unspecified: Secondary | ICD-10-CM | POA: Diagnosis not present

## 2019-12-11 ENCOUNTER — Other Ambulatory Visit: Payer: Self-pay | Admitting: Obstetrics and Gynecology

## 2019-12-11 DIAGNOSIS — Z01419 Encounter for gynecological examination (general) (routine) without abnormal findings: Secondary | ICD-10-CM

## 2019-12-11 NOTE — Telephone Encounter (Signed)
Medication refill request: Premarin 0.625mg   Last AEX:  12/21/18 Next AEX: 12/26/19  Last MMG (if hormonal medication request): 05/11/19  Neg  Refill authorized: 90/0

## 2019-12-18 ENCOUNTER — Other Ambulatory Visit (HOSPITAL_COMMUNITY): Payer: Self-pay | Admitting: Dermatology

## 2019-12-18 DIAGNOSIS — L570 Actinic keratosis: Secondary | ICD-10-CM | POA: Diagnosis not present

## 2019-12-18 DIAGNOSIS — L578 Other skin changes due to chronic exposure to nonionizing radiation: Secondary | ICD-10-CM | POA: Diagnosis not present

## 2019-12-18 DIAGNOSIS — L814 Other melanin hyperpigmentation: Secondary | ICD-10-CM | POA: Diagnosis not present

## 2019-12-18 DIAGNOSIS — L719 Rosacea, unspecified: Secondary | ICD-10-CM | POA: Diagnosis not present

## 2019-12-18 DIAGNOSIS — L821 Other seborrheic keratosis: Secondary | ICD-10-CM | POA: Diagnosis not present

## 2019-12-18 DIAGNOSIS — D225 Melanocytic nevi of trunk: Secondary | ICD-10-CM | POA: Diagnosis not present

## 2019-12-18 MED FILL — metroNIDAZOLE 0.75 % CREA: 0.75 | 20 days supply | Qty: 45 | Fill #0

## 2019-12-21 DIAGNOSIS — Z0000 Encounter for general adult medical examination without abnormal findings: Secondary | ICD-10-CM | POA: Diagnosis not present

## 2019-12-21 DIAGNOSIS — I10 Essential (primary) hypertension: Secondary | ICD-10-CM | POA: Diagnosis not present

## 2019-12-21 DIAGNOSIS — E7800 Pure hypercholesterolemia, unspecified: Secondary | ICD-10-CM | POA: Diagnosis not present

## 2019-12-21 DIAGNOSIS — E559 Vitamin D deficiency, unspecified: Secondary | ICD-10-CM | POA: Diagnosis not present

## 2019-12-21 DIAGNOSIS — M8580 Other specified disorders of bone density and structure, unspecified site: Secondary | ICD-10-CM | POA: Diagnosis not present

## 2019-12-21 DIAGNOSIS — Z1389 Encounter for screening for other disorder: Secondary | ICD-10-CM | POA: Diagnosis not present

## 2019-12-21 DIAGNOSIS — K219 Gastro-esophageal reflux disease without esophagitis: Secondary | ICD-10-CM | POA: Diagnosis not present

## 2019-12-21 DIAGNOSIS — Z23 Encounter for immunization: Secondary | ICD-10-CM | POA: Diagnosis not present

## 2019-12-26 ENCOUNTER — Encounter: Payer: Self-pay | Admitting: Obstetrics and Gynecology

## 2019-12-26 ENCOUNTER — Ambulatory Visit (INDEPENDENT_AMBULATORY_CARE_PROVIDER_SITE_OTHER): Payer: PPO | Admitting: Obstetrics and Gynecology

## 2019-12-26 ENCOUNTER — Other Ambulatory Visit: Payer: Self-pay | Admitting: Obstetrics and Gynecology

## 2019-12-26 ENCOUNTER — Other Ambulatory Visit (HOSPITAL_COMMUNITY)
Admission: RE | Admit: 2019-12-26 | Discharge: 2019-12-26 | Disposition: A | Discharge status: Home / Self Care | Payer: PPO | Source: Ambulatory Visit | Attending: Obstetrics and Gynecology | Admitting: Obstetrics and Gynecology

## 2019-12-26 ENCOUNTER — Other Ambulatory Visit: Payer: Self-pay

## 2019-12-26 VITALS — BP 152/76 | HR 80 | Resp 16 | Ht 66.5 in | Wt 139.0 lb

## 2019-12-26 DIAGNOSIS — Z01419 Encounter for gynecological examination (general) (routine) without abnormal findings: Secondary | ICD-10-CM | POA: Insufficient documentation

## 2019-12-26 DIAGNOSIS — Z124 Encounter for screening for malignant neoplasm of cervix: Secondary | ICD-10-CM | POA: Insufficient documentation

## 2019-12-26 DIAGNOSIS — Z1151 Encounter for screening for human papillomavirus (HPV): Secondary | ICD-10-CM | POA: Diagnosis not present

## 2019-12-26 DIAGNOSIS — R87612 Low grade squamous intraepithelial lesion on cytologic smear of cervix (LGSIL): Secondary | ICD-10-CM | POA: Diagnosis not present

## 2019-12-26 MED ORDER — ESTROGENS CONJUGATED 0.625 MG PO TABS: ORAL_TABLET | ORAL | 3 refills | Status: DC

## 2019-12-26 MED FILL — PREMARIN 0.625 MG TABLET: 0.625 | 90 days supply | Qty: 90 | Fill #0

## 2019-12-26 NOTE — Progress Notes (Signed)
64 y.o. G68P2002 Divorced Caucasian female here for annual exam.    Ran out of her Premarin.  Wants to continue.   Will have a right knee replacement in the future.   Retired from [employer].  Has 2 grandchildren.   Received Covid booster vaccine and flu vaccine.   PCP:  Kelton Pillar, MD  Patient's last menstrual period was 02/02/2011.           Sexually active: No.  The current method of family planning is status post hysterectomy.    Exercising: Yes.    water aerobics Smoker:  no  Health Maintenance: Pap:  12-15-17 Vag.pap ASCUS:Pos HR HPV,11-18-16 Vag.pap ASCUS:Pos HR HPV, 04-29-16 Vag pap Neg:Pos HR HPV;neg 16/18/45, 04-17-15 LGSIL:Pos HR HPV History of abnormal Pap:  Yes  MMG:  05/11/19 BIRADS 1 negative/density b Colonoscopy:  11/09/16 Polyp removed f/u 10 years BMD:   04/13/18  Result  Osteopenia of hip TDaP:  UTD per patient Gardasil:   n/a HIV: donated blood in the past Hep C: donated blood in the past Screening Labs:  PCP   reports that she has never smoked. She has never used smokeless tobacco. She reports current alcohol use of about 10.0 - 12.0 standard drinks of alcohol per week. She reports that she does not use drugs.  Past Medical History:  Diagnosis Date  . Allergy   . Anxiety    Through divorce  . Arthritis   . ASCUS with positive high risk HPV 12/2006   Negative Colpo BX  . ASCUS with positive high risk HPV 2009  . Chronic kidney disease    kidney stones  . CIN III (cervical intraepithelial neoplasia III) 07/1983   tx'd w/cryo- no subsequent abnormal paps until 2008  . Essential hypertension   . Fibroid 2006   3 cm Right Fundal Fibroid  . GERD (gastroesophageal reflux disease) 2011  . History of kidney stones   . Hypertension   . Osteopenia 2020  . Synovial cyst of lumbar spine 10/2014  . Vitamin D deficiency     Past Surgical History:  Procedure Laterality Date  . ABDOMINAL HYSTERECTOMY  02/23/11   R TLH-fibroids, adenomyosis 221 g, & focal  CIN I w/clear margins  . ANTERIOR CERVICAL DECOMP/DISCECTOMY FUSION N/A 07/19/2012   Procedure: ANTERIOR CERVICAL DECOMPRESSION/DISCECTOMY FUSION 2 LEVELS;  Surgeon: Erline Levine, MD;  Location: [facility];  Service: Neurosurgery;  Laterality: N/A;  Anterior Cervical Five-Six Cervical Six-Seven Anterior Cervical Decompression/Diskectomy/Fusion  . COLONOSCOPY    . CYSTOSCOPY  02/23/2011   PT.STATES SHE DID NOT HAVE  . dental implants    . LASIK  '99  . LUMBAR LAMINECTOMY/DECOMPRESSION MICRODISCECTOMY Right 02/05/2015   Procedure: Right Lumbar four-five Laminectomy with Resection of Synovial Cyst ;  Surgeon: Erline Levine, MD;  Location: [facility];  Service: Neurosurgery;  Laterality: Right;  . TONSILLECTOMY AND ADENOIDECTOMY    . VAGINAL DELIVERY  '92 '94    Current Outpatient Medications  Medication Sig Dispense Refill  . Calcium Carb-Cholecalciferol (CALCIUM 500+D3 PO) Take 1 tablet by mouth daily.    . calcium carbonate (TUMS - DOSED IN MG ELEMENTAL CALCIUM) 500 MG chewable tablet 1-2  As needed    . cyclobenzaprine (FLEXERIL) 10 MG tablet Take 10 mg by mouth 3 (three) times daily as needed for muscle spasms.    Marland Kitchen estrogens, conjugated, (PREMARIN) 0.625 MG tablet TAKE 1 TABLET BY MOUTH DAILY. 30 tablet 0  . famotidine (PEPCID) 20 MG tablet Take 1 tablet (20 mg  total) by mouth daily. 90 tablet 3  . hydrochlorothiazide (MICROZIDE) 12.5 MG capsule Take 12.5 mg by mouth daily.     . metoprolol succinate (TOPROL-XL) 25 MG 24 hr tablet Take 25 mg by mouth daily.    . Multiple Vitamins-Minerals (MULTIVITAMINS THER. W/MINERALS) TABS Take 1 tablet by mouth daily.      Marland Kitchen omeprazole (PRILOSEC) 20 MG capsule Take 1 capsule (20 mg total) by mouth daily as needed. 90 capsule 3  . Wheat Dextrin (BENEFIBER DRINK MIX PO) Take 1 tablet by mouth daily.     No current facility-administered medications for this visit.    Family History  Problem Relation Age of Onset  . Hypertension Mother   . Heart  disease Mother   . Cancer Mother        lung  . Hypertension Father   . Heart attack Father   . Colon cancer Neg Hx   . Esophageal cancer Neg Hx   . Pancreatic cancer Neg Hx   . Prostate cancer Neg Hx   . Rectal cancer Neg Hx   . Stomach cancer Neg Hx     Review of Systems  Constitutional: Negative.   HENT: Negative.   Eyes: Negative.   Respiratory: Negative.   Cardiovascular: Negative.   Gastrointestinal: Negative.   Endocrine: Negative.   Genitourinary: Negative.   Musculoskeletal: Negative.   Skin: Negative.   Allergic/Immunologic: Negative.   Neurological: Negative.   Hematological: Negative.   Psychiatric/Behavioral: Negative.     Exam:   BP (!) 152/76 (BP Location: Left Arm, Patient Position: Sitting, Cuff Size: Normal)   Pulse 80   Resp 16   Ht 5' 6.5" (1.689 m)   Wt 139 lb (63 kg)   LMP 02/02/2011   BMI 22.10 kg/m     General appearance: alert, cooperative and appears stated age Head: normocephalic, without obvious abnormality, atraumatic Neck: no adenopathy, supple, symmetrical, trachea midline and thyroid normal to inspection and palpation Lungs: clear to auscultation bilaterally Breasts: normal appearance, no masses or tenderness, No nipple retraction or dimpling, No nipple discharge or bleeding, No axillary adenopathy Heart: regular rate and rhythm Abdomen: soft, non-tender; no masses, no organomegaly Extremities: extremities normal, atraumatic, no cyanosis or edema Skin: skin color, texture, turgor normal. No rashes or lesions Lymph nodes: cervical, supraclavicular, and axillary nodes normal. Neurologic: grossly normal  Pelvic: External genitalia:  no lesions              No abnormal inguinal nodes palpated.              Urethra:  normal appearing urethra with no masses, tenderness or lesions              Bartholins and Skenes: normal                 Vagina: normal appearing vagina with normal color and discharge, no lesions              Cervix:  absent              Pap taken: Yes.   Bimanual Exam:  Uterus:  absent              Adnexa: no mass, fullness, tenderness              Rectal exam: Yes.  .  Confirms.              Anus:  normal sphincter tone, no lesions  Chaperone was present for exam.  Assessment:   Well woman visit with normal exam. Status post robotic TLH. Tubes and ovaries remain.  Hx CIN III. Long standing VAIN II.Not treated. Oncology recommending pap and HR HPV yearly.  Osteopenia.  PCP following.  ERT.  Plan: Mammogram screening discussed. Self breast awareness reviewed. Pap and HR HPV as above. Guidelines for Calcium, Vitamin D, regular exercise program including cardiovascular and weight bearing exercise. Refill of ERT.  WHI discussed and risks of stroke, DVT, and PE.  Follow up annually and prn.

## 2019-12-26 NOTE — Patient Instructions (Signed)

## 2019-12-29 LAB — CYTOLOGY - PAP
Comment: NEGATIVE
High risk HPV: POSITIVE — AB

## 2020-01-05 ENCOUNTER — Telehealth: Payer: Self-pay

## 2020-01-05 NOTE — Telephone Encounter (Signed)
Spoke with pt. Pt given results and recommendations per Dr Quincy Simmonds. Pt agreeable and verbalized understanding.   Pt asking since has been seen for this multiple times and has seen by Dr Denman George to monitor paps. Is the colpo still recommended, ok to continue to monitor. Pt states "feels unchanged"   Routing to Dr Quincy Simmonds, please advise

## 2020-01-05 NOTE — Telephone Encounter (Signed)
-----   Message from Nunzio Cobbs, MD sent at 01/03/2020  1:02 PM EST ----- Please contact patient with results of pap of vagina showing LGSIL and positive HR HPV.  I do recommend colposcopy.  Please schedule with me.

## 2020-01-07 NOTE — Telephone Encounter (Signed)
I recommend colposcopy.  A pap is only a screening test.  The colposcopy is diagnostic and more accurate.

## 2020-01-08 ENCOUNTER — Telehealth: Payer: Self-pay | Admitting: *Deleted

## 2020-01-08 NOTE — Telephone Encounter (Signed)
Patient called and stated "I saw Dr Quincy Simmonds and my pap came back with low grade changes. Dr Quincy Simmonds wants to do another colposcopy. But Dr Denman George and I talked about a different plan if this happened again. Should I left Dr Quincy Simmonds do the colposcopy?"

## 2020-01-08 NOTE — Telephone Encounter (Signed)
Told Debra Nguyen that Dr. Denman George said to let Dr. Quincy Simmonds do the colposcopy. Pt verbalized understanding.

## 2020-01-08 NOTE — Telephone Encounter (Signed)
Recall placed Dec 2021.

## 2020-01-08 NOTE — Telephone Encounter (Signed)
Spoke with pt. Pt given update and recommendations per Dr Quincy Simmonds. Pt declines to schedule at this time. Pt states will send email or message to Dr Denman George for advice on having next colpo or not.  Advised pt will give update to Dr Quincy Simmonds.  Routing to Dr Quincy Simmonds for update Encounter closed

## 2020-01-08 NOTE — Telephone Encounter (Signed)
Please place patient in recall for December, 2021.

## 2020-01-23 ENCOUNTER — Other Ambulatory Visit (HOSPITAL_COMMUNITY): Payer: Self-pay | Admitting: Family Medicine

## 2020-01-23 MED FILL — HYDROCHLOROTHIAZIDE 12.5 MG: 12.5 | 90 days supply | Qty: 90 | Fill #0

## 2020-01-23 MED FILL — METOPROLOL SUCCINATE ER 50: 50 | 90 days supply | Qty: 90 | Fill #0

## 2020-01-31 ENCOUNTER — Telehealth: Payer: Self-pay

## 2020-01-31 DIAGNOSIS — R87612 Low grade squamous intraepithelial lesion on cytologic smear of cervix (LGSIL): Secondary | ICD-10-CM

## 2020-01-31 DIAGNOSIS — R87810 Cervical high risk human papillomavirus (HPV) DNA test positive: Secondary | ICD-10-CM

## 2020-01-31 NOTE — Telephone Encounter (Signed)
Patient is calling to schedule Colpo.

## 2020-01-31 NOTE — Telephone Encounter (Signed)
Spoke with pt. Pt calling to schedule Colpo with Dr Quincy Simmonds per result notes on 12/26/19 from pap smear of LGSIL and +HR HPV.  Pt scheduled for 03/07/20 at 4 pm with Dr Quincy Simmonds. Pt agreeable to date and time of appt.  Declines earlier offered appts.  Cc: Hayley for precert  Order placed  Routing to Dr Quincy Simmonds for review Encounter closed    Nunzio Cobbs, MD  01/03/2020 1:02 PM EST      Please contact patient with results of pap of vagina showing LGSIL and positive HR HPV.  I do recommend colposcopy.  Please schedule with me.

## 2020-02-21 DIAGNOSIS — I10 Essential (primary) hypertension: Secondary | ICD-10-CM | POA: Diagnosis not present

## 2020-03-07 ENCOUNTER — Ambulatory Visit: Payer: Self-pay | Admitting: Obstetrics and Gynecology

## 2020-03-15 ENCOUNTER — Ambulatory Visit (INDEPENDENT_AMBULATORY_CARE_PROVIDER_SITE_OTHER): Payer: PPO | Admitting: Obstetrics and Gynecology

## 2020-03-15 ENCOUNTER — Other Ambulatory Visit: Payer: Self-pay

## 2020-03-15 ENCOUNTER — Encounter: Payer: Self-pay | Admitting: Obstetrics and Gynecology

## 2020-03-15 ENCOUNTER — Other Ambulatory Visit (HOSPITAL_COMMUNITY)
Admission: RE | Admit: 2020-03-15 | Discharge: 2020-03-15 | Disposition: A | Discharge status: Home / Self Care | Payer: PPO | Source: Ambulatory Visit | Attending: Obstetrics and Gynecology | Admitting: Obstetrics and Gynecology

## 2020-03-15 VITALS — BP 150/94 | HR 90 | Resp 14 | Ht 67.0 in | Wt 143.0 lb

## 2020-03-15 DIAGNOSIS — R87622 Low grade squamous intraepithelial lesion on cytologic smear of vagina (LGSIL): Secondary | ICD-10-CM

## 2020-03-15 DIAGNOSIS — N891 Moderate vaginal dysplasia: Secondary | ICD-10-CM | POA: Insufficient documentation

## 2020-03-15 DIAGNOSIS — R87811 Vaginal high risk human papillomavirus (HPV) DNA test positive: Secondary | ICD-10-CM

## 2020-03-15 DIAGNOSIS — R87613 High grade squamous intraepithelial lesion on cytologic smear of cervix (HGSIL): Secondary | ICD-10-CM | POA: Insufficient documentation

## 2020-03-15 NOTE — Progress Notes (Signed)
  Subjective:     Patient ID: Debra Nguyen, female   DOB: 23-Oct-1955, 65 y.o.   MRN: [id number]  HPI Pap history:  12-26-19 LSIL: pos HR HPV 12-15-17 Vag.pap ASCUS:Pos HR HPV, 11-18-16 Vag.pap ASCUS:Pos HR HPV, 04-29-16 Vag pap Neg:Pos HR HPV;neg 16/18/45,  04-17-15 LGSIL:Pos HR HPV  Prior colpo biopsy showing VAIN II.  Patient has had GYN ONC consultation and chosen to do observational management.   Review of Systems  All other systems reviewed and are negative.   Contraception: hysterectomy     Objective:   Physical Exam Genitourinary:      Colposcopy of vagina. Consent for procedure.  3% acetic acid and then Lugol's used in vagina. White light and green light filter used.  Findings:    Mild acetowhite changes and decreased Lugol's uptake at the left vaginal apex.  Biopsies:  Left inferior apex, and left superior apex. Monsel's placed.  Minimal EBL. No complications.     Assessment:     LGSIL vaginal pap and positive HR HPV.  Prior VAIN II.     Plan:     FU biopsies.  We discussed laser treatment or Effudex for persistent vaginal dysplasia. Treatment will not remove HPV.

## 2020-03-15 NOTE — Patient Instructions (Signed)
[email or web address]  Colposcopy, Care After This sheet gives you information about how to care for yourself after your procedure. Your health care provider may also give you more specific instructions. If you have problems or questions, contact your health care provider. What can I expect after the procedure? If you had a colposcopy without a biopsy, you can expect to feel fine right away after your procedure. However, you may have some spotting of blood for a few days. You can return to your normal activities. If you had a colposcopy with a biopsy, it is common after the procedure to have:  Soreness and mild pain. These may last for a few days.  Light-headedness.  Mild vaginal bleeding or discharge that is dark-colored and grainy. This may last for a few days. The discharge may be caused by a liquid (solution) that was used during the procedure. You may need to wear a sanitary pad during this time.  Spotting of blood for at least 48 hours after the procedure. Follow these instructions at home: Medicines  Take over-the-counter and prescription medicines only as told by your health care provider.  Talk with your health care provider about what type of over-the-counter pain medicine and prescription medicine you can start to take again. It is especially important to talk with your health care provider if you take blood thinners. Activity  Limit your physical activity for the first day after your procedure as told by your health care provider.  Avoid using douche products, using tampons, or having sex for at least 3 days after the procedure or for as long as told.  Return to your normal activities as told by your health care provider. Ask your health care provider what activities are safe for you. General instructions  Drink enough fluid to keep your urine pale yellow.  Ask your health care provider if you may take baths, swim, or use a hot tub. You may take  showers.  If you use birth control (contraception), continue to use it.  Keep all follow-up visits as told by your health care provider. This is important.   Contact a health care provider if:  You develop a skin rash. Get help right away if:  You bleed a lot from your vagina or pass blood clots. This includes using more than one sanitary pad each hour for 2 hours in a row.  You have a fever or chills.  You have vaginal discharge that is abnormal, is yellow in color, or smells bad. This could be a sign of infection.  You have severe pain or cramps in your lower abdomen that do not go away with medicine.  You faint. Summary  If you had a colposcopy without a biopsy, you can expect to feel fine right away, but you may have some spotting of blood for a few days. You can return to your normal activities.  If you had a colposcopy with a biopsy, it is common to have mild pain for a few days and spotting for 48 hours after the procedure.  Avoid using douche products, using tampons, and having sex for at least 3 days after the procedure or for as long as told by your health care provider.  Get help right away if you have heavy bleeding, severe pain, or signs of infection. This information is not intended to replace advice given to you by your health care provider. Make sure you discuss any questions you have with your health care provider. Document Revised: 02/01/2019 Document Reviewed:   02/01/2019 Elsevier Patient Education  2021 Elsevier Inc.  

## 2020-03-18 ENCOUNTER — Other Ambulatory Visit (HOSPITAL_COMMUNITY): Payer: Self-pay | Admitting: Family Medicine

## 2020-03-18 MED FILL — PREMARIN 0.625 MG TABLET: 0.625 | 90 days supply | Qty: 90 | Fill #1

## 2020-03-18 MED FILL — AMLODIPINE BESYLATE 5 MG TA: 5 | 30 days supply | Qty: 30 | Fill #0

## 2020-03-20 LAB — SURGICAL PATHOLOGY

## 2020-04-15 MED FILL — HYDROCHLOROTHIAZIDE 12.5 MG: 12.5 | 90 days supply | Qty: 90 | Fill #1

## 2020-04-15 MED FILL — AMLODIPINE BESYLATE 5 MG TA: 5 | 30 days supply | Qty: 30 | Fill #1

## 2020-05-07 ENCOUNTER — Other Ambulatory Visit: Payer: Self-pay | Admitting: Obstetrics and Gynecology

## 2020-05-07 DIAGNOSIS — Z0000 Encounter for general adult medical examination without abnormal findings: Secondary | ICD-10-CM

## 2020-05-16 NOTE — Progress Notes (Signed)
Cardiology Office Note:    Date:  05/17/2020   ID:  Debra Nguyen, DOB [birth year], MRN [id number]  PCP:  Kelton Pillar, MD  Cardiologist:  No primary care provider on file.   Referring MD: Kelton Pillar, MD   Chief Complaint  Patient presents with  . Hypertension    History of Present Illness:    Debra Nguyen is a 65 y.o. female with a hx of  Primary hypertension referred for management suggestions. Previously seen 2011.  Metoprolol was discontinued.  She began feeling not as well, having palpitations.  Metoprolol was not weaned.  Amlodipine was started.  She began having lower extremity swelling and flushing.  She is here for additional suggestions on blood pressure control.  She denies tachycardia currently.  Toprol was discontinued 2 months ago.  Blood pressures have been 140/80 or less for the most part.  She denies chest pain.  She does water aerobics.  Both her mother and father had acute vascular events.  Father lived to be 56 but died after 2 MIs.  Mother had CABG, hypertension, and lung cancer.  We did a stress test in 2010 that revealed elevated blood pressure with activity but no evidence of ischemia.  Her LDL is greater than 100 but HDL is also high.  Past Medical History:  Diagnosis Date  . Allergy   . Anxiety    Through divorce  . Arthritis   . ASCUS with positive high risk HPV 12/2006   Negative Colpo BX  . ASCUS with positive high risk HPV 2009  . Chronic kidney disease    kidney stones  . CIN III (cervical intraepithelial neoplasia III) 07/1983   tx'd w/cryo- no subsequent abnormal paps until 2008  . Essential hypertension   . Fibroid 2006   3 cm Right Fundal Fibroid  . GERD (gastroesophageal reflux disease) 2011  . History of kidney stones   . Hypertension   . Osteopenia 2020  . Synovial cyst of lumbar spine 10/2014  . Vitamin D deficiency     Past Surgical History:  Procedure Laterality Date  . ABDOMINAL HYSTERECTOMY  02/23/11   R  TLH-fibroids, adenomyosis 221 g, & focal CIN I w/clear margins  . ANTERIOR CERVICAL DECOMP/DISCECTOMY FUSION N/A 07/19/2012   Procedure: ANTERIOR CERVICAL DECOMPRESSION/DISCECTOMY FUSION 2 LEVELS;  Surgeon: Erline Levine, MD;  Location: [facility];  Service: Neurosurgery;  Laterality: N/A;  Anterior Cervical Five-Six Cervical Six-Seven Anterior Cervical Decompression/Diskectomy/Fusion  . COLONOSCOPY    . CYSTOSCOPY  02/23/2011   PT.STATES SHE DID NOT HAVE  . dental implants    . LASIK  '99  . LUMBAR LAMINECTOMY/DECOMPRESSION MICRODISCECTOMY Right 02/05/2015   Procedure: Right Lumbar four-five Laminectomy with Resection of Synovial Cyst ;  Surgeon: Erline Levine, MD;  Location: [facility];  Service: Neurosurgery;  Laterality: Right;  . TONSILLECTOMY AND ADENOIDECTOMY    . VAGINAL DELIVERY  '92 '94    Current Medications: Current Meds  Medication Sig  . Calcium Carb-Cholecalciferol (CALCIUM 500+D3 PO) Take 1 tablet by mouth daily.  . calcium carbonate (TUMS - DOSED IN MG ELEMENTAL CALCIUM) 500 MG chewable tablet 1-2  As needed  . cyclobenzaprine (FLEXERIL) 10 MG tablet Take 5 mg by mouth 3 (three) times daily as needed for muscle spasms.  Marland Kitchen estrogens, conjugated, (PREMARIN) 0.625 MG tablet TAKE 1 TABLET BY MOUTH DAILY.  . hydrochlorothiazide (MICROZIDE) 12.5 MG capsule Take 12.5 mg by mouth daily.  Marland Kitchen losartan (COZAAR) 25 MG tablet Take 1  tablet (25 mg total) by mouth daily.  . Multiple Vitamins-Minerals (MULTIVITAMINS THER. W/MINERALS) TABS Take 1 tablet by mouth daily.  . [DISCONTINUED] amLODipine (NORVASC) 5 MG tablet Take 2.5 mg by mouth daily.     Allergies:   Codeine and Ultram [tramadol]   Social History   Socioeconomic History  . Marital status: Divorced    Spouse name: Not on file  . Number of children: 2  . Years of education: Not on file  . Highest education level: Not on file  Occupational History  . Occupation: Programmer, multimedia: [employer]  Tobacco Use  . Smoking  status: Never Smoker  . Smokeless tobacco: Never Used  Vaping Use  . Vaping Use: Never used  Substance and Sexual Activity  . Alcohol use: Yes    Alcohol/week: 10.0 - 12.0 standard drinks    Types: 10 - 12 Glasses of wine per week    Comment: 10-12 glasses of wine a week  . Drug use: No  . Sexual activity: Not Currently    Partners: Male    Birth control/protection: Post-menopausal, Surgical    Comment: R-TLH  Other Topics Concern  . Not on file  Social History Narrative  . Not on file   Social Determinants of Health   Financial Resource Strain: Not on file  Food Insecurity: Not on file  Transportation Needs: Not on file  Physical Activity: Not on file  Stress: Not on file  Social Connections: Not on file     Family History: The patient's family history includes Cancer in her mother; Heart attack in her father; Heart disease in her mother; Hypertension in her father and mother. There is no history of Colon cancer, Esophageal cancer, Pancreatic cancer, Prostate cancer, Rectal cancer, or Stomach cancer.  ROS:   Please see the history of present illness.    No neurological complaints.  Denies claudication.  Has right knee discomfort.  Knees have surgery.  Water aerobics 1 hour 5 times per week.  All other systems reviewed and are negative.  EKGs/Labs/Other Studies Reviewed:    The following studies were reviewed today: No new data.  EKG:  EKG normal sinus rhythm.  Normal tracing.  Recent Labs: No results found for requested labs within last 8760 hours.  Recent Lipid Panel No results found for: CHOL, TRIG, HDL, CHOLHDL, VLDL, LDLCALC, LDLDIRECT  Physical Exam:    VS:  BP (!) 160/82   Pulse 83   Ht 5\' 7"  (1.702 m)   Wt 139 lb 9.6 oz (63.3 kg)   LMP 02/02/2011   SpO2 98%   BMI 21.86 kg/m     Wt Readings from Last 3 Encounters:  05/17/20 139 lb 9.6 oz (63.3 kg)  03/15/20 143 lb (64.9 kg)  12/26/19 139 lb (63 kg)     GEN: Slender and healthy appearing. No  acute distress HEENT: Normal NECK: No JVD. LYMPHATICS: No lymphadenopathy CARDIAC: No murmur. RRR no gallop, or edema. VASCULAR:  Normal Pulses. No bruits. RESPIRATORY:  Clear to auscultation without rales, wheezing or rhonchi  ABDOMEN: Soft, non-tender, non-distended, No pulsatile mass, MUSCULOSKELETAL: No deformity  SKIN: Warm and dry NEUROLOGIC:  Alert and oriented x 3 PSYCHIATRIC:  Normal affect   ASSESSMENT:    1. Primary hypertension   2. Hyperlipidemia LDL goal <70   3. Family history of early CAD    PLAN:    In order of problems listed above:  1. Blood pressure target 130/80 mmHg.  Side effects on  amlodipine.  She is now off beta-blockers for 2 months and not having as much withdrawal type symptoms.  She feels flushed on amlodipine and has ankle swelling.  Discontinue amlodipine.  Start losartan 25 mg/day and continue HCTZ.  Uptitrate losartan.  May have to reinstitute low-dose beta-blocker therapy. 2. LDL is 110.  Coronary calcium score will be done to get a better feeling about her underlying CV risk.  We explained the concept and she is willing. 3. Family history suggests high risk especially and conjunction with lipids and blood pressure.  Overall education and awareness concerning primary/secondary risk prevention was discussed in detail: LDL less than 70, hemoglobin A1c less than 7, blood pressure target less than 130/80 mmHg, >150 minutes of moderate aerobic activity per week, avoidance of smoking, weight control (via diet and exercise), and continued surveillance/management of/for obstructive sleep apnea.    Medication Adjustments/Labs and Tests Ordered: Current medicines are reviewed at length with the patient today.  Concerns regarding medicines are outlined above.  Orders Placed This Encounter  Procedures  . CT CARDIAC SCORING (SELF PAY ONLY)  . Basic metabolic panel  . EKG 12-Lead   Meds ordered this encounter  Medications  . losartan (COZAAR) 25 MG tablet     Sig: Take 1 tablet (25 mg total) by mouth daily.    Dispense:  90 tablet    Refill:  3    Patient Instructions  Medication Instructions:  Your physician has recommended you make the following change in your medication:  Start Losartan 25 mg by mouth daily Stop Amlodipine   *If you need a refill on your cardiac medications before your next appointment, please call your pharmacy*   Lab Work: BMET 7-10 days If you have labs (blood work) drawn today and your tests are completely normal, you will receive your results only by: Marland Kitchen MyChart Message (if you have MyChart) OR . A paper copy in the mail If you have any lab test that is abnormal or we need to change your treatment, we will call you to review the results.   Testing/Procedures: Provider recommends a Calcium Score Test   Follow-Up: At [facility], you and your health needs are our priority.  As part of our continuing mission to provide you with exceptional heart care, we have created designated Provider Care Teams.  These Care Teams include your primary Cardiologist (physician) and Advanced Practice Providers (APPs -  Physician Assistants and Nurse Practitioners) who all work together to provide you with the care you need, when you need it.  We recommend signing up for the patient portal called "MyChart".  Sign up information is provided on this After Visit Summary.  MyChart is used to connect with patients for Virtual Visits (Telemedicine).  Patients are able to view lab/test results, encounter notes, upcoming appointments, etc.  Non-urgent messages can be sent to your provider as well.   To learn more about what you can do with MyChart, go to [email or web address].    Your next appointment:   81month(s)  The format for your next appointment:   In Person  Provider:   You may see Dr. Daneen Schick or one of the following Advanced Practice Providers on your designated Care Team:    Kathyrn Drown, NP    Other  Instructions Your Provider would like you to start to keep a record of you Blood pressures and either call in results, or place them in [place].     Signed, Sinclair Grooms, MD  05/17/2020 10:51 AM    [facility]

## 2020-05-17 ENCOUNTER — Other Ambulatory Visit: Payer: Self-pay

## 2020-05-17 ENCOUNTER — Ambulatory Visit: Payer: PPO | Admitting: Interventional Cardiology

## 2020-05-17 ENCOUNTER — Other Ambulatory Visit: Payer: Self-pay | Admitting: Interventional Cardiology

## 2020-05-17 ENCOUNTER — Encounter: Payer: Self-pay | Admitting: Interventional Cardiology

## 2020-05-17 VITALS — BP 160/82 | HR 83 | Ht 67.0 in | Wt 139.6 lb

## 2020-05-17 DIAGNOSIS — E785 Hyperlipidemia, unspecified: Secondary | ICD-10-CM

## 2020-05-17 DIAGNOSIS — Z8249 Family history of ischemic heart disease and other diseases of the circulatory system: Secondary | ICD-10-CM | POA: Diagnosis not present

## 2020-05-17 DIAGNOSIS — I10 Essential (primary) hypertension: Secondary | ICD-10-CM | POA: Diagnosis not present

## 2020-05-17 MED ORDER — LOSARTAN POTASSIUM 25 MG PO TABS: 25.0000 mg | ORAL_TABLET | Freq: Every day | ORAL | 3 refills | Status: DC

## 2020-05-17 MED FILL — LOSARTAN POTASSIUM 25 MG TA: 25 | 30 days supply | Qty: 30 | Fill #0

## 2020-05-17 NOTE — Patient Instructions (Addendum)
Medication Instructions:  Your physician has recommended you make the following change in your medication:  Start Losartan 25 mg by mouth daily Stop Amlodipine   *If you need a refill on your cardiac medications before your next appointment, please call your pharmacy*   Lab Work: BMET 7-10 days If you have labs (blood work) drawn today and your tests are completely normal, you will receive your results only by: Marland Kitchen MyChart Message (if you have MyChart) OR . A paper copy in the mail If you have any lab test that is abnormal or we need to change your treatment, we will call you to review the results.   Testing/Procedures: Provider recommends a Calcium Score Test   Follow-Up: At [facility], you and your health needs are our priority.  As part of our continuing mission to provide you with exceptional heart care, we have created designated Provider Care Teams.  These Care Teams include your primary Cardiologist (physician) and Advanced Practice Providers (APPs -  Physician Assistants and Nurse Practitioners) who all work together to provide you with the care you need, when you need it.  We recommend signing up for the patient portal called "MyChart".  Sign up information is provided on this After Visit Summary.  MyChart is used to connect with patients for Virtual Visits (Telemedicine).  Patients are able to view lab/test results, encounter notes, upcoming appointments, etc.  Non-urgent messages can be sent to your provider as well.   To learn more about what you can do with MyChart, go to [email or web address].    Your next appointment:   39month(s)  The format for your next appointment:   In Person  Provider:   You may see Dr. Daneen Schick or one of the following Advanced Practice Providers on your designated Care Team:    Kathyrn Drown, NP    Other Instructions Your Provider would like you to start to keep a record of you Blood pressures and either call in results, or place  them in [place].

## 2020-05-29 ENCOUNTER — Other Ambulatory Visit: Payer: PPO | Admitting: *Deleted

## 2020-05-29 ENCOUNTER — Other Ambulatory Visit: Payer: Self-pay

## 2020-05-29 DIAGNOSIS — I10 Essential (primary) hypertension: Secondary | ICD-10-CM

## 2020-05-30 LAB — BASIC METABOLIC PANEL
BUN/Creatinine Ratio: 27 (ref 12–28)
BUN: 17 mg/dL (ref 8–27)
CO2: 24 mmol/L (ref 20–29)
Calcium: 9.1 mg/dL (ref 8.7–10.3)
Chloride: 96 mmol/L (ref 96–106)
Creatinine, Ser: 0.62 mg/dL (ref 0.57–1.00)
Glucose: 146 mg/dL — ABNORMAL HIGH (ref 65–99)
Potassium: 3.6 mmol/L (ref 3.5–5.2)
Sodium: 138 mmol/L (ref 134–144)
eGFR: 98 mL/min/{1.73_m2} (ref >59)

## 2020-06-03 DIAGNOSIS — M1711 Unilateral primary osteoarthritis, right knee: Secondary | ICD-10-CM | POA: Diagnosis not present

## 2020-06-12 ENCOUNTER — Other Ambulatory Visit (HOSPITAL_COMMUNITY): Payer: Self-pay

## 2020-06-12 MED ORDER — PENICILLIN V POTASSIUM 500 MG PO TABS
500.0000 mg | ORAL_TABLET | Freq: Three times a day (TID) | ORAL | 1 refills | Status: DC
  Filled 2020-06-12: qty 20, 7d supply, fill #0
  Filled 2020-09-11: qty 20, 7d supply, fill #1

## 2020-06-12 MED FILL — Losartan Potassium Tab 25 MG: ORAL | 90 days supply | Qty: 90 | Fill #0 | Status: AC

## 2020-06-12 MED FILL — Estrogens, Conjugated Tab 0.625 MG: ORAL | 90 days supply | Qty: 90 | Fill #0 | Status: AC

## 2020-06-18 ENCOUNTER — Other Ambulatory Visit (HOSPITAL_COMMUNITY): Payer: Self-pay

## 2020-06-18 MED ORDER — CHLORHEXIDINE GLUCONATE 0.12 % MT SOLN
OROMUCOSAL | 0 refills | Status: DC
  Filled 2020-06-18: qty 473, 16d supply, fill #0

## 2020-06-18 MED ORDER — DEXAMETHASONE 4 MG PO TABS
4.0000 mg | ORAL_TABLET | Freq: Two times a day (BID) | ORAL | 0 refills | Status: DC
  Filled 2020-06-18: qty 6, 3d supply, fill #0

## 2020-06-19 ENCOUNTER — Other Ambulatory Visit: Payer: Self-pay

## 2020-06-19 ENCOUNTER — Ambulatory Visit (INDEPENDENT_AMBULATORY_CARE_PROVIDER_SITE_OTHER)
Admission: RE | Admit: 2020-06-19 | Discharge: 2020-06-19 | Disposition: A | Discharge status: Home / Self Care | Payer: Self-pay | Source: Ambulatory Visit | Attending: Interventional Cardiology | Admitting: Interventional Cardiology

## 2020-06-19 ENCOUNTER — Other Ambulatory Visit: Payer: PPO

## 2020-06-19 DIAGNOSIS — I10 Essential (primary) hypertension: Secondary | ICD-10-CM

## 2020-06-21 ENCOUNTER — Telehealth: Payer: Self-pay | Admitting: *Deleted

## 2020-06-21 ENCOUNTER — Other Ambulatory Visit (HOSPITAL_COMMUNITY): Payer: Self-pay

## 2020-06-21 DIAGNOSIS — E785 Hyperlipidemia, unspecified: Secondary | ICD-10-CM

## 2020-06-21 MED ORDER — ROSUVASTATIN CALCIUM 5 MG PO TABS
5.0000 mg | ORAL_TABLET | Freq: Every day | ORAL | 3 refills | Status: DC
  Filled 2020-06-21: qty 90, 90d supply, fill #0

## 2020-06-21 NOTE — Telephone Encounter (Signed)
-----   Message from Belva Crome, MD sent at 06/21/2020  3:53 PM EDT ----- Let the patient know the calcium score is 32 which is abnormal and greater than that found in 67% of age-matched controls.  Given family history and positive calcium score, recommend treating lipids.  Goal less than 70.  Recommend rosuvastatin 5 mg/day.  Repeat lipid panel in 6 to 8 weeks after starting. A copy will be sent to Kelton Pillar, MD

## 2020-06-21 NOTE — Telephone Encounter (Signed)
Spoke with pt and reviewed results and recommendations per Dr. Tamala Julian.  Pt agreeable to try Rosuvastatin.  She will come for labs on 08/09/20.  Pt aware to call sooner if any issues with side effects.  Pt appreciative for call.

## 2020-06-24 ENCOUNTER — Other Ambulatory Visit (HOSPITAL_COMMUNITY): Payer: Self-pay

## 2020-06-27 ENCOUNTER — Other Ambulatory Visit (HOSPITAL_COMMUNITY): Payer: Self-pay

## 2020-06-27 DIAGNOSIS — M278 Other specified diseases of jaws: Secondary | ICD-10-CM | POA: Diagnosis not present

## 2020-06-27 MED ORDER — HYDROCODONE-ACETAMINOPHEN 5-325 MG PO TABS
1.0000 | ORAL_TABLET | Freq: Four times a day (QID) | ORAL | 0 refills | Status: DC | PRN
  Filled 2020-06-27: qty 5, 2d supply, fill #0

## 2020-06-27 MED ORDER — DEXAMETHASONE 4 MG PO TABS
4.0000 mg | ORAL_TABLET | Freq: Two times a day (BID) | ORAL | 0 refills | Status: DC
  Filled 2020-06-27: qty 9, 5d supply, fill #0

## 2020-06-28 ENCOUNTER — Ambulatory Visit: Payer: PPO

## 2020-06-28 ENCOUNTER — Other Ambulatory Visit (HOSPITAL_COMMUNITY): Payer: Self-pay

## 2020-07-02 ENCOUNTER — Other Ambulatory Visit (HOSPITAL_COMMUNITY): Payer: Self-pay

## 2020-07-02 MED ORDER — PENICILLIN V POTASSIUM 500 MG PO TABS
500.0000 mg | ORAL_TABLET | Freq: Three times a day (TID) | ORAL | 1 refills | Status: DC
  Filled 2020-07-02: qty 20, 7d supply, fill #0

## 2020-07-17 ENCOUNTER — Other Ambulatory Visit (HOSPITAL_COMMUNITY): Payer: Self-pay

## 2020-07-17 MED FILL — Hydrochlorothiazide Tab 12.5 MG: ORAL | 90 days supply | Qty: 90 | Fill #0 | Status: AC

## 2020-08-07 ENCOUNTER — Other Ambulatory Visit: Payer: Self-pay

## 2020-08-07 ENCOUNTER — Other Ambulatory Visit: Payer: PPO | Admitting: *Deleted

## 2020-08-07 ENCOUNTER — Ambulatory Visit
Admission: RE | Admit: 2020-08-07 | Discharge: 2020-08-07 | Disposition: A | Discharge status: Home / Self Care | Payer: PPO | Source: Ambulatory Visit | Attending: Obstetrics and Gynecology | Admitting: Obstetrics and Gynecology

## 2020-08-07 DIAGNOSIS — Z1231 Encounter for screening mammogram for malignant neoplasm of breast: Secondary | ICD-10-CM | POA: Diagnosis not present

## 2020-08-07 DIAGNOSIS — Z0000 Encounter for general adult medical examination without abnormal findings: Secondary | ICD-10-CM

## 2020-08-07 DIAGNOSIS — E785 Hyperlipidemia, unspecified: Secondary | ICD-10-CM

## 2020-08-07 LAB — LIPID PANEL
Chol/HDL Ratio: 2 ratio (ref 0.0–4.4)
Cholesterol, Total: 194 mg/dL (ref 100–199)
HDL: 96 mg/dL (ref >39)
LDL Chol Calc (NIH): 79 mg/dL (ref 0–99)
Triglycerides: 111 mg/dL (ref 0–149)
VLDL Cholesterol Cal: 19 mg/dL (ref 5–40)

## 2020-08-07 LAB — HEPATIC FUNCTION PANEL
ALT: 19 IU/L (ref 0–32)
AST: 23 IU/L (ref 0–40)
Albumin: 4.2 g/dL (ref 3.8–4.8)
Alkaline Phosphatase: 65 IU/L (ref 44–121)
Bilirubin Total: 0.5 mg/dL (ref 0.0–1.2)
Bilirubin, Direct: 0.16 mg/dL (ref 0.00–0.40)
Total Protein: 6.2 g/dL (ref 6.0–8.5)

## 2020-08-09 ENCOUNTER — Other Ambulatory Visit: Payer: PPO

## 2020-08-13 ENCOUNTER — Telehealth: Payer: Self-pay | Admitting: Interventional Cardiology

## 2020-08-13 ENCOUNTER — Other Ambulatory Visit (HOSPITAL_COMMUNITY): Payer: Self-pay

## 2020-08-13 DIAGNOSIS — E785 Hyperlipidemia, unspecified: Secondary | ICD-10-CM

## 2020-08-13 MED ORDER — ROSUVASTATIN CALCIUM 10 MG PO TABS
10.0000 mg | ORAL_TABLET | Freq: Every day | ORAL | 3 refills | Status: DC
  Filled 2020-08-13: qty 90, 90d supply, fill #0

## 2020-08-13 NOTE — Telephone Encounter (Signed)
PT is returning a call 

## 2020-08-13 NOTE — Telephone Encounter (Signed)
Belva Crome, MD  08/09/2020 11:21 AM EDT      Let the patient know that with her coronary calcification, target LDL is less than 70.  I would recommend increasing rosuvastatin to 10 mg/day and repeating a lipid panel with liver tests in 2 to 3 months.  I am assuming that there are no symptoms with the current dose of rosuvastatin.  If she is having sideeffects, let me know. A copy will be sent to Kelton Pillar, MD  Spoke with the patient who saw results from lab work in [place].  She is agreeable to increase rosuvastatin to 10 mg per day.  Repeat lab work has been scheduled.  Patient also reports that she has been feeling really good. Her blood pressure has been running 130s/80s since starting losartan.

## 2020-09-02 NOTE — Progress Notes (Addendum)
Cardiology Office Note:    Date:  09/03/2020   ID:  Debra Nguyen, DOB [birth year], MRN [id number]  PCP:  Kelton Pillar, MD  Cardiologist:  None   Referring MD: Kelton Pillar, MD   Chief Complaint  Patient presents with   Hypertension    History of Present Illness:    Debra Nguyen is a 65 y.o. female with a hx of primary hypertension, hyperlipidemia, and asymptomatic coronary calcification.  She is doing well on the current medication regimen.  When initially seen beta-blocker therapy had been withdrawn and she was having palpitations.  She was placed on a wean and was able to get off the medications without difficulty.  She was also having lower extremity edema from amlodipine.  She is now doing well on Cozaar 25 mg/day and hydrochlorothiazide 12.5 mg/day.  No side effects.  Past Medical History:  Diagnosis Date   Allergy    Anxiety    Through divorce   Arthritis    ASCUS with positive high risk HPV 12/2006   Negative Colpo BX   ASCUS with positive high risk HPV 2009   Chronic kidney disease    kidney stones   CIN III (cervical intraepithelial neoplasia III) 07/1983   tx'd w/cryo- no subsequent abnormal paps until 2008   Essential hypertension    Fibroid 2006   3 cm Right Fundal Fibroid   GERD (gastroesophageal reflux disease) 2011   History of kidney stones    Hypertension    Osteopenia 2020   Synovial cyst of lumbar spine 10/2014   Vitamin D deficiency     Past Surgical History:  Procedure Laterality Date   ABDOMINAL HYSTERECTOMY  02/23/11   R TLH-fibroids, adenomyosis 221 g, & focal CIN I w/clear margins   ANTERIOR CERVICAL DECOMP/DISCECTOMY FUSION N/A 07/19/2012   Procedure: ANTERIOR CERVICAL DECOMPRESSION/DISCECTOMY FUSION 2 LEVELS;  Surgeon: Erline Levine, MD;  Location: [facility];  Service: Neurosurgery;  Laterality: N/A;  Anterior Cervical Five-Six Cervical Six-Seven Anterior Cervical Decompression/Diskectomy/Fusion   COLONOSCOPY     CYSTOSCOPY   02/23/2011   PT.STATES SHE DID NOT HAVE   dental implants     LASIK  '99   LUMBAR LAMINECTOMY/DECOMPRESSION MICRODISCECTOMY Right 02/05/2015   Procedure: Right Lumbar four-five Laminectomy with Resection of Synovial Cyst ;  Surgeon: Erline Levine, MD;  Location: [facility];  Service: Neurosurgery;  Laterality: Right;   TONSILLECTOMY AND ADENOIDECTOMY     VAGINAL DELIVERY  '92 '94    Current Medications: Current Meds  Medication Sig   Calcium Carb-Cholecalciferol (CALCIUM 500+D3 PO) Take 1 tablet by mouth daily.   calcium carbonate (TUMS - DOSED IN MG ELEMENTAL CALCIUM) 500 MG chewable tablet 1-2  As needed   cyclobenzaprine (FLEXERIL) 10 MG tablet Take 5 mg by mouth 3 (three) times daily as needed for muscle spasms.   estrogens, conjugated, (PREMARIN) 0.625 MG tablet TAKE 1 TABLET BY MOUTH DAILY.   hydrochlorothiazide (HYDRODIURIL) 12.5 MG tablet TAKE 1 TABLET BY MOUTH ONCE A DAY IN THE MORNING   hydrochlorothiazide (MICROZIDE) 12.5 MG capsule Take 12.5 mg by mouth daily.   losartan (COZAAR) 25 MG tablet Take 1 tablet (25 mg total) by mouth daily.   Multiple Vitamins-Minerals (MULTIVITAMINS THER. W/MINERALS) TABS Take 1 tablet by mouth daily.   rosuvastatin (CRESTOR) 10 MG tablet Take 1 tablet (10 mg total) by mouth daily.     Allergies:   Codeine and Ultram [tramadol]   Social History   Socioeconomic History  Marital status: Divorced    Spouse name: Not on file   Number of children: 2   Years of education: Not on file   Highest education level: Not on file  Occupational History   Occupation: Therapist, sports    Employer: [employer]  Tobacco Use   Smoking status: Never   Smokeless tobacco: Never  Vaping Use   Vaping Use: Never used  Substance and Sexual Activity   Alcohol use: Yes    Alcohol/week: 10.0 - 12.0 standard drinks    Types: 10 - 12 Glasses of wine per week    Comment: 10-12 glasses of wine a week   Drug use: No   Sexual activity: Not Currently    Partners: Male     Birth control/protection: Post-menopausal, Surgical    Comment: R-TLH  Other Topics Concern   Not on file  Social History Narrative   Not on file   Social Determinants of Health   Financial Resource Strain: Not on file  Food Insecurity: Not on file  Transportation Needs: Not on file  Physical Activity: Not on file  Stress: Not on file  Social Connections: Not on file     Family History: The patient's family history includes Cancer in her mother; Heart attack in her father; Heart disease in her mother; Hypertension in her father and mother. There is no history of Colon cancer, Esophageal cancer, Pancreatic cancer, Prostate cancer, Rectal cancer, Stomach cancer, or Breast cancer.  ROS:   Please see the history of present illness.    Physically active.  No chest discomfort.  All other systems reviewed and are negative.  EKGs/Labs/Other Studies Reviewed:    The following studies were reviewed today:  Coronary Calcium Score 06/19/2020: IMPRESSION: Coronary calcium score of 32.3. This was 67th percentile for age-, race-, and sex-matched controls.  EKG:  EKG not performed  Recent Labs: 05/29/2020: BUN 17; Creatinine, Ser 0.62; Potassium 3.6; Sodium 138 08/07/2020: ALT 19  Recent Lipid Panel    Component Value Date/Time   CHOL 194 08/07/2020 1159   TRIG 111 08/07/2020 1159   HDL 96 08/07/2020 1159   CHOLHDL 2.0 08/07/2020 1159   LDLCALC 79 08/07/2020 1159    Physical Exam:    VS:  BP 138/72   Pulse 84   Ht 5\' 7"  (1.702 m)   Wt 140 lb 9.6 oz (63.8 kg)   LMP 02/02/2011   SpO2 99%   BMI 22.02 kg/m     Wt Readings from Last 3 Encounters:  09/03/20 140 lb 9.6 oz (63.8 kg)  05/17/20 139 lb 9.6 oz (63.3 kg)  03/15/20 143 lb (64.9 kg)     GEN: Appears healthy. No acute distress HEENT: Normal NECK: No JVD. LYMPHATICS: No lymphadenopathy CARDIAC: No murmur. RRR no gallop, or edema. VASCULAR:  Normal Pulses. No bruits. RESPIRATORY:  Clear to auscultation without rales,  wheezing or rhonchi  ABDOMEN: Soft, non-tender, non-distended, No pulsatile mass, MUSCULOSKELETAL: No deformity  SKIN: Warm and dry NEUROLOGIC:  Alert and oriented x 3 PSYCHIATRIC:  Normal affect   ASSESSMENT:    1. Hyperlipidemia LDL goal <70   2. Coronary artery calcification seen on CT scan   3. Primary hypertension   4. Family history of early CAD    PLAN:    In order of problems listed above:  LDL cholesterol 79.  Recent elbow adjustment and rosuvastatin to 10 mg/day to achieve an LDL of less than 70.  Liver and lipid panel are due in late August. Calcium  score is less than 50.  She does not have other significant risk factors.  We need to consider adding aspirin 81 mg/day. Excellent control on Cozaar 25 mg daily and HCTZ 12.5 mg/day   Overall education and awareness concerning primary/secondary risk prevention was discussed in detail: LDL less than 70, hemoglobin A1c less than 7, blood pressure target less than 130/80 mmHg, >150 minutes of moderate aerobic activity per week, avoidance of smoking, weight control (via diet and exercise), and continued surveillance/management of/for obstructive sleep apnea.    Medication Adjustments/Labs and Tests Ordered: Current medicines are reviewed at length with the patient today.  Concerns regarding medicines are outlined above.  Orders Placed This Encounter  Procedures   Lipid panel   Hepatic function panel   No orders of the defined types were placed in this encounter.   Patient Instructions  Medication Instructions:  Your physician recommends that you continue on your current medications as directed. Please refer to the Current Medication list given to you today.  *If you need a refill on your cardiac medications before your next appointment, please call your pharmacy*   Lab Work: Lipid and Liver in mid to late August.  You will need to be fasting for these labs (nothing to eat or drink after midnight except water and black  coffee).  If you have labs (blood work) drawn today and your tests are completely normal, you will receive your results only by: [place] (if you have MyChart) OR A paper copy in the mail If you have any lab test that is abnormal or we need to change your treatment, we will call you to review the results.   Testing/Procedures: None   Follow-Up: At [facility], you and your health needs are our priority.  As part of our continuing mission to provide you with exceptional heart care, we have created designated Provider Care Teams.  These Care Teams include your primary Cardiologist (physician) and Advanced Practice Providers (APPs -  Physician Assistants and Nurse Practitioners) who all work together to provide you with the care you need, when you need it.  We recommend signing up for the patient portal called "MyChart".  Sign up information is provided on this After Visit Summary.  MyChart is used to connect with patients for Virtual Visits (Telemedicine).  Patients are able to view lab/test results, encounter notes, upcoming appointments, etc.  Non-urgent messages can be sent to your provider as well.   To learn more about what you can do with MyChart, go to [email or web address].    Your next appointment:   1 year(s)  The format for your next appointment:   In Person  Provider:   You may see Dr. Daneen Schick or one of the following Advanced Practice Providers on your designated Care Team:   Cecilie Kicks, NP    Other Instructions     Signed, Sinclair Grooms, MD  09/03/2020 5:45 PM    [place]

## 2020-09-03 ENCOUNTER — Other Ambulatory Visit: Payer: Self-pay

## 2020-09-03 ENCOUNTER — Ambulatory Visit: Payer: PPO | Admitting: Interventional Cardiology

## 2020-09-03 ENCOUNTER — Encounter: Payer: Self-pay | Admitting: Interventional Cardiology

## 2020-09-03 VITALS — BP 138/72 | HR 84 | Ht 67.0 in | Wt 140.6 lb

## 2020-09-03 DIAGNOSIS — I10 Essential (primary) hypertension: Secondary | ICD-10-CM | POA: Diagnosis not present

## 2020-09-03 DIAGNOSIS — E785 Hyperlipidemia, unspecified: Secondary | ICD-10-CM

## 2020-09-03 DIAGNOSIS — I2510 Atherosclerotic heart disease of native coronary artery without angina pectoris: Secondary | ICD-10-CM | POA: Diagnosis not present

## 2020-09-03 DIAGNOSIS — Z8249 Family history of ischemic heart disease and other diseases of the circulatory system: Secondary | ICD-10-CM

## 2020-09-03 NOTE — Patient Instructions (Signed)
Medication Instructions:  Your physician recommends that you continue on your current medications as directed. Please refer to the Current Medication list given to you today.  *If you need a refill on your cardiac medications before your next appointment, please call your pharmacy*   Lab Work: Lipid and Liver in mid to late August.  You will need to be fasting for these labs (nothing to eat or drink after midnight except water and black coffee).  If you have labs (blood work) drawn today and your tests are completely normal, you will receive your results only by: [place] (if you have MyChart) OR A paper copy in the mail If you have any lab test that is abnormal or we need to change your treatment, we will call you to review the results.   Testing/Procedures: None   Follow-Up: At [facility], you and your health needs are our priority.  As part of our continuing mission to provide you with exceptional heart care, we have created designated Provider Care Teams.  These Care Teams include your primary Cardiologist (physician) and Advanced Practice Providers (APPs -  Physician Assistants and Nurse Practitioners) who all work together to provide you with the care you need, when you need it.  We recommend signing up for the patient portal called "MyChart".  Sign up information is provided on this After Visit Summary.  MyChart is used to connect with patients for Virtual Visits (Telemedicine).  Patients are able to view lab/test results, encounter notes, upcoming appointments, etc.  Non-urgent messages can be sent to your provider as well.   To learn more about what you can do with MyChart, go to [email or web address].    Your next appointment:   1 year(s)  The format for your next appointment:   In Person  Provider:   You may see Dr. Daneen Schick or one of the following Advanced Practice Providers on your designated Care Team:   Cecilie Kicks, NP    Other Instructions

## 2020-09-11 ENCOUNTER — Other Ambulatory Visit (HOSPITAL_COMMUNITY): Payer: Self-pay

## 2020-09-11 MED FILL — Losartan Potassium Tab 25 MG: ORAL | 90 days supply | Qty: 90 | Fill #1 | Status: AC

## 2020-09-11 MED FILL — Estrogens, Conjugated Tab 0.625 MG: ORAL | 90 days supply | Qty: 90 | Fill #1 | Status: AC

## 2020-09-18 DIAGNOSIS — M1711 Unilateral primary osteoarthritis, right knee: Secondary | ICD-10-CM | POA: Diagnosis not present

## 2020-09-24 ENCOUNTER — Other Ambulatory Visit (HOSPITAL_COMMUNITY): Payer: Self-pay

## 2020-09-24 MED FILL — Hydrochlorothiazide Tab 12.5 MG: ORAL | 90 days supply | Qty: 90 | Fill #1 | Status: AC

## 2020-09-27 ENCOUNTER — Other Ambulatory Visit: Payer: Self-pay

## 2020-09-27 ENCOUNTER — Other Ambulatory Visit: Payer: PPO | Admitting: *Deleted

## 2020-09-27 DIAGNOSIS — I10 Essential (primary) hypertension: Secondary | ICD-10-CM | POA: Diagnosis not present

## 2020-09-27 DIAGNOSIS — E785 Hyperlipidemia, unspecified: Secondary | ICD-10-CM

## 2020-09-27 LAB — LIPID PANEL
Chol/HDL Ratio: 2.3 ratio (ref 0.0–4.4)
Cholesterol, Total: 198 mg/dL (ref 100–199)
HDL: 87 mg/dL (ref >39)
LDL Chol Calc (NIH): 90 mg/dL (ref 0–99)
Triglycerides: 120 mg/dL (ref 0–149)
VLDL Cholesterol Cal: 21 mg/dL (ref 5–40)

## 2020-09-27 LAB — HEPATIC FUNCTION PANEL
ALT: 21 IU/L (ref 0–32)
AST: 25 IU/L (ref 0–40)
Albumin: 4.2 g/dL (ref 3.8–4.8)
Alkaline Phosphatase: 70 IU/L (ref 44–121)
Bilirubin Total: 0.3 mg/dL (ref 0.0–1.2)
Bilirubin, Direct: 0.13 mg/dL (ref 0.00–0.40)
Total Protein: 6.2 g/dL (ref 6.0–8.5)

## 2020-10-01 ENCOUNTER — Other Ambulatory Visit: Payer: Self-pay | Admitting: *Deleted

## 2020-10-01 ENCOUNTER — Other Ambulatory Visit (HOSPITAL_COMMUNITY): Payer: Self-pay

## 2020-10-01 DIAGNOSIS — E785 Hyperlipidemia, unspecified: Secondary | ICD-10-CM

## 2020-10-01 MED ORDER — ROSUVASTATIN CALCIUM 5 MG PO TABS
5.0000 mg | ORAL_TABLET | Freq: Every day | ORAL | 3 refills | Status: DC
  Filled 2020-10-01 – 2021-01-13 (×2): qty 90, 90d supply, fill #0
  Filled 2021-04-22: qty 90, 90d supply, fill #1
  Filled 2021-07-21: qty 90, 90d supply, fill #2

## 2020-10-02 ENCOUNTER — Other Ambulatory Visit (HOSPITAL_COMMUNITY): Payer: Self-pay

## 2020-11-08 ENCOUNTER — Other Ambulatory Visit (HOSPITAL_COMMUNITY): Payer: Self-pay

## 2020-11-08 DIAGNOSIS — L570 Actinic keratosis: Secondary | ICD-10-CM | POA: Diagnosis not present

## 2020-11-08 DIAGNOSIS — L309 Dermatitis, unspecified: Secondary | ICD-10-CM | POA: Diagnosis not present

## 2020-11-08 MED ORDER — TRIAMCINOLONE ACETONIDE 0.1 % EX CREA
TOPICAL_CREAM | Freq: Two times a day (BID) | CUTANEOUS | 1 refills | Status: DC
  Filled 2020-11-08: qty 45, 15d supply, fill #0

## 2020-11-13 ENCOUNTER — Other Ambulatory Visit: Payer: PPO

## 2020-12-02 ENCOUNTER — Other Ambulatory Visit (HOSPITAL_COMMUNITY): Payer: Self-pay

## 2020-12-02 MED ORDER — PENICILLIN V POTASSIUM 500 MG PO TABS
500.0000 mg | ORAL_TABLET | Freq: Three times a day (TID) | ORAL | 1 refills | Status: DC
  Filled 2020-12-02: qty 20, 7d supply, fill #0

## 2020-12-02 MED ORDER — DEXAMETHASONE 4 MG PO TABS
4.0000 mg | ORAL_TABLET | Freq: Two times a day (BID) | ORAL | 0 refills | Status: DC
  Filled 2020-12-02: qty 9, 3d supply, fill #0

## 2020-12-02 MED FILL — Losartan Potassium Tab 25 MG: ORAL | 90 days supply | Qty: 90 | Fill #2 | Status: AC

## 2020-12-03 ENCOUNTER — Other Ambulatory Visit (HOSPITAL_COMMUNITY): Payer: Self-pay

## 2020-12-09 ENCOUNTER — Other Ambulatory Visit: Payer: Self-pay | Admitting: Obstetrics and Gynecology

## 2020-12-09 DIAGNOSIS — Z01419 Encounter for gynecological examination (general) (routine) without abnormal findings: Secondary | ICD-10-CM

## 2020-12-09 MED ORDER — ESTROGENS CONJUGATED 0.625 MG PO TABS
1.2500 mg | ORAL_TABLET | Freq: Every day | ORAL | 0 refills | Status: DC
  Filled 2020-12-09 – 2020-12-10 (×2): qty 90, 45d supply, fill #0

## 2020-12-09 NOTE — Telephone Encounter (Signed)
Patient requested refill through My Chart. SHe included a message: "Patient comment: I will run out of Premarin RX before my appt. w/Dr Quincy Simmonds on 11/14. Please refill NINETY DAY supply.  Thank you !y"  Last AEX 12/26/19 Scheduled AEX 12/30/20 Normal Mammo 08/07/20

## 2020-12-10 ENCOUNTER — Other Ambulatory Visit (HOSPITAL_COMMUNITY): Payer: Self-pay

## 2020-12-10 ENCOUNTER — Other Ambulatory Visit: Payer: Self-pay | Admitting: Obstetrics and Gynecology

## 2020-12-10 DIAGNOSIS — Z01419 Encounter for gynecological examination (general) (routine) without abnormal findings: Secondary | ICD-10-CM

## 2020-12-10 MED ORDER — ESTROGENS CONJUGATED 0.625 MG PO TABS
0.6250 mg | ORAL_TABLET | Freq: Every day | ORAL | 0 refills | Status: DC
  Filled 2020-12-10: qty 90, 90d supply, fill #0

## 2020-12-10 NOTE — Progress Notes (Signed)
Prescription changed to Premarin 0.625 mg, take 1 tablet by mouth daily.  #90, RF none.

## 2020-12-11 ENCOUNTER — Other Ambulatory Visit (HOSPITAL_COMMUNITY): Payer: Self-pay

## 2020-12-11 ENCOUNTER — Other Ambulatory Visit: Payer: Self-pay | Admitting: *Deleted

## 2020-12-11 DIAGNOSIS — Z01419 Encounter for gynecological examination (general) (routine) without abnormal findings: Secondary | ICD-10-CM

## 2020-12-11 MED ORDER — ESTROGENS CONJUGATED 0.625 MG PO TABS
0.6250 mg | ORAL_TABLET | Freq: Every day | ORAL | 0 refills | Status: DC
  Filled 2020-12-11: qty 90, 90d supply, fill #0

## 2020-12-11 NOTE — Telephone Encounter (Signed)
Per pharmacy they cannot see previous prescription asked [place] to resend in mediation

## 2020-12-19 DIAGNOSIS — D220 Melanocytic nevi of lip: Secondary | ICD-10-CM | POA: Diagnosis not present

## 2020-12-19 DIAGNOSIS — L578 Other skin changes due to chronic exposure to nonionizing radiation: Secondary | ICD-10-CM | POA: Diagnosis not present

## 2020-12-19 DIAGNOSIS — L821 Other seborrheic keratosis: Secondary | ICD-10-CM | POA: Diagnosis not present

## 2020-12-19 DIAGNOSIS — L570 Actinic keratosis: Secondary | ICD-10-CM | POA: Diagnosis not present

## 2020-12-19 DIAGNOSIS — D225 Melanocytic nevi of trunk: Secondary | ICD-10-CM | POA: Diagnosis not present

## 2020-12-19 DIAGNOSIS — Z23 Encounter for immunization: Secondary | ICD-10-CM | POA: Diagnosis not present

## 2020-12-19 DIAGNOSIS — L814 Other melanin hyperpigmentation: Secondary | ICD-10-CM | POA: Diagnosis not present

## 2020-12-30 ENCOUNTER — Encounter: Payer: Self-pay | Admitting: Obstetrics and Gynecology

## 2020-12-30 ENCOUNTER — Other Ambulatory Visit (HOSPITAL_COMMUNITY): Payer: Self-pay

## 2020-12-30 ENCOUNTER — Ambulatory Visit (INDEPENDENT_AMBULATORY_CARE_PROVIDER_SITE_OTHER): Payer: PPO | Admitting: Obstetrics and Gynecology

## 2020-12-30 ENCOUNTER — Other Ambulatory Visit (HOSPITAL_COMMUNITY)
Admission: RE | Admit: 2020-12-30 | Discharge: 2020-12-30 | Disposition: A | Discharge status: Home / Self Care | Payer: PPO | Source: Ambulatory Visit | Attending: Obstetrics and Gynecology | Admitting: Obstetrics and Gynecology

## 2020-12-30 ENCOUNTER — Other Ambulatory Visit: Payer: Self-pay

## 2020-12-30 VITALS — BP 138/70 | HR 76 | Ht 66.5 in | Wt 140.0 lb

## 2020-12-30 DIAGNOSIS — Z87411 Personal history of vaginal dysplasia: Secondary | ICD-10-CM | POA: Insufficient documentation

## 2020-12-30 DIAGNOSIS — Z1151 Encounter for screening for human papillomavirus (HPV): Secondary | ICD-10-CM | POA: Insufficient documentation

## 2020-12-30 DIAGNOSIS — R87612 Low grade squamous intraepithelial lesion on cytologic smear of cervix (LGSIL): Secondary | ICD-10-CM | POA: Insufficient documentation

## 2020-12-30 DIAGNOSIS — R87810 Cervical high risk human papillomavirus (HPV) DNA test positive: Secondary | ICD-10-CM | POA: Insufficient documentation

## 2020-12-30 DIAGNOSIS — Z01419 Encounter for gynecological examination (general) (routine) without abnormal findings: Secondary | ICD-10-CM | POA: Diagnosis not present

## 2020-12-30 DIAGNOSIS — Z9289 Personal history of other medical treatment: Secondary | ICD-10-CM | POA: Diagnosis not present

## 2020-12-30 MED ORDER — ESTROGENS CONJUGATED 0.625 MG PO TABS
0.6250 mg | ORAL_TABLET | Freq: Every day | ORAL | 3 refills | Status: DC
  Filled 2020-12-30 – 2021-03-10 (×2): qty 90, 90d supply, fill #0

## 2020-12-30 NOTE — Progress Notes (Signed)
65 y.o. G30P2002 Divorced Caucasian female here for annual exam.    Patient is followed for VAIN II which she has chosen not to treat.  Her last colposcopy was done 03/15/20 which showed VAIN II.  Her colposcopy preceding this was LGSIL and positive HR HPV. Patient has had consultation with Dr. Everitt Amber of [place].   She is also followed for ERT. Working well for her, and she wants to continue.   PCP: Kelton Pillar, MD    Patient's last menstrual period was 02/02/2011.           Sexually active: No.  The current method of family planning is status post hysterectomy.    Exercising: Yes.     Water aerobics, walking Smoker:  no  Health Maintenance: Pap:  12-26-19 LGSIL:Pos HR HPV, 12-15-17 Vag.pap ASCUS:Pos HR HPV,11-18-16 Vag.pap ASCUS:Pos HR HPV, 04-29-16 Vag pap Neg:Pos HR HPV;neg 16/18/45, 04-17-15 LGSIL:Pos HR HPV History of abnormal Pap:  yes, SEE ABOVE.  Prior colpo biopsy showing VAIN II.  Patient has had GYN ONC consultation and chosen to do observational management.  MMG: 08-07-20 3D/Neg/BiRads1 Colonoscopy:11/09/16 Polyp removed f/u 10 years BMD:   04-13-18  Result :osteopenia TDaP: up to date Gardasil:   no HIV: donated blood in the past Hep C: donated blood in the past Screening Labs:  PCP Covid:  received one booster. Flu vaccine:  completed.   reports that she has never smoked. She has never used smokeless tobacco. She reports current alcohol use of about 10.0 - 12.0 standard drinks per week. She reports that she does not use drugs.  Past Medical History:  Diagnosis Date   Allergy    Anxiety    Through divorce   Arthritis    ASCUS with positive high risk HPV 12/2006   Negative Colpo BX   ASCUS with positive high risk HPV 2009   Chronic kidney disease    kidney stones   CIN III (cervical intraepithelial neoplasia III) 07/1983   tx'd w/cryo- no subsequent abnormal paps until 2008   Essential hypertension    Fibroid 2006   3 cm Right Fundal Fibroid   GERD  (gastroesophageal reflux disease) 2011   History of kidney stones    Hypertension    Osteopenia 2020   Synovial cyst of lumbar spine 10/2014   Vitamin D deficiency     Past Surgical History:  Procedure Laterality Date   ABDOMINAL HYSTERECTOMY  02/23/11   R TLH-fibroids, adenomyosis 221 g, & focal CIN I w/clear margins   ANTERIOR CERVICAL DECOMP/DISCECTOMY FUSION N/A 07/19/2012   Procedure: ANTERIOR CERVICAL DECOMPRESSION/DISCECTOMY FUSION 2 LEVELS;  Surgeon: Erline Levine, MD;  Location: [facility];  Service: Neurosurgery;  Laterality: N/A;  Anterior Cervical Five-Six Cervical Six-Seven Anterior Cervical Decompression/Diskectomy/Fusion   COLONOSCOPY     CYSTOSCOPY  02/23/2011   PT.STATES SHE DID NOT HAVE   dental implants     LASIK  '99   LUMBAR LAMINECTOMY/DECOMPRESSION MICRODISCECTOMY Right 02/05/2015   Procedure: Right Lumbar four-five Laminectomy with Resection of Synovial Cyst ;  Surgeon: Erline Levine, MD;  Location: [facility];  Service: Neurosurgery;  Laterality: Right;   TONSILLECTOMY AND ADENOIDECTOMY     VAGINAL DELIVERY  '92 '94    Current Outpatient Medications  Medication Sig Dispense Refill   Calcium Carb-Cholecalciferol (CALCIUM 500+D3 PO) Take 1 tablet by mouth daily.     estrogens, conjugated, (PREMARIN) 0.625 MG tablet Take 1 tablet (0.625 mg total) by mouth daily. 90 tablet 0   hydrochlorothiazide (  HYDRODIURIL) 12.5 MG tablet TAKE 1 TABLET BY MOUTH ONCE A DAY IN THE MORNING 90 tablet 3   losartan (COZAAR) 25 MG tablet Take 1 tablet (25 mg total) by mouth daily. 90 tablet 3   Multiple Vitamins-Minerals (MULTIVITAMINS THER. W/MINERALS) TABS Take 1 tablet by mouth daily.     rosuvastatin (CRESTOR) 5 MG tablet Take 1 tablet (5 mg total) by mouth daily. 90 tablet 3   No current facility-administered medications for this visit.    Family History  Problem Relation Age of Onset   Hypertension Mother    Heart disease Mother    Cancer Mother        lung   Hypertension  Father    Heart attack Father    Colon cancer Neg Hx    Esophageal cancer Neg Hx    Pancreatic cancer Neg Hx    Prostate cancer Neg Hx    Rectal cancer Neg Hx    Stomach cancer Neg Hx    Breast cancer Neg Hx     Review of Systems  All other systems reviewed and are negative.  Exam:   BP 138/70   Pulse 76   Ht 5' 6.5" (1.689 m)   Wt 140 lb (63.5 kg)   LMP 02/02/2011   SpO2 100%   BMI 22.26 kg/m     General appearance: alert, cooperative and appears stated age Head: normocephalic, without obvious abnormality, atraumatic Neck: no adenopathy, supple, symmetrical, trachea midline and thyroid normal to inspection and palpation Lungs: clear to auscultation bilaterally Breasts: normal appearance, no masses or tenderness, No nipple retraction or dimpling, No nipple discharge or bleeding, No axillary adenopathy Heart: regular rate and rhythm Abdomen: soft, non-tender; no masses, no organomegaly Extremities: extremities normal, atraumatic, no cyanosis or edema Skin: skin color, texture, turgor normal. No rashes or lesions Lymph nodes: cervical, supraclavicular, and axillary nodes normal. Neurologic: grossly normal  Pelvic: External genitalia:  no lesions              No abnormal inguinal nodes palpated.              Urethra:  normal appearing urethra with no masses, tenderness or lesions              Bartholins and Skenes: normal                 Vagina: normal appearing vagina with normal color and discharge, no lesions              Cervix: absent              Pap taken: yes Bimanual Exam:  Uterus:  normal size, contour, position, consistency, mobility, non-tender              Adnexa: no mass, fullness, tenderness              Rectal exam: Yes.  .  Confirms.              Anus:  normal sphincter tone, no lesions  Chaperone was present for exam:  Estill Bamberg, CMA  Assessment:   Well woman visit with gynecologic exam. Status post robotic TLH.  Tubes and ovaries remain.  Hx CIN III.   Long standing VAIN II.  Not treated.  Oncology recommending pap and HR HPV yearly.  Osteopenia.  PCP following.  ERT.  Plan: Mammogram screening discussed. Self breast awareness reviewed. Pap and HR HPV as above.  I did discuss with her colposcopy as a way  to make a diagnosis of vaginal dysplasia and that a pap is only a screening tool.    Guidelines for Calcium, Vitamin D, regular exercise program including cardiovascular and weight bearing exercise. Refill of Premarin 0.625 mg daily.  #90, RF 3.  I discussed increase risk of stroke, PE, and DVT with ERT.  She wishes to continue.  BMD due with PCP.  Follow up annually and prn.    After visit summary provided.   30 min  total time was spent for this patient encounter, including preparation, face-to-face counseling with the patient, coordination of care, and documentation of the encounter.

## 2020-12-30 NOTE — Patient Instructions (Signed)

## 2020-12-30 NOTE — Progress Notes (Deleted)
GYNECOLOGY  VISIT   HPI: 65 y.o.   Divorced  Caucasian  female   G2P2002 with Patient's last menstrual period was 02/02/2011.   here for     GYNECOLOGIC HISTORY: Patient's last menstrual period was 02/02/2011. Contraception:  *** Menopausal hormone therapy:  *** Last mammogram:  *** Last pap smear:   ***        OB History     Gravida  2   Para  2   Term  2   Preterm      AB      Living  2      SAB      IAB      Ectopic      Multiple      Live Births  2              Patient Active Problem List   Diagnosis Date Noted   VAIN II (vaginal intraepithelial neoplasia grade II) 05/20/2015   Synovial cyst of lumbar facet joint 02/05/2015   History of abnormal Pap smear 12/30/2012   Hypertension    GERD (gastroesophageal reflux disease)     Past Medical History:  Diagnosis Date   Allergy    Anxiety    Through divorce   Arthritis    ASCUS with positive high risk HPV 12/2006   Negative Colpo BX   ASCUS with positive high risk HPV 2009   Chronic kidney disease    kidney stones   CIN III (cervical intraepithelial neoplasia III) 07/1983   tx'd w/cryo- no subsequent abnormal paps until 2008   Essential hypertension    Fibroid 2006   3 cm Right Fundal Fibroid   GERD (gastroesophageal reflux disease) 2011   History of kidney stones    Hypertension    Osteopenia 2020   Synovial cyst of lumbar spine 10/2014   Vitamin D deficiency     Past Surgical History:  Procedure Laterality Date   ABDOMINAL HYSTERECTOMY  02/23/11   R TLH-fibroids, adenomyosis 221 g, & focal CIN I w/clear margins   ANTERIOR CERVICAL DECOMP/DISCECTOMY FUSION N/A 07/19/2012   Procedure: ANTERIOR CERVICAL DECOMPRESSION/DISCECTOMY FUSION 2 LEVELS;  Surgeon: Erline Levine, MD;  Location: [facility];  Service: Neurosurgery;  Laterality: N/A;  Anterior Cervical Five-Six Cervical Six-Seven Anterior Cervical Decompression/Diskectomy/Fusion   COLONOSCOPY     CYSTOSCOPY  02/23/2011   PT.STATES SHE  DID NOT HAVE   dental implants     LASIK  '99   LUMBAR LAMINECTOMY/DECOMPRESSION MICRODISCECTOMY Right 02/05/2015   Procedure: Right Lumbar four-five Laminectomy with Resection of Synovial Cyst ;  Surgeon: Erline Levine, MD;  Location: [facility];  Service: Neurosurgery;  Laterality: Right;   TONSILLECTOMY AND ADENOIDECTOMY     VAGINAL DELIVERY  '92 '94    Current Outpatient Medications  Medication Sig Dispense Refill   Calcium Carb-Cholecalciferol (CALCIUM 500+D3 PO) Take 1 tablet by mouth daily.     calcium carbonate (TUMS - DOSED IN MG ELEMENTAL CALCIUM) 500 MG chewable tablet 1-2  As needed     cyclobenzaprine (FLEXERIL) 10 MG tablet Take 5 mg by mouth 3 (three) times daily as needed for muscle spasms.     dexamethasone (DECADRON) 4 MG tablet Take 1 tablet (4 mg total) by mouth 2 (two) times daily. 9 tablet 0   estrogens, conjugated, (PREMARIN) 0.625 MG tablet Take 1 tablet (0.625 mg total) by mouth daily. 90 tablet 0   hydrochlorothiazide (HYDRODIURIL) 12.5 MG tablet TAKE 1 TABLET BY MOUTH ONCE A  DAY IN THE MORNING 90 tablet 3   hydrochlorothiazide (MICROZIDE) 12.5 MG capsule Take 12.5 mg by mouth daily.     losartan (COZAAR) 25 MG tablet Take 1 tablet (25 mg total) by mouth daily. 90 tablet 3   Multiple Vitamins-Minerals (MULTIVITAMINS THER. W/MINERALS) TABS Take 1 tablet by mouth daily.     penicillin v potassium (VEETID) 500 MG tablet Take 1 tablet (500 mg total) by mouth every 8 (eight) hours until gone. 20 tablet 1   rosuvastatin (CRESTOR) 5 MG tablet Take 1 tablet (5 mg total) by mouth daily. 90 tablet 3   triamcinolone cream (KENALOG) 0.1 % Apply 1 application topically 2 (two) times daily. 45 g 1   No current facility-administered medications for this visit.     ALLERGIES: Codeine and Ultram [tramadol]  Family History  Problem Relation Age of Onset   Hypertension Mother    Heart disease Mother    Cancer Mother        lung   Hypertension Father    Heart attack Father     Colon cancer Neg Hx    Esophageal cancer Neg Hx    Pancreatic cancer Neg Hx    Prostate cancer Neg Hx    Rectal cancer Neg Hx    Stomach cancer Neg Hx    Breast cancer Neg Hx     Social History   Socioeconomic History   Marital status: Divorced    Spouse name: Not on file   Number of children: 2   Years of education: Not on file   Highest education level: Not on file  Occupational History   Occupation: Programmer, multimedia: [employer]  Tobacco Use   Smoking status: Never   Smokeless tobacco: Never  Vaping Use   Vaping Use: Never used  Substance and Sexual Activity   Alcohol use: Yes    Alcohol/week: 10.0 - 12.0 standard drinks    Types: 10 - 12 Glasses of wine per week    Comment: 10-12 glasses of wine a week   Drug use: No   Sexual activity: Not Currently    Partners: Male    Birth control/protection: Post-menopausal, Surgical    Comment: R-TLH  Other Topics Concern   Not on file  Social History Narrative   Not on file   Social Determinants of Health   Financial Resource Strain: Not on file  Food Insecurity: Not on file  Transportation Needs: Not on file  Physical Activity: Not on file  Stress: Not on file  Social Connections: Not on file  Intimate Partner Violence: Not on file    Review of Systems  PHYSICAL EXAMINATION:    LMP 02/02/2011     General appearance: alert, cooperative and appears stated age Head: Normocephalic, without obvious abnormality, atraumatic Neck: no adenopathy, supple, symmetrical, trachea midline and thyroid normal to inspection and palpation Lungs: clear to auscultation bilaterally Breasts: normal appearance, no masses or tenderness, No nipple retraction or dimpling, No nipple discharge or bleeding, No axillary or supraclavicular adenopathy Heart: regular rate and rhythm Abdomen: soft, non-tender, no masses,  no organomegaly Extremities: extremities normal, atraumatic, no cyanosis or edema Skin: Skin color, texture, turgor  normal. No rashes or lesions Lymph nodes: Cervical, supraclavicular, and axillary nodes normal. No abnormal inguinal nodes palpated Neurologic: Grossly normal  Pelvic: External genitalia:  no lesions              Urethra:  normal appearing urethra with no masses, tenderness or lesions  Bartholins and Skenes: normal                 Vagina: normal appearing vagina with normal color and discharge, no lesions              Cervix: no lesions                Bimanual Exam:  Uterus:  normal size, contour, position, consistency, mobility, non-tender              Adnexa: no mass, fullness, tenderness              Rectal exam: {yes no:314532}.  Confirms.              Anus:  normal sphincter tone, no lesions  Chaperone was present for exam:  ***  ASSESSMENT     PLAN     An After Visit Summary was printed and given to the patient.  ______ minutes face to face time of which over 50% was spent in counseling.

## 2021-01-06 DIAGNOSIS — M25562 Pain in left knee: Secondary | ICD-10-CM | POA: Diagnosis not present

## 2021-01-06 DIAGNOSIS — M1711 Unilateral primary osteoarthritis, right knee: Secondary | ICD-10-CM | POA: Diagnosis not present

## 2021-01-07 ENCOUNTER — Telehealth: Payer: Self-pay

## 2021-01-07 ENCOUNTER — Other Ambulatory Visit: Payer: Self-pay

## 2021-01-07 LAB — CYTOLOGY - PAP
Comment: NEGATIVE
High risk HPV: POSITIVE — AB

## 2021-01-07 NOTE — Telephone Encounter (Signed)
-----   Message from Nunzio Cobbs, MD sent at 01/07/2021  3:18 PM EST ----- Please contact patient with results of her pap showing LGSIL of the vagina and positive HR HPV.  I recommend colposcopy with me.  The pap is a screening test and does not determine level of dysplasia.

## 2021-01-07 NOTE — Telephone Encounter (Signed)
Patient was informed of result note info. She asked me to send your message to her in My Chart. She said she will decide and does not want to do anything until after the holidays.  She asked if I would look back through her result history and let her know if this is the same thing she has been dealing with for a long time.  I was not comfortable doing that and told her I would ask Dr. Quincy Simmonds and let her know.

## 2021-01-11 NOTE — Telephone Encounter (Signed)
Patient is in pap recall for January, 2023.

## 2021-01-13 ENCOUNTER — Other Ambulatory Visit (HOSPITAL_COMMUNITY): Payer: Self-pay

## 2021-01-13 ENCOUNTER — Telehealth: Payer: Self-pay

## 2021-01-13 NOTE — Telephone Encounter (Signed)
-----   Message from Nunzio Cobbs, MD sent at 01/11/2021 11:29 AM EST ----- Please place patient in recall for January, 2023.

## 2021-01-13 NOTE — Telephone Encounter (Signed)
Dr. Quincy Simmonds, Recall letters for January have already been printed and mailed out and they are getting ready to print Feb.  Is patient aware of your recommendation to just repeat Pap smear in January?  We can just call her now and get her schedule versus recall letter. OK?

## 2021-01-13 NOTE — Telephone Encounter (Signed)
I addended the original message with this info. This encounter closed.

## 2021-01-13 NOTE — Telephone Encounter (Signed)
Dr. Quincy Simmonds, Recall letters for January have already been printed and mailed out and they are getting ready to print Feb.   Is patient aware of your recommendation to just repeat Pap smear in January?  We can just call her now and get her schedule versus recall letter. OK?

## 2021-01-14 NOTE — Telephone Encounter (Signed)
Called patient and left message in voice mail that Dr. Quincy Simmonds recommended PAPR in January and I will have someone call her to schedule visit.

## 2021-01-15 ENCOUNTER — Other Ambulatory Visit (HOSPITAL_COMMUNITY): Payer: Self-pay

## 2021-01-15 MED ORDER — HYDROCHLOROTHIAZIDE 12.5 MG PO TABS
12.5000 mg | ORAL_TABLET | Freq: Every morning | ORAL | 0 refills | Status: DC
  Filled 2021-01-15: qty 90, 90d supply, fill #0

## 2021-01-20 DIAGNOSIS — E559 Vitamin D deficiency, unspecified: Secondary | ICD-10-CM | POA: Diagnosis not present

## 2021-01-20 DIAGNOSIS — I2510 Atherosclerotic heart disease of native coronary artery without angina pectoris: Secondary | ICD-10-CM | POA: Diagnosis not present

## 2021-01-20 DIAGNOSIS — Z1389 Encounter for screening for other disorder: Secondary | ICD-10-CM | POA: Diagnosis not present

## 2021-01-20 DIAGNOSIS — M8580 Other specified disorders of bone density and structure, unspecified site: Secondary | ICD-10-CM | POA: Diagnosis not present

## 2021-01-20 DIAGNOSIS — Z23 Encounter for immunization: Secondary | ICD-10-CM | POA: Diagnosis not present

## 2021-01-20 DIAGNOSIS — N890 Mild vaginal dysplasia: Secondary | ICD-10-CM | POA: Diagnosis not present

## 2021-01-20 DIAGNOSIS — K219 Gastro-esophageal reflux disease without esophagitis: Secondary | ICD-10-CM | POA: Diagnosis not present

## 2021-01-20 DIAGNOSIS — I10 Essential (primary) hypertension: Secondary | ICD-10-CM | POA: Diagnosis not present

## 2021-01-20 DIAGNOSIS — E7800 Pure hypercholesterolemia, unspecified: Secondary | ICD-10-CM | POA: Diagnosis not present

## 2021-01-20 DIAGNOSIS — Z0000 Encounter for general adult medical examination without abnormal findings: Secondary | ICD-10-CM | POA: Diagnosis not present

## 2021-01-22 NOTE — Telephone Encounter (Signed)
Appointment desk has left message for patient to call.

## 2021-01-23 ENCOUNTER — Other Ambulatory Visit: Payer: Self-pay | Admitting: Family Medicine

## 2021-01-23 DIAGNOSIS — Z1231 Encounter for screening mammogram for malignant neoplasm of breast: Secondary | ICD-10-CM

## 2021-01-23 DIAGNOSIS — M8588 Other specified disorders of bone density and structure, other site: Secondary | ICD-10-CM

## 2021-02-13 ENCOUNTER — Telehealth: Payer: Self-pay

## 2021-02-13 NOTE — Telephone Encounter (Signed)
Opened in error

## 2021-02-16 HISTORY — PX: MOLE REMOVAL: SHX2046

## 2021-02-16 HISTORY — PX: OTHER SURGICAL HISTORY: SHX169

## 2021-02-19 ENCOUNTER — Encounter: Payer: Self-pay | Admitting: Interventional Cardiology

## 2021-02-19 DIAGNOSIS — I2510 Atherosclerotic heart disease of native coronary artery without angina pectoris: Secondary | ICD-10-CM

## 2021-02-19 DIAGNOSIS — E785 Hyperlipidemia, unspecified: Secondary | ICD-10-CM

## 2021-02-27 NOTE — Telephone Encounter (Signed)
Thamas Jaegers, [place]  02/19/2021  2:30 PM EST Back to Top    Patient called back and said she received [facility] and understood, however she is going to follow up with Dr.Alexi Dorminey Berline Lopes.

## 2021-03-10 ENCOUNTER — Encounter: Payer: Self-pay | Admitting: Obstetrics and Gynecology

## 2021-03-10 ENCOUNTER — Other Ambulatory Visit (HOSPITAL_COMMUNITY): Payer: Self-pay

## 2021-03-10 MED FILL — Losartan Potassium Tab 25 MG: ORAL | 60 days supply | Qty: 60 | Fill #3 | Status: AC

## 2021-03-11 ENCOUNTER — Encounter: Payer: Self-pay | Admitting: Interventional Cardiology

## 2021-03-11 ENCOUNTER — Other Ambulatory Visit: Payer: Self-pay

## 2021-03-11 ENCOUNTER — Other Ambulatory Visit (HOSPITAL_COMMUNITY): Payer: Self-pay

## 2021-03-11 NOTE — Telephone Encounter (Signed)
I would recommend Estradiol 0.5 mg daily.   This will be the most comparable with her current Premarin prescription.   She can have refills until her exam is due in November.

## 2021-03-11 NOTE — Telephone Encounter (Signed)
I spoke with patient today because she called about a different matter and while I had her I asked her about the colposcopy follow up from her Pap smear two months ago. She said she called about seeing Dr. Berline Lopes and I told her I did have that message but did not see where she had scheduled appointment. She said she will be calling them soon and following up there.

## 2021-03-11 NOTE — Telephone Encounter (Signed)
I called and spoke with patient. She wants to see if there is something most similar to Premarin that might be less in cost that she could take. She said the Premarin works very well for her. She states she does not want a cream or a patch because she swims a lot. She is e-mailing me her formulary of hormones.  I reviewed the formulary and the only oral estrogen that is lower than Tier 4 is Estradiol 0.5mg , 1 mg and 2 mg( Tier 2).

## 2021-03-12 ENCOUNTER — Other Ambulatory Visit: Payer: Self-pay

## 2021-03-12 ENCOUNTER — Other Ambulatory Visit (HOSPITAL_COMMUNITY): Payer: Self-pay

## 2021-03-12 MED ORDER — ESTRADIOL 0.5 MG PO TABS
0.5000 mg | ORAL_TABLET | Freq: Every day | ORAL | 2 refills | Status: DC
  Filled 2021-03-12: qty 90, 90d supply, fill #0
  Filled 2021-06-15: qty 90, 90d supply, fill #1
  Filled 2021-09-09: qty 90, 90d supply, fill #2

## 2021-04-10 ENCOUNTER — Ambulatory Visit: Payer: PPO | Admitting: Gastroenterology

## 2021-04-10 ENCOUNTER — Other Ambulatory Visit: Payer: Self-pay

## 2021-04-10 ENCOUNTER — Encounter: Payer: Self-pay | Admitting: Gastroenterology

## 2021-04-10 ENCOUNTER — Other Ambulatory Visit (HOSPITAL_COMMUNITY): Payer: Self-pay

## 2021-04-10 ENCOUNTER — Other Ambulatory Visit: Payer: PPO

## 2021-04-10 VITALS — BP 122/80 | HR 75 | Ht 66.5 in | Wt 137.0 lb

## 2021-04-10 DIAGNOSIS — E785 Hyperlipidemia, unspecified: Secondary | ICD-10-CM | POA: Diagnosis not present

## 2021-04-10 DIAGNOSIS — K219 Gastro-esophageal reflux disease without esophagitis: Secondary | ICD-10-CM

## 2021-04-10 DIAGNOSIS — K582 Mixed irritable bowel syndrome: Secondary | ICD-10-CM | POA: Diagnosis not present

## 2021-04-10 DIAGNOSIS — R109 Unspecified abdominal pain: Secondary | ICD-10-CM | POA: Diagnosis not present

## 2021-04-10 DIAGNOSIS — I2510 Atherosclerotic heart disease of native coronary artery without angina pectoris: Secondary | ICD-10-CM | POA: Diagnosis not present

## 2021-04-10 DIAGNOSIS — I10 Essential (primary) hypertension: Secondary | ICD-10-CM | POA: Diagnosis not present

## 2021-04-10 DIAGNOSIS — R7309 Other abnormal glucose: Secondary | ICD-10-CM | POA: Diagnosis not present

## 2021-04-10 DIAGNOSIS — K581 Irritable bowel syndrome with constipation: Secondary | ICD-10-CM

## 2021-04-10 MED ORDER — OMEPRAZOLE 20 MG PO CPDR
20.0000 mg | DELAYED_RELEASE_CAPSULE | Freq: Every day | ORAL | 3 refills | Status: DC
  Filled 2021-04-10: qty 90, 90d supply, fill #0

## 2021-04-10 MED ORDER — HYOSCYAMINE SULFATE SL 0.125 MG SL SUBL
1.0000 | SUBLINGUAL_TABLET | Freq: Three times a day (TID) | SUBLINGUAL | 3 refills | Status: DC | PRN
  Filled 2021-04-10: qty 90, 30d supply, fill #0

## 2021-04-10 NOTE — Progress Notes (Signed)
Debra Nguyen    [id number]    05/16/1955  Primary Care Physician:Griffin, Margaretha Sheffield, MD  Referring Physician: Kelton Pillar, MD [place] [employer] [place],  [place] [address]   Chief complaint:  GERD, Diarrhea  HPI:  66 year old very pleasant female here for follow-up visit for IBS diarrhea and constipation She was having episodes of diarrhea sometime around November and December, improved with dietary changes and she is taking Benefiber twice daily.  Denies any rectal bleeding or melena.  EGD November 23, 2018: LA grade B erosive esophagitis biopsy showed increased intraepithelial eosinophils in the distal esophagus biopsies, gastritis, gastric biopsies negative for H. Pylori   She took omeprazole 20 mg daily for 3 months, overall significant improvement of GERD and dyspepsia symptoms   Colonoscopy November 09, 2016: 2 mm adenomatous polyp removed from cecum, hypertrophied anal papilla otherwise unremarkable exam   Outpatient Encounter Medications as of 04/10/2021  Medication Sig   Calcium Carb-Cholecalciferol (CALCIUM 500+D3 PO) Take 1 tablet by mouth daily.   estradiol (ESTRACE) 0.5 MG tablet Take 1 tablet (0.5 mg total) by mouth daily.   hydrochlorothiazide (HYDRODIURIL) 12.5 MG tablet Take 1 tablet (12.5 mg total) by mouth in the morning.   losartan (COZAAR) 25 MG tablet Take 1 tablet (25 mg total) by mouth daily.   Multiple Vitamins-Minerals (MULTIVITAMINS THER. W/MINERALS) TABS Take 1 tablet by mouth daily.   rosuvastatin (CRESTOR) 5 MG tablet Take 1 tablet (5 mg total) by mouth daily.   Wheat Dextrin (BENEFIBER PO) Take by mouth. Takes two teas spoon daily   [DISCONTINUED] hydrochlorothiazide (HYDRODIURIL) 12.5 MG tablet TAKE 1 TABLET BY MOUTH ONCE A DAY IN THE MORNING   No facility-administered encounter medications on file as of 04/10/2021.    Allergies as of 04/10/2021 - Review Complete 04/10/2021  Allergen Reaction Noted   Codeine Itching  02/02/2011   Ultram [tramadol] Itching 12/27/2012    Past Medical History:  Diagnosis Date   Allergy    Anxiety    Through divorce   Arthritis    ASCUS with positive high risk HPV 12/2006   Negative Colpo BX   ASCUS with positive high risk HPV 2009   Chronic kidney disease    kidney stones   CIN III (cervical intraepithelial neoplasia III) 07/1983   tx'd w/cryo- no subsequent abnormal paps until 2008   Essential hypertension    Fibroid 2006   3 cm Right Fundal Fibroid   GERD (gastroesophageal reflux disease) 2011   History of kidney stones    Hypertension    Osteopenia 2020   Synovial cyst of lumbar spine 10/2014   Vitamin D deficiency     Past Surgical History:  Procedure Laterality Date   ABDOMINAL HYSTERECTOMY  02/23/11   R TLH-fibroids, adenomyosis 221 g, & focal CIN I w/clear margins   ANTERIOR CERVICAL DECOMP/DISCECTOMY FUSION N/A 07/19/2012   Procedure: ANTERIOR CERVICAL DECOMPRESSION/DISCECTOMY FUSION 2 LEVELS;  Surgeon: Erline Levine, MD;  Location: [facility];  Service: Neurosurgery;  Laterality: N/A;  Anterior Cervical Five-Six Cervical Six-Seven Anterior Cervical Decompression/Diskectomy/Fusion   COLONOSCOPY     CYSTOSCOPY  02/23/2011   PT.STATES SHE DID NOT HAVE   dental implants     LASIK  '99   LUMBAR LAMINECTOMY/DECOMPRESSION MICRODISCECTOMY Right 02/05/2015   Procedure: Right Lumbar four-five Laminectomy with Resection of Synovial Cyst ;  Surgeon: Erline Levine, MD;  Location: [place] [facility];  Service: Neurosurgery;  Laterality: Right;  TONSILLECTOMY AND ADENOIDECTOMY     VAGINAL DELIVERY  '92 '94    Family History  Problem Relation Age of Onset   Hypertension Mother    Heart disease Mother    Cancer Mother        lung   Hypertension Father    Heart attack Father    Colon cancer Neg Hx    Esophageal cancer Neg Hx    Pancreatic cancer Neg Hx    Prostate cancer Neg Hx    Rectal cancer Neg Hx    Stomach cancer Neg Hx    Breast cancer Neg Hx      Social History   Socioeconomic History   Marital status: Divorced    Spouse name: Not on file   Number of children: 2   Years of education: Not on file   Highest education level: Not on file  Occupational History   Occupation: Programmer, multimedia: [employer]  Tobacco Use   Smoking status: Never   Smokeless tobacco: Never  Vaping Use   Vaping Use: Never used  Substance and Sexual Activity   Alcohol use: Yes    Alcohol/week: 10.0 - 12.0 standard drinks    Types: 10 - 12 Glasses of wine per week    Comment: 10-12 glasses of wine a week   Drug use: No   Sexual activity: Not Currently    Partners: Male    Birth control/protection: Post-menopausal, Surgical    Comment: R-TLH  Other Topics Concern   Not on file  Social History Narrative   Not on file   Social Determinants of Health   Financial Resource Strain: Not on file  Food Insecurity: Not on file  Transportation Needs: Not on file  Physical Activity: Not on file  Stress: Not on file  Social Connections: Not on file  Intimate Partner Violence: Not on file      Review of systems: All other review of systems negative except as mentioned in the HPI.   Physical Exam: Vitals:   04/10/21 0918  BP: 122/80  Pulse: 75  SpO2: 98%   Body mass index is 21.78 kg/m. Gen:      No acute distress HEENT:  sclera anicteric Abd:      soft, non-tender; no palpable masses, no distension Ext:    No edema Neuro: alert and oriented x 3 Psych: normal mood and affect  Data Reviewed:  Reviewed labs, radiology imaging, old records and pertinent past GI work up   Assessment and Plan/Recommendations:  66 year old very pleasant female here for follow-up visit for IBS  and abdominal cramping  IBS constipation and diarrhea: Continue with dietary modifications Use Benefiber 1 tablespoon 2-3 times daily with meals  Use Levsin up to 3 times daily as needed for severe abdominal cramping  GERD: Continue with dietary changes and  lifestyle modifications Antireflux measures Use low-dose omeprazole 20 mg daily as needed for breakthrough heartburn  Return in 1 year or sooner if needed  This visit required >40 minutes of patient care (this includes precharting, chart review, review of results, face-to-face time used for counseling as well as treatment plan and follow-up. The patient was provided an opportunity to ask questions and all were answered. The patient agreed with the plan and demonstrated an understanding of the instructions.  Damaris Hippo , MD    CC: Kelton Pillar, MD

## 2021-04-10 NOTE — Patient Instructions (Addendum)
If you are age 68 or older, your body mass index should be between 23-30. Your Body mass index is 21.78 kg/m. If this is out of the aforementioned range listed, please consider follow up with your Primary Care Provider.  If you are age 70 or younger, your body mass index should be between 19-25. Your Body mass index is 21.78 kg/m. If this is out of the aformentioned range listed, please consider follow up with your Primary Care Provider.   ________________________________________________________  The [facility] providers would like to encourage you to use [facility] to communicate with providers for non-urgent requests or questions.  Due to long hold times on the telephone, sending your provider a message by [facility] may be a faster and more efficient way to get a response.  Please allow 48 business hours for a response.  Please remember that this is for non-urgent requests.  _______________________________________________________  Ophelia Charter and water.  Omeprazole 20 mg daily.  Levsin every eight hours as needed.   Follow up 1 year.   It was a pleasure to see you today!  Thank you for trusting me with your gastrointestinal care!

## 2021-04-11 ENCOUNTER — Other Ambulatory Visit: Payer: Self-pay | Admitting: Interventional Cardiology

## 2021-04-11 ENCOUNTER — Other Ambulatory Visit (HOSPITAL_COMMUNITY): Payer: Self-pay

## 2021-04-11 ENCOUNTER — Encounter: Payer: Self-pay | Admitting: Interventional Cardiology

## 2021-04-11 LAB — HEPATIC FUNCTION PANEL
ALT: 15 IU/L (ref 0–32)
AST: 22 IU/L (ref 0–40)
Albumin: 4 g/dL (ref 3.8–4.8)
Alkaline Phosphatase: 82 IU/L (ref 44–121)
Bilirubin Total: 0.4 mg/dL (ref 0.0–1.2)
Bilirubin, Direct: 0.13 mg/dL (ref 0.00–0.40)
Total Protein: 6.1 g/dL (ref 6.0–8.5)

## 2021-04-11 LAB — BASIC METABOLIC PANEL
BUN/Creatinine Ratio: 14 (ref 12–28)
BUN: 9 mg/dL (ref 8–27)
CO2: 26 mmol/L (ref 20–29)
Calcium: 8.8 mg/dL (ref 8.7–10.3)
Chloride: 103 mmol/L (ref 96–106)
Creatinine, Ser: 0.64 mg/dL (ref 0.57–1.00)
Glucose: 89 mg/dL (ref 70–99)
Potassium: 4.2 mmol/L (ref 3.5–5.2)
Sodium: 141 mmol/L (ref 134–144)
eGFR: 97 mL/min/{1.73_m2} (ref >59)

## 2021-04-11 LAB — HEMOGLOBIN A1C
Est. average glucose Bld gHb Est-mCnc: 108 mg/dL
Hgb A1c MFr Bld: 5.4 % (ref 4.8–5.6)

## 2021-04-11 LAB — LIPID PANEL
Chol/HDL Ratio: 2.1 ratio (ref 0.0–4.4)
Cholesterol, Total: 188 mg/dL (ref 100–199)
HDL: 88 mg/dL (ref >39)
LDL Chol Calc (NIH): 81 mg/dL (ref 0–99)
Triglycerides: 111 mg/dL (ref 0–149)
VLDL Cholesterol Cal: 19 mg/dL (ref 5–40)

## 2021-04-11 MED ORDER — LOSARTAN POTASSIUM 25 MG PO TABS
25.0000 mg | ORAL_TABLET | Freq: Every day | ORAL | 1 refills | Status: DC
  Filled 2021-04-11 – 2021-05-05 (×2): qty 90, 90d supply, fill #0
  Filled 2021-08-04: qty 90, 90d supply, fill #1

## 2021-04-11 MED ORDER — HYDROCHLOROTHIAZIDE 12.5 MG PO TABS
12.5000 mg | ORAL_TABLET | ORAL | 1 refills | Status: DC
  Filled 2021-04-11: qty 90, 90d supply, fill #0
  Filled 2021-07-21: qty 90, 90d supply, fill #1

## 2021-04-14 ENCOUNTER — Other Ambulatory Visit: Payer: PPO

## 2021-04-14 ENCOUNTER — Other Ambulatory Visit (HOSPITAL_COMMUNITY): Payer: Self-pay

## 2021-04-21 ENCOUNTER — Other Ambulatory Visit (HOSPITAL_COMMUNITY): Payer: Self-pay

## 2021-04-21 ENCOUNTER — Encounter: Payer: Self-pay | Admitting: Gastroenterology

## 2021-04-21 ENCOUNTER — Other Ambulatory Visit: Payer: Self-pay

## 2021-04-21 MED ORDER — HYOSCYAMINE SULFATE SL 0.125 MG SL SUBL: 1.0000 | SUBLINGUAL_TABLET | Freq: Three times a day (TID) | SUBLINGUAL | 3 refills | Status: DC | PRN

## 2021-04-23 ENCOUNTER — Telehealth: Payer: Self-pay

## 2021-04-23 ENCOUNTER — Other Ambulatory Visit (HOSPITAL_COMMUNITY): Payer: Self-pay

## 2021-04-23 NOTE — Telephone Encounter (Signed)
Patient scheduled for an appt with Dr Delsa Sale on 3/16 ?

## 2021-04-23 NOTE — Telephone Encounter (Signed)
Received call from Ms. Kister this morning Patient states "I used to see Dr. Denman George but would like to make an appointment for a colposcopy with Dr. Berline Lopes. I see Dr. Quincy Simmonds now, my last appointment with Dr. Quincy Simmonds  was in November." ? ?Advised patient that our office will look into this and someone will be in contact with her today or tomorrow regarding an appointment. Patient verbalized understanding.  ?

## 2021-04-24 ENCOUNTER — Telehealth: Payer: Self-pay

## 2021-04-24 ENCOUNTER — Encounter: Payer: Self-pay | Admitting: Gastroenterology

## 2021-04-24 NOTE — Telephone Encounter (Signed)
Following up with [name] this afternoon. Appointment scheduled with Dr. Berline Lopes on 05/22/21 at 2:30 pm. (Offered earlier appointment date but patient unavailable). Patient is in agreement of appointment date and time. Instructed to call with any needs.  ?

## 2021-04-24 NOTE — Telephone Encounter (Signed)
Received call from Ms. Bogue this morning. Patient does not want to proceed with her appointment with Dr. Delsa Sale as she is not an oncologist. She would prefer to have her colposcopy with Dr. Berline Lopes. Advised patient that the office will call and follow up with her regarding an appointment with Dr. Berline Lopes. Patient verbalized understanding and states "That's fine, it is not urgent." ?

## 2021-05-01 ENCOUNTER — Ambulatory Visit: Payer: PPO | Admitting: Obstetrics & Gynecology

## 2021-05-05 ENCOUNTER — Other Ambulatory Visit (HOSPITAL_COMMUNITY): Payer: Self-pay

## 2021-05-21 ENCOUNTER — Encounter: Payer: Self-pay | Admitting: Gynecologic Oncology

## 2021-05-22 ENCOUNTER — Inpatient Hospital Stay: Payer: PPO | Attending: Obstetrics & Gynecology | Admitting: Gynecologic Oncology

## 2021-05-22 ENCOUNTER — Encounter: Payer: Self-pay | Admitting: Gynecologic Oncology

## 2021-05-22 ENCOUNTER — Other Ambulatory Visit: Payer: Self-pay

## 2021-05-22 VITALS — BP 157/88 | HR 98 | Temp 98.9°F | Resp 16 | Ht 66.0 in | Wt 135.4 lb

## 2021-05-22 DIAGNOSIS — Z90710 Acquired absence of both cervix and uterus: Secondary | ICD-10-CM | POA: Insufficient documentation

## 2021-05-22 DIAGNOSIS — Z801 Family history of malignant neoplasm of trachea, bronchus and lung: Secondary | ICD-10-CM | POA: Insufficient documentation

## 2021-05-22 DIAGNOSIS — R87622 Low grade squamous intraepithelial lesion on cytologic smear of vagina (LGSIL): Secondary | ICD-10-CM

## 2021-05-22 DIAGNOSIS — B977 Papillomavirus as the cause of diseases classified elsewhere: Secondary | ICD-10-CM | POA: Insufficient documentation

## 2021-05-22 DIAGNOSIS — R87810 Cervical high risk human papillomavirus (HPV) DNA test positive: Secondary | ICD-10-CM | POA: Insufficient documentation

## 2021-05-22 DIAGNOSIS — N890 Mild vaginal dysplasia: Secondary | ICD-10-CM | POA: Insufficient documentation

## 2021-05-22 DIAGNOSIS — Z79890 Hormone replacement therapy (postmenopausal): Secondary | ICD-10-CM | POA: Diagnosis not present

## 2021-05-22 DIAGNOSIS — N891 Moderate vaginal dysplasia: Secondary | ICD-10-CM

## 2021-05-22 NOTE — Patient Instructions (Signed)
It was nice to meet you today.  I will call you with the biopsy results once I have them.  As long as this continues to show low-grade precancer, then I would recommend we consider doing some vaginal estrogen treatment. ?

## 2021-05-22 NOTE — Progress Notes (Signed)
Gynecologic Oncology Return Clinic Visit ? ?05/22/21 ? ?Reason for Visit: follow-up in the setting of vaginal dysplasia ? ?Treatment History: ?The patient has a long history of persistent high risk HPV and moderate vaginal dysplasia.  ? ?Patient has a 35 year history of initially cervical, and then vaginal dysplasia. She's had multiple excisional or ablative procedures throughout the years including cryotherapy remotely and a hysterectomy performed in 2013 for symptomatic uterine fibroids in the setting of low-grade cervical dysplasia. She developed vaginal dysplasia post-hysterectomy with a Pap smear in February 2016 showing low-grade squamous intraepithelial lesion and positive for high-risk HPV, and colposcopy in March 2016 revealed moderate grade dysplasia in the left vaginal vault in 2 very small foci one of which was biopsied to remove it and demonstrated VAIN II. ?  ?She had a pap in September 2016 which showed LSIL with positive hrHPV. She also had colpo in September 2016 with Dr Quincy Simmonds which showed persistent area at the left vaginal cuff that appeared abnormal and was biopsied at 2 sites. One site showed VAIN I and the other showed VAIN II. Dr Quincy Simmonds had recommended laser. ?  ?The patient had a pap smear on 04/17/15 with Dr Quincy Simmonds which showed LGSIL, with hr HPV detected. ?Biopsy on 05/20/15 showed koilocytic atypia consistent with HPV. ?  ?She went on to have repeat pap testing with HPV testing on 11/18/16 which showed ASCUS with positive high risk HPV. ? ?Biopsy on 01/04/17 with me in the office showed VAIN II. This was expectantly managed with the acknowledgement that she would likely have persistent changes and may progress to high grade. ?  ?Repeat pap on 12/15/17 showed ASC-US with positive high risk HPV detected. Dr Quincy Simmonds recommended colposcopy and biopsies but the patient declined at that time due to knowledge that she has persistent but stable low grade dysplasia. ?After strong encouragement by Dr Quincy Simmonds to  follow-up she presents to me today for follow-up. ? ?Vaginal biopsy on 08/26/2018 showed no dysplasia or malignancy. ? ?Pap test on 12/26/2019 showed L SIL, HR HPV+.  ? ?Vaginal biopsies on 03/15/20 (left inferior and superior apex) showed VAIN2.  ? ?Pap test on 12/20/2020 showed LSIL, HR HPV+. ? ?Interval History: ?Patient reports overall doing well.  She denies any vaginal bleeding or discharge.  Endorses normal bowel and bladder function.  Because of changes from a cough standpoint, she is now on oral estradiol. ? ?Past Medical/Surgical History: ?Past Medical History:  ?Diagnosis Date  ? Allergy   ? Anxiety   ? Through divorce  ? Arthritis   ? ASCUS with positive high risk HPV 12/2006  ? Negative Colpo BX  ? ASCUS with positive high risk HPV 2009  ? Chronic kidney disease   ? kidney stones  ? CIN III (cervical intraepithelial neoplasia III) 07/1983  ? tx'd w/cryo- no subsequent abnormal paps until 2008  ? Essential hypertension   ? Fibroid 2006  ? 3 cm Right Fundal Fibroid  ? GERD (gastroesophageal reflux disease) 2011  ? History of kidney stones   ? Hypertension   ? Osteopenia 2020  ? Synovial cyst of lumbar spine 10/2014  ? Vitamin D deficiency   ? ? ?Past Surgical History:  ?Procedure Laterality Date  ? ABDOMINAL HYSTERECTOMY  02/23/11  ? R TLH-fibroids, adenomyosis 221 g, & focal CIN I w/clear margins  ? ANTERIOR CERVICAL DECOMP/DISCECTOMY FUSION N/A 07/19/2012  ? Procedure: ANTERIOR CERVICAL DECOMPRESSION/DISCECTOMY FUSION 2 LEVELS;  Surgeon: Erline Levine, MD;  Location: [facility];  Service:  Neurosurgery;  Laterality: N/A;  Anterior Cervical Five-Six Cervical Six-Seven Anterior Cervical Decompression/Diskectomy/Fusion  ? COLONOSCOPY    ? CYSTOSCOPY  02/23/2011  ? PT.STATES SHE DID NOT HAVE  ? dental implants    ? LASIK  '99  ? LUMBAR LAMINECTOMY/DECOMPRESSION MICRODISCECTOMY Right 02/05/2015  ? Procedure: Right Lumbar four-five Laminectomy with Resection of Synovial Cyst ;  Surgeon: Erline Levine, MD;  Location: [facility];  Service: Neurosurgery;  Laterality: Right;  ? TONSILLECTOMY AND ADENOIDECTOMY    ? VAGINAL DELIVERY  '92 '94  ? ? ?Family History  ?Problem Relation Age of Onset  ? Hypertension Mother   ? Heart disease Mother   ? Cancer Mother   ?     lung  ? Hypertension Father   ? Heart attack Father   ? Colon cancer Neg Hx   ? Esophageal cancer Neg Hx   ? Pancreatic cancer Neg Hx   ? Prostate cancer Neg Hx   ? Rectal cancer Neg Hx   ? Stomach cancer Neg Hx   ? Breast cancer Neg Hx   ? ? ?Social History  ? ?Socioeconomic History  ? Marital status: Divorced  ?  Spouse name: Not on file  ? Number of children: 2  ? Years of education: Not on file  ? Highest education level: Not on file  ?Occupational History  ? Occupation: Therapist, sports  ?  Employer: [employer]  ?Tobacco Use  ? Smoking status: Never  ? Smokeless tobacco: Never  ?Vaping Use  ? Vaping Use: Never used  ?Substance and Sexual Activity  ? Alcohol use: Yes  ?  Alcohol/week: 10.0 - 12.0 standard drinks  ?  Types: 10 - 12 Glasses of wine per week  ?  Comment: 10-12 glasses of wine a week  ? Drug use: No  ? Sexual activity: Not Currently  ?  Partners: Male  ?  Birth control/protection: Post-menopausal, Surgical  ?  Comment: R-TLH  ?Other Topics Concern  ? Not on file  ?Social History Narrative  ? Not on file  ? ?Social Determinants of Health  ? ?Financial Resource Strain: Not on file  ?Food Insecurity: Not on file  ?Transportation Needs: Not on file  ?Physical Activity: Not on file  ?Stress: Not on file  ?Social Connections: Not on file  ? ? ?Current Medications: ? ?Current Outpatient Medications:  ?  Calcium Carb-Cholecalciferol (CALCIUM 500+D3 PO), Take 1 tablet by mouth daily., Disp: , Rfl:  ?  estradiol (ESTRACE) 0.5 MG tablet, Take 1 tablet (0.5 mg total) by mouth daily., Disp: 90 tablet, Rfl: 2 ?  hydrochlorothiazide (HYDRODIURIL) 12.5 MG tablet, Take 1 tablet (12.5 mg total) by mouth every morning., Disp: 90 tablet, Rfl: 1 ?  losartan (COZAAR) 25 MG tablet, Take 1  tablet (25 mg total) by mouth daily., Disp: 90 tablet, Rfl: 1 ?  Multiple Vitamins-Minerals (MULTIVITAMINS THER. W/MINERALS) TABS, Take 1 tablet by mouth daily., Disp: , Rfl:  ?  rosuvastatin (CRESTOR) 5 MG tablet, Take 1 tablet (5 mg total) by mouth daily., Disp: 90 tablet, Rfl: 3 ?  Wheat Dextrin (BENEFIBER PO), Take by mouth. Takes two teas spoon daily, Disp: , Rfl:  ?  Hyoscyamine Sulfate SL (LEVSIN/SL) 0.125 MG SUBL, Place 1 tablet under the tongue every 8 (eight) hours as needed. (Patient not taking: Reported on 05/21/2021), Disp: 90 tablet, Rfl: 3 ?  omeprazole (PRILOSEC) 20 MG capsule, Take 1 capsule (20 mg total) by mouth daily. (Patient not taking: Reported on 05/21/2021), Disp: 90  capsule, Rfl: 3 ? ?Review of Systems: ?Denies appetite changes, fevers, chills, fatigue, unexplained weight changes. ?Denies hearing loss, neck lumps or masses, mouth sores, ringing in ears or voice changes. ?Denies cough or wheezing.  Denies shortness of breath. ?Denies chest pain or palpitations. Denies leg swelling. ?Denies abdominal distention, pain, blood in stools, constipation, diarrhea, nausea, vomiting, or early satiety. ?Denies pain with intercourse, dysuria, frequency, hematuria or incontinence. ?Denies hot flashes, pelvic pain, vaginal bleeding or vaginal discharge.   ?Denies joint pain, back pain or muscle pain/cramps. ?Denies itching, rash, or wounds. ?Denies dizziness, headaches, numbness or seizures. ?Denies swollen lymph nodes or glands, denies easy bruising or bleeding. ?Denies anxiety, depression, confusion, or decreased concentration. ? ?Physical Exam: ?BP (!) 157/88 (BP Location: Left Arm, Patient Position: Sitting)   Pulse 98   Temp 98.9 ?F (37.2 ?C) (Tympanic)   Resp 16   Ht '5\' 6"'$  (1.676 m)   Wt 135 lb 6.4 oz (61.4 kg)   LMP 02/02/2011   SpO2 98%   BMI 21.85 kg/m?  ?General: Alert, oriented, no acute distress. ?HEENT: Normocephalic, atraumatic, sclera anicteric. ?Chest: Clear to auscultation  bilaterally.  No wheezes or rhonchi. ?Cardiovascular: Regular rate and rhythm, no murmurs. ?Abdomen: soft, nontender.  Normoactive bowel sounds.  No masses or hepatosplenomegaly appreciated.  Well-healed incisions. ?Extrem

## 2021-05-26 ENCOUNTER — Encounter: Payer: Self-pay | Admitting: Gynecologic Oncology

## 2021-05-26 LAB — SURGICAL PATHOLOGY

## 2021-05-27 ENCOUNTER — Telehealth: Payer: Self-pay | Admitting: Obstetrics and Gynecology

## 2021-05-27 NOTE — Telephone Encounter (Signed)
Recall placed

## 2021-05-27 NOTE — Telephone Encounter (Signed)
Please place in pap recall for April, 2024.  ? ?Seen by GYN [facility]C and had vaginal biopsy showing VAIN I.  ? ? ?

## 2021-05-29 DIAGNOSIS — M25561 Pain in right knee: Secondary | ICD-10-CM | POA: Diagnosis not present

## 2021-05-29 DIAGNOSIS — M1711 Unilateral primary osteoarthritis, right knee: Secondary | ICD-10-CM | POA: Diagnosis not present

## 2021-05-29 DIAGNOSIS — M1712 Unilateral primary osteoarthritis, left knee: Secondary | ICD-10-CM | POA: Diagnosis not present

## 2021-05-29 DIAGNOSIS — M170 Bilateral primary osteoarthritis of knee: Secondary | ICD-10-CM | POA: Diagnosis not present

## 2021-06-03 ENCOUNTER — Telehealth: Payer: Self-pay | Admitting: *Deleted

## 2021-06-03 NOTE — Telephone Encounter (Signed)
? ?  Pre-operative Risk Assessment  ?  ?Patient Name: Debra Nguyen  ?DOB: 09/08/1955 ?MRN: [id number]  ? ?  ? ?Request for Surgical Clearance   ? ?Procedure:   RIGHT TOTAL KNEE ARTHROPLASTY ? ?Date of Surgery:  Clearance 10/14/21                              ?   ?Surgeon:  DR. Gaynelle Arabian ?Surgeon's Group or Practice Name:  [facility] ?Phone number:  [phone] ?Fax number:  [phone] ATTN: [place] [facility] ?  ?Type of Clearance Requested:   ?- Medical  ?  ?Type of Anesthesia:   CHOICE ?  ?Additional requests/questions:   ? ?Signed, ?Julaine Hua   ?06/03/2021, 5:29 PM  ? ?

## 2021-06-04 ENCOUNTER — Telehealth: Payer: Self-pay | Admitting: *Deleted

## 2021-06-04 NOTE — Telephone Encounter (Signed)
?  Patient Consent for Virtual Visit  ? ? ?   ? ?Debra Nguyen has provided verbal consent on 06/04/2021 for a virtual visit (video or telephone). ? ? ?CONSENT FOR VIRTUAL VISIT FOR:  Debra Nguyen  ?By participating in this virtual visit I agree to the following: ? ?I hereby voluntarily request, consent and authorize [facility] and its employed or contracted physicians, physician assistants, nurse practitioners or other licensed health care professionals (the Practitioner), to provide me with telemedicine health care services (the ?Services") as deemed necessary by the treating Practitioner. I acknowledge and consent to receive the Services by the Practitioner via telemedicine. I understand that the telemedicine visit will involve communicating with the Practitioner through live audiovisual communication technology and the disclosure of certain medical information by electronic transmission. I acknowledge that I have been given the opportunity to request an in-person assessment or other available alternative prior to the telemedicine visit and am voluntarily participating in the telemedicine visit. ? ?I understand that I have the right to withhold or withdraw my consent to the use of telemedicine in the course of my care at any time, without affecting my right to future care or treatment, and that the Practitioner or I may terminate the telemedicine visit at any time. I understand that I have the right to inspect all information obtained and/or recorded in the course of the telemedicine visit and may receive copies of available information for a reasonable fee.  I understand that some of the potential risks of receiving the Services via telemedicine include:  ?Delay or interruption in medical evaluation due to technological equipment failure or disruption; ?Information transmitted may not be sufficient (e.g. poor resolution of images) to allow for appropriate medical decision making by the Practitioner;  and/or  ?In rare instances, security protocols could fail, causing a breach of personal health information. ? ?Furthermore, I acknowledge that it is my responsibility to provide information about my medical history, conditions and care that is complete and accurate to the best of my ability. I acknowledge that Practitioner's advice, recommendations, and/or decision may be based on factors not within their control, such as incomplete or inaccurate data provided by me or distortions of diagnostic images or specimens that may result from electronic transmissions. I understand that the practice of medicine is not an exact science and that Practitioner makes no warranties or guarantees regarding treatment outcomes. I acknowledge that a copy of this consent can be made available to me via my patient portal ([facility]), or I can request a printed copy by calling the office of [facility].   ? ?I understand that my insurance will be billed for this visit.  ? ?I have read or had this consent read to me. ?I understand the contents of this consent, which adequately explains the benefits and risks of the Services being provided via telemedicine.  ?I have been provided ample opportunity to ask questions regarding this consent and the Services and have had my questions answered to my satisfaction. ?I give my informed consent for the services to be provided through the use of telemedicine in my medical care ? ?  ?

## 2021-06-04 NOTE — Telephone Encounter (Signed)
Spoke with patient and scheduled her for a telehealth appointment on 04/11/21 at 2:40 PM. Consent in and meds reviewed. ?

## 2021-06-04 NOTE — Telephone Encounter (Signed)
? ? ?  Name: Debra Nguyen  ?DOB: 1955/05/17  ?MRN: [id number] ? ?Primary Cardiologist: Sinclair Grooms, MD ? ? ?Preoperative team, please contact this patient and set up a phone call appointment for further preoperative risk assessment. Please obtain consent and complete medication review. Thank you for your help. ? ?I confirm that guidance regarding antiplatelet and oral anticoagulation therapy has been completed and, if necessary, noted below. ? ? ? ?[name], PA ?06/04/2021, 11:35 AM ?[facility] ?[address] ?[place], [place] [address] ? ? ?

## 2021-06-09 ENCOUNTER — Ambulatory Visit: Payer: PPO | Admitting: Physician Assistant

## 2021-06-09 DIAGNOSIS — Z01810 Encounter for preprocedural cardiovascular examination: Secondary | ICD-10-CM

## 2021-06-09 NOTE — Progress Notes (Signed)
? ?Virtual Visit via Telephone Note  ? ?This visit type was conducted due to national recommendations for restrictions regarding the COVID-19 Pandemic (e.g. social distancing) in an effort to limit this patient's exposure and mitigate transmission in our community.  Due to her co-morbid illnesses, this patient is at least at moderate risk for complications without adequate follow up.  This format is felt to be most appropriate for this patient at this time.  The patient did not have access to video technology/had technical difficulties with video requiring transitioning to audio format only (telephone).  All issues noted in this document were discussed and addressed.  No physical exam could be performed with this format.  Please refer to the patient's chart for her  consent to telehealth for [facility]. ? ?Evaluation Performed:  Preoperative cardiovascular risk assessment ?_____________  ? ?Date:  06/09/2021  ? ?Patient ID:  Debra Nguyen, DOB 09/05/1955, MRN [id number] ?Patient Location:  ?Home ?Provider location:   ?Office ? ?Primary Care Provider:  Kelton Pillar, MD ?Primary Cardiologist:  Sinclair Grooms, MD ? ?Chief Complaint  ?  ?66 y.o. y/o female with a h/o HTN, HLD, and asymptomatic coronary calcification, who is pending RIGHT TOTAL KNEE ARTHROPLASTY, and presents today for telephonic preoperative cardiovascular risk assessment. ? ?Past Medical History  ?  ?Past Medical History:  ?Diagnosis Date  ? Allergy   ? Anxiety   ? Through divorce  ? Arthritis   ? ASCUS with positive high risk HPV 12/2006  ? Negative Colpo BX  ? ASCUS with positive high risk HPV 2009  ? Chronic kidney disease   ? kidney stones  ? CIN III (cervical intraepithelial neoplasia III) 07/1983  ? tx'd w/cryo- no subsequent abnormal paps until 2008  ? Essential hypertension   ? Fibroid 2006  ? 3 cm Right Fundal Fibroid  ? GERD (gastroesophageal reflux disease) 2011  ? History of kidney stones   ? Hypertension   ? Osteopenia 2020  ?  Synovial cyst of lumbar spine 10/2014  ? Vitamin D deficiency   ? ?Past Surgical History:  ?Procedure Laterality Date  ? ABDOMINAL HYSTERECTOMY  02/23/11  ? R TLH-fibroids, adenomyosis 221 g, & focal CIN I w/clear margins  ? ANTERIOR CERVICAL DECOMP/DISCECTOMY FUSION N/A 07/19/2012  ? Procedure: ANTERIOR CERVICAL DECOMPRESSION/DISCECTOMY FUSION 2 LEVELS;  Surgeon: Erline Levine, MD;  Location: [facility];  Service: Neurosurgery;  Laterality: N/A;  Anterior Cervical Five-Six Cervical Six-Seven Anterior Cervical Decompression/Diskectomy/Fusion  ? COLONOSCOPY    ? CYSTOSCOPY  02/23/2011  ? PT.STATES SHE DID NOT HAVE  ? dental implants    ? LASIK  '99  ? LUMBAR LAMINECTOMY/DECOMPRESSION MICRODISCECTOMY Right 02/05/2015  ? Procedure: Right Lumbar four-five Laminectomy with Resection of Synovial Cyst ;  Surgeon: Erline Levine, MD;  Location: [facility];  Service: Neurosurgery;  Laterality: Right;  ? TONSILLECTOMY AND ADENOIDECTOMY    ? VAGINAL DELIVERY  '92 '94  ? ? ?Allergies ? ?Allergies  ?Allergen Reactions  ? Codeine Itching  ? Ultram [Tramadol] Itching  ? ? ?History of Present Illness  ?  ?Debra Nguyen is a 66 y.o. female who presents via audio/video conferencing for a telehealth visit today.  Pt was last seen in cardiology clinic on 09/03/20 by Dr. Tamala Julian.  At that time Debra Nguyen was doing well.  The patient is now pending RIGHT TOTAL KNEE ARTHROPLASTY.  Since her last visit, she has done well. She remains active and can complete more than  4.0 METS without angina. She participates in water aerobics for 60-90 minutes several times per week.  ? ?Home Medications  ?  ?Prior to Admission medications   ?Medication Sig Start Date End Date Taking? Authorizing Provider  ?Calcium Carb-Cholecalciferol (CALCIUM 500+D3 PO) Take 1 tablet by mouth daily.    [provider]  ?estradiol (ESTRACE) 0.5 MG tablet Take 1 tablet (0.5 mg total) by mouth daily. 03/12/21   Nunzio Cobbs, MD  ?hydrochlorothiazide  (HYDRODIURIL) 12.5 MG tablet Take 1 tablet (12.5 mg total) by mouth every morning. 04/11/21     ?Hyoscyamine Sulfate SL (LEVSIN/SL) 0.125 MG SUBL Place 1 tablet under the tongue every 8 (eight) hours as needed. ?Patient not taking: Reported on 05/21/2021 04/21/21   Mauri Pole, MD  ?losartan (COZAAR) 25 MG tablet Take 1 tablet (25 mg total) by mouth daily. 04/11/21   Belva Crome, MD  ?Multiple Vitamins-Minerals (MULTIVITAMINS THER. W/MINERALS) TABS Take 1 tablet by mouth daily.    [provider]  ?omeprazole (PRILOSEC) 20 MG capsule Take 1 capsule (20 mg total) by mouth daily. ?Patient not taking: Reported on 05/21/2021 04/10/21   Mauri Pole, MD  ?rosuvastatin (CRESTOR) 5 MG tablet Take 1 tablet (5 mg total) by mouth daily. 10/01/20   Belva Crome, MD  ?Wheat Dextrin (BENEFIBER PO) Take by mouth. Takes two teas spoon daily    [provider]  ? ? ?Physical Exam  ?  ?Vital Signs:  Debra Nguyen does not have vital signs available for review today. ? ?Given telephonic nature of communication, physical exam is limited. ?AAOx3. NAD. Normal affect.  Speech and respirations are unlabored. ? ?Accessory Clinical Findings  ?  ?None ? ?Assessment & Plan  ?  ?1.  Preoperative Cardiovascular Risk Assessment: ? ?She does not have a history of ischemic heart disease or stroke. She can complete more than 4.0 METS without angina. She is low risk to proceed with surgery.  ? ?Therefore, based on ACC/AHA guidelines, the patient would be at acceptable risk for the planned procedure without further cardiovascular testing.  ? ?The patient was advised that if she develops new symptoms prior to surgery to contact our office to arrange for a follow-up visit, and she verbalized understanding. ? ? ?A copy of this note will be routed to requesting surgeon. ? ?Time:   ?Today, I have spent 14 minutes with the patient with telehealth technology discussing medical history, symptoms, and management plan.    ? ? ?[name], PA ? ?06/09/2021, 3:06 PM ?

## 2021-06-16 ENCOUNTER — Other Ambulatory Visit (HOSPITAL_COMMUNITY): Payer: Self-pay

## 2021-07-21 ENCOUNTER — Other Ambulatory Visit (HOSPITAL_COMMUNITY): Payer: Self-pay

## 2021-08-04 ENCOUNTER — Other Ambulatory Visit (HOSPITAL_COMMUNITY): Payer: Self-pay

## 2021-08-11 ENCOUNTER — Ambulatory Visit: Payer: PPO

## 2021-08-11 ENCOUNTER — Ambulatory Visit
Admission: RE | Admit: 2021-08-11 | Discharge: 2021-08-11 | Disposition: A | Discharge status: Home / Self Care | Payer: PPO | Source: Ambulatory Visit | Attending: Family Medicine | Admitting: Family Medicine

## 2021-08-11 ENCOUNTER — Other Ambulatory Visit: Payer: PPO

## 2021-08-11 DIAGNOSIS — Z1231 Encounter for screening mammogram for malignant neoplasm of breast: Secondary | ICD-10-CM

## 2021-09-09 ENCOUNTER — Other Ambulatory Visit (HOSPITAL_COMMUNITY): Payer: Self-pay

## 2021-09-15 DIAGNOSIS — M25561 Pain in right knee: Secondary | ICD-10-CM | POA: Diagnosis not present

## 2021-09-15 DIAGNOSIS — M25661 Stiffness of right knee, not elsewhere classified: Secondary | ICD-10-CM | POA: Diagnosis not present

## 2021-09-15 DIAGNOSIS — M1711 Unilateral primary osteoarthritis, right knee: Secondary | ICD-10-CM | POA: Diagnosis not present

## 2021-09-16 ENCOUNTER — Other Ambulatory Visit (HOSPITAL_COMMUNITY): Payer: Self-pay

## 2021-09-16 HISTORY — PX: REPLACEMENT TOTAL KNEE: SUR1224

## 2021-09-16 MED ORDER — TRAMADOL HCL 50 MG PO TABS
50.0000 mg | ORAL_TABLET | Freq: Four times a day (QID) | ORAL | 0 refills | Status: DC | PRN
  Filled 2021-10-06: qty 40, 5d supply, fill #0

## 2021-09-16 MED ORDER — METHOCARBAMOL 500 MG PO TABS
500.0000 mg | ORAL_TABLET | Freq: Four times a day (QID) | ORAL | 0 refills | Status: DC | PRN
  Filled 2021-10-06: qty 40, 10d supply, fill #0

## 2021-09-16 MED ORDER — ONDANSETRON HCL 4 MG PO TABS
4.0000 mg | ORAL_TABLET | Freq: Four times a day (QID) | ORAL | 0 refills | Status: DC | PRN
  Filled 2021-10-06: qty 20, 30d supply, fill #0

## 2021-09-16 MED ORDER — OXYCODONE HCL 5 MG PO TABS
5.0000 mg | ORAL_TABLET | Freq: Four times a day (QID) | ORAL | 0 refills | Status: DC | PRN
  Filled 2021-10-06: qty 42, 6d supply, fill #0

## 2021-09-16 MED ORDER — ASPIRIN 325 MG PO TABS: 325.0000 mg | ORAL_TABLET | Freq: Two times a day (BID) | ORAL | 0 refills | Status: AC

## 2021-09-16 MED ORDER — GABAPENTIN 300 MG PO CAPS
ORAL_CAPSULE | ORAL | 0 refills | Status: DC
  Filled 2021-10-06: qty 84, 42d supply, fill #0

## 2021-09-18 ENCOUNTER — Ambulatory Visit
Admission: RE | Admit: 2021-09-18 | Discharge: 2021-09-18 | Disposition: A | Discharge status: Home / Self Care | Payer: PPO | Source: Ambulatory Visit | Attending: Family Medicine | Admitting: Family Medicine

## 2021-09-18 DIAGNOSIS — M8588 Other specified disorders of bone density and structure, other site: Secondary | ICD-10-CM

## 2021-09-18 DIAGNOSIS — Z0189 Encounter for other specified special examinations: Secondary | ICD-10-CM | POA: Diagnosis not present

## 2021-09-18 DIAGNOSIS — Z01812 Encounter for preprocedural laboratory examination: Secondary | ICD-10-CM | POA: Diagnosis not present

## 2021-09-18 DIAGNOSIS — M85851 Other specified disorders of bone density and structure, right thigh: Secondary | ICD-10-CM | POA: Diagnosis not present

## 2021-09-18 DIAGNOSIS — Z780 Asymptomatic menopausal state: Secondary | ICD-10-CM | POA: Diagnosis not present

## 2021-09-27 IMAGING — CT CT CARDIAC CORONARY ARTERY CALCIUM SCORE
3 series · 14 of 20 positions shown, 15 images · non-contrast
Comparison: None.
COMPARISON: None.

Addendum:
EXAM:
OVER-READ INTERPRETATION  CT CHEST

The following report is an over-read performed by radiologist Dr.
Abedrazak Sa [REDACTED] on 06/19/2020. This
over-read does not include interpretation of cardiac or coronary
anatomy or pathology. The coronary calcium score interpretation by
the cardiologist is attached.
CLINICAL DATA: Cardiovascular Disease Risk stratification
Coronary Calcium Score
TECHNIQUE: A gated, non-contrast computed tomography scan of the heart was
performed using 3mm slice thickness. Axial images were analyzed on a
dedicated workstation. Calcium scoring of the coronary arteries was
performed using the Agatston method.

[Series 2: casc 3.0 bv41 2 bestsyst 34 % · axial · 0.36mm/px · z∈[-221,-131]mm · 4 of 50 slices shown, 5 images]
[im 10/50  vessel]
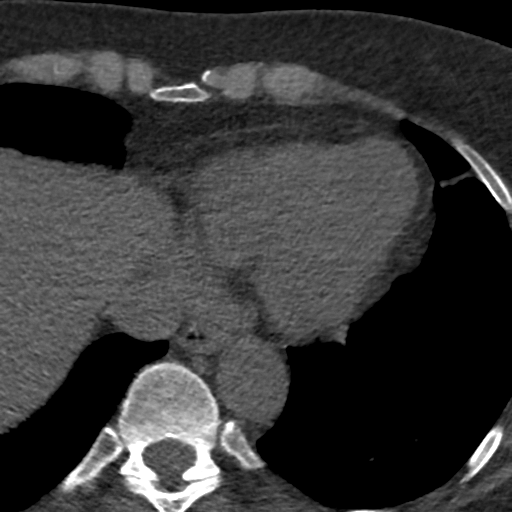
[im 10/50  lung]
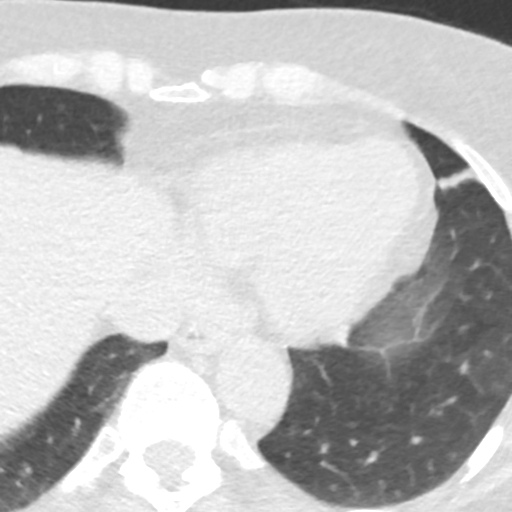
[im 20/50  vessel]
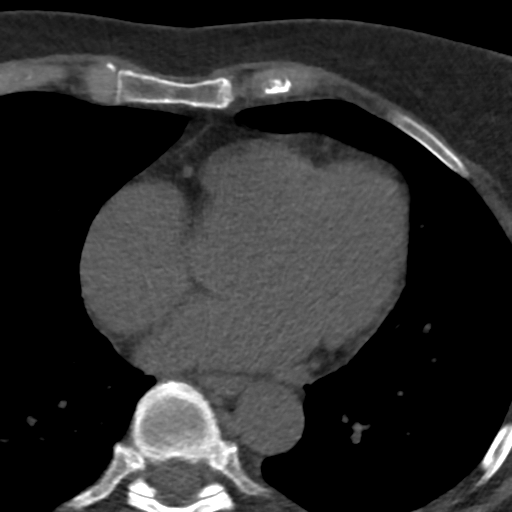
[im 30/50  vessel]
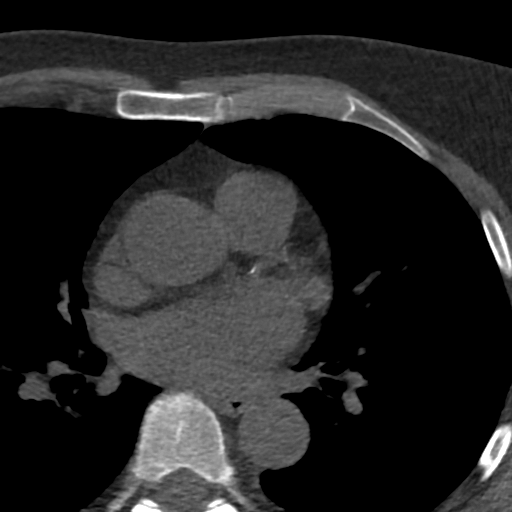
[im 40/50  vessel]
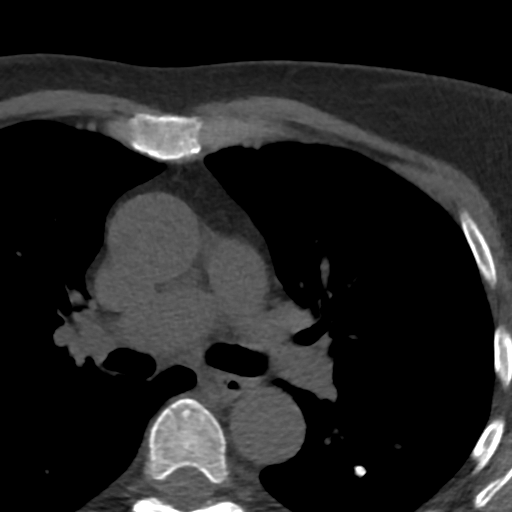

[Series 3: lung 34 % · axial · 0.65mm/px · z∈[-224,-128]mm · 5 of 50 slices shown]
[im 9/50  lung]
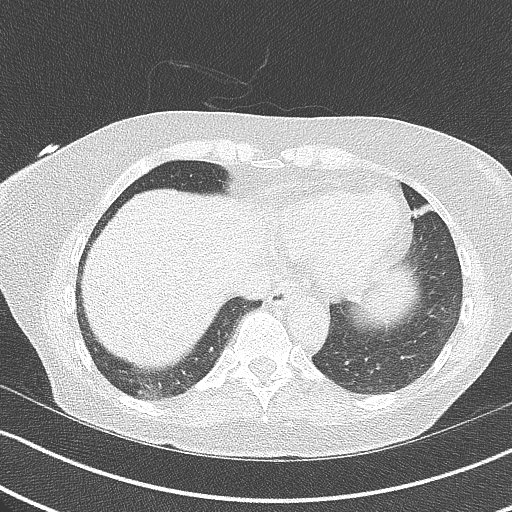
[im 17/50  lung]
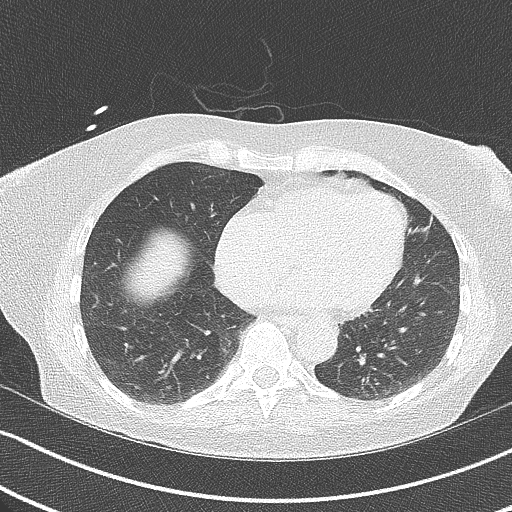
[im 25/50  lung]
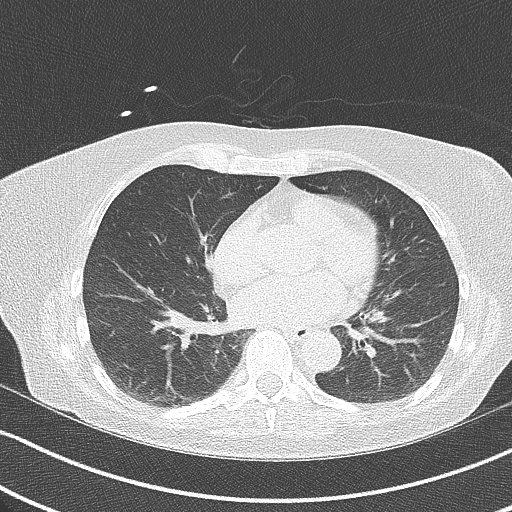
[im 33/50  lung]
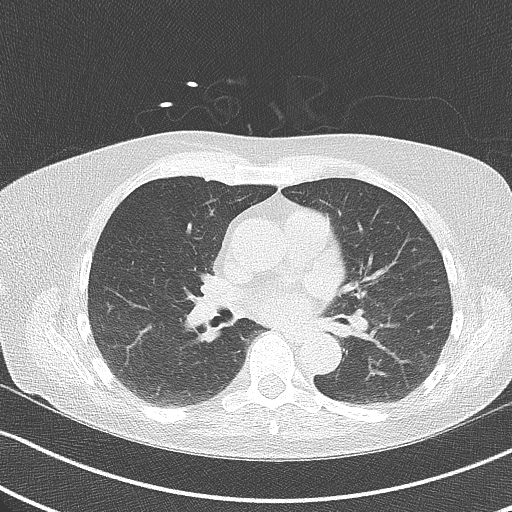
[im 41/50  lung]
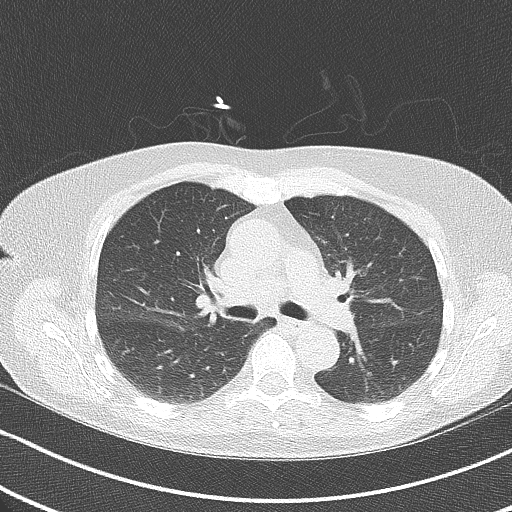

[Series 4: lung st 34 % · axial · 0.65mm/px · z∈[-224,-128]mm · 5 of 50 slices shown]
[im 9/50  lung]
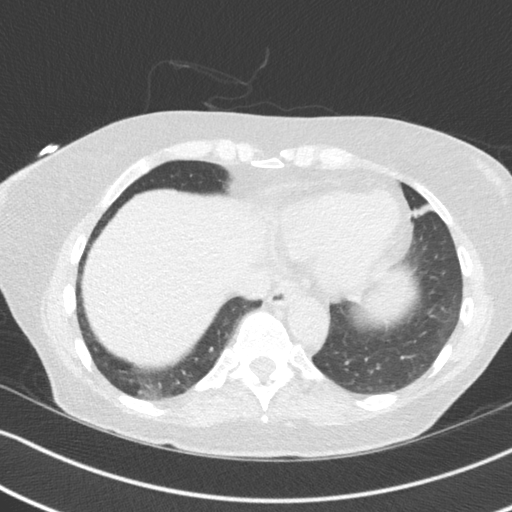
[im 17/50  lung]
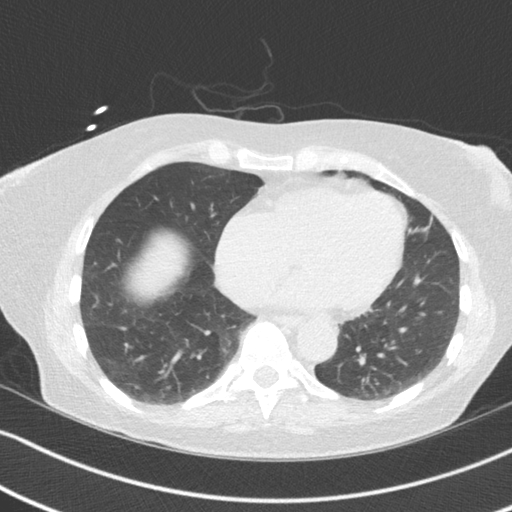
[im 25/50  lung]
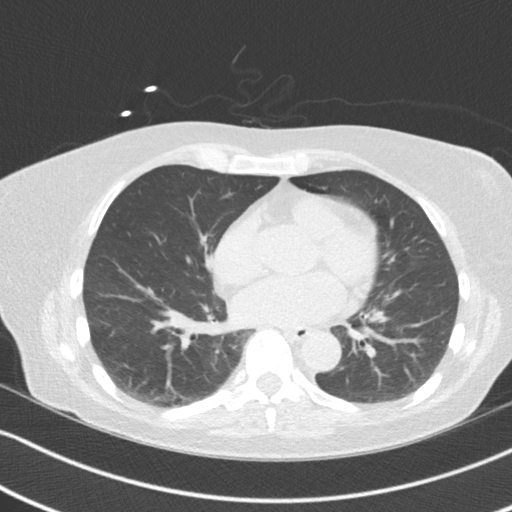
[im 33/50  lung]
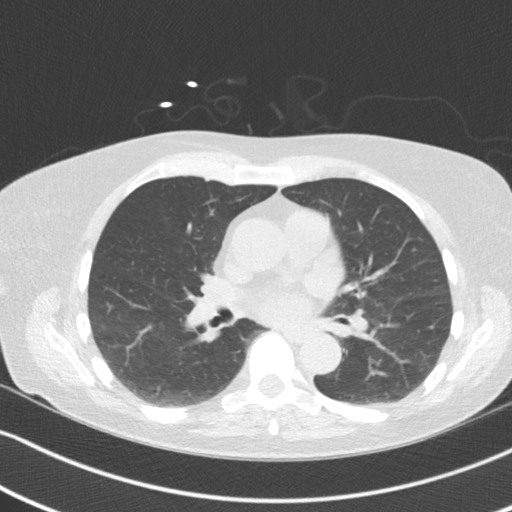
[im 41/50  lung]
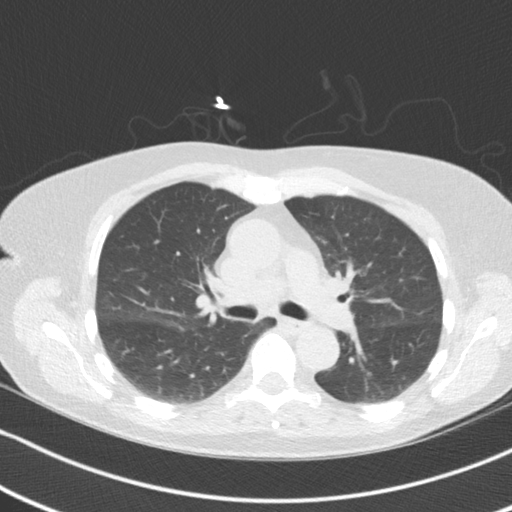

[14 of 20 positions shown; findings below may reference images not displayed]

FINDINGS: Atherosclerotic calcifications in the thoracic aorta. Within the
visualized portions of the thorax there are no suspicious appearing
pulmonary nodules or masses, there is no acute consolidative
airspace disease, no pleural effusions, no pneumothorax and no
lymphadenopathy. Visualized portions of the upper abdomen are
unremarkable. There are no aggressive appearing lytic or blastic
lesions noted in the visualized portions of the skeleton.
IMPRESSION: 1.  Aortic Atherosclerosis (ZBKPN-DPF.F).
FINDINGS: Coronary Calcium Score:

Left main:

Left anterior descending artery:

Left circumflex artery: 0

Right coronary artery: 0

Total:

Percentile: 67th

Pericardium: Normal.

Ascending Aorta: Normal caliber.

Non-cardiac: See separate report from [REDACTED].
IMPRESSION: Coronary calcium score of 32.3. This was 67th percentile for age-,
race-, and sex-matched controls.



If CAC=0, it is reasonable to withhold statin therapy and reassess
in 5 to 10 years, as long as higher risk conditions are absent
(diabetes mellitus, family history of premature CHD in first degree
relatives (males <55 years; females <65 years), cigarette smoking,
or LDL >=190 mg/dL).

If CAC is 1 to 99, it is reasonable to initiate statin therapy for
patients >=55 years of age.

If CAC is >=100 or >=75th percentile, it is reasonable to initiate
statin therapy at any age.

Cardiology referral should be considered for patients with CAC
scores >=400 or >=75th percentile.

*8602 AHA/ACC/AACVPR/AAPA/ABC/BRIGHTON NDLOVN MNKOMIYAHLABA/MUTAWAKEL/KESSEL/Davi/ASHEQAH/RANULFO/RAFAEL ALCIDES
Guideline on the Management of Blood Cholesterol: A Report of the
American College of Cardiology/American Heart Association Task Force
on Clinical Practice Guidelines. J Am Coll Cardiol.
4878;73(24):8044-8505.

*** End of Addendum ***
EXAM:
OVER-READ INTERPRETATION  CT CHEST

The following report is an over-read performed by radiologist Dr.
Abedrazak Sa [REDACTED] on 06/19/2020. This
over-read does not include interpretation of cardiac or coronary
anatomy or pathology. The coronary calcium score interpretation by
the cardiologist is attached.
FINDINGS: Atherosclerotic calcifications in the thoracic aorta. Within the
visualized portions of the thorax there are no suspicious appearing
pulmonary nodules or masses, there is no acute consolidative
airspace disease, no pleural effusions, no pneumothorax and no
lymphadenopathy. Visualized portions of the upper abdomen are
unremarkable. There are no aggressive appearing lytic or blastic
lesions noted in the visualized portions of the skeleton.
IMPRESSION: 1.  Aortic Atherosclerosis (ZBKPN-DPF.F).

## 2021-10-06 ENCOUNTER — Other Ambulatory Visit (HOSPITAL_COMMUNITY): Payer: Self-pay

## 2021-10-08 DIAGNOSIS — G8918 Other acute postprocedural pain: Secondary | ICD-10-CM | POA: Diagnosis not present

## 2021-10-08 DIAGNOSIS — M1711 Unilateral primary osteoarthritis, right knee: Secondary | ICD-10-CM | POA: Diagnosis not present

## 2021-10-10 DIAGNOSIS — M25561 Pain in right knee: Secondary | ICD-10-CM | POA: Diagnosis not present

## 2021-10-13 ENCOUNTER — Other Ambulatory Visit: Payer: Self-pay | Admitting: Interventional Cardiology

## 2021-10-13 ENCOUNTER — Other Ambulatory Visit (HOSPITAL_COMMUNITY): Payer: Self-pay

## 2021-10-13 DIAGNOSIS — M25561 Pain in right knee: Secondary | ICD-10-CM | POA: Diagnosis not present

## 2021-10-13 MED ORDER — ROSUVASTATIN CALCIUM 5 MG PO TABS
5.0000 mg | ORAL_TABLET | Freq: Every day | ORAL | 2 refills | Status: DC
  Filled 2021-10-13: qty 90, 90d supply, fill #0
  Filled 2022-01-13: qty 90, 90d supply, fill #1
  Filled 2022-04-13: qty 90, 90d supply, fill #2

## 2021-10-14 ENCOUNTER — Other Ambulatory Visit (HOSPITAL_COMMUNITY): Payer: Self-pay

## 2021-10-14 MED ORDER — HYDROCHLOROTHIAZIDE 12.5 MG PO TABS
12.5000 mg | ORAL_TABLET | Freq: Every morning | ORAL | 2 refills | Status: DC
  Filled 2021-10-14: qty 90, 90d supply, fill #0
  Filled 2022-01-13: qty 90, 90d supply, fill #1
  Filled 2022-04-13: qty 90, 90d supply, fill #2

## 2021-10-15 ENCOUNTER — Other Ambulatory Visit (HOSPITAL_COMMUNITY): Payer: Self-pay

## 2021-10-15 DIAGNOSIS — M25561 Pain in right knee: Secondary | ICD-10-CM | POA: Diagnosis not present

## 2021-10-17 DIAGNOSIS — M25561 Pain in right knee: Secondary | ICD-10-CM | POA: Diagnosis not present

## 2021-10-19 ENCOUNTER — Other Ambulatory Visit (HOSPITAL_COMMUNITY): Payer: Self-pay

## 2021-10-21 ENCOUNTER — Other Ambulatory Visit (HOSPITAL_COMMUNITY): Payer: Self-pay

## 2021-10-21 DIAGNOSIS — M25561 Pain in right knee: Secondary | ICD-10-CM | POA: Diagnosis not present

## 2021-10-21 MED ORDER — METHOCARBAMOL 500 MG PO TABS
500.0000 mg | ORAL_TABLET | Freq: Four times a day (QID) | ORAL | 0 refills | Status: DC | PRN
  Filled 2021-10-21: qty 40, 10d supply, fill #0

## 2021-10-23 DIAGNOSIS — M25561 Pain in right knee: Secondary | ICD-10-CM | POA: Diagnosis not present

## 2021-10-27 DIAGNOSIS — M25561 Pain in right knee: Secondary | ICD-10-CM | POA: Diagnosis not present

## 2021-10-30 ENCOUNTER — Other Ambulatory Visit (HOSPITAL_COMMUNITY): Payer: Self-pay

## 2021-10-30 DIAGNOSIS — M25561 Pain in right knee: Secondary | ICD-10-CM | POA: Diagnosis not present

## 2021-10-30 MED ORDER — CYCLOBENZAPRINE HCL 10 MG PO TABS
10.0000 mg | ORAL_TABLET | Freq: Three times a day (TID) | ORAL | 0 refills | Status: DC | PRN
  Filled 2021-10-30: qty 30, 10d supply, fill #0

## 2021-11-03 ENCOUNTER — Other Ambulatory Visit: Payer: Self-pay | Admitting: Interventional Cardiology

## 2021-11-03 DIAGNOSIS — M25561 Pain in right knee: Secondary | ICD-10-CM | POA: Diagnosis not present

## 2021-11-04 ENCOUNTER — Other Ambulatory Visit (HOSPITAL_COMMUNITY): Payer: Self-pay

## 2021-11-04 MED ORDER — LOSARTAN POTASSIUM 25 MG PO TABS
25.0000 mg | ORAL_TABLET | Freq: Every day | ORAL | 2 refills | Status: DC
  Filled 2021-11-04: qty 90, 90d supply, fill #0
  Filled 2022-02-02: qty 90, 90d supply, fill #1
  Filled 2022-05-05: qty 90, 90d supply, fill #2

## 2021-11-04 MED ORDER — GABAPENTIN 300 MG PO CAPS
300.0000 mg | ORAL_CAPSULE | Freq: Every day | ORAL | 0 refills | Status: DC
  Filled 2021-11-04 – 2021-11-11 (×2): qty 7, 7d supply, fill #0

## 2021-11-06 ENCOUNTER — Other Ambulatory Visit (HOSPITAL_COMMUNITY): Payer: Self-pay

## 2021-11-06 DIAGNOSIS — M25561 Pain in right knee: Secondary | ICD-10-CM | POA: Diagnosis not present

## 2021-11-11 ENCOUNTER — Other Ambulatory Visit (HOSPITAL_COMMUNITY): Payer: Self-pay

## 2021-11-11 DIAGNOSIS — M25561 Pain in right knee: Secondary | ICD-10-CM | POA: Diagnosis not present

## 2021-11-12 ENCOUNTER — Other Ambulatory Visit (HOSPITAL_COMMUNITY): Payer: Self-pay

## 2021-11-12 DIAGNOSIS — Z5189 Encounter for other specified aftercare: Secondary | ICD-10-CM | POA: Diagnosis not present

## 2021-11-12 MED ORDER — METHOCARBAMOL 500 MG PO TABS
500.0000 mg | ORAL_TABLET | Freq: Four times a day (QID) | ORAL | 0 refills | Status: DC | PRN
  Filled 2021-11-12: qty 40, 10d supply, fill #0

## 2021-11-13 ENCOUNTER — Other Ambulatory Visit (HOSPITAL_COMMUNITY): Payer: Self-pay

## 2021-11-13 DIAGNOSIS — M25561 Pain in right knee: Secondary | ICD-10-CM | POA: Diagnosis not present

## 2021-11-17 DIAGNOSIS — M25561 Pain in right knee: Secondary | ICD-10-CM | POA: Diagnosis not present

## 2021-11-20 DIAGNOSIS — M25561 Pain in right knee: Secondary | ICD-10-CM | POA: Diagnosis not present

## 2021-11-27 DIAGNOSIS — M25561 Pain in right knee: Secondary | ICD-10-CM | POA: Diagnosis not present

## 2021-12-06 ENCOUNTER — Other Ambulatory Visit: Payer: Self-pay | Admitting: Obstetrics and Gynecology

## 2021-12-08 ENCOUNTER — Other Ambulatory Visit (HOSPITAL_COMMUNITY): Payer: Self-pay

## 2021-12-08 MED ORDER — ESTRADIOL 0.5 MG PO TABS
0.5000 mg | ORAL_TABLET | Freq: Every day | ORAL | 2 refills | Status: DC
  Filled 2021-12-08: qty 8, 8d supply, fill #0
  Filled 2021-12-09: qty 7, 7d supply, fill #0
  Filled 2021-12-09: qty 83, 83d supply, fill #0
  Filled 2022-03-08: qty 90, 90d supply, fill #1

## 2021-12-08 NOTE — Telephone Encounter (Signed)
Dr Quincy Simmonds has a note confirming 4/24 for f/u pap.  Her mammogram is UTD, I have sent a refill for her estrogen

## 2021-12-08 NOTE — Telephone Encounter (Signed)
Last AEX 12/10/2020--nothing scheduled for 2023.  Last mammo 08/11/2021-WNL  Will send pt a msg.

## 2021-12-09 ENCOUNTER — Other Ambulatory Visit (HOSPITAL_COMMUNITY): Payer: Self-pay

## 2021-12-16 NOTE — Telephone Encounter (Signed)
Tillie Rung D, CMA Two messages have been left for patient to call and schedule; patient has not called back. I place recall in epic.

## 2022-01-12 DIAGNOSIS — L570 Actinic keratosis: Secondary | ICD-10-CM | POA: Diagnosis not present

## 2022-01-12 DIAGNOSIS — D225 Melanocytic nevi of trunk: Secondary | ICD-10-CM | POA: Diagnosis not present

## 2022-01-12 DIAGNOSIS — L821 Other seborrheic keratosis: Secondary | ICD-10-CM | POA: Diagnosis not present

## 2022-01-12 DIAGNOSIS — D485 Neoplasm of uncertain behavior of skin: Secondary | ICD-10-CM | POA: Diagnosis not present

## 2022-01-12 DIAGNOSIS — L578 Other skin changes due to chronic exposure to nonionizing radiation: Secondary | ICD-10-CM | POA: Diagnosis not present

## 2022-01-12 DIAGNOSIS — L814 Other melanin hyperpigmentation: Secondary | ICD-10-CM | POA: Diagnosis not present

## 2022-01-12 DIAGNOSIS — D0462 Carcinoma in situ of skin of left upper limb, including shoulder: Secondary | ICD-10-CM | POA: Diagnosis not present

## 2022-01-12 DIAGNOSIS — D220 Melanocytic nevi of lip: Secondary | ICD-10-CM | POA: Diagnosis not present

## 2022-01-14 ENCOUNTER — Other Ambulatory Visit (HOSPITAL_COMMUNITY): Payer: Self-pay

## 2022-01-20 NOTE — Progress Notes (Unsigned)
Cardiology Office Note:    Date:  01/22/2022   ID:  Debra Nguyen, DOB 1955/04/02, MRN [id number]  PCP:  Kelton Pillar, MD  Cardiologist:  Sinclair Grooms, MD   Referring MD: Kelton Pillar, MD   Chief Complaint  Patient presents with   Hypertension   Hyperlipidemia    History of Present Illness:    Debra Nguyen is a 66 y.o. female with a hx of primary hypertension, hyperlipidemia, and asymptomatic coronary calcification.   Total right knee arthroplasty 10/08/2021.  She is doing well and has no cardiac complaints.  She has hypertension and hyperlipidemia with asymptomatic coronary artery disease.  She is a Dietitian.  She wants to have continued cardiovascular supervision by a cardiologist.  Past Medical History:  Diagnosis Date   Allergy    Anxiety    Through divorce   Arthritis    ASCUS with positive high risk HPV 12/2006   Negative Colpo BX   ASCUS with positive high risk HPV 2009   Chronic kidney disease    kidney stones   CIN III (cervical intraepithelial neoplasia III) 07/1983   tx'd w/cryo- no subsequent abnormal paps until 2008   Essential hypertension    Fibroid 2006   3 cm Right Fundal Fibroid   GERD (gastroesophageal reflux disease) 2011   History of kidney stones    Hypertension    Osteopenia 2020   Synovial cyst of lumbar spine 10/2014   Vitamin D deficiency     Past Surgical History:  Procedure Laterality Date   ABDOMINAL HYSTERECTOMY  02/23/11   R TLH-fibroids, adenomyosis 221 g, & focal CIN I w/clear margins   ANTERIOR CERVICAL DECOMP/DISCECTOMY FUSION N/A 07/19/2012   Procedure: ANTERIOR CERVICAL DECOMPRESSION/DISCECTOMY FUSION 2 LEVELS;  Surgeon: Erline Levine, MD;  Location: [facility];  Service: Neurosurgery;  Laterality: N/A;  Anterior Cervical Five-Six Cervical Six-Seven Anterior Cervical Decompression/Diskectomy/Fusion   COLONOSCOPY     CYSTOSCOPY  02/23/2011   PT.STATES SHE DID NOT HAVE   dental implants     LASIK  '99    LUMBAR LAMINECTOMY/DECOMPRESSION MICRODISCECTOMY Right 02/05/2015   Procedure: Right Lumbar four-five Laminectomy with Resection of Synovial Cyst ;  Surgeon: Erline Levine, MD;  Location: [facility];  Service: Neurosurgery;  Laterality: Right;   TONSILLECTOMY AND ADENOIDECTOMY     VAGINAL DELIVERY  '92 '94    Current Medications: Current Meds  Medication Sig   Calcium Carb-Cholecalciferol (CALCIUM 500+D3 PO) Take 1 tablet by mouth daily.   estradiol (ESTRACE) 0.5 MG tablet Take 1 tablet (0.5 mg total) by mouth daily.   hydrochlorothiazide (HYDRODIURIL) 12.5 MG tablet Take 1 tablet (12.5 mg total) by mouth in the morning.   losartan (COZAAR) 25 MG tablet Take 1 tablet (25 mg total) by mouth daily.   Multiple Vitamins-Minerals (MULTIVITAMINS THER. W/MINERALS) TABS Take 1 tablet by mouth daily.   rosuvastatin (CRESTOR) 5 MG tablet Take 1 tablet (5 mg total) by mouth daily.   Wheat Dextrin (BENEFIBER PO) Take by mouth. Takes two teas spoon daily     Allergies:   Codeine and Ultram [tramadol]   Social History   Socioeconomic History   Marital status: Divorced    Spouse name: Not on file   Number of children: 2   Years of education: Not on file   Highest education level: Not on file  Occupational History   Occupation: Therapist, sports    Employer: [employer]  Tobacco Use   Smoking status: Never  Smokeless tobacco: Never  Vaping Use   Vaping Use: Never used  Substance and Sexual Activity   Alcohol use: Yes    Alcohol/week: 10.0 - 12.0 standard drinks of alcohol    Types: 10 - 12 Glasses of wine per week    Comment: 10-12 glasses of wine a week   Drug use: No   Sexual activity: Not Currently    Partners: Male    Birth control/protection: Post-menopausal, Surgical    Comment: R-TLH  Other Topics Concern   Not on file  Social History Narrative   Not on file   Social Determinants of Health   Financial Resource Strain: Not on file  Food Insecurity: Not on file  Transportation Needs:  Not on file  Physical Activity: Not on file  Stress: Not on file  Social Connections: Not on file     Family History: The patient's family history includes Cancer in her mother; Heart attack in her father; Heart disease in her mother; Hypertension in her father and mother. There is no history of Colon cancer, Esophageal cancer, Pancreatic cancer, Prostate cancer, Rectal cancer, Stomach cancer, or Breast cancer.  ROS:   Please see the history of present illness.    Status post total knee with excellent recuperation.  All other systems reviewed and are negative.  EKGs/Labs/Other Studies Reviewed:    The following studies were reviewed today:  CORONARY CALCIUM SCORE 06/19/2021: IMPRESSION: Coronary calcium score of 32.3. This was 67th percentile for age-, race-, and sex-matched controls.  EKG:  EKG normal sinus rhythm with normal EKG appearance.  Recent Labs: 04/10/2021: ALT 15; BUN 9; Creatinine, Ser 0.64; Potassium 4.2; Sodium 141  Recent Lipid Panel    Component Value Date/Time   CHOL 188 04/10/2021 1031   TRIG 111 04/10/2021 1031   HDL 88 04/10/2021 1031   CHOLHDL 2.1 04/10/2021 1031   LDLCALC 81 04/10/2021 1031    Physical Exam:    VS:  BP 116/82   Pulse 82   Ht '5\' 6"'$  (1.676 m)   Wt 128 lb 9.6 oz (58.3 kg)   LMP 02/02/2011   SpO2 98%   BMI 20.76 kg/m     Wt Readings from Last 3 Encounters:  01/22/22 128 lb 9.6 oz (58.3 kg)  05/22/21 135 lb 6.4 oz (61.4 kg)  04/10/21 137 lb (62.1 kg)     GEN: Healthy in appearance. No acute distress HEENT: Normal NECK: No JVD. LYMPHATICS: No lymphadenopathy CARDIAC: No murmur. RRR no gallop, or edema. VASCULAR:  Normal Pulses. No bruits. RESPIRATORY:  Clear to auscultation without rales, wheezing or rhonchi  ABDOMEN: Soft, non-tender, non-distended, No pulsatile mass, MUSCULOSKELETAL: No deformity  SKIN: Warm and dry NEUROLOGIC:  Alert and oriented x 3 PSYCHIATRIC:  Normal affect   ASSESSMENT:    1. Coronary artery  calcification seen on CT scan   2. Hyperlipidemia LDL goal <70   3. Primary hypertension   4. Family history of early CAD   4. Vitamin D deficiency   6. Osteopenia, unspecified location    PLAN:    In order of problems listed above:  No symptoms of coronary disease.  Secondary prevention discussed. Lipid panel today.  Liver panel today. Excellent blood pressure control.  Target 130/80.  Continue current therapy. Noted  Overall education and awareness concerning primary/secondary risk prevention was discussed in detail: LDL less than 70, hemoglobin A1c less than 7, blood pressure target less than 130/80 mmHg, >150 minutes of moderate aerobic activity per week, avoidance of  smoking, weight control (via diet and exercise), and continued surveillance/management of/for obstructive sleep apnea.  We will check a vitamin D level today.  I recommended Dr. Johney Frame.  Medication Adjustments/Labs and Tests Ordered: Current medicines are reviewed at length with the patient today.  Concerns regarding medicines are outlined above.  Orders Placed This Encounter  Procedures   Lipid panel   Comprehensive metabolic panel   Vitamin D 1,25 dihydroxy   EKG 12-Lead   No orders of the defined types were placed in this encounter.   Patient Instructions  Medication Instructions:  Your physician recommends that you continue on your current medications as directed. Please refer to the Current Medication list given to you today.  *If you need a refill on your cardiac medications before your next appointment, please call your pharmacy*  Lab Work: TODAY: CMET, Lipid panel, vitamin D If you have labs (blood work) drawn today and your tests are completely normal, you will receive your results only by: [place] (if you have MyChart) OR A paper copy in the mail If you have any lab test that is abnormal or we need to change your treatment, we will call you to review the results.  Follow-Up: At  [facility], you and your health needs are our priority.  As part of our continuing mission to provide you with exceptional heart care, we have created designated Provider Care Teams.  These Care Teams include your primary Cardiologist (physician) and Advanced Practice Providers (APPs -  Physician Assistants and Nurse Practitioners) who all work together to provide you with the care you need, when you need it.  Your next appointment:   1 year(s)  The format for your next appointment:   In Person  Provider:   Gwyndolyn Kaufman, MD  Important Information About Sugar         Signed, Sinclair Grooms, MD  01/22/2022 9:35 AM    [place]

## 2022-01-21 DIAGNOSIS — Z96651 Presence of right artificial knee joint: Secondary | ICD-10-CM | POA: Diagnosis not present

## 2022-01-21 DIAGNOSIS — I700 Atherosclerosis of aorta: Secondary | ICD-10-CM | POA: Diagnosis not present

## 2022-01-21 DIAGNOSIS — I2510 Atherosclerotic heart disease of native coronary artery without angina pectoris: Secondary | ICD-10-CM | POA: Diagnosis not present

## 2022-01-21 DIAGNOSIS — E7800 Pure hypercholesterolemia, unspecified: Secondary | ICD-10-CM | POA: Diagnosis not present

## 2022-01-21 DIAGNOSIS — K219 Gastro-esophageal reflux disease without esophagitis: Secondary | ICD-10-CM | POA: Diagnosis not present

## 2022-01-21 DIAGNOSIS — E559 Vitamin D deficiency, unspecified: Secondary | ICD-10-CM | POA: Diagnosis not present

## 2022-01-21 DIAGNOSIS — M8580 Other specified disorders of bone density and structure, unspecified site: Secondary | ICD-10-CM | POA: Diagnosis not present

## 2022-01-21 DIAGNOSIS — H25013 Cortical age-related cataract, bilateral: Secondary | ICD-10-CM | POA: Diagnosis not present

## 2022-01-21 DIAGNOSIS — Z0000 Encounter for general adult medical examination without abnormal findings: Secondary | ICD-10-CM | POA: Diagnosis not present

## 2022-01-21 DIAGNOSIS — Z780 Asymptomatic menopausal state: Secondary | ICD-10-CM | POA: Diagnosis not present

## 2022-01-21 DIAGNOSIS — I10 Essential (primary) hypertension: Secondary | ICD-10-CM | POA: Diagnosis not present

## 2022-01-21 DIAGNOSIS — N890 Mild vaginal dysplasia: Secondary | ICD-10-CM | POA: Diagnosis not present

## 2022-01-22 ENCOUNTER — Ambulatory Visit: Payer: PPO | Attending: Interventional Cardiology | Admitting: Interventional Cardiology

## 2022-01-22 ENCOUNTER — Encounter: Payer: Self-pay | Admitting: Interventional Cardiology

## 2022-01-22 VITALS — BP 116/82 | HR 82 | Ht 66.0 in | Wt 128.6 lb

## 2022-01-22 DIAGNOSIS — E559 Vitamin D deficiency, unspecified: Secondary | ICD-10-CM

## 2022-01-22 DIAGNOSIS — Z8249 Family history of ischemic heart disease and other diseases of the circulatory system: Secondary | ICD-10-CM | POA: Diagnosis not present

## 2022-01-22 DIAGNOSIS — M8580 Other specified disorders of bone density and structure, unspecified site: Secondary | ICD-10-CM | POA: Diagnosis not present

## 2022-01-22 DIAGNOSIS — E785 Hyperlipidemia, unspecified: Secondary | ICD-10-CM | POA: Diagnosis not present

## 2022-01-22 DIAGNOSIS — I10 Essential (primary) hypertension: Secondary | ICD-10-CM | POA: Diagnosis not present

## 2022-01-22 DIAGNOSIS — I2510 Atherosclerotic heart disease of native coronary artery without angina pectoris: Secondary | ICD-10-CM | POA: Diagnosis not present

## 2022-01-22 NOTE — Patient Instructions (Addendum)
Medication Instructions:  Your physician recommends that you continue on your current medications as directed. Please refer to the Current Medication list given to you today.  *If you need a refill on your cardiac medications before your next appointment, please call your pharmacy*  Lab Work: TODAY: Lipid panel, vitamin D If you have labs (blood work) drawn today and your tests are completely normal, you will receive your results only by: [place] (if you have MyChart) OR A paper copy in the mail If you have any lab test that is abnormal or we need to change your treatment, we will call you to review the results.  Follow-Up: At [facility], you and your health needs are our priority.  As part of our continuing mission to provide you with exceptional heart care, we have created designated Provider Care Teams.  These Care Teams include your primary Cardiologist (physician) and Advanced Practice Providers (APPs -  Physician Assistants and Nurse Practitioners) who all work together to provide you with the care you need, when you need it.  Your next appointment:   1 year(s)  The format for your next appointment:   In Person  Provider:   Gwyndolyn Kaufman, MD  Important Information About Sugar

## 2022-01-22 NOTE — Addendum Note (Signed)
Addended by: Molli Barrows on: 01/22/2022 09:39 AM   Modules accepted: Orders

## 2022-01-26 DIAGNOSIS — C44629 Squamous cell carcinoma of skin of left upper limb, including shoulder: Secondary | ICD-10-CM | POA: Diagnosis not present

## 2022-01-29 LAB — LIPID PANEL
Chol/HDL Ratio: 2 ratio (ref 0.0–4.4)
Cholesterol, Total: 185 mg/dL (ref 100–199)
HDL: 94 mg/dL (ref >39)
LDL Chol Calc (NIH): 78 mg/dL (ref 0–99)
Triglycerides: 69 mg/dL (ref 0–149)
VLDL Cholesterol Cal: 13 mg/dL (ref 5–40)

## 2022-01-29 LAB — VITAMIN D 1,25 DIHYDROXY
Vitamin D 1, 25 (OH)2 Total: 60 pg/mL
Vitamin D2 1, 25 (OH)2: <10 pg/mL
Vitamin D3 1, 25 (OH)2: 53 pg/mL

## 2022-02-03 ENCOUNTER — Encounter: Payer: Self-pay | Admitting: Interventional Cardiology

## 2022-02-04 DIAGNOSIS — I10 Essential (primary) hypertension: Secondary | ICD-10-CM | POA: Diagnosis not present

## 2022-02-04 DIAGNOSIS — H2511 Age-related nuclear cataract, right eye: Secondary | ICD-10-CM | POA: Diagnosis not present

## 2022-02-04 DIAGNOSIS — H43812 Vitreous degeneration, left eye: Secondary | ICD-10-CM | POA: Diagnosis not present

## 2022-02-04 DIAGNOSIS — H2513 Age-related nuclear cataract, bilateral: Secondary | ICD-10-CM | POA: Diagnosis not present

## 2022-02-04 DIAGNOSIS — H25043 Posterior subcapsular polar age-related cataract, bilateral: Secondary | ICD-10-CM | POA: Diagnosis not present

## 2022-02-11 DIAGNOSIS — H43812 Vitreous degeneration, left eye: Secondary | ICD-10-CM | POA: Diagnosis not present

## 2022-02-11 DIAGNOSIS — H2511 Age-related nuclear cataract, right eye: Secondary | ICD-10-CM | POA: Diagnosis not present

## 2022-02-11 DIAGNOSIS — I10 Essential (primary) hypertension: Secondary | ICD-10-CM | POA: Diagnosis not present

## 2022-02-11 DIAGNOSIS — H2513 Age-related nuclear cataract, bilateral: Secondary | ICD-10-CM | POA: Diagnosis not present

## 2022-02-11 DIAGNOSIS — H25043 Posterior subcapsular polar age-related cataract, bilateral: Secondary | ICD-10-CM | POA: Diagnosis not present

## 2022-02-18 ENCOUNTER — Other Ambulatory Visit (HOSPITAL_COMMUNITY): Payer: Self-pay

## 2022-02-24 ENCOUNTER — Encounter (INDEPENDENT_AMBULATORY_CARE_PROVIDER_SITE_OTHER): Payer: PPO | Admitting: Ophthalmology

## 2022-02-24 DIAGNOSIS — H35411 Lattice degeneration of retina, right eye: Secondary | ICD-10-CM | POA: Diagnosis not present

## 2022-02-24 DIAGNOSIS — H35033 Hypertensive retinopathy, bilateral: Secondary | ICD-10-CM

## 2022-02-24 DIAGNOSIS — H43813 Vitreous degeneration, bilateral: Secondary | ICD-10-CM

## 2022-02-24 DIAGNOSIS — I10 Essential (primary) hypertension: Secondary | ICD-10-CM | POA: Diagnosis not present

## 2022-03-05 DIAGNOSIS — Z85828 Personal history of other malignant neoplasm of skin: Secondary | ICD-10-CM | POA: Diagnosis not present

## 2022-03-05 DIAGNOSIS — D485 Neoplasm of uncertain behavior of skin: Secondary | ICD-10-CM | POA: Diagnosis not present

## 2022-03-05 DIAGNOSIS — C44722 Squamous cell carcinoma of skin of right lower limb, including hip: Secondary | ICD-10-CM | POA: Diagnosis not present

## 2022-03-05 DIAGNOSIS — Z5189 Encounter for other specified aftercare: Secondary | ICD-10-CM | POA: Diagnosis not present

## 2022-03-13 ENCOUNTER — Encounter: Payer: Self-pay | Admitting: Gastroenterology

## 2022-03-19 HISTORY — PX: OTHER SURGICAL HISTORY: SHX169

## 2022-03-24 DIAGNOSIS — H2511 Age-related nuclear cataract, right eye: Secondary | ICD-10-CM | POA: Diagnosis not present

## 2022-03-24 DIAGNOSIS — H269 Unspecified cataract: Secondary | ICD-10-CM | POA: Diagnosis not present

## 2022-03-31 DIAGNOSIS — H269 Unspecified cataract: Secondary | ICD-10-CM | POA: Diagnosis not present

## 2022-05-20 NOTE — Progress Notes (Signed)
67 y.o. G95P2002 Divorced Caucasian female here for annual exam.    Patient is followed for VAIN II, followed with observational management.  Colpo biopsy with GYN ONC 2023 showed LGSIL.  She is also followed for ERT.  She wants to continue.   Dealing with constipation.  Seeing GI for reflux.   Had a total right knee replacement, Mohs surgeries, and cataract surgery.  PCP:   Dr. Jacqulyn Bath  Patient's last menstrual period was 02/02/2011.           Sexually active: No.  The current method of family planning is status post hysterectomy.    Exercising: Yes.     [place] aerobics, bike and walking Smoker:  no  Health Maintenance: Pap:  12/30/20 LSIL: HR HPV positive, 12-15-17 Vag.pap ASCUS:Pos HR HPV,11-18-16 Vag.pap ASCUS:Pos HR HPV, 04-29-16 Vag pap Neg:Pos HR HPV;neg 16/18/45, 04-17-15 LGSIL:Pos HR HPV  History of abnormal Pap:  yes, prior colpo showed VAIN II MMG:  08/11/21 Breast Density Category B, BI-RADS CAT 1 neg Colonoscopy:  11/09/16 polyp removed BMD:   09/18/21  Result  osteopenic  TDaP:  up to date Gardasil:   no HIV: donated blood Hep C: donated blood Screening Labs: PCP   reports that she has never smoked. She has never used smokeless tobacco. She reports current alcohol use of about 10.0 - 12.0 standard drinks of alcohol per week. She reports that she does not use drugs.  Past Medical History:  Diagnosis Date   Allergy    Anxiety    Through divorce   Arthritis    ASCUS with positive high risk HPV 12/2006   Negative Colpo BX   ASCUS with positive high risk HPV 2009   Chronic kidney disease    kidney stones   CIN III (cervical intraepithelial neoplasia III) 07/1983   tx'd w/cryo- no subsequent abnormal paps until 2008   Essential hypertension    Fibroid 2006   3 cm Right Fundal Fibroid   GERD (gastroesophageal reflux disease) 2011   History of kidney stones    Hypertension    Osteopenia 2020   Synovial cyst of lumbar spine 10/2014   Vitamin D deficiency      Past Surgical History:  Procedure Laterality Date   ABDOMINAL HYSTERECTOMY  02/23/2011   R TLH-fibroids, adenomyosis 221 g, & focal CIN I w/clear margins   ANTERIOR CERVICAL DECOMP/DISCECTOMY FUSION N/A 07/19/2012   Procedure: ANTERIOR CERVICAL DECOMPRESSION/DISCECTOMY FUSION 2 LEVELS;  Surgeon: Maeola Harman, MD;  Location: [facility];  Service: Neurosurgery;  Laterality: N/A;  Anterior Cervical Five-Six Cervical Six-Seven Anterior Cervical Decompression/Diskectomy/Fusion   cataracts Bilateral 2023   COLONOSCOPY     CYSTOSCOPY  02/23/2011   PT.STATES SHE DID NOT HAVE   dental implants     LASIK  '99   LUMBAR LAMINECTOMY/DECOMPRESSION MICRODISCECTOMY Right 02/05/2015   Procedure: Right Lumbar four-five Laminectomy with Resection of Synovial Cyst ;  Surgeon: Maeola Harman, MD;  Location: [facility];  Service: Neurosurgery;  Laterality: Right;   MOLE REMOVAL  2023   REPLACEMENT TOTAL KNEE Right 09/2021   TONSILLECTOMY AND ADENOIDECTOMY     VAGINAL DELIVERY  '92 '94    Current Outpatient Medications  Medication Sig Dispense Refill   amoxicillin (AMOXIL) 875 MG tablet Take 1 tablet (875 mg total) by mouth every 12 (twelve) hours. 14 tablet 0   Calcium Carb-Cholecalciferol (CALCIUM 500+D3 PO) Take 1 tablet by mouth daily.     estradiol (ESTRACE) 0.5 MG tablet Take 1 tablet (0.5  mg total) by mouth daily. 90 tablet 2   hydrochlorothiazide (HYDRODIURIL) 12.5 MG tablet Take 1 tablet (12.5 mg total) by mouth in the morning. 90 tablet 2   losartan (COZAAR) 25 MG tablet Take 1 tablet (25 mg total) by mouth daily. 90 tablet 2   Multiple Vitamins-Minerals (MULTIVITAMINS THER. W/MINERALS) TABS Take 1 tablet by mouth daily.     rosuvastatin (CRESTOR) 5 MG tablet Take 1 tablet (5 mg total) by mouth daily. 90 tablet 2   Wheat Dextrin (BENEFIBER PO) Take by mouth. Takes two teas spoon daily     No current facility-administered medications for this visit.    Family History  Problem Relation  Age of Onset   Hypertension Mother    Heart disease Mother    Cancer Mother        lung   Hypertension Father    Heart attack Father    Colon cancer Neg Hx    Esophageal cancer Neg Hx    Pancreatic cancer Neg Hx    Prostate cancer Neg Hx    Rectal cancer Neg Hx    Stomach cancer Neg Hx    Breast cancer Neg Hx     Review of Systems  All other systems reviewed and are negative.   Exam:   BP 116/72 (BP Location: Right Arm, Patient Position: Sitting, Cuff Size: Normal)   Pulse 65   Ht  (1.702 m)   Wt 136 lb (61.7 kg)   LMP 02/02/2011   SpO2 98%   BMI 21.30 kg/m     General appearance: alert, cooperative and appears stated age Head: normocephalic, without obvious abnormality, atraumatic Neck: no adenopathy, supple, symmetrical, trachea midline and thyroid normal to inspection and palpation Lungs: clear to auscultation bilaterally Breasts: normal appearance, no masses or tenderness, No nipple retraction or dimpling, No nipple discharge or bleeding, No axillary adenopathy Heart: regular rate and rhythm Abdomen: soft, non-tender; no masses, no organomegaly Extremities: extremities normal, atraumatic, no cyanosis or edema Skin: skin color, texture, turgor normal. No rashes or lesions Lymph nodes: cervical, supraclavicular, and axillary nodes normal. Neurologic: grossly normal  Pelvic: External genitalia:  no lesions              No abnormal inguinal nodes palpated.              Urethra:  normal appearing urethra with no masses, tenderness or lesions              Bartholins and Skenes: normal                 Vagina: normal appearing vagina with normal color and discharge, no lesions              Cervix:  absent.              Pap taken: yes Bimanual Exam:  Uterus:  normal size, contour, position, consistency, mobility, non-tender              Adnexa: no mass, fullness, tenderness              Rectal exam: yes.  Confirms.              Anus:  normal sphincter tone, no  lesions  Chaperone was present for exam:  Warren Lacy, CMA  Assessment:   Well woman visit with gynecologic exam. Status post robotic TLH.  Tubes and ovaries remain.  Hx CIN III.  Long standing VAIN II.  Not treated.  Oncology  recommending pap and HR HPV yearly.  Osteopenia.  PCP following.  ERT. Encounter for medication monitoring.   Plan: Mammogram screening discussed. Self breast awareness reviewed. Pap and HR HPV collected. Guidelines for Calcium, Vitamin D, regular exercise program including cardiovascular and weight bearing exercise. Rx for estradiol 0.5 mg daily.  #90, RF 3. I discussed benefits and risks of stroke, DVT, and PE. Consider vaginal estrogen cream 1/2 gram three times weekly for 6 weeks if needs colposcopy done.   BMD 2025.  Follow up annually and prn.   After visit summary provided.   32 min  total time was spent for this patient encounter, including preparation, face-to-face counseling with the patient, coordination of care, and documentation of the encounter.

## 2022-05-25 NOTE — Progress Notes (Signed)
Debra Nguyen    [id number]    Jan 05, 1956  Primary Care Physician:Pahwani, Kasandra Knudsen, MD  Referring Physician: Ollen Bowl, MD [address] [place],  [place] [address]   Chief complaint:  GERD, IBS Diarrhea and constipation Chief Complaint  Patient presents with   Gastroesophageal Reflux    Yearly checkup    Irritable Bowel Syndrome   HPI:67 year old very pleasant female here for follow-up visit for.  GERD, IBS diarrhea and constipation  I last saw her on 04/10/2021. At that time, she was having episodes of diarrhea sometime around November and December 2022, improved with dietary changes and she was taking Benefiber twice daily.  Today, she reports feeling better. We recalled her symptoms of reflux that occurred in December and January. She states that she took Omeprazole 20 mg daily for few weeks and saw relief with it but stopped it for a while which caused her reflux symptoms to reoccur; thus, she's been taking Omeprazole PRN.   She states that her symptoms are not related to a certain foods or activities. She also complains of constipation and states that she has a BM every other day. She states that she uses Benefiber and Metamucil. She states that she gets at least 70 oz of water daily.    she denies any diarrhea, nausea, blood in stool, black stool, vomiting, abdominal pain, bloating, unintentional weight loss, dysphagia.   GI Hx:  EGD November 23, 2018: LA grade B erosive esophagitis biopsy showed increased intraepithelial eosinophils in the distal esophagus biopsies, gastritis, gastric biopsies negative for H. Pylori   She took omeprazole 20 mg daily for 3 months, overall significant improvement of GERD and dyspepsia symptoms   Colonoscopy November 09, 2016: 2 mm adenomatous polyp removed from cecum, hypertrophied anal papilla otherwise unremarkable exam   Current Outpatient Medications:    Calcium Carb-Cholecalciferol (CALCIUM 500+D3  PO), Take 1 tablet by mouth daily., Disp: , Rfl:    estradiol (ESTRACE) 0.5 MG tablet, Take 1 tablet (0.5 mg total) by mouth daily., Disp: 90 tablet, Rfl: 2   hydrochlorothiazide (HYDRODIURIL) 12.5 MG tablet, Take 1 tablet (12.5 mg total) by mouth in the morning., Disp: 90 tablet, Rfl: 2   losartan (COZAAR) 25 MG tablet, Take 1 tablet (25 mg total) by mouth daily., Disp: 90 tablet, Rfl: 2   Multiple Vitamins-Minerals (MULTIVITAMINS THER. W/MINERALS) TABS, Take 1 tablet by mouth daily., Disp: , Rfl:    rosuvastatin (CRESTOR) 5 MG tablet, Take 1 tablet (5 mg total) by mouth daily., Disp: 90 tablet, Rfl: 2   Wheat Dextrin (BENEFIBER PO), Take by mouth. Takes two teas spoon daily, Disp: , Rfl:     Allergies as of 06/05/2022 - Review Complete 01/22/2022  Allergen Reaction Noted   Codeine Itching 02/02/2011   Ultram [tramadol] Itching 12/27/2012    Past Medical History:  Diagnosis Date   Allergy    Anxiety    Through divorce   Arthritis    ASCUS with positive high risk HPV 12/2006   Negative Colpo BX   ASCUS with positive high risk HPV 2009   Chronic kidney disease    kidney stones   CIN III (cervical intraepithelial neoplasia III) 07/1983   tx'd w/cryo- no subsequent abnormal paps until 2008   Essential hypertension    Fibroid 2006   3 cm Right Fundal Fibroid   GERD (gastroesophageal reflux disease) 2011   History of kidney stones  Hypertension    Osteopenia 2020   Synovial cyst of lumbar spine 10/2014   Vitamin D deficiency     Past Surgical History:  Procedure Laterality Date   ABDOMINAL HYSTERECTOMY  02/23/11   R TLH-fibroids, adenomyosis 221 g, & focal CIN I w/clear margins   ANTERIOR CERVICAL DECOMP/DISCECTOMY FUSION N/A 07/19/2012   Procedure: ANTERIOR CERVICAL DECOMPRESSION/DISCECTOMY FUSION 2 LEVELS;  Surgeon: Maeola HarmanJoseph Stern, MD;  Location: [facility];  Service: Neurosurgery;  Laterality: N/A;  Anterior Cervical Five-Six Cervical Six-Seven Anterior Cervical  Decompression/Diskectomy/Fusion   COLONOSCOPY     CYSTOSCOPY  02/23/2011   PT.STATES SHE DID NOT HAVE   dental implants     LASIK  '99   LUMBAR LAMINECTOMY/DECOMPRESSION MICRODISCECTOMY Right 02/05/2015   Procedure: Right Lumbar four-five Laminectomy with Resection of Synovial Cyst ;  Surgeon: Maeola HarmanJoseph Stern, MD;  Location: [facility];  Service: Neurosurgery;  Laterality: Right;   TONSILLECTOMY AND ADENOIDECTOMY     VAGINAL DELIVERY  '92 '94    Family History  Problem Relation Age of Onset   Hypertension Mother    Heart disease Mother    Cancer Mother        lung   Hypertension Father    Heart attack Father    Colon cancer Neg Hx    Esophageal cancer Neg Hx    Pancreatic cancer Neg Hx    Prostate cancer Neg Hx    Rectal cancer Neg Hx    Stomach cancer Neg Hx    Breast cancer Neg Hx     Social History   Socioeconomic History   Marital status: Divorced    Spouse name: Not on file   Number of children: 2   Years of education: Not on file   Highest education level: Not on file  Occupational History   Occupation: Teacher, adult educationN    Employer:   Tobacco Use   Smoking status: Never   Smokeless tobacco: Never  Vaping Use   Vaping Use: Never used  Substance and Sexual Activity   Alcohol use: Yes    Alcohol/week: 10.0 - 12.0 standard drinks of alcohol    Types: 10 - 12 Glasses of wine per week    Comment: 10-12 glasses of wine a week   Drug use: No   Sexual activity: Not Currently    Partners: Male    Birth control/protection: Post-menopausal, Surgical    Comment: R-TLH  Other Topics Concern   Not on file  Social History Narrative   Not on file   Social Determinants of Health   Financial Resource Strain: Not on file  Food Insecurity: Not on file  Transportation Needs: Not on file  Physical Activity: Not on file  Stress: Not on file  Social Connections: Not on file  Intimate Partner Violence: Not on file      Review of systems: Review of Systems   Constitutional:  Negative for appetite change and fever.  HENT:  Negative for trouble swallowing.   Respiratory:  Negative for cough and shortness of breath.   Cardiovascular:  Negative for chest pain.  Gastrointestinal:  Positive for constipation. Negative for abdominal distention, abdominal pain, anal bleeding, blood in stool, diarrhea, nausea, rectal pain and vomiting.       +reflux  Genitourinary:  Negative for dysuria.  Musculoskeletal:  Negative for back pain.  Skin:  Negative for rash.  Neurological:  Negative for weakness.  All other systems reviewed and are negative.    Physical Exam: General: well-appearing  Eyes: sclera anicteric, no redness CV: RRR, no JVD, no peripheral edema Resp: clear to auscultation bilaterally, normal RR and effort noted GI: soft, LLQ tenderness, with active bowel sounds. No guarding or palpable organomegaly noted. Skin; warm and dry, no rash or jaundice noted Neuro: awake, alert and oriented x 3. Normal gross motor function and fluent speech    Data Reviewed:  Reviewed labs, radiology imaging, old records and pertinent past GI work up   Assessment and Plan/Recommendations:  67 year old very pleasant female here for follow-up visit for GERD, IBS  and abdominal cramping  IBS constipation and diarrhea: Continue with dietary modifications Use Benefiber 1 tablespoon 2-3 times daily with meals Add stool softener, mag oxide or Colace at bedtime as needed  Use Levsin up to 3 times daily as needed for severe abdominal cramping  GERD: Continue with dietary changes and lifestyle modifications Antireflux measures Use low-dose omeprazole 20 mg daily in the morning and Pepcid 20 mg at bedtime as needed for breakthrough heartburn  Return in 1-2 year or sooner if needed   This visit required 30 minutes of patient care (this includes precharting, chart review, review of results, face-to-face time used for counseling as well as treatment plan and  follow-up. The patient was provided an opportunity to ask questions and all were answered. The patient agreed with the plan and demonstrated an understanding of the instructions.  I,Safa M Kadhim,acting as a scribe for Marsa Aris, MD.,have documented all relevant documentation on the behalf of Marsa Aris, MD,as directed by  Marsa Aris, MD while in the presence of Marsa Aris, MD.   I, Marsa Aris, MD, have reviewed all documentation for this visit. The documentation on 05/25/22 for the exam, diagnosis, procedures, and orders are all accurate and complete.   Iona Beard , MD  CC: Ollen Bowl, MD

## 2022-06-01 ENCOUNTER — Other Ambulatory Visit (HOSPITAL_COMMUNITY): Payer: Self-pay

## 2022-06-01 MED ORDER — AMOXICILLIN 875 MG PO TABS
875.0000 mg | ORAL_TABLET | Freq: Two times a day (BID) | ORAL | 0 refills | Status: DC
  Filled 2022-06-01: qty 14, 7d supply, fill #0

## 2022-06-03 ENCOUNTER — Ambulatory Visit (INDEPENDENT_AMBULATORY_CARE_PROVIDER_SITE_OTHER): Payer: PPO | Admitting: Obstetrics and Gynecology

## 2022-06-03 ENCOUNTER — Encounter: Payer: Self-pay | Admitting: Obstetrics and Gynecology

## 2022-06-03 ENCOUNTER — Other Ambulatory Visit (HOSPITAL_COMMUNITY)
Admission: RE | Admit: 2022-06-03 | Discharge: 2022-06-03 | Disposition: A | Discharge status: Home / Self Care | Payer: PPO | Source: Ambulatory Visit | Attending: Obstetrics and Gynecology | Admitting: Obstetrics and Gynecology

## 2022-06-03 ENCOUNTER — Other Ambulatory Visit (HOSPITAL_COMMUNITY): Payer: Self-pay

## 2022-06-03 VITALS — BP 116/72 | HR 65 | Ht 67.0 in | Wt 136.0 lb

## 2022-06-03 DIAGNOSIS — Z124 Encounter for screening for malignant neoplasm of cervix: Secondary | ICD-10-CM | POA: Diagnosis not present

## 2022-06-03 DIAGNOSIS — Z1151 Encounter for screening for human papillomavirus (HPV): Secondary | ICD-10-CM | POA: Insufficient documentation

## 2022-06-03 DIAGNOSIS — R87810 Cervical high risk human papillomavirus (HPV) DNA test positive: Secondary | ICD-10-CM | POA: Diagnosis not present

## 2022-06-03 DIAGNOSIS — Z01411 Encounter for gynecological examination (general) (routine) with abnormal findings: Secondary | ICD-10-CM | POA: Insufficient documentation

## 2022-06-03 DIAGNOSIS — N891 Moderate vaginal dysplasia: Secondary | ICD-10-CM | POA: Insufficient documentation

## 2022-06-03 DIAGNOSIS — Z9189 Other specified personal risk factors, not elsewhere classified: Secondary | ICD-10-CM | POA: Diagnosis not present

## 2022-06-03 DIAGNOSIS — Z01419 Encounter for gynecological examination (general) (routine) without abnormal findings: Secondary | ICD-10-CM

## 2022-06-03 DIAGNOSIS — Z5181 Encounter for therapeutic drug level monitoring: Secondary | ICD-10-CM

## 2022-06-03 DIAGNOSIS — Z79899 Other long term (current) drug therapy: Secondary | ICD-10-CM | POA: Diagnosis not present

## 2022-06-03 MED ORDER — ESTRADIOL 0.5 MG PO TABS
0.5000 mg | ORAL_TABLET | Freq: Every day | ORAL | 3 refills | Status: DC
  Filled 2022-06-03: qty 90, 90d supply, fill #0
  Filled 2022-09-06: qty 90, 90d supply, fill #1
  Filled 2022-12-04: qty 90, 90d supply, fill #2
  Filled 2023-03-04: qty 90, 90d supply, fill #3

## 2022-06-03 NOTE — Patient Instructions (Signed)

## 2022-06-05 ENCOUNTER — Ambulatory Visit: Payer: PPO | Admitting: Gastroenterology

## 2022-06-05 ENCOUNTER — Encounter: Payer: Self-pay | Admitting: Gastroenterology

## 2022-06-05 VITALS — BP 138/80 | HR 80 | Ht 66.0 in | Wt 135.4 lb

## 2022-06-05 DIAGNOSIS — K219 Gastro-esophageal reflux disease without esophagitis: Secondary | ICD-10-CM

## 2022-06-05 DIAGNOSIS — K582 Mixed irritable bowel syndrome: Secondary | ICD-10-CM | POA: Diagnosis not present

## 2022-06-05 DIAGNOSIS — K581 Irritable bowel syndrome with constipation: Secondary | ICD-10-CM

## 2022-06-05 NOTE — Patient Instructions (Signed)
Use Omeprazole 20 mg daily as needed  Mag oxide 250 mg daily 1-2 capsules daily as needed  Or colace 100 mg daily 102 capsules daily as needed  Follow up in 1 year   I appreciate the  opportunity to care for you  Thank You   Marsa Aris , MD

## 2022-06-09 LAB — CYTOLOGY - PAP
Comment: NEGATIVE
High risk HPV: POSITIVE — AB

## 2022-06-10 ENCOUNTER — Encounter: Payer: Self-pay | Admitting: Gastroenterology

## 2022-06-10 ENCOUNTER — Other Ambulatory Visit: Payer: Self-pay | Admitting: Obstetrics and Gynecology

## 2022-06-10 ENCOUNTER — Other Ambulatory Visit (HOSPITAL_COMMUNITY): Payer: Self-pay

## 2022-06-10 MED ORDER — ESTRADIOL 0.1 MG/GM VA CREA
TOPICAL_CREAM | VAGINAL | 2 refills | Status: DC
  Filled 2022-06-10: qty 42.5, 30d supply, fill #0

## 2022-06-12 ENCOUNTER — Other Ambulatory Visit: Payer: Self-pay

## 2022-06-12 DIAGNOSIS — R87622 Low grade squamous intraepithelial lesion on cytologic smear of vagina (LGSIL): Secondary | ICD-10-CM

## 2022-06-25 ENCOUNTER — Other Ambulatory Visit (HOSPITAL_COMMUNITY): Payer: Self-pay

## 2022-06-29 ENCOUNTER — Encounter (INDEPENDENT_AMBULATORY_CARE_PROVIDER_SITE_OTHER): Payer: PPO | Admitting: Ophthalmology

## 2022-07-08 ENCOUNTER — Encounter (INDEPENDENT_AMBULATORY_CARE_PROVIDER_SITE_OTHER): Payer: PPO | Admitting: Ophthalmology

## 2022-07-08 DIAGNOSIS — H35411 Lattice degeneration of retina, right eye: Secondary | ICD-10-CM | POA: Diagnosis not present

## 2022-07-08 DIAGNOSIS — H35033 Hypertensive retinopathy, bilateral: Secondary | ICD-10-CM | POA: Diagnosis not present

## 2022-07-08 DIAGNOSIS — H43813 Vitreous degeneration, bilateral: Secondary | ICD-10-CM | POA: Diagnosis not present

## 2022-07-08 DIAGNOSIS — I10 Essential (primary) hypertension: Secondary | ICD-10-CM | POA: Diagnosis not present

## 2022-07-14 ENCOUNTER — Other Ambulatory Visit (HOSPITAL_COMMUNITY): Payer: Self-pay

## 2022-07-14 MED ORDER — METRONIDAZOLE 0.75 % EX CREA
TOPICAL_CREAM | Freq: Two times a day (BID) | CUTANEOUS | 3 refills | Status: DC
  Filled 2022-07-14 – 2023-05-24 (×2): qty 45, 15d supply, fill #0

## 2022-07-15 ENCOUNTER — Other Ambulatory Visit: Payer: Self-pay | Admitting: Internal Medicine

## 2022-07-15 ENCOUNTER — Other Ambulatory Visit: Payer: Self-pay

## 2022-07-15 ENCOUNTER — Other Ambulatory Visit (HOSPITAL_COMMUNITY): Payer: Self-pay

## 2022-07-15 DIAGNOSIS — Z0000 Encounter for general adult medical examination without abnormal findings: Secondary | ICD-10-CM

## 2022-07-15 MED ORDER — HYDROCHLOROTHIAZIDE 12.5 MG PO TABS
12.5000 mg | ORAL_TABLET | Freq: Every morning | ORAL | 3 refills | Status: DC
  Filled 2022-07-15: qty 90, 90d supply, fill #0
  Filled 2022-10-12: qty 90, 90d supply, fill #1
  Filled 2023-01-10: qty 90, 90d supply, fill #2
  Filled 2023-04-11: qty 90, 90d supply, fill #3

## 2022-07-16 ENCOUNTER — Other Ambulatory Visit (HOSPITAL_COMMUNITY): Payer: Self-pay

## 2022-07-16 ENCOUNTER — Other Ambulatory Visit: Payer: Self-pay

## 2022-07-17 ENCOUNTER — Encounter: Payer: Self-pay | Admitting: *Deleted

## 2022-07-17 ENCOUNTER — Other Ambulatory Visit (HOSPITAL_COMMUNITY): Payer: Self-pay

## 2022-07-17 MED ORDER — ROSUVASTATIN CALCIUM 5 MG PO TABS
5.0000 mg | ORAL_TABLET | Freq: Every day | ORAL | 3 refills | Status: DC
  Filled 2022-07-17: qty 90, 90d supply, fill #0
  Filled 2022-10-12: qty 90, 90d supply, fill #1
  Filled 2023-01-10: qty 90, 90d supply, fill #2
  Filled 2023-04-11: qty 90, 90d supply, fill #3

## 2022-07-30 ENCOUNTER — Other Ambulatory Visit: Payer: Self-pay

## 2022-07-30 ENCOUNTER — Other Ambulatory Visit (HOSPITAL_COMMUNITY): Payer: Self-pay

## 2022-07-31 ENCOUNTER — Other Ambulatory Visit (HOSPITAL_COMMUNITY): Payer: Self-pay

## 2022-07-31 MED ORDER — LOSARTAN POTASSIUM 25 MG PO TABS
25.0000 mg | ORAL_TABLET | Freq: Every day | ORAL | 1 refills | Status: DC
  Filled 2022-07-31: qty 90, 90d supply, fill #0
  Filled 2022-10-31: qty 90, 90d supply, fill #1

## 2022-09-02 ENCOUNTER — Ambulatory Visit
Admission: RE | Admit: 2022-09-02 | Payer: PPO | Source: Ambulatory Visit | Attending: Internal Medicine | Admitting: Internal Medicine

## 2022-09-02 DIAGNOSIS — Z0000 Encounter for general adult medical examination without abnormal findings: Secondary | ICD-10-CM

## 2022-09-10 ENCOUNTER — Other Ambulatory Visit (HOSPITAL_COMMUNITY): Payer: Self-pay

## 2022-11-24 ENCOUNTER — Other Ambulatory Visit (HOSPITAL_BASED_OUTPATIENT_CLINIC_OR_DEPARTMENT_OTHER): Payer: Self-pay

## 2023-01-18 ENCOUNTER — Other Ambulatory Visit (HOSPITAL_COMMUNITY): Payer: Self-pay

## 2023-01-18 MED ORDER — FLUOROURACIL 5 % EX CREA
1.0000 | TOPICAL_CREAM | Freq: Two times a day (BID) | CUTANEOUS | 0 refills | Status: DC
  Filled 2023-01-18: qty 40, 30d supply, fill #0

## 2023-01-25 ENCOUNTER — Other Ambulatory Visit (HOSPITAL_COMMUNITY): Payer: Self-pay

## 2023-01-25 MED ORDER — BENZONATATE 100 MG PO CAPS
100.0000 mg | ORAL_CAPSULE | Freq: Three times a day (TID) | ORAL | 0 refills | Status: DC | PRN
  Filled 2023-01-25: qty 30, 10d supply, fill #0

## 2023-01-30 ENCOUNTER — Other Ambulatory Visit (HOSPITAL_COMMUNITY): Payer: Self-pay

## 2023-02-01 ENCOUNTER — Other Ambulatory Visit (HOSPITAL_COMMUNITY): Payer: Self-pay

## 2023-02-01 MED ORDER — LOSARTAN POTASSIUM 25 MG PO TABS
25.0000 mg | ORAL_TABLET | Freq: Every day | ORAL | 3 refills | Status: DC
  Filled 2023-02-01: qty 100, 100d supply, fill #0
  Filled 2023-05-07: qty 100, 100d supply, fill #1
  Filled 2023-05-24 – 2023-08-17 (×2): qty 100, 100d supply, fill #0
  Filled 2023-11-23: qty 100, 100d supply, fill #1

## 2023-04-30 ENCOUNTER — Telehealth: Payer: Self-pay | Admitting: Gastroenterology

## 2023-04-30 NOTE — Telephone Encounter (Signed)
 Inbound call from patient stating that she wanted to make an appointment to be seen with Dr. Lavon Paganini. I advised her that she is currently scheduled for June. Patient stated that she was not sure if she could wait for that long to be seen. Patient was offered to be scheduled with PA, she stated she would like to speak to nurse. Patient states she is having issues with gas and  mucus in stool. Patient states it has been going on for 2 months and it has not gotten better. Please advise.

## 2023-04-30 NOTE — Telephone Encounter (Signed)
 Spoke with the patient. She had "food poisoning about 2 months ago that resulted in diarrhea with blood. Took 2 weeks to recover. Since then she has had increasing issues with abdominal distention and worsening irregularity of her bowel movements. She increased the Benefiber to twice daily, continued 70 ounces of water daily and uses Dulcolax or Senakot once to twice weekly. At night she will awaken with an urge to pass gas and has to go to the bathroom because she cannot be certain she will not pass stool. "Something has changed and I am worried."

## 2023-04-30 NOTE — Telephone Encounter (Signed)
 Post GI infection can have irritable bowel syndrome and change in bowel habits, continue with high fiber diet and increased water intake. Please schedule office visit with me next available. Thanks

## 2023-04-30 NOTE — Telephone Encounter (Signed)
 Patient advised.

## 2023-05-11 ENCOUNTER — Other Ambulatory Visit (INDEPENDENT_AMBULATORY_CARE_PROVIDER_SITE_OTHER)

## 2023-05-11 ENCOUNTER — Encounter: Payer: Self-pay | Admitting: Physician Assistant

## 2023-05-11 ENCOUNTER — Ambulatory Visit: Admitting: Physician Assistant

## 2023-05-11 VITALS — BP 138/72 | HR 80 | Ht 66.0 in | Wt 139.1 lb

## 2023-05-11 DIAGNOSIS — K581 Irritable bowel syndrome with constipation: Secondary | ICD-10-CM

## 2023-05-11 DIAGNOSIS — R140 Abdominal distension (gaseous): Secondary | ICD-10-CM | POA: Diagnosis not present

## 2023-05-11 DIAGNOSIS — R109 Unspecified abdominal pain: Secondary | ICD-10-CM

## 2023-05-11 DIAGNOSIS — R198 Other specified symptoms and signs involving the digestive system and abdomen: Secondary | ICD-10-CM

## 2023-05-11 DIAGNOSIS — R194 Change in bowel habit: Secondary | ICD-10-CM

## 2023-05-11 LAB — COMPREHENSIVE METABOLIC PANEL
ALT: 11 U/L (ref 0–35)
AST: 19 U/L (ref 0–37)
Albumin: 4.1 g/dL (ref 3.5–5.2)
Alkaline Phosphatase: 100 U/L (ref 39–117)
BUN: 13 mg/dL (ref 6–23)
CO2: 29 meq/L (ref 19–32)
Calcium: 9.3 mg/dL (ref 8.4–10.5)
Chloride: 100 meq/L (ref 96–112)
Creatinine, Ser: 0.67 mg/dL (ref 0.40–1.20)
GFR: 88.96 mL/min (ref >60.00)
Glucose, Bld: 96 mg/dL (ref 70–99)
Potassium: 3.7 meq/L (ref 3.5–5.1)
Sodium: 136 meq/L (ref 135–145)
Total Bilirubin: 0.4 mg/dL (ref 0.2–1.2)
Total Protein: 8.5 g/dL — ABNORMAL HIGH (ref 6.0–8.3)

## 2023-05-11 LAB — CBC WITH DIFFERENTIAL/PLATELET
Basophils Absolute: 0.1 10*3/uL (ref 0.0–0.1)
Basophils Relative: 0.9 % (ref 0.0–3.0)
Eosinophils Absolute: 0.1 10*3/uL (ref 0.0–0.7)
Eosinophils Relative: 0.9 % (ref 0.0–5.0)
HCT: 37.8 % (ref 36.0–46.0)
Hemoglobin: 12.4 g/dL (ref 12.0–15.0)
Lymphocytes Relative: 18.2 % (ref 12.0–46.0)
Lymphs Abs: 1.3 10*3/uL (ref 0.7–4.0)
MCHC: 32.9 g/dL (ref 30.0–36.0)
MCV: 91.1 fl (ref 78.0–100.0)
Monocytes Absolute: 0.5 10*3/uL (ref 0.1–1.0)
Monocytes Relative: 7.5 % (ref 3.0–12.0)
Neutro Abs: 5.3 10*3/uL (ref 1.4–7.7)
Neutrophils Relative %: 72.5 % (ref 43.0–77.0)
Platelets: 370 10*3/uL (ref 150.0–400.0)
RBC: 4.15 Mil/uL (ref 3.87–5.11)
RDW: 13.3 % (ref 11.5–15.5)
WBC: 7.3 10*3/uL (ref 4.0–10.5)

## 2023-05-11 NOTE — Patient Instructions (Signed)
 Your provider has requested that you go to the basement level for lab work before leaving today. Press "B" on the elevator. The lab is located at the first door on the left as you exit the elevator.   You have been scheduled for a CT scan of the abdomen and pelvis at [facility], 1st floor Radiology. You are scheduled on 05/13/23 at 2:30 pm. You should arrive 15 minutes prior to your appointment time for registration.    Please follow the written instructions below on the day of your exam:   1) Do not eat anything after 12:30 pm (4 hours prior to your test)   You may take any medications as prescribed with a small amount of water, if necessary. If you take any of the following medications: METFORMIN, GLUCOPHAGE, GLUCOVANCE, AVANDAMET, RIOMET, FORTAMET, ACTOPLUS MET, JANUMET, GLUMETZA or METAGLIP, you MAY be asked to HOLD this medication 48 hours AFTER the exam.   The purpose of you drinking the oral contrast is to aid in the visualization of your intestinal tract. The contrast solution may cause some diarrhea. Depending on your individual set of symptoms, you may also receive an intravenous injection of x-ray contrast/dye. Plan on being at [facility] for 45 minutes or longer, depending on the type of exam you are having performed.   If you have any questions regarding your exam or if you need to reschedule, you may call [facility] at [phone] between the hours of 8:00 am and 5:00 pm, Monday-Friday.

## 2023-05-11 NOTE — Progress Notes (Signed)
 Chief Complaint: Change in bowel habits and abdominal distention  HPI:    Debra Nguyen is a 68 year old female with a past medical history as listed below including GERD, IBS and multiple others, known to Dr. Lavon Paganini, who presents to clinic today with a change in bowel habits and abdominal distention.    07/30/2016 CT the abdomen pelvis her left lower quadrant pain with no evidence of bowel obstruction or acute bowel inflammation, simple 1.3 cm left adnexal cyst.  Aortic atherosclerosis.    11/09/2016 colonoscopy with a 2 mm hyperplastic polyp removed from the cecum, hypertrophied anal papula and otherwise unremarkable.  Repeat recommended 10 years.   4 /19/24 patient seen in clinic by Dr. Lavon Paganini and at that time had been seen a year prior for episodes of diarrhea that had improved with dietary changes and taking Benefiber twice daily.  At that visit she was feeling better she had been on Omeprazole 20 mg daily for a few weeks and saw a relief of reflux and stopped it for a while, but it caused increase in symptoms so she continued to as needed.  Felt like her symptoms were not related to certain foods or activities.  Complained of constipation.  She was on Benefiber and Metamucil.  At that visit told to continue dietary modifications and use Benefiber 1 tablespoon 2-3 times a day as well as a stool softener at bedtime as needed.  Discussed Levsin up to 3 times a day for abdominal cramping.  Continue low-dose omeprazole 20 mg in the morning and Pepcid 20 mg at bedtime.    04/30/2023 patient called and described that 2 months prior she had food poisoning which resulted in diarrhea with blood.  It took 2 weeks to recover.  Since then she had increasing issues abdominal distention and worsening regularity of her bowel movements.  Increase Benefiber to twice daily.  Dr. Lavon Paganini advised that post GI infection you can have IBS and a change in bowel habits.    Today, the patient presents to clinic and tells me  that in mid October she was violently ill with diarrhea and vomiting she thought from food poisoning.  She then had an episode of bronchitis requiring antibiotics in December and had a flare of her hemorrhoids which had not really happened since she gave birth.  Tells me that since that time really she has maintained her chronic IBS-C but has been seeing some mucus in her stool every time she defecates.  Sometimes she will pass gas and it is just a glob of mucus.  This has never happened to her before.  She remains on a mixture of Dulcolax/Colace/Senokot as needed to maintain bowel movements every few days but never feels like she has a complete bowel movement.    Along with the above patient tells me over the past 6 years or so she has been feeling a fullness on the left side of her abdomen which is somewhat tender to palpation.    Patient used to work as an Charity fundraiser.    Denies fever, chills, weight loss or blood in her stool.  Past Medical History:  Diagnosis Date   Allergy    Anxiety    Through divorce   Arthritis    ASCUS with positive high risk HPV 12/2006   Negative Colpo BX   ASCUS with positive high risk HPV 2009   Chronic kidney disease    kidney stones   CIN III (cervical intraepithelial neoplasia III) 07/1983   tx'd  w/cryo- no subsequent abnormal paps until 2008   Essential hypertension    Fibroid 2006   3 cm Right Fundal Fibroid   GERD (gastroesophageal reflux disease) 2011   History of kidney stones    Hypertension    Osteopenia 2020   Synovial cyst of lumbar spine 10/2014   Vitamin D deficiency     Past Surgical History:  Procedure Laterality Date   ABDOMINAL HYSTERECTOMY  02/23/2011   R TLH-fibroids, adenomyosis 221 g, & focal CIN I w/clear margins   ANTERIOR CERVICAL DECOMP/DISCECTOMY FUSION N/A 07/19/2012   Procedure: ANTERIOR CERVICAL DECOMPRESSION/DISCECTOMY FUSION 2 LEVELS;  Surgeon: Maeola Harman, MD;  Location: [facility];  Service: Neurosurgery;  Laterality: N/A;   Anterior Cervical Five-Six Cervical Six-Seven Anterior Cervical Decompression/Diskectomy/Fusion   cataracts Bilateral 03/2022   COLONOSCOPY     CYSTOSCOPY  02/23/2011   PT.STATES SHE DID NOT HAVE   dental implants     LASIK  '99   LUMBAR LAMINECTOMY/DECOMPRESSION MICRODISCECTOMY Right 02/05/2015   Procedure: Right Lumbar four-five Laminectomy with Resection of Synovial Cyst ;  Surgeon: Maeola Harman, MD;  Location: [facility];  Service: Neurosurgery;  Laterality: Right;   MOHS SURGERY     x 2   MOLE REMOVAL  2023   REPLACEMENT TOTAL KNEE Right 09/2021   TONSILLECTOMY AND ADENOIDECTOMY     VAGINAL DELIVERY  '92 '94    Current Outpatient Medications  Medication Sig Dispense Refill   amoxicillin (AMOXIL) 875 MG tablet Take 1 tablet (875 mg total) by mouth every 12 (twelve) hours. 14 tablet 0   benzonatate (TESSALON) 100 MG capsule Take 1 capsule (100 mg total) by mouth 3 (three) times daily as needed for 10 days. 30 capsule 0   Calcium Carb-Cholecalciferol (CALCIUM 500+D3 PO) Take 1 tablet by mouth daily.     estradiol (ESTRACE) 0.1 MG/GM vaginal cream Use 1/2 g vaginally three times per week. 42.5 g 2   estradiol (ESTRACE) 0.5 MG tablet Take 1 tablet (0.5 mg total) by mouth daily. 90 tablet 3   fluorouracil (EFUDEX) 5 % cream Apply 1 Application topically 2 (two) times daily for 2 weeks then stop. 40 g 0   hydrochlorothiazide (HYDRODIURIL) 12.5 MG tablet Take 1 tablet (12.5 mg total) by mouth every morning. 90 tablet 3   losartan (COZAAR) 25 MG tablet Take 1 tablet (25 mg total) by mouth daily. 90 tablet 2   losartan (COZAAR) 25 MG tablet Take 1 tablet (25 mg total) by mouth daily. 100 tablet 3   metroNIDAZOLE (METROCREAM) 0.75 % cream Apply 1 application topically to affected area 2 (two) times daily. 45 g 3   Multiple Vitamins-Minerals (MULTIVITAMINS THER. W/MINERALS) TABS Take 1 tablet by mouth daily.     rosuvastatin (CRESTOR) 5 MG tablet Take 1 tablet (5 mg total) by mouth daily. 90  tablet 2   rosuvastatin (CRESTOR) 5 MG tablet Take 1 tablet (5 mg total) by mouth daily. 90 tablet 3   Wheat Dextrin (BENEFIBER PO) Take by mouth. Takes two teas spoon daily     No current facility-administered medications for this visit.    Allergies as of 05/11/2023 - Review Complete 06/10/2022  Allergen Reaction Noted   Codeine Itching 02/02/2011   Ultram [tramadol] Itching 12/27/2012    Family History  Problem Relation Age of Onset   Hypertension Mother    Heart disease Mother    Cancer Mother        lung   Hypertension Father    Heart  attack Father    Colon cancer Neg Hx    Esophageal cancer Neg Hx    Pancreatic cancer Neg Hx    Prostate cancer Neg Hx    Rectal cancer Neg Hx    Stomach cancer Neg Hx    Breast cancer Neg Hx     Social History   Socioeconomic History   Marital status: Divorced    Spouse name: Not on file   Number of children: 2   Years of education: Not on file   Highest education level: Not on file  Occupational History   Occupation: Charity fundraiser    Employer: [employer]  Tobacco Use   Smoking status: Never   Smokeless tobacco: Never  Vaping Use   Vaping status: Never Used  Substance and Sexual Activity   Alcohol use: Yes    Alcohol/week: 10.0 - 12.0 standard drinks of alcohol    Types: 10 - 12 Glasses of wine per week    Comment: 10-12 glasses of wine a week   Drug use: No   Sexual activity: Not Currently    Partners: Male    Birth control/protection: Post-menopausal, Surgical    Comment: R-TLH  Other Topics Concern   Not on file  Social History Narrative   Not on file   Social Drivers of Health   Financial Resource Strain: Not on file  Food Insecurity: Not on file  Transportation Needs: Not on file  Physical Activity: Not on file  Stress: Not on file  Social Connections: Not on file  Intimate Partner Violence: Not on file    Review of Systems:    Constitutional: No weight loss, fever or chills Cardiovascular: No chest  pain Respiratory: No SOB Gastrointestinal: See HPI and otherwise negative   Physical Exam:  Vital signs: BP 138/72   Pulse 80   Ht 5\' 6"  (1.676 m)   Wt 139 lb 2 oz (63.1 kg)   LMP 02/02/2011   SpO2 98%   BMI 22.46 kg/m    Constitutional:   Very Pleasant elderly Caucasian female appears to be in NAD, Well developed, Well nourished, alert and cooperative Respiratory: Respirations even and unlabored. Lungs clear to auscultation bilaterally.   No wheezes, crackles, or rhonchi.  Cardiovascular: Normal S1, S2. No MRG. Regular rate and rhythm. No peripheral edema, cyanosis or pallor.  Gastrointestinal:  Soft, nondistended, moderate left sided TTP, slight irregularity along the left side of her abdomen with fullness on this side compared to the other all the way from her left upper quadrant down to her suprapubic area, no rebound or guarding.  Decreased bowel sounds all 4 quadrants.  Rectal:  Not performed.  Psychiatric: Demonstrates good judgement and reason without abnormal affect or behaviors.  RELEVANT LABS AND IMAGING: CBC    Component Value Date/Time   WBC 5.4 02/01/2015 1328   RBC 4.16 02/01/2015 1328   HGB 13.3 02/01/2015 1328   HCT 39.7 02/01/2015 1328   PLT 228 02/01/2015 1328   MCV 95.4 02/01/2015 1328   MCH 32.0 02/01/2015 1328   MCHC 33.5 02/01/2015 1328   RDW 12.4 02/01/2015 1328    CMP     Component Value Date/Time   NA 141 04/10/2021 1031   K 4.2 04/10/2021 1031   CL 103 04/10/2021 1031   CO2 26 04/10/2021 1031   GLUCOSE 89 04/10/2021 1031   GLUCOSE 94 02/01/2015 1328   BUN 9 04/10/2021 1031   CREATININE 0.64 04/10/2021 1031   CALCIUM 8.8 04/10/2021 1031  PROT 6.1 04/10/2021 1031   ALBUMIN 4.0 04/10/2021 1031   AST 22 04/10/2021 1031   ALT 15 04/10/2021 1031   ALKPHOS 82 04/10/2021 1031   BILITOT 0.4 04/10/2021 1031   GFRNONAA >60 02/01/2015 1328   GFRAA >60 02/01/2015 1328    Assessment: 1.  Left-sided abdominal fullness and tenderness: For the  past 6 years or so, last CT was done in 2018, the side does feel irregular when compared to the right side of her abdomen and there is some tenderness; consider inflammation versus mass versus other 2.  IBS-C: Chronic for the patient typically maintained on a mixture of Dulcolax/Colace/Senokot, this still does not give her regular bowel movements 3.  Change in bowel habits: With mucus over the past 4 to 5 months, consider relation to above versus other  Plan: 1.  Ordered a CT of the abdomen pelvis with contrast for further evaluation of abdominal fullness and pain on the left side of her abdomen.  Also a change in bowel habits. 2.  Ordered CBC and CMP today 3.  Encouraged the patient to start MiraLAX daily.  We discussed twice daily dosing for the next 2 to 3 weeks titrate as needed up to 4 times a day. 4.  Patient will let me know how the above is going, if not working for her we can do a trial of Linzess, would start at the lowest dose. 5.  Patient to follow in clinic per recommendations after imaging and labs above.  Hyacinth Meeker, PA-C [facility] 05/11/2023, 1:27 PM  Cc: Ollen Bowl, MD

## 2023-05-13 ENCOUNTER — Ambulatory Visit (HOSPITAL_COMMUNITY)
Admission: RE | Admit: 2023-05-13 | Discharge: 2023-05-13 | Disposition: A | Discharge status: Home / Self Care | Source: Ambulatory Visit | Attending: Physician Assistant | Admitting: Physician Assistant

## 2023-05-13 DIAGNOSIS — R198 Other specified symptoms and signs involving the digestive system and abdomen: Secondary | ICD-10-CM | POA: Diagnosis not present

## 2023-05-13 DIAGNOSIS — N281 Cyst of kidney, acquired: Secondary | ICD-10-CM | POA: Diagnosis not present

## 2023-05-13 DIAGNOSIS — R590 Localized enlarged lymph nodes: Secondary | ICD-10-CM | POA: Diagnosis not present

## 2023-05-13 DIAGNOSIS — R194 Change in bowel habit: Secondary | ICD-10-CM | POA: Insufficient documentation

## 2023-05-13 DIAGNOSIS — C7889 Secondary malignant neoplasm of other digestive organs: Secondary | ICD-10-CM | POA: Diagnosis not present

## 2023-05-13 DIAGNOSIS — R109 Unspecified abdominal pain: Secondary | ICD-10-CM | POA: Insufficient documentation

## 2023-05-13 DIAGNOSIS — C779 Secondary and unspecified malignant neoplasm of lymph node, unspecified: Secondary | ICD-10-CM | POA: Diagnosis not present

## 2023-05-13 MED ORDER — IOHEXOL 9 MG/ML PO SOLN
1000.0000 mL | ORAL | Status: AC
  Administered 2023-05-13: 1000 mL via ORAL

## 2023-05-13 MED ORDER — IOHEXOL 9 MG/ML PO SOLN
ORAL | Status: AC
  Filled 2023-05-13: qty 1000

## 2023-05-13 MED ORDER — SODIUM CHLORIDE (PF) 0.9 % IJ SOLN
INTRAMUSCULAR | Status: AC
Start: 2023-05-13 — End: ?
  Filled 2023-05-13: qty 50

## 2023-05-13 MED ORDER — IOHEXOL 300 MG/ML  SOLN
100.0000 mL | Freq: Once | INTRAMUSCULAR | Status: AC | PRN
  Administered 2023-05-13: 100 mL via INTRAVENOUS

## 2023-05-22 ENCOUNTER — Telehealth: Payer: Self-pay | Admitting: Nurse Practitioner

## 2023-05-22 NOTE — Telephone Encounter (Signed)
 Patient called answering service to discuss CT scan results which were released to Mychart at 2 am. Unfortunately there is an apple core lesion in rectum and what appears to be widespread mets.  I told her that I would make sure that Hyacinth Meeker, PA and Dr. Lavon Paganini are aware of results first thing Monday morning and that they would figure out next step. Flex sig maybe?

## 2023-05-24 ENCOUNTER — Other Ambulatory Visit: Payer: Self-pay

## 2023-05-24 ENCOUNTER — Encounter: Payer: Self-pay | Admitting: *Deleted

## 2023-05-24 ENCOUNTER — Encounter: Payer: Self-pay | Admitting: Gastroenterology

## 2023-05-24 ENCOUNTER — Other Ambulatory Visit (HOSPITAL_BASED_OUTPATIENT_CLINIC_OR_DEPARTMENT_OTHER): Payer: Self-pay

## 2023-05-24 ENCOUNTER — Other Ambulatory Visit (HOSPITAL_COMMUNITY): Payer: Self-pay

## 2023-05-24 DIAGNOSIS — R933 Abnormal findings on diagnostic imaging of other parts of digestive tract: Secondary | ICD-10-CM

## 2023-05-24 DIAGNOSIS — K639 Disease of intestine, unspecified: Secondary | ICD-10-CM

## 2023-05-24 DIAGNOSIS — R194 Change in bowel habit: Secondary | ICD-10-CM

## 2023-05-24 MED ORDER — NA SULFATE-K SULFATE-MG SULF 17.5-3.13-1.6 GM/177ML PO SOLN
1.0000 | Freq: Once | ORAL | 0 refills | Status: DC
  Filled 2023-05-24: qty 354, 1d supply, fill #0

## 2023-05-24 MED ORDER — ONDANSETRON 4 MG PO TBDP
4.0000 mg | ORAL_TABLET | Freq: Three times a day (TID) | ORAL | 0 refills | Status: DC | PRN
  Filled 2023-05-24 (×2): qty 20, 7d supply, fill #0

## 2023-05-24 NOTE — Telephone Encounter (Signed)
 Case scheduled. Ambulatory referral marked as "urgent." Referral to [facility]. Prep to pharmacy. Instructions in My Chart.

## 2023-05-24 NOTE — Progress Notes (Signed)
 PATIENT NAVIGATOR PROGRESS NOTE  Name: Debra Nguyen Date: 05/24/2023 MRN: [id number]  DOB: Jan 21, 1956   Reason for visit:  New patient appt  Comments:  Called and spoke with patient and have her scheduled with Lonna Cobb NP on Thrusday 05/27/23 at 2:15 pm  We discussed what to expect with appt, directions to building and parking.    Will contact pathology for expedited review of biopsy from 05/25/23    Time spent counseling/coordinating care: 45-60 minutes

## 2023-05-24 NOTE — Telephone Encounter (Signed)
 Called patient and discussed CT results, she is being set up for colonoscopy tomorrow, will also send referral to oncology.  Further management pending biopsy results

## 2023-05-24 NOTE — Telephone Encounter (Signed)
Patient needing to be further advised on prep.

## 2023-05-25 ENCOUNTER — Ambulatory Visit: Admitting: Gastroenterology

## 2023-05-25 ENCOUNTER — Encounter: Payer: Self-pay | Admitting: Gastroenterology

## 2023-05-25 VITALS — BP 147/70 | HR 68 | Temp 97.5°F | Resp 11 | Ht 66.0 in | Wt 139.0 lb

## 2023-05-25 DIAGNOSIS — R194 Change in bowel habit: Secondary | ICD-10-CM | POA: Diagnosis not present

## 2023-05-25 DIAGNOSIS — K56600 Partial intestinal obstruction, unspecified as to cause: Secondary | ICD-10-CM | POA: Diagnosis not present

## 2023-05-25 DIAGNOSIS — C187 Malignant neoplasm of sigmoid colon: Secondary | ICD-10-CM | POA: Diagnosis not present

## 2023-05-25 DIAGNOSIS — R935 Abnormal findings on diagnostic imaging of other abdominal regions, including retroperitoneum: Secondary | ICD-10-CM

## 2023-05-25 DIAGNOSIS — K6289 Other specified diseases of anus and rectum: Secondary | ICD-10-CM

## 2023-05-25 DIAGNOSIS — R933 Abnormal findings on diagnostic imaging of other parts of digestive tract: Secondary | ICD-10-CM | POA: Diagnosis not present

## 2023-05-25 DIAGNOSIS — C19 Malignant neoplasm of rectosigmoid junction: Secondary | ICD-10-CM | POA: Diagnosis not present

## 2023-05-25 DIAGNOSIS — I10 Essential (primary) hypertension: Secondary | ICD-10-CM | POA: Diagnosis not present

## 2023-05-25 MED ORDER — SODIUM CHLORIDE 0.9 % IV SOLN: 500.0000 mL | Freq: Once | INTRAVENOUS | Status: DC

## 2023-05-25 NOTE — Progress Notes (Signed)
 Called to room to assist during endoscopic procedure.  Patient ID and intended procedure confirmed with present staff. Received instructions for my participation in the procedure from the performing physician.

## 2023-05-25 NOTE — Progress Notes (Signed)
 Sedate, gd SR, tolerated procedure well, VSS, report to RN

## 2023-05-25 NOTE — Progress Notes (Signed)
 [facility] History and Physical   Primary Care Physician:  Ollen Bowl, MD   Reason for Procedure:  Abnormal CT, rectal mass  Plan:    colonoscopy with possible interventions as needed     HPI: Debra Nguyen is a very pleasant 68 y.o. female here for colonsocopy for evaluation of abnormal CT with ?metastatic cancer with rectal mass  The risks and benefits as well as alternatives of endoscopic procedure(s) have been discussed and reviewed. All questions answered. The patient agrees to proceed.    Past Medical History:  Diagnosis Date   Allergy    Anxiety    Through divorce   Arthritis    ASCUS with positive high risk HPV 12/2006   Negative Colpo BX   ASCUS with positive high risk HPV 2009   Chronic kidney disease    kidney stones   CIN III (cervical intraepithelial neoplasia III) 07/1983   tx'd w/cryo- no subsequent abnormal paps until 2008   Essential hypertension    Fibroid 2006   3 cm Right Fundal Fibroid   GERD (gastroesophageal reflux disease) 2011   History of kidney stones    Hypertension    Osteopenia 2020   Synovial cyst of lumbar spine 10/2014   Vitamin D deficiency     Past Surgical History:  Procedure Laterality Date   ABDOMINAL HYSTERECTOMY  02/23/2011   R TLH-fibroids, adenomyosis 221 g, & focal CIN I w/clear margins   ANTERIOR CERVICAL DECOMP/DISCECTOMY FUSION N/A 07/19/2012   Procedure: ANTERIOR CERVICAL DECOMPRESSION/DISCECTOMY FUSION 2 LEVELS;  Surgeon: Maeola Harman, MD;  Location: [facility];  Service: Neurosurgery;  Laterality: N/A;  Anterior Cervical Five-Six Cervical Six-Seven Anterior Cervical Decompression/Diskectomy/Fusion   cataracts Bilateral 03/2022   COLONOSCOPY     CYSTOSCOPY  02/23/2011   PT.STATES SHE DID NOT HAVE   dental implants     LASIK  '99   LUMBAR LAMINECTOMY/DECOMPRESSION MICRODISCECTOMY Right 02/05/2015   Procedure: Right Lumbar four-five Laminectomy with Resection of Synovial Cyst ;  Surgeon:  Maeola Harman, MD;  Location: [facility];  Service: Neurosurgery;  Laterality: Right;   MOHS SURGERY     x 2   MOLE REMOVAL  2023   REPLACEMENT TOTAL KNEE Right 09/2021   TONSILLECTOMY AND ADENOIDECTOMY     VAGINAL DELIVERY  '92 '94    Prior to Admission medications   Medication Sig Start Date End Date Taking? Authorizing Provider  Calcium Carb-Cholecalciferol (CALCIUM 500+D3 PO) Take 1 tablet by mouth daily.   Yes [provider]  cholecalciferol (VITAMIN D3) 25 MCG (1000 UNIT) tablet Take 1,000 Units by mouth 3 (three) times a week.   Yes [provider]  estradiol (ESTRACE) 0.5 MG tablet Take 1 tablet (0.5 mg total) by mouth daily. 06/03/22  Yes Patton Salles, MD  hydrochlorothiazide (HYDRODIURIL) 12.5 MG tablet Take 1 tablet (12.5 mg total) by mouth every morning. 07/15/22  Yes   losartan (COZAAR) 25 MG tablet Take 1 tablet (25 mg total) by mouth daily. 02/01/23  Yes   metroNIDAZOLE (METROCREAM) 0.75 % cream Apply 1 application topically to affected area 2 (two) times daily. 07/14/22  Yes   Multiple Vitamins-Minerals (MULTIVITAMINS THER. W/MINERALS) TABS Take 1 tablet by mouth daily.   Yes [provider]  rosuvastatin (CRESTOR) 5 MG tablet Take 1 tablet (5 mg total) by mouth daily. 10/13/21  Yes Lyn Records, MD  vitamin B-12 (CYANOCOBALAMIN) 100 MCG tablet Take 100 mcg by mouth 3 (three) times a  week.   Yes [provider]  Wheat Dextrin (BENEFIBER PO) Take by mouth. Takes two teas spoon daily   Yes [provider]  Magnesium Hydroxide (DULCOLAX PO) Take 1 tablet by mouth as directed.    [provider]  ondansetron (ZOFRAN-ODT) 4 MG disintegrating tablet Dissolve 1 tablet (4 mg total) by mouth every 8 (eight) hours as needed for nausea or vomiting. Patient not taking: Reported on 05/25/2023 05/24/23   Napoleon Form, MD  senna (SENOKOT) 8.6 MG tablet Take 1 tablet by mouth as needed for constipation.    [provider]    Current Outpatient Medications  Medication Sig Dispense Refill   Calcium Carb-Cholecalciferol (CALCIUM 500+D3 PO) Take 1 tablet by mouth daily.     cholecalciferol (VITAMIN D3) 25 MCG (1000 UNIT) tablet Take 1,000 Units by mouth 3 (three) times a week.     estradiol (ESTRACE) 0.5 MG tablet Take 1 tablet (0.5 mg total) by mouth daily. 90 tablet 3   hydrochlorothiazide (HYDRODIURIL) 12.5 MG tablet Take 1 tablet (12.5 mg total) by mouth every morning. 90 tablet 3   losartan (COZAAR) 25 MG tablet Take 1 tablet (25 mg total) by mouth daily. 100 tablet 3   metroNIDAZOLE (METROCREAM) 0.75 % cream Apply 1 application topically to affected area 2 (two) times daily. 45 g 3   Multiple Vitamins-Minerals (MULTIVITAMINS THER. W/MINERALS) TABS Take 1 tablet by mouth daily.     rosuvastatin (CRESTOR) 5 MG tablet Take 1 tablet (5 mg total) by mouth daily. 90 tablet 2   vitamin B-12 (CYANOCOBALAMIN) 100 MCG tablet Take 100 mcg by mouth 3 (three) times a week.     Wheat Dextrin (BENEFIBER PO) Take by mouth. Takes two teas spoon daily     Magnesium Hydroxide (DULCOLAX PO) Take 1 tablet by mouth as directed.     ondansetron (ZOFRAN-ODT) 4 MG disintegrating tablet Dissolve 1 tablet (4 mg total) by mouth every 8 (eight) hours as needed for nausea or vomiting. (Patient not taking: Reported on 05/25/2023) 20 tablet 0   senna (SENOKOT) 8.6 MG tablet Take 1 tablet by mouth as needed for constipation.     Current Facility-Administered Medications  Medication Dose Route Frequency Provider Last Rate Last Admin   0.9 %  sodium chloride infusion  500 mL Intravenous Once Napoleon Form, MD        Allergies as of 05/25/2023 - Review Complete 05/25/2023  Allergen Reaction Noted   Codeine Itching 02/02/2011   Ultram [tramadol] Itching 12/27/2012    Family History  Problem Relation Age of Onset   Hypertension Mother    Heart disease Mother    Cancer Mother        lung   Hypertension Father     Heart attack Father    Colon cancer Neg Hx    Esophageal cancer Neg Hx    Pancreatic cancer Neg Hx    Prostate cancer Neg Hx    Rectal cancer Neg Hx    Stomach cancer Neg Hx    Breast cancer Neg Hx     Social History   Socioeconomic History   Marital status: Divorced    Spouse name: Not on file   Number of children: 2   Years of education: Not on file   Highest education level: Not on file  Occupational History   Occupation: Teacher, adult education: [employer]  Tobacco Use   Smoking status: Never   Smokeless tobacco: Never  Vaping Use  Vaping status: Never Used  Substance and Sexual Activity   Alcohol use: Yes    Alcohol/week: 10.0 - 12.0 standard drinks of alcohol    Types: 10 - 12 Glasses of wine per week    Comment: 10-12 glasses of wine a week   Drug use: No   Sexual activity: Not Currently    Partners: Male    Birth control/protection: Post-menopausal, Surgical    Comment: R-TLH  Other Topics Concern   Not on file  Social History Narrative   Not on file   Social Drivers of Health   Financial Resource Strain: Not on file  Food Insecurity: Not on file  Transportation Needs: Not on file  Physical Activity: Not on file  Stress: Not on file  Social Connections: Not on file  Intimate Partner Violence: Not on file    Review of Systems:  All other review of systems negative except as mentioned in the HPI.  Physical Exam: Vital signs in last 24 hours: BP 134/81   Pulse 87   Temp (!) 97.5 F (36.4 C) (Temporal)   Ht 5\' 6"  (1.676 m)   Wt 139 lb (63 kg)   LMP 02/02/2011   SpO2 99%   BMI 22.44 kg/m  General:   Alert, NAD Lungs:  Clear .   Heart:  Regular rate and rhythm Abdomen:  Soft, nontender and nondistended. Neuro/Psych:  Alert and cooperative. Normal mood and affect. A and O x 3  Reviewed labs, radiology imaging, old records and pertinent past GI work up  Patient is appropriate for planned procedure(s) and anesthesia in an ambulatory setting   K.  Scherry Ran , MD [phone]

## 2023-05-25 NOTE — Patient Instructions (Addendum)

## 2023-05-25 NOTE — Op Note (Addendum)
 [facility] Patient Name: Debra Nguyen Procedure Date: 05/25/2023 12:10 PM MRN: [id number] Endoscopist: Napoleon Form , MD, [id number] Age: 68 Referring MD:  Date of Birth: Apr 18, 1955 Gender: Female Account #: [id number] Procedure:                Colonoscopy Indications:              Change in bowel habits.Generalized abdominal pain,                            Suspected rectal cancer, Metastatic cancer to                            abdominal lymph nodes, Abnormal CT of the GI tract, Medicines:                Monitored Anesthesia Care Procedure:                Pre-Anesthesia Assessment:                           - Prior to the procedure, a History and Physical                            was performed, and patient medications and                            allergies were reviewed. The patient's tolerance of                            previous anesthesia was also reviewed. The risks                            and benefits of the procedure and the sedation                            options and risks were discussed with the patient.                            All questions were answered, and informed consent                            was obtained. Prior Anticoagulants: The patient has                            taken no anticoagulant or antiplatelet agents. ASA                            Grade Assessment: III - A patient with severe                            systemic disease. After reviewing the risks and                            benefits, the patient was deemed in satisfactory  condition to undergo the procedure.                           After obtaining informed consent, the colonoscope                            was passed under direct vision. Throughout the                            procedure, the patient's blood pressure, pulse, and                            oxygen saturations were monitored continuously. The                             Olympus Scope SN: [id number] was introduced through                            the anus and advanced to the the sigmoid colon to                            examine a mass. This was the intended extent. The                            colonoscopy was performed without difficulty. The                            patient tolerated the procedure well. The quality                            of the bowel preparation was good. The rectum was                            photographed. Scope In: 12:18:30 PM Scope Out: 12:29:47 PM Total Procedure Duration: 0 hours 11 minutes 17 seconds  Findings:                 The perianal and digital rectal examinations were                            normal.                           An infiltrative partially obstructing large mass                            was found in the recto-sigmoid colon from 20 to                            28cm from anal verge. The mass was circumferential.                            The mass measured eight cm in length. Oozing was  present. This was biopsied with a cold forceps for                            histology. Distal rectal fold area was tattooed                            with an injection of 2 mL of Spot (carbon black).                            Unable to extend scope beyond the mass in the                            rectosigmoid colon, was restrictive Complications:            No immediate complications. Estimated Blood Loss:     Estimated blood loss was minimal. Impression:               - Likely malignant partially obstructing tumor in                            the recto-sigmoid colon. Biopsied. Tattooed. Recommendation:           - Mechanical soft diet.                           - Continue present medications.                           - Await pathology results.                           - Repeat colonoscopy date to be determined after                            pending pathology results are  reviewed for                            surveillance based on pathology results.                           - Follow up with Oncology as scheduled Napoleon Form, MD 05/25/2023 12:38:59 PM This report has been signed electronically.

## 2023-05-26 ENCOUNTER — Other Ambulatory Visit: Payer: Self-pay | Admitting: Gastroenterology

## 2023-05-26 ENCOUNTER — Telehealth: Payer: Self-pay

## 2023-05-26 ENCOUNTER — Other Ambulatory Visit (HOSPITAL_BASED_OUTPATIENT_CLINIC_OR_DEPARTMENT_OTHER): Payer: Self-pay

## 2023-05-26 DIAGNOSIS — K6289 Other specified diseases of anus and rectum: Secondary | ICD-10-CM

## 2023-05-26 MED ORDER — ALPRAZOLAM 0.25 MG PO TABS
0.2500 mg | ORAL_TABLET | Freq: Every evening | ORAL | 0 refills | Status: DC | PRN
  Filled 2023-05-26: qty 29, 29d supply, fill #0
  Filled 2023-05-26: qty 1, 1d supply, fill #0

## 2023-05-26 NOTE — Progress Notes (Signed)
 Recent abnormal CT concerning for metastatic malignancy, she is feeling anxious and is not able to sleep, requested medication to help cope with anxiety. Sent Rx for Xanax 0.25mg  at bedtime as needed X 30

## 2023-05-26 NOTE — Telephone Encounter (Signed)
  Follow up Call-     05/25/2023   11:26 AM  Call back number  Post procedure Call Back phone  # [phone]  Permission to leave phone message Yes     Patient questions:  Do you have a fever, pain , or abdominal swelling? No. Pain Score  0 *  Have you tolerated food without any problems? Yes.    Have you been able to return to your normal activities? Yes.    Do you have any questions about your discharge instructions: Diet   No. Medications  No. Follow up visit  No.  Do you have questions or concerns about your Care? No.  Actions: * If pain score is 4 or above: No action needed, pain <4.

## 2023-05-27 ENCOUNTER — Other Ambulatory Visit (HOSPITAL_BASED_OUTPATIENT_CLINIC_OR_DEPARTMENT_OTHER): Payer: Self-pay

## 2023-05-27 ENCOUNTER — Inpatient Hospital Stay

## 2023-05-27 ENCOUNTER — Inpatient Hospital Stay: Attending: Nurse Practitioner | Admitting: Nurse Practitioner

## 2023-05-27 VITALS — BP 142/86 | HR 100 | Temp 98.2°F | Resp 18 | Ht 66.0 in | Wt 136.3 lb

## 2023-05-27 DIAGNOSIS — M1990 Unspecified osteoarthritis, unspecified site: Secondary | ICD-10-CM | POA: Insufficient documentation

## 2023-05-27 DIAGNOSIS — K219 Gastro-esophageal reflux disease without esophagitis: Secondary | ICD-10-CM | POA: Insufficient documentation

## 2023-05-27 DIAGNOSIS — Z5189 Encounter for other specified aftercare: Secondary | ICD-10-CM | POA: Insufficient documentation

## 2023-05-27 DIAGNOSIS — F419 Anxiety disorder, unspecified: Secondary | ICD-10-CM | POA: Insufficient documentation

## 2023-05-27 DIAGNOSIS — K6289 Other specified diseases of anus and rectum: Secondary | ICD-10-CM | POA: Diagnosis not present

## 2023-05-27 DIAGNOSIS — Z79899 Other long term (current) drug therapy: Secondary | ICD-10-CM | POA: Diagnosis not present

## 2023-05-27 DIAGNOSIS — N189 Chronic kidney disease, unspecified: Secondary | ICD-10-CM | POA: Diagnosis not present

## 2023-05-27 DIAGNOSIS — R971 Elevated cancer antigen 125 [CA 125]: Secondary | ICD-10-CM | POA: Diagnosis not present

## 2023-05-27 DIAGNOSIS — C569 Malignant neoplasm of unspecified ovary: Secondary | ICD-10-CM | POA: Diagnosis not present

## 2023-05-27 DIAGNOSIS — N891 Moderate vaginal dysplasia: Secondary | ICD-10-CM | POA: Insufficient documentation

## 2023-05-27 DIAGNOSIS — Z90710 Acquired absence of both cervix and uterus: Secondary | ICD-10-CM | POA: Insufficient documentation

## 2023-05-27 DIAGNOSIS — C785 Secondary malignant neoplasm of large intestine and rectum: Secondary | ICD-10-CM | POA: Diagnosis not present

## 2023-05-27 DIAGNOSIS — C786 Secondary malignant neoplasm of retroperitoneum and peritoneum: Secondary | ICD-10-CM | POA: Insufficient documentation

## 2023-05-27 DIAGNOSIS — Z5111 Encounter for antineoplastic chemotherapy: Secondary | ICD-10-CM | POA: Insufficient documentation

## 2023-05-27 DIAGNOSIS — C772 Secondary and unspecified malignant neoplasm of intra-abdominal lymph nodes: Secondary | ICD-10-CM | POA: Insufficient documentation

## 2023-05-27 DIAGNOSIS — I129 Hypertensive chronic kidney disease with stage 1 through stage 4 chronic kidney disease, or unspecified chronic kidney disease: Secondary | ICD-10-CM | POA: Insufficient documentation

## 2023-05-27 DIAGNOSIS — E559 Vitamin D deficiency, unspecified: Secondary | ICD-10-CM | POA: Diagnosis not present

## 2023-05-27 DIAGNOSIS — R590 Localized enlarged lymph nodes: Secondary | ICD-10-CM | POA: Insufficient documentation

## 2023-05-27 DIAGNOSIS — C579 Malignant neoplasm of female genital organ, unspecified: Secondary | ICD-10-CM | POA: Diagnosis not present

## 2023-05-27 LAB — CMP (CANCER CENTER ONLY)
ALT: 13 U/L (ref 0–44)
AST: 19 U/L (ref 15–41)
Albumin: 4.1 g/dL (ref 3.5–5.0)
Alkaline Phosphatase: 86 U/L (ref 38–126)
Anion gap: 11 (ref 5–15)
BUN: 8 mg/dL (ref 8–23)
CO2: 26 mmol/L (ref 22–32)
Calcium: 9.7 mg/dL (ref 8.9–10.3)
Chloride: 101 mmol/L (ref 98–111)
Creatinine: 0.65 mg/dL (ref 0.44–1.00)
GFR, Estimated: >60 mL/min (ref >60)
Glucose, Bld: 101 mg/dL — ABNORMAL HIGH (ref 70–99)
Potassium: 3.4 mmol/L — ABNORMAL LOW (ref 3.5–5.1)
Sodium: 138 mmol/L (ref 135–145)
Total Bilirubin: 0.5 mg/dL (ref 0.0–1.2)
Total Protein: 8.3 g/dL — ABNORMAL HIGH (ref 6.5–8.1)

## 2023-05-27 LAB — CBC WITH DIFFERENTIAL (CANCER CENTER ONLY)
Abs Immature Granulocytes: 0.02 10*3/uL (ref 0.00–0.07)
Basophils Absolute: 0.1 10*3/uL (ref 0.0–0.1)
Basophils Relative: 1 %
Eosinophils Absolute: 0 10*3/uL (ref 0.0–0.5)
Eosinophils Relative: 0 %
HCT: 36.2 % (ref 36.0–46.0)
Hemoglobin: 12.1 g/dL (ref 12.0–15.0)
Immature Granulocytes: 0 %
Lymphocytes Relative: 16 %
Lymphs Abs: 1.3 10*3/uL (ref 0.7–4.0)
MCH: 29.7 pg (ref 26.0–34.0)
MCHC: 33.4 g/dL (ref 30.0–36.0)
MCV: 88.9 fL (ref 80.0–100.0)
Monocytes Absolute: 0.5 10*3/uL (ref 0.1–1.0)
Monocytes Relative: 7 %
Neutro Abs: 6.1 10*3/uL (ref 1.7–7.7)
Neutrophils Relative %: 76 %
Platelet Count: 429 10*3/uL — ABNORMAL HIGH (ref 150–400)
RBC: 4.07 MIL/uL (ref 3.87–5.11)
RDW: 13 % (ref 11.5–15.5)
WBC Count: 8 10*3/uL (ref 4.0–10.5)
nRBC: 0 % (ref 0.0–0.2)

## 2023-05-27 LAB — SURGICAL PATHOLOGY

## 2023-05-27 LAB — CEA (ACCESS): CEA (CHCC): 5.86 ng/mL — ABNORMAL HIGH (ref 0.00–5.00)

## 2023-05-27 MED ORDER — DEXAMETHASONE 2 MG PO TABS
10.0000 mg | ORAL_TABLET | Freq: Every day | ORAL | 0 refills | Status: AC
  Filled 2023-05-27: qty 10, 2d supply, fill #0

## 2023-05-27 MED ORDER — PROCHLORPERAZINE MALEATE 10 MG PO TABS
10.0000 mg | ORAL_TABLET | Freq: Four times a day (QID) | ORAL | 3 refills | Status: DC | PRN
  Filled 2023-05-27: qty 30, 8d supply, fill #0

## 2023-05-27 NOTE — Progress Notes (Signed)
 Given patient material on Taxol/ Carboplatin, scheduled Chemo Education on 05/31/23 at 8 am, scheduled PAC placement on 05/31/23 at 2:15 pm, [facility] [place] location. Will schedule CT chest w contrast

## 2023-05-27 NOTE — Progress Notes (Signed)
 New Hematology/Oncology Consult   Requesting MD: Dr. Marsa Aris  [phone]      Reason for Consult: Rectal mass, abdominal masses  HPI: Debra Nguyen is a 68 year old woman referred after she was found to have a rectal mass and abdominal masses.  CT abdomen/pelvis 05/13/2023 showed a 5.0 cm apple core lesion in the upper/mid rectum.  Peritoneal disease/omental caking with multiple cystic metastases noted as well as abdominopelvic lymphadenopathy.  She underwent a colonoscopy on 05/25/2023 and was found to have an infiltrative partially obstructing large mass in the rectosigmoid colon from 20 to 28 cm from the anal verge.  The mass was circumferential.  Oozing was present.  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, immunohistochemical staining favors high-grade serous carcinoma     Past Medical History:  Diagnosis Date   Allergy    Anxiety    Through divorce   Arthritis    ASCUS with positive high risk HPV 12/2006   Negative Colpo BX   ASCUS with positive high risk HPV 2009   Chronic kidney disease    kidney stones   CIN III (cervical intraepithelial neoplasia III) 07/1983   tx'd w/cryo- no subsequent abnormal paps until 2008   Essential hypertension    Fibroid 2006   3 cm Right Fundal Fibroid   GERD (gastroesophageal reflux disease) 2011   History of kidney stones    Hypertension    Osteopenia 2020   Synovial cyst of lumbar spine 10/2014   Vitamin D deficiency      Past Surgical History:  Procedure Laterality Date   ABDOMINAL HYSTERECTOMY  02/23/2011   R TLH-fibroids, adenomyosis 221 g, & focal CIN I w/clear margins   ANTERIOR CERVICAL DECOMP/DISCECTOMY FUSION N/A 07/19/2012   Procedure: ANTERIOR CERVICAL DECOMPRESSION/DISCECTOMY FUSION 2 LEVELS;  Surgeon: Maeola Harman, MD;  Location: [facility];  Service: Neurosurgery;  Laterality: N/A;  Anterior Cervical Five-Six Cervical Six-Seven Anterior Cervical  Decompression/Diskectomy/Fusion   cataracts Bilateral 03/2022   COLONOSCOPY     CYSTOSCOPY  02/23/2011   PT.STATES SHE DID NOT HAVE   dental implants     LASIK  '99   LUMBAR LAMINECTOMY/DECOMPRESSION MICRODISCECTOMY Right 02/05/2015   Procedure: Right Lumbar four-five Laminectomy with Resection of Synovial Cyst ;  Surgeon: Maeola Harman, MD;  Location: [facility];  Service: Neurosurgery;  Laterality: Right;   MOHS SURGERY     x 2   MOLE REMOVAL  2023   REPLACEMENT TOTAL KNEE Right 09/2021   TONSILLECTOMY AND ADENOIDECTOMY     VAGINAL DELIVERY  '92 '94     Current Outpatient Medications:    ALPRAZolam (XANAX) 0.25 MG tablet, Take 1 tablet (0.25 mg total) by mouth at bedtime as needed for anxiety., Disp: 30 tablet, Rfl: 0   estradiol (ESTRACE) 0.5 MG tablet, Take 1 tablet (0.5 mg total) by mouth daily., Disp: 90 tablet, Rfl: 3   hydrochlorothiazide (HYDRODIURIL) 12.5 MG tablet, Take 1 tablet (12.5 mg total) by mouth every morning., Disp: 90 tablet, Rfl: 3   losartan (COZAAR) 25 MG tablet, Take 1 tablet (25 mg total) by mouth daily., Disp: 100 tablet, Rfl: 3   polyethylene glycol (MIRALAX / GLYCOLAX) 17 g packet, Take 17 g by mouth daily., Disp: , Rfl:    rosuvastatin (CRESTOR) 5 MG tablet, Take 1 tablet (5 mg total) by mouth daily., Disp: 90 tablet, Rfl: 2   Wheat Dextrin (BENEFIBER PO), Take by mouth. Takes two teas spoon daily, Disp: , Rfl:    Calcium  Carb-Cholecalciferol (CALCIUM 500+D3 PO), Take 1 tablet by mouth daily. (Patient not taking: Reported on 05/27/2023), Disp: , Rfl:    cholecalciferol (VITAMIN D3) 25 MCG (1000 UNIT) tablet, Take 1,000 Units by mouth 3 (three) times a week. (Patient not taking: Reported on 05/27/2023), Disp: , Rfl:    Magnesium Hydroxide (DULCOLAX PO), Take 1 tablet by mouth as directed. (Patient not taking: Reported on 05/27/2023), Disp: , Rfl:    metroNIDAZOLE (METROCREAM) 0.75 % cream, Apply 1 application topically to affected area 2 (two) times daily.  (Patient not taking: Reported on 05/27/2023), Disp: 45 g, Rfl: 3   Multiple Vitamins-Minerals (MULTIVITAMINS THER. W/MINERALS) TABS, Take 1 tablet by mouth daily. (Patient not taking: Reported on 05/27/2023), Disp: , Rfl:    ondansetron (ZOFRAN-ODT) 4 MG disintegrating tablet, Dissolve 1 tablet (4 mg total) by mouth every 8 (eight) hours as needed for nausea or vomiting. (Patient not taking: Reported on 05/27/2023), Disp: 20 tablet, Rfl: 0   senna (SENOKOT) 8.6 MG tablet, Take 1 tablet by mouth as needed for constipation. (Patient not taking: Reported on 05/27/2023), Disp: , Rfl:    vitamin B-12 (CYANOCOBALAMIN) 100 MCG tablet, Take 100 mcg by mouth 3 (three) times a week. (Patient not taking: Reported on 05/27/2023), Disp: , Rfl: :     Allergies  Allergen Reactions   Codeine Itching   Ultram [Tramadol] Itching    FH: Mother may have had lung cancer in her 75s, maternal uncle colon cancer, maternal cousin colon cancer.  SOCIAL HISTORY: She lives in [place].  She is divorced.  She has 2 sons.  She is a retired Engineer, civil (consulting).  No tobacco use.  She drinks wine on occasion.  Review of Systems: No fevers or sweats.  Appetite was good until recently.  No weight loss.  No dysphagia.  No shortness of breath.  Since the colonoscopy she has been having clear to light tan mucus from the rectum.  She has not seen definite stool.  She is afraid to eat due to concern she will become obstructed.  No pain with bowel movements.  No rectal bleeding.  No nausea or vomiting.  She is taking MiraLAX twice a day.  Physical Exam:  Blood pressure (!) 142/86, pulse 100, temperature 98.2 F (36.8 C), temperature source Temporal, resp. rate 18, height 5\' 6"  (1.676 m), weight 136 lb 4.8 oz (61.8 kg), last menstrual period 02/02/2011, SpO2 98%.  Lungs: Lungs clear bilateral Cardiac: Regular rate and rhythm. Abdomen: No hepatomegaly.  Palpable left side abdominal masses. Vascular: No leg edema. Lymph nodes: No palpable  cervical, supraclavicular, axillary or inguinal lymph nodes. Neurologic: Alert and oriented. Skin: No rash.  LABS:  No results for input(s): "WBC", "HGB", "HCT", "PLT" in the last 72 hours.  No results for input(s): "NA", "K", "CL", "CO2", "GLUCOSE", "BUN", "CREATININE", "CALCIUM" in the last 72 hours.    RADIOLOGY:  CT ABDOMEN PELVIS W CONTRAST Result Date: 05/22/2023 CLINICAL DATA:  Left abdominal pain EXAM: CT ABDOMEN AND PELVIS WITH CONTRAST TECHNIQUE: Multidetector CT imaging of the abdomen and pelvis was performed using the standard protocol following bolus administration of intravenous contrast. RADIATION DOSE REDUCTION: This exam was performed according to the departmental dose-optimization program which includes automated exposure control, adjustment of the mA and/or kV according to patient size and/or use of iterative reconstruction technique. CONTRAST:  OMNIPAQUE IOHEXOL 300 MG/ML  SOLN COMPARISON:  07/30/2016 FINDINGS: Lower chest: Lung bases are clear. Hepatobiliary: Liver is within normal limits. Gallbladder is unremarkable. No intrahepatic or  extrahepatic ductal dilatation. Pancreas: Within normal limits. Spleen: Within normal limits. Adrenals/Urinary Tract: Adrenal glands are within normal limits. Right kidney is within normal limits. Subcentimeter left upper pole renal cyst (series 2/image 17), benign (Bosniak I). No follow-up is recommended. No hydronephrosis. Bladder is underdistended but unremarkable. Stomach/Bowel: Stomach is within normal limits. No evidence of bowel obstruction. Appendix is not discretely visualized. 5.0 cm apple core lesion in the upper/mid rectum (series 2/image 86), highly suspicious for rectal adenocarcinoma. Vascular/Lymphatic: No evidence of abdominal aortic aneurysm. Atherosclerotic calcifications of the abdominal aorta and branch vessels. Abdominopelvic lymphadenopathy, including: --19 mm short axis node in portacaval region (series 2/image 23) --16  mm short axis right para-aortic node (series 2/image 29) --11 mm short axis right obturator node (series 2/image 59) Reproductive: Status post hysterectomy. No adnexal masses. Other: No abdominopelvic ascites. Peritoneal disease/omental caking. Multiple cystic metastases in the abdomen/pelvis, including: --7.7 cm mass in the left upper abdomen (series 2/image 32) --Adjacent 4.0 cm mass beneath the left anterior abdominal wall (series 2/image 37) --14.4 cm bilobed cystic mass in the left mid abdomen (series 2/image 48) --7.6 cm mass in the right deep pelvis (series 2/image 65) Musculoskeletal: Visualized osseous structures are within normal limits. IMPRESSION: 5.0 cm apples core lesion in the upper/mid rectum, highly suspicious for rectal adenocarcinoma. Colonoscopy is suggested. Peritoneal disease/omental caking with multiple cystic metastases, as above. Abdominopelvic nodal metastases, as above. These results will be called to the ordering clinician or representative by the Radiologist Assistant, and communication documented in the PACS or [employer]. Electronically Signed   By: Charline Bills M.D.   On: 05/22/2023 02:18    Assessment and Plan:   Gynecologic malignancy, unknown primary site CT abdomen/pelvis 05/13/2023-5.0 cm apple core lesion in the upper/mid rectum.  Peritoneal disease/omental caking with multiple cystic metastases noted as well as abdominopelvic lymphadenopathy.  Colonoscopy 05/25/2023-infiltrative partially obstructing large mass in the rectosigmoid colon from 20 to 28 cm from the anal verge.  The mass was circumferential.  Oozing was present.  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  Primary site is unknown.  We reviewed the diagnosis, prognosis and treatment options with her and her friend at today's visit.  We have been in contact with Dr. Dorothyann Gibbs NP, GYN oncology.  The plan is to begin systemic therapy with Taxol/carboplatin.  We reviewed potential side effects including bone marrow toxicity, nausea, hair loss, mouth sores, diarrhea or constipation, allergic reaction.  We discussed the peripheral neuropathy, arthralgias/myalgias, hypersensitivity reaction associated with Taxol.  She understands the rationale for dexamethasone premedication.  We reviewed the recommendation for white cell growth factor support and discussed potential side effects including bone pain, rash, splenic rupture.  She agrees to proceed.  She will attend a chemotherapy education class.  She is scheduled for Port-A-Cath placement 05/31/2023.  Baseline labs obtained today.  Anticipate start date cycle 1 Taxol/carboplatin 06/02/2023.  We referred her for a staging chest CT.    Prescription sent to her pharmacy for Compazine.  She will return for a nadir CBC 06/11/2023.  We will see her in follow-up prior to cycle 2 Taxol/carboplatin on 06/23/2023.  We are available to see her sooner if needed.  Patient seen with Dr. Truett Perna.  CT report/images reviewed with Debra Nguyen at today's visit.    Lonna Cobb, NP 05/27/2023, 2:59 PM  This was a shared visit with Lonna Cobb.  Debra Nguyen was interviewed and examined.  We reviewed the 05/13/2023 CT abdomen/pelvis images with her.  We reviewed the results from the colonoscopic biopsy.  The tumor histology and immunohistochemical profile are consistent with metastatic disease from a gynecologic primary.  Likely primary tumor sites include the ovaries, fallopian tubes, and peritoneum.  We consulted with Dr. Pricilla Holm.  She will see Debra Nguyen within the next few weeks.  Dr. Pricilla Holm recommends proceeding with chemotherapy.   I recommend paclitaxel/carboplatin.  Tumor tissue will be submitted for NGS testing.  Germline testing will also be performed.  We reviewed potential toxicities associated with the paclitaxel/carboplatin regimen.  We also discussed toxicities associated with G-CSF.  She agrees to proceed.  She will attend a chemotherapy teaching class.  She will return to the lab for a CEA and CA125 today.  We discussed the goals of chemotherapy are to decrease the metastatic tumor burden, prolong survival, and increase the probability of cytoreductive surgery.  Debra Nguyen will be referred for Port-A-Cath placement and a chemotherapy teaching class.  The plan is to begin chemotherapy on 06/02/2023.  A treatment plan was entered today.  I was present for greater than 50% of today's visit.  I performed medical decision making.  Mancel Bale, MD

## 2023-05-27 NOTE — Progress Notes (Signed)
 Sent email to [facility] Pathology and it was forwarded to [facility] to send tissue for [facility] Testing on case [phone]

## 2023-05-27 NOTE — Progress Notes (Signed)
 START ON PATHWAY REGIMEN - Ovarian     A cycle is every 21 days:     Paclitaxel      Carboplatin   **Always confirm dose/schedule in your pharmacy ordering system**  Patient Characteristics: Preoperative or Nonsurgical Candidate (Clinical Staging), Newly Diagnosed, Neoadjuvant Therapy followed by Surgery BRCA Mutation Status: Awaiting Test Results Therapeutic Status: Preoperative or Nonsurgical Candidate (Clinical Staging) AJCC T Category: cTX AJCC 8 Stage Grouping: IV AJCC N Category: cN1 AJCC M Category: pM1 Therapy Plan: Neoadjuvant Therapy followed by Surgery Intent of Therapy: Curative Intent, Discussed with Patient

## 2023-05-28 ENCOUNTER — Encounter: Payer: Self-pay | Admitting: Nurse Practitioner

## 2023-05-28 ENCOUNTER — Encounter: Payer: Self-pay | Admitting: Oncology

## 2023-05-28 ENCOUNTER — Telehealth: Payer: Self-pay | Admitting: Gastroenterology

## 2023-05-28 ENCOUNTER — Telehealth: Payer: Self-pay

## 2023-05-28 ENCOUNTER — Ambulatory Visit (HOSPITAL_COMMUNITY)
Admission: RE | Admit: 2023-05-28 | Discharge: 2023-05-28 | Disposition: A | Discharge status: Home / Self Care | Source: Ambulatory Visit | Attending: Nurse Practitioner | Admitting: Nurse Practitioner

## 2023-05-28 ENCOUNTER — Other Ambulatory Visit: Payer: Self-pay

## 2023-05-28 DIAGNOSIS — R918 Other nonspecific abnormal finding of lung field: Secondary | ICD-10-CM | POA: Diagnosis not present

## 2023-05-28 DIAGNOSIS — R591 Generalized enlarged lymph nodes: Secondary | ICD-10-CM | POA: Insufficient documentation

## 2023-05-28 DIAGNOSIS — C579 Malignant neoplasm of female genital organ, unspecified: Secondary | ICD-10-CM | POA: Diagnosis not present

## 2023-05-28 DIAGNOSIS — K6289 Other specified diseases of anus and rectum: Secondary | ICD-10-CM | POA: Diagnosis not present

## 2023-05-28 DIAGNOSIS — I700 Atherosclerosis of aorta: Secondary | ICD-10-CM | POA: Insufficient documentation

## 2023-05-28 LAB — CA 125: Cancer Antigen (CA) 125: 2656 U/mL — ABNORMAL HIGH (ref 0.0–38.1)

## 2023-05-28 MED ORDER — IOHEXOL 300 MG/ML  SOLN
75.0000 mL | Freq: Once | INTRAMUSCULAR | Status: AC | PRN
  Administered 2023-05-28: 75 mL via INTRAVENOUS

## 2023-05-28 MED ORDER — SODIUM CHLORIDE (PF) 0.9 % IJ SOLN
INTRAMUSCULAR | Status: AC
  Filled 2023-05-28: qty 50

## 2023-05-28 NOTE — Telephone Encounter (Signed)
 Spoke with Debra Nguyen regarding her referral to GYN oncology. She has an appointment scheduled with Dr. Pricilla Holm on 06/11/23 at 10:30. Patient agrees to date and time. She has been provided with office address and location. She is also aware of our mask and visitor policy. Patient verbalized understanding and will call with any questions.

## 2023-05-28 NOTE — Telephone Encounter (Signed)
 Inbound call from patient requesting to speak with Dr. Lavon Paganini regarding questions she has about her new cancer diagnoses. Please advise.

## 2023-05-28 NOTE — Progress Notes (Signed)
 Reminder call to Debra Nguyen regarding Port placement on 05/31/23. Instructions provided on NPO status, what to expect pre, during, and post procedure, advised to wear comfortable clothing, reminded her that she will need a driver, reviewed allergies and medications she was advised not to take any pain medicine, muscle relaxer, anti-anxiety medication, and arrival time of 1010. Time allowed to ask questions, Debra Nguyen verbalized understanding.

## 2023-05-28 NOTE — Discharge Instructions (Signed)
 Implanted Port Insertion After Care   What can I expect after the procedure?   After the procedure, it is common to have:   Discomfort at the port insertion site.   Bruising on the skin over the port. This should improve over 3-4 days.    Port care   After your port is placed, you will get a manufacturer's information card. The card has information about your port. Keep this card with you at all times.   Take care of the port as told by your health care provider. Ask your health care provider if you or a family member can get training for taking care of the port at home.   Make sure to remember what type of port you have.       Incision care   Follow instructions from your health care provider about how to take care of your port insertion site. Make sure you:   Wash your hands with soap and water for at least 20 seconds before and after you change your bandage (dressing). If soap and water are not available, use hand sanitizer.        Leave your initial bandage on for a full 24 hours.     After 24 hours you may remove the dressing and shower.  Do not scrub directly on the incision site but rather above it and let the soapy water run over the incision.  Pat dry after.    You may then opt to redress your incision for your comfort but you may just leave it open to air.  The Dermabond (surgical super glue) will protect your incision and keep it clean and dry.    Leave the layer of skin glue in place. If the glue edges start to loosen and curl up, you may trim the loose edges but do not pull or pick at it.   Do NOT apply neosporin or other antibacterial ointment to the surgical glue.  It will dissolve the glue and expose your new incision to possible infection.     Do NOT apply EMLA numbing cream to the surgical glue.  It will dissolve the glue and expose your new incision to possible infection.  You may have to wait to use the EMLA cream until your incision has healed.    Check  your port insertion site every day for signs of infection. Check for:   Redness, swelling, or pain.   Fluid or blood.   Warmth.   Pus or a bad smell.      Activity   Return to your normal activities as told by your health care provider. Ask your health care provider what activities are safe for you.   You may have to avoid lifting. Ask your health care provider how much you can safely lift.   General instructions   Take over-the-counter and prescription medicines only as told by your health care provider.   Do not take baths, swim, or use a hot tub until your incision has healed completely (usually 2 weeks).    If you were given a sedative during the procedure, it can affect you for several hours. Do not drive, operate machinery or sign important documents for 24 hours after your procedure.   Keep all follow-up visits. This is important.   Please contact our office at [phone] for the following symptoms:   You have a fever or chills.   You have redness, swelling, or pain around your port insertion site.  You have fluid or blood coming from your port insertion site.   Your port insertion site feels warm to the touch.   You have pus or a bad smell coming from the port insertion site.   Get help right away if:   You have chest pain or shortness of breath.   You have bleeding from your port that you cannot control.   Do not wait to see if the symptoms will go away.   Do not drive yourself to the hospital.      These symptoms may be an emergency.    Get help right away. Call 911.     Summary   Take care of the port as told by your health care provider. Keep the manufacturer's information card with you at all times.   Keep your dressing on for 24 hours!  After that you can shower and redress the site only as needed.    No neosporin, antibiotic ointment or EMLA numbing cream on the glue over your incision   Do not submerge your incision under water in a  bath, pool or hot tube until fully healed   Contact a health care provider if you have a fever or chills or if you have redness, swelling, or pain around your port insertion site.   Keep all follow-up visits.   If you need to speak to someone after hours (5:00PM) please contact the on-call IR MD at [phone], tell them you are a patient of Dr. Milford Cage, you had a Port placed today and any issues you are experiencing.   Thank you for visiting [facility] today!

## 2023-05-28 NOTE — Progress Notes (Signed)
 Pharmacist Chemotherapy Monitoring - Initial Assessment    Anticipated start date: 06/02/23   The following has been reviewed per standard work regarding the patient's treatment regimen: The patient's diagnosis, treatment plan and drug doses, and organ/hematologic function Lab orders and baseline tests specific to treatment regimen  The treatment plan start date, drug sequencing, and pre-medications Prior authorization status  Patient's documented medication list, including drug-drug interaction screen and prescriptions for anti-emetics and supportive care specific to the treatment regimen The drug concentrations, fluid compatibility, administration routes, and timing of the medications to be used The patient's access for treatment and lifetime cumulative dose history, if applicable  The patient's medication allergies and previous infusion related reactions, if applicable   Changes made to treatment plan:  N/A  Follow up needed:  N/A   Daylene Katayama, RPH, 05/28/2023  3:02 PM

## 2023-05-31 ENCOUNTER — Encounter: Payer: Self-pay | Admitting: Oncology

## 2023-05-31 ENCOUNTER — Ambulatory Visit
Admission: RE | Admit: 2023-05-31 | Discharge: 2023-05-31 | Disposition: A | Discharge status: Home / Self Care | Source: Ambulatory Visit | Attending: Nurse Practitioner | Admitting: Nurse Practitioner

## 2023-05-31 DIAGNOSIS — C569 Malignant neoplasm of unspecified ovary: Secondary | ICD-10-CM | POA: Diagnosis not present

## 2023-05-31 DIAGNOSIS — K6289 Other specified diseases of anus and rectum: Secondary | ICD-10-CM

## 2023-05-31 DIAGNOSIS — Z452 Encounter for adjustment and management of vascular access device: Secondary | ICD-10-CM | POA: Diagnosis not present

## 2023-05-31 HISTORY — PX: IR IMAGING GUIDED PORT INSERTION: IMG5740

## 2023-05-31 MED ORDER — MIDAZOLAM HCL 2 MG/2ML IJ SOLN
1.0000 mg | INTRAMUSCULAR | Status: DC | PRN
  Administered 2023-05-31 (×2): 1 mg via INTRAVENOUS

## 2023-05-31 MED ORDER — LIDOCAINE-EPINEPHRINE 1 %-1:100000 IJ SOLN
20.0000 mL | Freq: Once | INTRAMUSCULAR | Status: AC
  Administered 2023-05-31: 20 mL via INTRADERMAL

## 2023-05-31 MED ORDER — HEPARIN SOD (PORK) LOCK FLUSH 100 UNIT/ML IV SOLN
500.0000 [IU] | Freq: Once | INTRAVENOUS | Status: AC
  Administered 2023-05-31: 500 [IU]

## 2023-05-31 MED ORDER — FENTANYL CITRATE PF 50 MCG/ML IJ SOSY
25.0000 ug | PREFILLED_SYRINGE | INTRAMUSCULAR | Status: DC | PRN
  Administered 2023-05-31: 50 ug via INTRAVENOUS

## 2023-05-31 MED ORDER — SODIUM CHLORIDE 0.9 % IV SOLN: INTRAVENOUS | Status: DC

## 2023-05-31 NOTE — H&P (Signed)
 Chief Complaint: Patient was seen in consultation today for Ovarian cancer- Port a cath placement at the request of Rana Snare  Referring Physician(s): Rana Snare  Supervising Physician: Roanna Banning  Patient Status: DRI LB  History of Present Illness: Debra Nguyen is a 68 y.o. female   FULL Code status Ovarian cancer  To start Chemotherapy this week Scheduled today for [facility] placement  Past Medical History:  Diagnosis Date   Allergy    Anxiety    Through divorce   Arthritis    ASCUS with positive high risk HPV 12/2006   Negative Colpo BX   ASCUS with positive high risk HPV 2009   Chronic kidney disease    kidney stones   CIN III (cervical intraepithelial neoplasia III) 07/1983   tx'd w/cryo- no subsequent abnormal paps until 2008   Essential hypertension    Fibroid 2006   3 cm Right Fundal Fibroid   GERD (gastroesophageal reflux disease) 2011   History of kidney stones    Hypertension    Osteopenia 2020   Synovial cyst of lumbar spine 10/2014   Vitamin D deficiency     Past Surgical History:  Procedure Laterality Date   ABDOMINAL HYSTERECTOMY  02/23/2011   R TLH-fibroids, adenomyosis 221 g, & focal CIN I w/clear margins   ANTERIOR CERVICAL DECOMP/DISCECTOMY FUSION N/A 07/19/2012   Procedure: ANTERIOR CERVICAL DECOMPRESSION/DISCECTOMY FUSION 2 LEVELS;  Surgeon: Maeola Harman, MD;  Location: [facility];  Service: Neurosurgery;  Laterality: N/A;  Anterior Cervical Five-Six Cervical Six-Seven Anterior Cervical Decompression/Diskectomy/Fusion   cataracts Bilateral 03/2022   COLONOSCOPY     CYSTOSCOPY  02/23/2011   PT.STATES SHE DID NOT HAVE   dental implants     LASIK  '99   LUMBAR LAMINECTOMY/DECOMPRESSION MICRODISCECTOMY Right 02/05/2015   Procedure: Right Lumbar four-five Laminectomy with Resection of Synovial Cyst ;  Surgeon: Maeola Harman, MD;  Location: [facility];  Service: Neurosurgery;  Laterality: Right;   MOHS SURGERY     x 2   MOLE  REMOVAL  2023   REPLACEMENT TOTAL KNEE Right 09/2021   TONSILLECTOMY AND ADENOIDECTOMY     VAGINAL DELIVERY  '92 '94    Allergies: Codeine and Ultram [tramadol]  Medications: Prior to Admission medications   Medication Sig Start Date End Date Taking? Authorizing Provider  ALPRAZolam (XANAX) 0.25 MG tablet Take 1 tablet (0.25 mg total) by mouth at bedtime as needed for anxiety. 05/26/23   Napoleon Form, MD  Calcium Carb-Cholecalciferol (CALCIUM 500+D3 PO) Take 1 tablet by mouth daily. Patient not taking: Reported on 05/27/2023    [provider]  cholecalciferol (VITAMIN D3) 25 MCG (1000 UNIT) tablet Take 1,000 Units by mouth 3 (three) times a week. Patient not taking: Reported on 05/27/2023    [provider]  dexamethasone (DECADRON) 2 MG tablet Take 5 tablets (10 mg total) by mouth daily for 2 doses. Take 10 mg at 6pm night before first chemo and 6 am morning of first chemo 06/01/23 06/03/23  Rana Snare, NP  hydrochlorothiazide (HYDRODIURIL) 12.5 MG tablet Take 1 tablet (12.5 mg total) by mouth every morning. 07/15/22     losartan (COZAAR) 25 MG tablet Take 1 tablet (25 mg total) by mouth daily. 02/01/23     Magnesium Hydroxide (DULCOLAX PO) Take 1 tablet by mouth as directed. Patient not taking: Reported on 05/27/2023    [provider]  metroNIDAZOLE (METROCREAM) 0.75 % cream Apply 1 application topically to affected area 2 (two)  times daily. Patient not taking: Reported on 05/27/2023 07/14/22     Multiple Vitamins-Minerals (MULTIVITAMINS THER. W/MINERALS) TABS Take 1 tablet by mouth daily. Patient not taking: Reported on 05/27/2023    [provider]  ondansetron (ZOFRAN-ODT) 4 MG disintegrating tablet Dissolve 1 tablet (4 mg total) by mouth every 8 (eight) hours as needed for nausea or vomiting. Patient not taking: Reported on 05/27/2023 05/24/23   Napoleon Form, MD  polyethylene glycol (MIRALAX / GLYCOLAX) 17 g packet Take 17 g by mouth daily.     [provider]  prochlorperazine (COMPAZINE) 10 MG tablet Take 1 tablet (10 mg total) by mouth every 6 (six) hours as needed for nausea or vomiting. 05/27/23   Rana Snare, NP  rosuvastatin (CRESTOR) 5 MG tablet Take 1 tablet (5 mg total) by mouth daily. 10/13/21   Lyn Records, MD  senna (SENOKOT) 8.6 MG tablet Take 1 tablet by mouth as needed for constipation. Patient not taking: Reported on 05/27/2023    [provider]  vitamin B-12 (CYANOCOBALAMIN) 100 MCG tablet Take 100 mcg by mouth 3 (three) times a week. Patient not taking: Reported on 05/27/2023    [provider]     Family History  Problem Relation Age of Onset   Hypertension Mother    Heart disease Mother    Cancer Mother        lung   Hypertension Father    Heart attack Father    Colon cancer Neg Hx    Esophageal cancer Neg Hx    Pancreatic cancer Neg Hx    Prostate cancer Neg Hx    Rectal cancer Neg Hx    Stomach cancer Neg Hx    Breast cancer Neg Hx     Social History   Socioeconomic History   Marital status: Divorced    Spouse name: Not on file   Number of children: 2   Years of education: Not on file   Highest education level: Not on file  Occupational History   Occupation: Teacher, adult education: [place]  Tobacco Use   Smoking status: Never   Smokeless tobacco: Never  Vaping Use   Vaping status: Never Used  Substance and Sexual Activity   Alcohol use: Yes    Alcohol/week: 10.0 - 12.0 standard drinks of alcohol    Types: 10 - 12 Glasses of wine per week    Comment: 10-12 glasses of wine a week   Drug use: No   Sexual activity: Not Currently    Partners: Male    Birth control/protection: Post-menopausal, Surgical    Comment: R-TLH  Other Topics Concern   Not on file  Social History Narrative   Not on file   Social Drivers of Health   Financial Resource Strain: Not on file  Food Insecurity: No Food Insecurity (05/27/2023)   Hunger Vital Sign    Worried About  Running Out of Food in the Last Year: Never true    Ran Out of Food in the Last Year: Never true  Transportation Needs: No Transportation Needs (05/27/2023)   PRAPARE - Administrator, Civil Service (Medical): No    Lack of Transportation (Non-Medical): No  Physical Activity: Not on file  Stress: Not on file  Social Connections: Not on file    Review of Systems: A 12 point ROS discussed and pertinent positives are indicated in the HPI above.  All other systems are negative.    Vital Signs:  BP (!) 142/76 (BP Location: Right Arm, Cuff Size: Small)   Pulse 85   Temp 98.3 F (36.8 C)   LMP 02/02/2011   SpO2 97%   Advance Care Plan: The advanced care plan/surrogate decision maker was discussed at the time of visit and documented in the medical record.    Physical Exam Vitals reviewed.  Cardiovascular:     Rate and Rhythm: Normal rate and regular rhythm.     Heart sounds: Normal heart sounds.  Pulmonary:     Effort: Pulmonary effort is normal.     Breath sounds: Normal breath sounds. No wheezing.  Abdominal:     Palpations: Abdomen is soft.  Musculoskeletal:        General: Normal range of motion.  Skin:    General: Skin is warm.  Neurological:     Mental Status: She is alert and oriented to person, place, and time.  Psychiatric:        Behavior: Behavior normal.     Imaging: CT Chest W Contrast Result Date: 05/28/2023 CLINICAL DATA:  New diagnosis of gynecologic malignancy. * Tracking Code: BO * EXAM: CT CHEST WITH CONTRAST TECHNIQUE: Multidetector CT imaging of the chest was performed during intravenous contrast administration. RADIATION DOSE REDUCTION: This exam was performed according to the departmental dose-optimization program which includes automated exposure control, adjustment of the mA and/or kV according to patient size and/or use of iterative reconstruction technique. CONTRAST:  75mL OMNIPAQUE IOHEXOL 300 MG/ML  SOLN COMPARISON:  Abdomen pelvis CT  05/13/2023.  Coronary CTA 06/19/2020 FINDINGS: Cardiovascular: Thoracic aorta is normal course and caliber with mild atherosclerotic calcified plaque. Heart is nonenlarged. No pericardial effusion. Coronary artery calcifications are noted. Mediastinum/Nodes: Normal caliber thoracic esophagus which is slightly patulous. Small thyroid gland. No specific abnormal lymph nodes identified in the axillary regions or hila. However there are several abnormal nodes identified including supraclavicular on the left, mediastinal, retrocrural and throughout the upper abdomen. Example includes right anterior cardiophrenic node on series 2, image 119 measuring 2.9 x 2.3 cm. Left-sided upper mediastinal node on image 59 of series 2 measures 1.5 by 1.4 cm. Left-sided thoracic inlet node on series 2, image 21 measures 19 by 13 mm. Again supraclavicular nodes are also seen. Please correlate with prior abdomen and pelvis CT. Lungs/Pleura: No consolidation, pneumothorax or effusion. There are some scattered calcified lung nodules identified consistent with old granulomatous disease. There is a punctate 2 mm noncalcified nodule left upper lobe series 8 image 68. Similar focus on image 50. Nonspecific. Upper Abdomen: Please correlate with prior abdomen and pelvis CT. Fatty liver infiltration identified. Peritoneal masses are identified as well as nodes. Musculoskeletal: Scattered degenerative changes of the spine. Fixation hardware seen along the lower cervical spine at the edge of the imaging field. IMPRESSION: Numerous abnormal lymph nodes identified throughout the mediastinum and supraclavicular region on the left. Areas in the mediastinum include thoracic inlet, paratracheal, prevascular, paraesophageal, retrocrural and anterior cardiophrenic space. Few scattered calcified lung nodules. There are some punctate noncalcified left-sided nodules under 3 mm. Nonspecific. Simple attention on follow up as per the patient's neoplasm. Please  correlate with the recent abdomen and pelvis CT. Aortic Atherosclerosis (ICD10-I70.0). Electronically Signed   By: Adrianna Horde M.D.   On: 05/28/2023 15:40   CT ABDOMEN PELVIS W CONTRAST Result Date: 05/22/2023 CLINICAL DATA:  Left abdominal pain EXAM: CT ABDOMEN AND PELVIS WITH CONTRAST TECHNIQUE: Multidetector CT imaging of the abdomen and pelvis was performed using the standard protocol following bolus administration  of intravenous contrast. RADIATION DOSE REDUCTION: This exam was performed according to the departmental dose-optimization program which includes automated exposure control, adjustment of the mA and/or kV according to patient size and/or use of iterative reconstruction technique. CONTRAST:  OMNIPAQUE IOHEXOL 300 MG/ML  SOLN COMPARISON:  07/30/2016 FINDINGS: Lower chest: Lung bases are clear. Hepatobiliary: Liver is within normal limits. Gallbladder is unremarkable. No intrahepatic or extrahepatic ductal dilatation. Pancreas: Within normal limits. Spleen: Within normal limits. Adrenals/Urinary Tract: Adrenal glands are within normal limits. Right kidney is within normal limits. Subcentimeter left upper pole renal cyst (series 2/image 17), benign (Bosniak I). No follow-up is recommended. No hydronephrosis. Bladder is underdistended but unremarkable. Stomach/Bowel: Stomach is within normal limits. No evidence of bowel obstruction. Appendix is not discretely visualized. 5.0 cm apple core lesion in the upper/mid rectum (series 2/image 86), highly suspicious for rectal adenocarcinoma. Vascular/Lymphatic: No evidence of abdominal aortic aneurysm. Atherosclerotic calcifications of the abdominal aorta and branch vessels. Abdominopelvic lymphadenopathy, including: --19 mm short axis node in portacaval region (series 2/image 23) --16 mm short axis right para-aortic node (series 2/image 29) --11 mm short axis right obturator node (series 2/image 59) Reproductive: Status post hysterectomy. No adnexal  masses. Other: No abdominopelvic ascites. Peritoneal disease/omental caking. Multiple cystic metastases in the abdomen/pelvis, including: --7.7 cm mass in the left upper abdomen (series 2/image 32) --Adjacent 4.0 cm mass beneath the left anterior abdominal wall (series 2/image 37) --14.4 cm bilobed cystic mass in the left mid abdomen (series 2/image 48) --7.6 cm mass in the right deep pelvis (series 2/image 65) Musculoskeletal: Visualized osseous structures are within normal limits. IMPRESSION: 5.0 cm apples core lesion in the upper/mid rectum, highly suspicious for rectal adenocarcinoma. Colonoscopy is suggested. Peritoneal disease/omental caking with multiple cystic metastases, as above. Abdominopelvic nodal metastases, as above. These results will be called to the ordering clinician or representative by the Radiologist Assistant, and communication documented in the PACS or [employer]. Electronically Signed   By: Zadie Herter M.D.   On: 05/22/2023 02:18    Labs:  CBC: Recent Labs    05/11/23 1413 05/27/23 1520  WBC 7.3 8.0  HGB 12.4 12.1  HCT 37.8 36.2  PLT 370.0 429*    COAGS: No results for input(s): "INR", "APTT" in the last 8760 hours.  BMP: Recent Labs    05/11/23 1413 05/27/23 1520  NA 136 138  K 3.7 3.4*  CL 100 101  CO2 29 26  GLUCOSE 96 101*  BUN 13 8  CALCIUM 9.3 9.7  CREATININE 0.67 0.65  GFRNONAA  --  >60    LIVER FUNCTION TESTS: Recent Labs    05/11/23 1413 05/27/23 1520  BILITOT 0.4 0.5  AST 19 19  ALT 11 13  ALKPHOS 100 86  PROT 8.5* 8.3*  ALBUMIN 4.1 4.1    TUMOR MARKERS: Recent Labs    05/27/23 1520  CEA 5.86*    Assessment and Plan:  Scheduled for Port a cath placement today Risks and benefits of image guided port-a-catheter placement was discussed with the patient including, but not limited to bleeding, infection, pneumothorax, or fibrin sheath development and need for additional procedures.  All of the patient's questions  were answered, patient is agreeable to proceed. Consent signed and in chart.  Thank you for this interesting consult.  I greatly enjoyed meeting Debra Nguyen and look forward to participating in their care.  A copy of this report was sent to the requesting provider on this date.  Electronically  Signed: Ellen Guppy, PA-C 05/31/2023, 10:58 AM   I spent a total of      20 minutes in face to face in clinical consultation, greater than 50% of which was counseling/coordinating care for port a cath placement

## 2023-05-31 NOTE — Procedures (Signed)
 Vascular and Interventional Radiology Procedure Note  Patient: Debra Nguyen DOB: 03-03-1955 Medical Record Number: [id number] Note Date/Time: 05/31/23 11:19 AM   Performing Physician: Art Largo, MD Assistant(s): None  Diagnosis: Ovarian cancer  Procedure: PORT PLACEMENT  Anesthesia: Conscious Sedation Complications: None Estimated Blood Loss: Minimal  Findings:  Successful right-sided port placement, with the tip of the catheter in the proximal right atrium.  Plan: Catheter ready for use.  See detailed procedure note with images in PACS. The patient tolerated the procedure well without incident or complication and was returned to Recovery in stable condition.    Art Largo, MD [facility]   Pager. [phone] Clinic. [phone]

## 2023-06-01 ENCOUNTER — Telehealth: Payer: Self-pay | Admitting: Obstetrics and Gynecology

## 2023-06-01 ENCOUNTER — Telehealth: Payer: Self-pay | Admitting: *Deleted

## 2023-06-01 ENCOUNTER — Encounter: Payer: Self-pay | Admitting: Nurse Practitioner

## 2023-06-01 NOTE — Telephone Encounter (Signed)
 Called to inquire if she needs to be NPO for chemo and if she will see Dr. Scherrie Curt tomorrow before her 1st treatment? Informed her that it is best to eat before coming and OK to also bring snacks. Can have one visitor for chemo ed and the treatment. Has not been planned to see provider tomorrow, but he can come back and check on her if needed. She said she has some questions for him. Encouraged her to write them down and go over them with navigator during her class tomorrow as she may be able to answer some of her questions.

## 2023-06-01 NOTE — Telephone Encounter (Signed)
 Phone call to patient regarding her new dx of gynecologic cancer and rectal mass.   Biopsy of rectal mass showing poorly differentiated carcinoma with mullerian primary, high grade serous carcinoma.  Patient has a cancer of the ovary, fallopian tube, or peritoneum.   She will start chemotherapy tomorrow and then see Dr. Orvil Bland from [facility] on 06/11/23.   We reviewed her chart together and prior pelvic US  and CT imaging in 2018 along with her last office visit.   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    Message of support given for her new dx.  She expresses appreciation for my care and my call.    She will cancel her breast and pelvic exam for May, 2025.   Her mammogram is up to date.

## 2023-06-02 ENCOUNTER — Inpatient Hospital Stay

## 2023-06-02 ENCOUNTER — Encounter: Payer: Self-pay | Admitting: *Deleted

## 2023-06-02 ENCOUNTER — Other Ambulatory Visit: Payer: Self-pay | Admitting: Oncology

## 2023-06-02 ENCOUNTER — Other Ambulatory Visit (HOSPITAL_BASED_OUTPATIENT_CLINIC_OR_DEPARTMENT_OTHER): Payer: Self-pay

## 2023-06-02 ENCOUNTER — Encounter: Payer: Self-pay | Admitting: Oncology

## 2023-06-02 ENCOUNTER — Other Ambulatory Visit: Payer: Self-pay | Admitting: *Deleted

## 2023-06-02 VITALS — BP 140/75 | HR 85 | Temp 98.3°F | Resp 18 | Ht 66.0 in | Wt 133.8 lb

## 2023-06-02 DIAGNOSIS — Z5111 Encounter for antineoplastic chemotherapy: Secondary | ICD-10-CM | POA: Diagnosis not present

## 2023-06-02 DIAGNOSIS — C579 Malignant neoplasm of female genital organ, unspecified: Secondary | ICD-10-CM

## 2023-06-02 MED ORDER — APREPITANT 130 MG/18ML IV EMUL
130.0000 mg | Freq: Once | INTRAVENOUS | Status: AC
  Administered 2023-06-02: 130 mg via INTRAVENOUS
  Filled 2023-06-02: qty 18

## 2023-06-02 MED ORDER — HEPARIN SOD (PORK) LOCK FLUSH 100 UNIT/ML IV SOLN: 500.0000 [IU] | Freq: Once | INTRAVENOUS | Status: DC | PRN

## 2023-06-02 MED ORDER — SODIUM CHLORIDE 0.9% FLUSH: 10.0000 mL | INTRAVENOUS | Status: DC | PRN

## 2023-06-02 MED ORDER — LIDOCAINE-PRILOCAINE 2.5-2.5 % EX CREA
1.0000 | TOPICAL_CREAM | CUTANEOUS | 0 refills | Status: AC | PRN
  Filled 2023-06-02: qty 30, 30d supply, fill #0

## 2023-06-02 MED ORDER — DIPHENHYDRAMINE HCL 50 MG/ML IJ SOLN
50.0000 mg | Freq: Once | INTRAMUSCULAR | Status: AC
  Administered 2023-06-02: 50 mg via INTRAVENOUS
  Filled 2023-06-02: qty 1

## 2023-06-02 MED ORDER — DEXAMETHASONE SODIUM PHOSPHATE 10 MG/ML IJ SOLN
10.0000 mg | Freq: Once | INTRAMUSCULAR | Status: AC
  Administered 2023-06-02: 10 mg via INTRAVENOUS
  Filled 2023-06-02: qty 1

## 2023-06-02 MED ORDER — FAMOTIDINE IN NACL 20-0.9 MG/50ML-% IV SOLN
20.0000 mg | Freq: Once | INTRAVENOUS | Status: AC
  Administered 2023-06-02: 20 mg via INTRAVENOUS
  Filled 2023-06-02: qty 50

## 2023-06-02 MED ORDER — SODIUM CHLORIDE 0.9 % IV SOLN
INTRAVENOUS | Status: DC
Start: 2023-06-02 — End: 2023-06-02

## 2023-06-02 MED ORDER — PALONOSETRON HCL INJECTION 0.25 MG/5ML
0.2500 mg | Freq: Once | INTRAVENOUS | Status: AC
  Administered 2023-06-02: 0.25 mg via INTRAVENOUS
  Filled 2023-06-02: qty 5

## 2023-06-02 MED ORDER — SODIUM CHLORIDE 0.9 % IV SOLN
175.0000 mg/m2 | Freq: Once | INTRAVENOUS | Status: AC
  Administered 2023-06-02: 300 mg via INTRAVENOUS
  Filled 2023-06-02: qty 50

## 2023-06-02 MED ORDER — SODIUM CHLORIDE 0.9 % IV SOLN
384.0000 mg | Freq: Once | INTRAVENOUS | Status: AC
Start: 2023-06-02 — End: 2023-06-02
  Administered 2023-06-02: 380 mg via INTRAVENOUS
  Filled 2023-06-02: qty 38

## 2023-06-02 NOTE — Progress Notes (Unsigned)
 PATIENT NAVIGATOR PROGRESS NOTE  Name: Debra Nguyen Date: 06/02/2023 MRN: [id number]  DOB: [birth year]   Reason for visit:  Patient education  Comments:  see ed note    Time spent counseling/coordinating care: > 60 minutes

## 2023-06-02 NOTE — Progress Notes (Signed)
 PATIENT NAVIGATOR PROGRESS NOTE  Name: Debra Nguyen Date: 06/02/2023 MRN: [id number]  DOB: [birth year]   Reason for visit:  Molecular testing  Comments:  [facility] testing requested on [id number] report and insurance information faxed to [place] [facility]    Time spent counseling/coordinating care: 30-45 minutes

## 2023-06-02 NOTE — Patient Instructions (Signed)
 [facility] - A DEPT OF [facility]  Discharge Instructions: Thank you for choosing [facility] to provide your oncology and hematology care.   If you have a lab appointment with the Cancer Center, please go directly to the Cancer Center and check in at the registration area.   Wear comfortable clothing and clothing appropriate for easy access to any Portacath or PICC line.   We strive to give you quality time with your provider. You may need to reschedule your appointment if you arrive late (15 or more minutes).  Arriving late affects you and other patients whose appointments are after yours.  Also, if you miss three or more appointments without notifying the office, you may be dismissed from the clinic at the provider's discretion.      For prescription refill requests, have your pharmacy contact our office and allow 72 hours for refills to be completed.    Today you received the following chemotherapy and/or immunotherapy agents taxol and carboplatin.      To help prevent nausea and vomiting after your treatment, we encourage you to take your nausea medication as directed.  BELOW ARE SYMPTOMS THAT SHOULD BE REPORTED IMMEDIATELY: *FEVER GREATER THAN 100.4 F (38 C) OR HIGHER *CHILLS OR SWEATING *NAUSEA AND VOMITING THAT IS NOT CONTROLLED WITH YOUR NAUSEA MEDICATION *UNUSUAL SHORTNESS OF BREATH *UNUSUAL BRUISING OR BLEEDING *URINARY PROBLEMS (pain or burning when urinating, or frequent urination) *BOWEL PROBLEMS (unusual diarrhea, constipation, pain near the anus) TENDERNESS IN MOUTH AND THROAT WITH OR WITHOUT PRESENCE OF ULCERS (sore throat, sores in mouth, or a toothache) UNUSUAL RASH, SWELLING OR PAIN  UNUSUAL VAGINAL DISCHARGE OR ITCHING   Items with * indicate a potential emergency and should be followed up as soon as possible or go to the Emergency Department if any problems should occur.  Please show the CHEMOTHERAPY ALERT CARD or  IMMUNOTHERAPY ALERT CARD at check-in to the Emergency Department and triage nurse.  Should you have questions after your visit or need to cancel or reschedule your appointment, please contact [facility] - A DEPT OF [facility]  Dept: [phone]  and follow the prompts.  Office hours are 8:00 a.m. to 4:30 p.m. Monday - Friday. Please note that voicemails left after 4:00 p.m. may not be returned until the following business day.  We are closed weekends and major holidays. You have access to a nurse at all times for urgent questions. Please call the main number to the clinic Dept: [phone] and follow the prompts.   For any non-urgent questions, you may also contact your provider using MyChart. We now offer e-Visits for anyone 57 and older to request care online for non-urgent symptoms. For details visit [email or web address].   Also download the MyChart app! Go to the app store, search "MyChart", open the app, select [place], and log in with your MyChart username and password.  Paclitaxel Injection What is this medication? PACLITAXEL (PAK li TAX el) treats some types of cancer. It works by slowing down the growth of cancer cells. This medicine may be used for other purposes; ask your health care provider or pharmacist if you have questions. COMMON BRAND NAME(S): Onxol, Taxol What should I tell my care team before I take this medication? They need to know if you have any of these conditions: Heart disease Liver disease Low white blood cell levels An unusual or allergic reaction to paclitaxel, other medications, foods, dyes, or  preservatives If you or your partner are pregnant or trying to get pregnant Breast-feeding How should I use this medication? This medication is injected into a vein. It is given by your care team in a hospital or clinic setting. Talk to your care team about the use of this medication in children. While it may be given to children  for selected conditions, precautions do apply. Overdosage: If you think you have taken too much of this medicine contact a poison control center or emergency room at once. NOTE: This medicine is only for you. Do not share this medicine with others. What if I miss a dose? Keep appointments for follow-up doses. It is important not to miss your dose. Call your care team if you are unable to keep an appointment. What may interact with this medication? Do not take this medication with any of the following: Live virus vaccines Other medications may affect the way this medication works. Talk with your care team about all of the medications you take. They may suggest changes to your treatment plan to lower the risk of side effects and to make sure your medications work as intended. This list may not describe all possible interactions. Give your health care provider a list of all the medicines, herbs, non-prescription drugs, or dietary supplements you use. Also tell them if you smoke, drink alcohol, or use illegal drugs. Some items may interact with your medicine. What should I watch for while using this medication? Your condition will be monitored carefully while you are receiving this medication. You may need blood work while taking this medication. This medication may make you feel generally unwell. This is not uncommon as chemotherapy can affect healthy cells as well as cancer cells. Report any side effects. Continue your course of treatment even though you feel ill unless your care team tells you to stop. This medication can cause serious allergic reactions. To reduce the risk, your care team may give you other medications to take before receiving this one. Be sure to follow the directions from your care team. This medication may increase your risk of getting an infection. Call your care team for advice if you get a fever, chills, sore throat, or other symptoms of a cold or flu. Do not treat yourself. Try  to avoid being around people who are sick. This medication may increase your risk to bruise or bleed. Call your care team if you notice any unusual bleeding. Be careful brushing or flossing your teeth or using a toothpick because you may get an infection or bleed more easily. If you have any dental work done, tell your dentist you are receiving this medication. Talk to your care team if you may be pregnant. Serious birth defects can occur if you take this medication during pregnancy. Talk to your care team before breastfeeding. Changes to your treatment plan may be needed. What side effects may I notice from receiving this medication? Side effects that you should report to your care team as soon as possible: Allergic reactions--skin rash, itching, hives, swelling of the face, lips, tongue, or throat Heart rhythm changes--fast or irregular heartbeat, dizziness, feeling faint or lightheaded, chest pain, trouble breathing Increase in blood pressure Infection--fever, chills, cough, sore throat, wounds that don't heal, pain or trouble when passing urine, general feeling of discomfort or being unwell Low blood pressure--dizziness, feeling faint or lightheaded, blurry vision Low red blood cell level--unusual weakness or fatigue, dizziness, headache, trouble breathing Painful swelling, warmth, or redness of the skin,  blisters or sores at the infusion site Pain, tingling, or numbness in the hands or feet Slow heartbeat--dizziness, feeling faint or lightheaded, confusion, trouble breathing, unusual weakness or fatigue Unusual bruising or bleeding Side effects that usually do not require medical attention (report to your care team if they continue or are bothersome): Diarrhea Hair loss Joint pain Loss of appetite Muscle pain Nausea Vomiting This list may not describe all possible side effects. Call your doctor for medical advice about side effects. You may report side effects to FDA at  1-800-FDA-1088. Where should I keep my medication? This medication is given in a hospital or clinic. It will not be stored at home. NOTE: This sheet is a summary. It may not cover all possible information. If you have questions about this medicine, talk to your doctor, pharmacist, or health care provider.  2024 Elsevier/Gold Standard (2021-06-24 00:00:00)  Carboplatin Injection What is this medication? CARBOPLATIN (KAR boe pla tin) treats some types of cancer. It works by slowing down the growth of cancer cells. This medicine may be used for other purposes; ask your health care provider or pharmacist if you have questions. COMMON BRAND NAME(S): Paraplatin What should I tell my care team before I take this medication? They need to know if you have any of these conditions: Blood disorders Hearing problems Kidney disease Recent or ongoing radiation therapy An unusual or allergic reaction to carboplatin, cisplatin, other medications, foods, dyes, or preservatives Pregnant or trying to get pregnant Breast-feeding How should I use this medication? This medication is injected into a vein. It is given by your care team in a hospital or clinic setting. Talk to your care team about the use of this medication in children. Special care may be needed. Overdosage: If you think you have taken too much of this medicine contact a poison control center or emergency room at once. NOTE: This medicine is only for you. Do not share this medicine with others. What if I miss a dose? Keep appointments for follow-up doses. It is important not to miss your dose. Call your care team if you are unable to keep an appointment. What may interact with this medication? Medications for seizures Some antibiotics, such as amikacin, gentamicin, neomycin, streptomycin, tobramycin Vaccines This list may not describe all possible interactions. Give your health care provider a list of all the medicines, herbs,  non-prescription drugs, or dietary supplements you use. Also tell them if you smoke, drink alcohol, or use illegal drugs. Some items may interact with your medicine. What should I watch for while using this medication? Your condition will be monitored carefully while you are receiving this medication. You may need blood work while taking this medication. This medication may make you feel generally unwell. This is not uncommon, as chemotherapy can affect healthy cells as well as cancer cells. Report any side effects. Continue your course of treatment even though you feel ill unless your care team tells you to stop. In some cases, you may be given additional medications to help with side effects. Follow all directions for their use. This medication may increase your risk of getting an infection. Call your care team for advice if you get a fever, chills, sore throat, or other symptoms of a cold or flu. Do not treat yourself. Try to avoid being around people who are sick. Avoid taking medications that contain aspirin, acetaminophen, ibuprofen, naproxen, or ketoprofen unless instructed by your care team. These medications may hide a fever. Be careful brushing or flossing  your teeth or using a toothpick because you may get an infection or bleed more easily. If you have any dental work done, tell your dentist you are receiving this medication. Talk to your care team if you wish to become pregnant or think you might be pregnant. This medication can cause serious birth defects. Talk to your care team about effective forms of contraception. Do not breast-feed while taking this medication. What side effects may I notice from receiving this medication? Side effects that you should report to your care team as soon as possible: Allergic reactions--skin rash, itching, hives, swelling of the face, lips, tongue, or throat Infection--fever, chills, cough, sore throat, wounds that don't heal, pain or trouble when passing  urine, general feeling of discomfort or being unwell Low red blood cell level--unusual weakness or fatigue, dizziness, headache, trouble breathing Pain, tingling, or numbness in the hands or feet, muscle weakness, change in vision, confusion or trouble speaking, loss of balance or coordination, trouble walking, seizures Unusual bruising or bleeding Side effects that usually do not require medical attention (report to your care team if they continue or are bothersome): Hair loss Nausea Unusual weakness or fatigue Vomiting This list may not describe all possible side effects. Call your doctor for medical advice about side effects. You may report side effects to FDA at 1-800-FDA-1088. Where should I keep my medication? This medication is given in a hospital or clinic. It will not be stored at home. NOTE: This sheet is a summary. It may not cover all possible information. If you have questions about this medicine, talk to your doctor, pharmacist, or health care provider.  2024 Elsevier/Gold Standard (2021-05-27 00:00:00)

## 2023-06-03 ENCOUNTER — Ambulatory Visit

## 2023-06-03 ENCOUNTER — Encounter: Payer: Self-pay | Admitting: *Deleted

## 2023-06-03 NOTE — Progress Notes (Signed)
 Received fax from [facility] that order for testing has been received  and they are working to obtain specimen. [id number]

## 2023-06-04 ENCOUNTER — Inpatient Hospital Stay

## 2023-06-04 VITALS — BP 150/79 | HR 80 | Temp 98.4°F | Resp 18

## 2023-06-04 DIAGNOSIS — Z5111 Encounter for antineoplastic chemotherapy: Secondary | ICD-10-CM | POA: Diagnosis not present

## 2023-06-04 DIAGNOSIS — C579 Malignant neoplasm of female genital organ, unspecified: Secondary | ICD-10-CM

## 2023-06-04 MED ORDER — PEGFILGRASTIM-CBQV 6 MG/0.6ML ~~LOC~~ SOSY
6.0000 mg | PREFILLED_SYRINGE | Freq: Once | SUBCUTANEOUS | Status: AC
  Administered 2023-06-04: 6 mg via SUBCUTANEOUS
  Filled 2023-06-04: qty 0.6

## 2023-06-04 NOTE — Patient Instructions (Signed)
 [facility] - A DEPT OF [facility]   Discharge Instructions: Thank you for choosing [facility] to provide your oncology and hematology care.   If you have a lab appointment with the Cancer Center, please go directly to the Cancer Center and check in at the registration area.   Wear comfortable clothing and clothing appropriate for easy access to any Portacath or PICC line.   We strive to give you quality time with your provider. You may need to reschedule your appointment if you arrive late (15 or more minutes).  Arriving late affects you and other patients whose appointments are after yours.  Also, if you miss three or more appointments without notifying the office, you may be dismissed from the clinic at the provider's discretion.      For prescription refill requests, have your pharmacy contact our office and allow 72 hours for refills to be completed.    Today you received the following chemotherapy and/or immunotherapy agents UDENYCA       To help prevent nausea and vomiting after your treatment, we encourage you to take your nausea medication as directed.  BELOW ARE SYMPTOMS THAT SHOULD BE REPORTED IMMEDIATELY: *FEVER GREATER THAN 100.4 F (38 C) OR HIGHER *CHILLS OR SWEATING *NAUSEA AND VOMITING THAT IS NOT CONTROLLED WITH YOUR NAUSEA MEDICATION *UNUSUAL SHORTNESS OF BREATH *UNUSUAL BRUISING OR BLEEDING *URINARY PROBLEMS (pain or burning when urinating, or frequent urination) *BOWEL PROBLEMS (unusual diarrhea, constipation, pain near the anus) TENDERNESS IN MOUTH AND THROAT WITH OR WITHOUT PRESENCE OF ULCERS (sore throat, sores in mouth, or a toothache) UNUSUAL RASH, SWELLING OR PAIN  UNUSUAL VAGINAL DISCHARGE OR ITCHING   Items with * indicate a potential emergency and should be followed up as soon as possible or go to the Emergency Department if any problems should occur.  Please show the CHEMOTHERAPY ALERT CARD or IMMUNOTHERAPY  ALERT CARD at check-in to the Emergency Department and triage nurse.  Should you have questions after your visit or need to cancel or reschedule your appointment, please contact [facility]  Dept: [phone]  and follow the prompts.  Office hours are 8:00 a.m. to 4:30 p.m. Monday - Friday. Please note that voicemails left after 4:00 p.m. may not be returned until the following business day.  We are closed weekends and major holidays. You have access to a nurse at all times for urgent questions. Please call the main number to the clinic Dept: [phone] and follow the prompts.   For any non-urgent questions, you may also contact your provider using MyChart. We now offer e-Visits for anyone 31 and older to request care online for non-urgent symptoms. For details visit [email or web address].   Also download the MyChart app! Go to the app store, search "MyChart", open the app, select [place], and log in with your MyChart username and password.  Pegfilgrastim  Injection What is this medication? PEGFILGRASTIM  (PEG fil gra stim) lowers the risk of infection in people who are receiving chemotherapy. It works by Systems analyst make more white blood cells, which protects your body from infection. It may also be used to help people who have been exposed to high doses of radiation. This medicine may be used for other purposes; ask your health care provider or pharmacist if you have questions. COMMON BRAND NAME(S): Fulphila , Fylnetra , Neulasta , Nyvepria , Stimufend , UDENYCA , UDENYCA  ONBODY, Ziextenzo  What should I tell my care team before  I take this medication? They need to know if you have any of these conditions: Kidney disease Latex allergy Ongoing radiation therapy Sickle cell disease Skin reactions to acrylic adhesives (On-Body Injector only) An unusual or allergic reaction to pegfilgrastim , filgrastim, other medications, foods, dyes,  or preservatives Pregnant or trying to get pregnant Breast-feeding How should I use this medication? This medication is for injection under the skin. If you get this medication at home, you will be taught how to prepare and give the pre-filled syringe or how to use the On-body Injector. Refer to the patient Instructions for Use for detailed instructions. Use exactly as directed. Tell your care team immediately if you suspect that the On-body Injector may not have performed as intended or if you suspect the use of the On-body Injector resulted in a missed or partial dose. It is important that you put your used needles and syringes in a special sharps container. Do not put them in a trash can. If you do not have a sharps container, call your pharmacist or care team to get one. Talk to your care team about the use of this medication in children. While this medication may be prescribed for selected conditions, precautions do apply. Overdosage: If you think you have taken too much of this medicine contact a poison control center or emergency room at once. NOTE: This medicine is only for you. Do not share this medicine with others. What if I miss a dose? It is important not to miss your dose. Call your care team if you miss your dose. If you miss a dose due to an On-body Injector failure or leakage, a new dose should be administered as soon as possible using a single prefilled syringe for manual use. What may interact with this medication? Interactions have not been studied. This list may not describe all possible interactions. Give your health care provider a list of all the medicines, herbs, non-prescription drugs, or dietary supplements you use. Also tell them if you smoke, drink alcohol, or use illegal drugs. Some items may interact with your medicine. What should I watch for while using this medication? Your condition will be monitored carefully while you are receiving this medication. You may need  blood work done while you are taking this medication. Talk to your care team about your risk of cancer. You may be more at risk for certain types of cancer if you take this medication. If you are going to need a MRI, CT scan, or other procedure, tell your care team that you are using this medication (On-Body Injector only). What side effects may I notice from receiving this medication? Side effects that you should report to your care team as soon as possible: Allergic reactions--skin rash, itching, hives, swelling of the face, lips, tongue, or throat Capillary leak syndrome--stomach or muscle pain, unusual weakness or fatigue, feeling faint or lightheaded, decrease in the amount of urine, swelling of the ankles, hands, or feet, trouble breathing High white blood cell level--fever, fatigue, trouble breathing, night sweats, change in vision, weight loss Inflammation of the aorta--fever, fatigue, back, chest, or stomach pain, severe headache Kidney injury (glomerulonephritis)--decrease in the amount of urine, red or dark brown urine, foamy or bubbly urine, swelling of the ankles, hands, or feet Shortness of breath or trouble breathing Spleen injury--pain in upper left stomach or shoulder Unusual bruising or bleeding Side effects that usually do not require medical attention (report to your care team if they continue or are bothersome): Bone pain  Pain in the hands or feet This list may not describe all possible side effects. Call your doctor for medical advice about side effects. You may report side effects to FDA at 1-800-FDA-1088. Where should I keep my medication? Keep out of the reach of children. If you are using this medication at home, you will be instructed on how to store it. Throw away any unused medication after the expiration date on the label. NOTE: This sheet is a summary. It may not cover all possible information. If you have questions about this medicine, talk to your doctor,  pharmacist, or health care provider.  2024 Elsevier/Gold Standard (2021-01-03 00:00:00)

## 2023-06-07 ENCOUNTER — Encounter: Payer: Self-pay | Admitting: *Deleted

## 2023-06-07 ENCOUNTER — Encounter: Payer: Self-pay | Admitting: Gynecologic Oncology

## 2023-06-07 NOTE — Progress Notes (Signed)
 Received fax from [facility] that specimen has been received and testing will proceed. [id number]

## 2023-06-10 ENCOUNTER — Other Ambulatory Visit: Payer: Self-pay | Admitting: *Deleted

## 2023-06-10 ENCOUNTER — Other Ambulatory Visit (HOSPITAL_BASED_OUTPATIENT_CLINIC_OR_DEPARTMENT_OTHER): Payer: Self-pay

## 2023-06-10 MED ORDER — ZOLPIDEM TARTRATE 10 MG PO TABS
10.0000 mg | ORAL_TABLET | Freq: Every evening | ORAL | 1 refills | Status: DC | PRN
  Filled 2023-06-10: qty 30, 30d supply, fill #0

## 2023-06-10 MED ORDER — ZOLPIDEM TARTRATE 10 MG PO TABS: 10.0000 mg | ORAL_TABLET | Freq: Every evening | ORAL | 1 refills | Status: DC | PRN

## 2023-06-10 NOTE — Progress Notes (Unsigned)
 GYNECOLOGIC ONCOLOGY RETURN CLINIC VISIT  Patient Name: Debra Nguyen  Patient Age: 68 y.o. Date of Service: 06/11/23 Referring Provider: Dr. 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  Patient is overall doing very well, relatively asymptomatic despite her large disease burden.  Tolerated cycle 1 of chemotherapy without side effects and feels some improvements already in her abdominal distention.  We discussed her diagnosis, which based on biopsy from her sigmoid colon revealed mllerian carcinoma.  We reviewed diagnoses including ovarian, fallopian tube, and primary peritoneal cancer in their treatment.  I reviewed in detail with the patient her recent chest imaging as well as the CT of her abdomen and pelvis.    Based on imaging, she has stage IVb disease.  We reviewed that the treatment approach for this disease (whether ovarian, fallopian tube, or primary peritoneal) is typically combination of cytoreductive surgery and chemotherapy. I discussed that sequencing of this can be either with upfront debulking followed by adjuvant chemotherapy sequentially or neoadjuvant chemotherapy followed by an interval cytoreductive attempt, then additional chemotherapy. This latter approach is associated with a reduced perioperative morbidity at the time of surgery.  I discussed that decisions regarding sequencing of therapy is individualized taking into account individual patient health, in addition to the apparent tumor distribution on imaging, and likelihood of complete surgical resection at the time of surgery. I discussed that the overall survival observed in patients is equivalent for both approaches (neoadjuvant chemotherapy versus primary debulking surgery) provided that there is an optimal cytoreductive effort at the time of  surgery (regardless of the timing of that surgery).   Given her disease burden and disease outside of her abdominal cavity, recommendation was made to proceed with neoadjuvant chemotherapy.  She is aware of the plan to start with 3 cycles of chemotherapy and repeat imaging.  At that time, we will meet to discuss whether she is a candidate for interval debulking surgery.  Given her upper abdominal disease, we would plan for surgery at [facility], with surgical oncology available.  While I typically consider the addition of bevacizumab to carboplatin  and paclitaxel  in the setting of significant ascites, stage IV disease, or unresectable stage III disease, I worry about toxicity to her given large intraluminal mass within her sigmoid colon.  It appears that next-generation sequencing has been sent.  We discussed the difference between genomic and genetic testing.  I placed a referral for her to meet with one of our genetic counselors to undergo germline genetic testing.  A copy of this note was sent to the patient's referring provider.   70 minutes of total time was spent for this patient encounter, including preparation, face-to-face counseling with the patient and coordination of care, and documentation of the encounter.  Wiley Hanger, MD  Division of Gynecologic Oncology  Department of Obstetrics and Gynecology  [facility]  ___________________________________________  Chief Complaint: No chief complaint on file.   History of Present Illness:  Debra Nguyen is a 68 y.o. y.o. female who is seen in consultation at the request of Dr. Scherrie Curt for an evaluation of metastatic gynecologic cancer.  The patient has history of intermittent diarrhea starting in late 2022, improved with dietary changes and Benefiber.  She followed with GI, seen in April 2024.  Continue dietary modifications, Benefiber, stool softener were recommended for her IBS.  Patient  then called in March  of this year with report of recent food poisoning that resulted in bloody diarrhea.  Since that time, she struggled with abdominal distention and worsening irregularities of her bowel movements.  She saw a gastroenterologist at the end of March.  Given reported left-sided abdominal fullness and tenderness with difference appreciated on exam, CT scan was ordered. CT abdomen and pelvis on 05/13/2023: 5 cm apple core lesion in the upper/mid rectum, highly suspicious for rectal adenocarcinoma.  Peritoneal/omental disease with multiple cystic metastases.  Abdominal pelvic nodal metastases. 05/25/2023: Colonoscopy performed.  Infiltrative partially obstructing large mass was found in the rectosigmoid colon, 20-28 cm from the anal verge.  Mass circumferential measuring 8 cm in length.  Biopsies performed. Pathology reveals poorly differentiated carcinoma consistent with mllerian primary, favoring high-grade serous carcinoma.  CK7 and PAX8 positive, ER weakly positive.  Strong p16 positivity.  P53 mutated.  Focal CK5/6 positivity. Tumor markers on 05/27/2023: CA125: 2,656 CEA: 5.86 CT chest on 05/28/2023: Numerous abnormal lymph nodes identified throughout the mediastinum and supraclavicular region on the left.  Few scattered calcified lung nodules, some punctate noncalcified left-sided nodules under 3 mm, nonspecific. 06/02/2023: Cycle 1 carboplatin  and paclitaxel .  Today, the patient comes in with her sister.  She notes overall feeling good.  She denies any side effects related to her recent chemotherapy other than some difficulty with sleeping secondary to steroids.  She feels that her abdominal distention is already improving.  She is taking MiraLAX  and having daily bowel function although has held it the last few days due to some looser stools.  She denies any urinary symptoms.  Denies any vaginal bleeding or discharge.  She endorses a good appetite without nausea or emesis.  Endorses normal energy levels.  The  patient has a long history of cervical/vaginal dysplasia, previously seen in our clinic for this.  Most recent Pap and biopsy results continue to show low-grade dysplasia.  PAST MEDICAL HISTORY:  Past Medical History:  Diagnosis Date   Allergy    Anxiety    Through divorce   Arthritis    ASCUS with positive high risk HPV 12/2006   Negative Colpo BX   ASCUS with positive high risk HPV 2009   Chronic kidney disease    kidney stones   CIN III (cervical intraepithelial neoplasia III) 07/1983   tx'd w/cryo- no subsequent abnormal paps until 2008   Essential hypertension    Fibroid 2006   3 cm Right Fundal Fibroid   GERD (gastroesophageal reflux disease) 2011   History of kidney stones    Hypertension    Osteopenia 2020   Synovial cyst of lumbar spine 10/2014   Vitamin D  deficiency      PAST SURGICAL HISTORY:  Past Surgical History:  Procedure Laterality Date   ABDOMINAL HYSTERECTOMY  02/23/2011   R TLH-fibroids, adenomyosis 221 g, & focal CIN I w/clear margins   ANTERIOR CERVICAL DECOMP/DISCECTOMY FUSION N/A 07/19/2012   Procedure: ANTERIOR CERVICAL DECOMPRESSION/DISCECTOMY FUSION 2 LEVELS;  Surgeon: Manya Sells, MD;  Location: [facility];  Service: Neurosurgery;  Laterality: N/A;  Anterior Cervical Five-Six Cervical Six-Seven Anterior Cervical Decompression/Diskectomy/Fusion   cataracts Bilateral 03/2022   COLONOSCOPY     CYSTOSCOPY  02/23/2011   PT.STATES SHE DID NOT HAVE   dental implants     IR IMAGING GUIDED PORT INSERTION  05/31/2023   LASIK  '99   LUMBAR LAMINECTOMY/DECOMPRESSION MICRODISCECTOMY Right 02/05/2015   Procedure: Right Lumbar four-five Laminectomy with Resection of Synovial Cyst ;  Surgeon: Manya Sells, MD;  Location: [facility];  Service: Neurosurgery;  Laterality: Right;   MOHS SURGERY     x 2   MOLE REMOVAL  2023   REPLACEMENT TOTAL KNEE Right 09/2021   TONSILLECTOMY AND ADENOIDECTOMY     VAGINAL DELIVERY  '92 '94    OB/GYN HISTORY:  OB History   Gravida Para Term Preterm AB Living  2 2 2   2   SAB IAB Ectopic Multiple Live Births      2    # Outcome Date GA Lbr Len/2nd Weight Sex Type Anes PTL Lv  2 Term 27    M Vag-Spont   LIV  1 Term 38    M Vag-Spont   LIV    Patient's last menstrual period was 02/02/2011.  MEDICATIONS: Outpatient Encounter Medications as of 06/11/2023  Medication Sig   ALPRAZolam  (XANAX ) 0.25 MG tablet Take 1 tablet (0.25 mg total) by mouth at bedtime as needed for anxiety.   Calcium  Carb-Cholecalciferol (CALCIUM  500+D3 PO) Take 1 tablet by mouth daily. (Patient not taking: Reported on 05/27/2023)   cholecalciferol (VITAMIN D3) 25 MCG (1000 UNIT) tablet Take 1,000 Units by mouth 3 (three) times a week. (Patient not taking: Reported on 05/27/2023)   [EXPIRED] dexamethasone  (DECADRON ) 2 MG tablet Take 5 tablets (10 mg total) by mouth daily for 2 doses. Take 10 mg at 6pm night before first chemo and 6 am morning of first chemo   hydrochlorothiazide  (HYDRODIURIL ) 12.5 MG tablet Take 1 tablet (12.5 mg total) by mouth every morning.   losartan  (COZAAR ) 25 MG tablet Take 1 tablet (25 mg total) by mouth daily.   Magnesium Hydroxide (DULCOLAX PO) Take 1 tablet by mouth as directed. (Patient not taking: Reported on 05/27/2023)   metroNIDAZOLE  (METROCREAM ) 0.75 % cream Apply 1 application topically to affected area 2 (two) times daily. (Patient not taking: Reported on 05/27/2023)   Multiple Vitamins-Minerals (MULTIVITAMINS THER. W/MINERALS) TABS Take 1 tablet by mouth daily. (Patient not taking: Reported on 05/27/2023)   ondansetron  (ZOFRAN -ODT) 4 MG disintegrating tablet Dissolve 1 tablet (4 mg total) by mouth every 8 (eight) hours as needed for nausea or vomiting. (Patient not taking: Reported on 05/27/2023)   polyethylene glycol (MIRALAX  / GLYCOLAX ) 17 g packet Take 17 g by mouth daily.   prochlorperazine  (COMPAZINE ) 10 MG tablet Take 1 tablet (10 mg total) by mouth every 6 (six) hours as needed for nausea or vomiting.    rosuvastatin  (CRESTOR ) 5 MG tablet Take 1 tablet (5 mg total) by mouth daily.   senna (SENOKOT) 8.6 MG tablet Take 1 tablet by mouth as needed for constipation. (Patient not taking: Reported on 05/27/2023)   vitamin B-12 (CYANOCOBALAMIN) 100 MCG tablet Take 100 mcg by mouth 3 (three) times a week. (Patient not taking: Reported on 05/27/2023)   [DISCONTINUED] estradiol  (ESTRACE ) 0.5 MG tablet Take 1 tablet (0.5 mg total) by mouth daily.   [DISCONTINUED] Wheat Dextrin (BENEFIBER PO) Take by mouth. Takes two teas spoon daily   No facility-administered encounter medications on file as of 06/11/2023.    ALLERGIES:  Allergies  Allergen Reactions   Codeine Itching   Ultram  [Tramadol ] Itching     FAMILY HISTORY:  Family History  Problem Relation Age of Onset   Hypertension Mother    Heart disease Mother    Cancer Mother        lung   Hypertension Father    Heart attack Father    Colon cancer Neg Hx  Esophageal cancer Neg Hx    Pancreatic cancer Neg Hx    Prostate cancer Neg Hx    Rectal cancer Neg Hx    Stomach cancer Neg Hx    Breast cancer Neg Hx      SOCIAL HISTORY:  Social Connections: Not on file    REVIEW OF SYSTEMS:  + anxiety Denies appetite changes, fevers, chills, fatigue, unexplained weight changes. Denies hearing loss, neck lumps or masses, mouth sores, ringing in ears or voice changes. Denies cough or wheezing.  Denies shortness of breath. Denies chest pain or palpitations. Denies leg swelling. Denies abdominal distention, pain, blood in stools, constipation, diarrhea, nausea, vomiting, or early satiety. Denies pain with intercourse, dysuria, frequency, hematuria or incontinence. Denies hot flashes, pelvic pain, vaginal bleeding or vaginal discharge.   Denies joint pain, back pain or muscle pain/cramps. Denies itching, rash, or wounds. Denies dizziness, headaches, numbness or seizures. Denies swollen lymph nodes or glands, denies easy bruising or  bleeding. Denies depression, confusion, or decreased concentration.  Physical Exam:  Vital Signs for this encounter:  Blood pressure 131/78, pulse (!) 102, temperature 98.4 F (36.9 C), temperature source Oral, resp. rate 16, height 5' 7.91" (1.725 m), weight 133 lb (60.3 kg), last menstrual period 02/02/2011, SpO2 100%. Body mass index is 20.27 kg/m. General: Alert, oriented, no acute distress.  HEENT: Normocephalic, atraumatic. Sclera anicteric.  Chest: Clear to auscultation bilaterally. No wheezes, rhonchi, or rales. Cardiovascular: Regular rate and rhythm, no murmurs, rubs, or gallops.  Abdomen: Normoactive bowel sounds. Soft, mildly distended, nontender to palpation.  Mass appreciated in the left pelvis spanning up to the level of the umbilicus, minimal mobility.  No palpable fluid wave.  Extremities: Grossly normal range of motion. Warm, well perfused. No edema bilaterally.  Skin: No rashes or lesions.  Lymphatics: No cervical, supraclavicular, or inguinal adenopathy.  GU:  Normal external female genitalia. No lesions. No discharge or bleeding.             Bladder/urethra:  No lesions or masses, well supported bladder             Vagina: Moderately atrophic, no lesions.             Cervix/uterus: Surgically absent.             Adnexa: Bimanual exam, the vaginal cuff is smooth without nodularity.  There is some fullness although the deep pelvic mass is much better appreciated on rectovaginal exam.  Rectal: On rectovaginal exam, there is a fluctuant mass sitting anterior to the rectum and above the vaginal cuff that causes some mass effect on the rectum itself.  At the very tip of where I can reach within the rectum is some nodularity.  LABORATORY AND RADIOLOGIC DATA:  Outside medical records were reviewed to synthesize the above history, along with the history and physical obtained during the visit.   Lab Results  Component Value Date   WBC 28.1 (H) 06/11/2023   HGB 10.7 (L)  06/11/2023   HCT 32.3 (L) 06/11/2023   PLT 296 06/11/2023   GLUCOSE 101 (H) 05/27/2023   CHOL 185 01/22/2022   TRIG 69 01/22/2022   HDL 94 01/22/2022   LDLCALC 78 01/22/2022   ALT 13 05/27/2023   AST 19 05/27/2023   NA 138 05/27/2023   K 3.4 (L) 05/27/2023   CL 101 05/27/2023   CREATININE 0.65 05/27/2023   BUN 8 05/27/2023   CO2 26 05/27/2023   HGBA1C 5.4 04/10/2021

## 2023-06-10 NOTE — Progress Notes (Signed)
 Patient requesting Ambien  for insomnia. OK for Ambien  10 mg prn per Dr. Scherrie Curt.

## 2023-06-11 ENCOUNTER — Encounter: Payer: Self-pay | Admitting: Gynecologic Oncology

## 2023-06-11 ENCOUNTER — Inpatient Hospital Stay: Admitting: Gynecologic Oncology

## 2023-06-11 ENCOUNTER — Inpatient Hospital Stay

## 2023-06-11 ENCOUNTER — Other Ambulatory Visit

## 2023-06-11 VITALS — BP 131/78 | HR 102 | Temp 98.4°F | Resp 16 | Ht 67.91 in | Wt 133.0 lb

## 2023-06-11 DIAGNOSIS — C579 Malignant neoplasm of female genital organ, unspecified: Secondary | ICD-10-CM

## 2023-06-11 DIAGNOSIS — R971 Elevated cancer antigen 125 [CA 125]: Secondary | ICD-10-CM

## 2023-06-11 DIAGNOSIS — N891 Moderate vaginal dysplasia: Secondary | ICD-10-CM

## 2023-06-11 DIAGNOSIS — Z5111 Encounter for antineoplastic chemotherapy: Secondary | ICD-10-CM | POA: Diagnosis not present

## 2023-06-11 DIAGNOSIS — R978 Other abnormal tumor markers: Secondary | ICD-10-CM

## 2023-06-11 LAB — CBC WITH DIFFERENTIAL (CANCER CENTER ONLY)
Abs Immature Granulocytes: 3.58 10*3/uL — ABNORMAL HIGH (ref 0.00–0.07)
Basophils Absolute: 0 10*3/uL (ref 0.0–0.1)
Basophils Relative: 0 %
Eosinophils Absolute: 0.3 10*3/uL (ref 0.0–0.5)
Eosinophils Relative: 1 %
HCT: 32.3 % — ABNORMAL LOW (ref 36.0–46.0)
Hemoglobin: 10.7 g/dL — ABNORMAL LOW (ref 12.0–15.0)
Immature Granulocytes: 13 %
Lymphocytes Relative: 9 %
Lymphs Abs: 2.4 10*3/uL (ref 0.7–4.0)
MCH: 29.1 pg (ref 26.0–34.0)
MCHC: 33.1 g/dL (ref 30.0–36.0)
MCV: 87.8 fL (ref 80.0–100.0)
Monocytes Absolute: 1.8 10*3/uL — ABNORMAL HIGH (ref 0.1–1.0)
Monocytes Relative: 6 %
Neutro Abs: 19.9 10*3/uL — ABNORMAL HIGH (ref 1.7–7.7)
Neutrophils Relative %: 71 %
Platelet Count: 296 10*3/uL (ref 150–400)
RBC: 3.68 MIL/uL — ABNORMAL LOW (ref 3.87–5.11)
RDW: 13.9 % (ref 11.5–15.5)
Smear Review: ADEQUATE
WBC Count: 28.1 10*3/uL — ABNORMAL HIGH (ref 4.0–10.5)
nRBC: 0.1 % (ref 0.0–0.2)

## 2023-06-11 NOTE — Patient Instructions (Signed)
 It was good to see you today.  Please reach out if you need anything between now and when we see you next, likely in early to mid June.

## 2023-06-11 NOTE — Progress Notes (Signed)
 Zannie Hey presented for lab work. Labs per MD order drawn via Peripheral Line 23 gauge needle inserted in the left antecubital  Good blood return present. Procedure without incident.  Needle removed intact. Patient tolerated procedure well.

## 2023-06-11 NOTE — Patient Instructions (Signed)
 [facility] - A DEPT OF [facility]  Discharge Instructions: Thank you for choosing [facility] to provide your oncology and hematology care.   If you have a lab appointment with the Cancer Center, please go directly to the Cancer Center and check in at the registration area.   Wear comfortable clothing and clothing appropriate for easy access to any Portacath or PICC line.   We strive to give you quality time with your provider. You may need to reschedule your appointment if you arrive late (15 or more minutes).  Arriving late affects you and other patients whose appointments are after yours.  Also, if you miss three or more appointments without notifying the office, you may be dismissed from the clinic at the provider's discretion.      For prescription refill requests, have your pharmacy contact our office and allow 72 hours for refills to be completed.    Today you had your labs drawn peripherally per pt request    To help prevent nausea and vomiting after your treatment, we encourage you to take your nausea medication as directed.  BELOW ARE SYMPTOMS THAT SHOULD BE REPORTED IMMEDIATELY: *FEVER GREATER THAN 100.4 F (38 C) OR HIGHER *CHILLS OR SWEATING *NAUSEA AND VOMITING THAT IS NOT CONTROLLED WITH YOUR NAUSEA MEDICATION *UNUSUAL SHORTNESS OF BREATH *UNUSUAL BRUISING OR BLEEDING *URINARY PROBLEMS (pain or burning when urinating, or frequent urination) *BOWEL PROBLEMS (unusual diarrhea, constipation, pain near the anus) TENDERNESS IN MOUTH AND THROAT WITH OR WITHOUT PRESENCE OF ULCERS (sore throat, sores in mouth, or a toothache) UNUSUAL RASH, SWELLING OR PAIN  UNUSUAL VAGINAL DISCHARGE OR ITCHING   Items with * indicate a potential emergency and should be followed up as soon as possible or go to the Emergency Department if any problems should occur.  Please show the CHEMOTHERAPY ALERT CARD or IMMUNOTHERAPY ALERT CARD at check-in to the  Emergency Department and triage nurse.  Should you have questions after your visit or need to cancel or reschedule your appointment, please contact [facility] - A DEPT OF [facility]  Dept: [phone]  and follow the prompts.  Office hours are 8:00 a.m. to 4:30 p.m. Monday - Friday. Please note that voicemails left after 4:00 p.m. may not be returned until the following business day.  We are closed weekends and major holidays. You have access to a nurse at all times for urgent questions. Please call the main number to the clinic Dept: [phone] and follow the prompts.   For any non-urgent questions, you may also contact your provider using MyChart. We now offer e-Visits for anyone 31 and older to request care online for non-urgent symptoms. For details visit [email or web address].   Also download the MyChart app! Go to the app store, search "MyChart", open the app, select , and log in with your MyChart username and password.

## 2023-06-12 DIAGNOSIS — C579 Malignant neoplasm of female genital organ, unspecified: Secondary | ICD-10-CM | POA: Diagnosis not present

## 2023-06-15 ENCOUNTER — Encounter: Payer: Self-pay | Admitting: Genetic Counselor

## 2023-06-16 ENCOUNTER — Encounter: Payer: Self-pay | Admitting: Genetic Counselor

## 2023-06-16 ENCOUNTER — Inpatient Hospital Stay

## 2023-06-16 ENCOUNTER — Other Ambulatory Visit: Payer: Self-pay | Admitting: Genetic Counselor

## 2023-06-16 ENCOUNTER — Inpatient Hospital Stay: Admitting: Genetic Counselor

## 2023-06-16 DIAGNOSIS — Z800 Family history of malignant neoplasm of digestive organs: Secondary | ICD-10-CM | POA: Diagnosis not present

## 2023-06-16 DIAGNOSIS — C569 Malignant neoplasm of unspecified ovary: Secondary | ICD-10-CM | POA: Insufficient documentation

## 2023-06-16 DIAGNOSIS — C579 Malignant neoplasm of female genital organ, unspecified: Secondary | ICD-10-CM

## 2023-06-16 DIAGNOSIS — C5700 Malignant neoplasm of unspecified fallopian tube: Secondary | ICD-10-CM | POA: Insufficient documentation

## 2023-06-16 DIAGNOSIS — K6289 Other specified diseases of anus and rectum: Secondary | ICD-10-CM

## 2023-06-16 DIAGNOSIS — Z5111 Encounter for antineoplastic chemotherapy: Secondary | ICD-10-CM | POA: Diagnosis not present

## 2023-06-16 LAB — CBC WITH DIFFERENTIAL (CANCER CENTER ONLY)
Abs Immature Granulocytes: 0.57 10*3/uL — ABNORMAL HIGH (ref 0.00–0.07)
Basophils Absolute: 0.2 10*3/uL — ABNORMAL HIGH (ref 0.0–0.1)
Basophils Relative: 1 %
Eosinophils Absolute: 0 10*3/uL (ref 0.0–0.5)
Eosinophils Relative: 0 %
HCT: 34.9 % — ABNORMAL LOW (ref 36.0–46.0)
Hemoglobin: 11.6 g/dL — ABNORMAL LOW (ref 12.0–15.0)
Immature Granulocytes: 3 %
Lymphocytes Relative: 5 %
Lymphs Abs: 1.1 10*3/uL (ref 0.7–4.0)
MCH: 28.5 pg (ref 26.0–34.0)
MCHC: 33.2 g/dL (ref 30.0–36.0)
MCV: 85.7 fL (ref 80.0–100.0)
Monocytes Absolute: 0.8 10*3/uL (ref 0.1–1.0)
Monocytes Relative: 4 %
Neutro Abs: 19.8 10*3/uL — ABNORMAL HIGH (ref 1.7–7.7)
Neutrophils Relative %: 87 %
Platelet Count: 277 10*3/uL (ref 150–400)
RBC: 4.07 MIL/uL (ref 3.87–5.11)
RDW: 14 % (ref 11.5–15.5)
WBC Count: 22.4 10*3/uL — ABNORMAL HIGH (ref 4.0–10.5)
nRBC: 0 % (ref 0.0–0.2)

## 2023-06-16 LAB — GENETIC SCREENING ORDER

## 2023-06-16 NOTE — Progress Notes (Signed)
 REFERRING PROVIDER: Suzi Essex, MD [address] [place],  [place] [address]  PRIMARY PROVIDER:  Elester Grim, MD  PRIMARY REASON FOR VISIT:  1. Family history of colon cancer   2. Malignant neoplasm of ovary, unspecified laterality (HCC)      HISTORY OF PRESENT ILLNESS:   Debra Nguyen, a 68 y.o. female, was seen for a [facility] cancer genetics consultation at the request of Dr. Orvil Bland due to a personal and family history of cancer.  Debra Nguyen presents to clinic today to discuss the possibility of a hereditary predisposition to cancer, genetic testing, and to further clarify her future cancer risks, as well as potential cancer risks for family members.   In April 2025, at the age of 15, Debra Nguyen was diagnosed with ovarian cancer.    CANCER HISTORY:  Oncology History  Gynecologic cancer [facility])  05/27/2023 Initial Diagnosis   Gynecologic cancer (HCC)   05/27/2023 Cancer Staging   Staging form: Ovary, Fallopian Tube, and Primary Peritoneal Carcinoma, AJCC 8th Edition - Clinical: FIGO Stage IV (cTX, cN1, cM1) - Signed by Sumner Ends, MD on 05/27/2023 Stage prefix: Initial diagnosis   06/02/2023 -  Chemotherapy   Patient is on Treatment Plan : OVARIAN Carboplatin  (AUC 6) + Paclitaxel  (175) q21d X 6 Cycles        RISK FACTORS:  Menarche was at age 59.  First live birth at age 14.  Ovaries intact: yes.  Hysterectomy: no.  Menopausal status: postmenopausal.  Colonoscopy: yes; normal. Mammogram within the last year: yes. Number of breast biopsies: 0. Up to date with pelvic exams: yes. Any excessive radiation exposure in the past: no  Past Medical History:  Diagnosis Date   Allergy    Anxiety    Through divorce   Arthritis    ASCUS with positive high risk HPV 12/2006   Negative Colpo BX   ASCUS with positive high risk HPV 2009   Chronic kidney disease    kidney stones   CIN III (cervical intraepithelial neoplasia III) 07/1983   tx'd w/cryo- no  subsequent abnormal paps until 2008   Essential hypertension    Family history of colon cancer    Fibroid 2006   3 cm Right Fundal Fibroid   GERD (gastroesophageal reflux disease) 2011   History of kidney stones    Hypertension    Osteopenia 2020   Synovial cyst of lumbar spine 10/2014   Vitamin D  deficiency     Past Surgical History:  Procedure Laterality Date   ABDOMINAL HYSTERECTOMY  02/23/2011   R TLH-fibroids, adenomyosis 221 g, & focal CIN I w/clear margins   ANTERIOR CERVICAL DECOMP/DISCECTOMY FUSION N/A 07/19/2012   Procedure: ANTERIOR CERVICAL DECOMPRESSION/DISCECTOMY FUSION 2 LEVELS;  Surgeon: Manya Sells, MD;  Location: [facility];  Service: Neurosurgery;  Laterality: N/A;  Anterior Cervical Five-Six Cervical Six-Seven Anterior Cervical Decompression/Diskectomy/Fusion   cataracts Bilateral 03/2022   COLONOSCOPY     CYSTOSCOPY  02/23/2011   PT.STATES SHE DID NOT HAVE   dental implants     IR IMAGING GUIDED PORT INSERTION  05/31/2023   LASIK  '99   LUMBAR LAMINECTOMY/DECOMPRESSION MICRODISCECTOMY Right 02/05/2015   Procedure: Right Lumbar four-five Laminectomy with Resection of Synovial Cyst ;  Surgeon: Manya Sells, MD;  Location: [facility];  Service: Neurosurgery;  Laterality: Right;   MOHS SURGERY     x 2   MOLE REMOVAL  2023   REPLACEMENT TOTAL KNEE Right 09/2021   TONSILLECTOMY AND ADENOIDECTOMY  VAGINAL DELIVERY  '92 '94    Social History   Socioeconomic History   Marital status: Divorced    Spouse name: Not on file   Number of children: 2   Years of education: Not on file   Highest education level: Not on file  Occupational History   Occupation: Charity fundraiser    Employer: [employer]  Tobacco Use   Smoking status: Never   Smokeless tobacco: Never  Vaping Use   Vaping status: Never Used  Substance and Sexual Activity   Alcohol use: Yes    Alcohol/week: 10.0 - 12.0 standard drinks of alcohol    Types: 10 - 12 Glasses of wine per week    Comment:  10-12 glasses of wine a week   Drug use: No   Sexual activity: Not Currently    Partners: Male    Birth control/protection: Post-menopausal, Surgical    Comment: R-TLH  Other Topics Concern   Not on file  Social History Narrative   Not on file   Social Drivers of Health   Financial Resource Strain: Not on file  Food Insecurity: No Food Insecurity (05/27/2023)   Hunger Vital Sign    Worried About Running Out of Food in the Last Year: Never true    Ran Out of Food in the Last Year: Never true  Transportation Needs: No Transportation Needs (05/27/2023)   PRAPARE - Administrator, Civil Service (Medical): No    Lack of Transportation (Non-Medical): No  Physical Activity: Not on file  Stress: Not on file  Social Connections: Not on file     FAMILY HISTORY:  We obtained a detailed, 4-generation family history.  Significant diagnoses are listed below: Family History  Problem Relation Age of Onset   Hypertension Mother    Heart disease Mother    Lung cancer Mother    Hypertension Father    Heart attack Father    Colon cancer Neg Hx    Esophageal cancer Neg Hx    Pancreatic cancer Neg Hx    Prostate cancer Neg Hx    Rectal cancer Neg Hx    Stomach cancer Neg Hx    Breast cancer Neg Hx      The patient has two sons who are cancer free. She has a brother and three sisters who are cancer free.  Both parents are deceased.   There is no reported cancer history on the paternal side of the family.  The patient's mother died of lung cancer, and her maternal uncle had an unknown cancer.  There was a maternal cousin with colon cancer.  Debra Nguyen is unaware of previous family history of genetic testing for hereditary cancer risks.  There is no reported Ashkenazi Jewish ancestry. There is no known consanguinity.  GENETIC COUNSELING ASSESSMENT: Debra Nguyen is a 68 y.o. female with a personal and family history of cancer which is somewhat suggestive of a hereditary cancer  syndrome and predisposition to cancer given her diagnosis of ovarian cancer. We, therefore, discussed and recommended the following at today's visit.   DISCUSSION: We discussed that, in general, most cancer is not inherited in families, but instead is sporadic or familial. Sporadic cancers occur by chance and typically happen at older ages (>50 years) as this type of cancer is caused by genetic changes acquired during an individual's lifetime. Some families have more cancers than would be expected by chance; however, the ages or types of cancer are not consistent with a known genetic mutation  or known genetic mutations have been ruled out. This type of familial cancer is thought to be due to a combination of multiple genetic, environmental, hormonal, and lifestyle factors. While this combination of factors likely increases the risk of cancer, the exact source of this risk is not currently identifiable or testable.  We discussed that up to 20% of ovarian cancer is hereditary, with most cases associated with BRCA mutations.  There are other genes that can be associated with hereditary ovarian cancer syndromes.  These include BRIP1, RAD51C, RAD51D and Lynch syndrome genes.  We discussed that testing is beneficial for several reasons including knowing how to follow individuals after completing their treatment, identifying whether potential treatment options such as PARP inhibitors would be beneficial, and understand if other family members could be at risk for cancer and allow them to undergo genetic testing.   We reviewed the characteristics, features and inheritance patterns of hereditary cancer syndromes. We also discussed genetic testing, including the appropriate family members to test, the process of testing, insurance coverage and turn-around-time for results. We discussed the implications of a negative, positive, carrier and/or variant of uncertain significant result. Debra Nguyen  was offered a common  hereditary cancer panel (36+ genes) and an expanded pan-cancer panel (70+ genes). Debra Nguyen was informed of the benefits and limitations of each panel, including that expanded pan-cancer panels contain genes that do not have clear management guidelines at this point in time.  We also discussed that as the number of genes included on a panel increases, the chances of variants of uncertain significance increases. Debra Nguyen decided to pursue genetic testing for the CancerNext-Expanded+RNAinsight gene panel.   The CancerNext-Expanded gene panel offered by [facility] and includes sequencing, rearrangement, and RNA analysis for the following 76 genes: AIP, ALK, APC, ATM, AXIN2, BAP1, BARD1, BMPR1A, BRCA1, BRCA2, BRIP1, CDC73, CDH1, CDK4, CDKN1B, CDKN2A, CEBPA, CHEK2, CTNNA1, DDX41, DICER1, ETV6, FH, FLCN, GATA2, LZTR1, MAX, MBD4, MEN1, MET, MLH1, MSH2, MSH3, MSH6, MUTYH, NF1, NF2, NTHL1, PALB2, PHOX2B, PMS2, POT1, PRKAR1A, PTCH1, PTEN, RAD51C, RAD51D, RB1, RET, RUNX1, SDHA, SDHAF2, SDHB, SDHC, SDHD, SMAD4, SMARCA4, SMARCB1, SMARCE1, STK11, SUFU, TMEM127, TP53, TSC1, TSC2, VHL, and WT1 (sequencing and deletion/duplication); EGFR, HOXB13, KIT, MITF, PDGFRA, POLD1, and POLE (sequencing only); EPCAM and GREM1 (deletion/duplication only).    Based on Debra Nguyen's personal and family history of cancer, she meets medical criteria for genetic testing. Despite that she meets criteria, she may still have an out of pocket cost. We discussed that if her out of pocket cost for testing is over $100, the laboratory will call and confirm whether she wants to proceed with testing.  If the out of pocket cost of testing is less than $100 she will be billed by the genetic testing laboratory.   We discussed that some people do not want to undergo genetic testing due to fear of genetic discrimination.  The Genetic Information Nondiscrimination Act (GINA) was signed into federal law in 2008. GINA prohibits health insurers and most  employers from discriminating against individuals based on genetic information (including the results of genetic tests and family history information). According to GINA, health insurance companies cannot consider genetic information to be a preexisting condition, nor can they use it to make decisions regarding coverage or rates. GINA also makes it illegal for most employers to use genetic information in making decisions about hiring, firing, promotion, or terms of employment. It is important to note that GINA does not offer protections for life insurance, disability insurance,  or long-term care insurance. GINA does not apply to those in the [employer], those who work for companies with less than 15 employees, and new life insurance or long-term disability insurance policies.  Health status due to a cancer diagnosis is not protected under GINA. More information about GINA can be found by visiting [email or web address].  PLAN: After considering the risks, benefits, and limitations, Debra Nguyen provided informed consent to pursue genetic testing and the blood sample was sent to [facility] for analysis of the CancerNext-Expanded+RNAinsight. Results should be available within approximately 2-3 weeks' time, at which point they will be disclosed by telephone to Debra Nguyen, as will any additional recommendations warranted by these results. Debra Nguyen will receive a summary of her genetic counseling visit and a copy of her results once available. This information will also be available in Epic.   Lastly, we encouraged Debra Nguyen to remain in contact with cancer genetics annually so that we can continuously update the family history and inform her of any changes in cancer genetics and testing that may be of benefit for this family.   Debra Nguyen questions were answered to her satisfaction today. Our contact information was provided should additional questions or concerns arise. Thank you for the referral and  allowing us  to share in the care of your patient.   Leslie Jester P. [place], MS, CGC Licensed, Patent attorney [email or web address] phone: [phone]  60 minutes were spent on the date of the encounter in service to the patient including preparation, face-to-face consultation, documentation and care coordination.  The patient was seen alone.  Drs. Johnna Nakai, and/or Gudena were available for questions, if needed..    _______________________________________________________________________ For Office Staff:  Number of people involved in session: 1 Was an Intern/ student involved with case: no

## 2023-06-17 ENCOUNTER — Encounter: Payer: Self-pay | Admitting: Oncology

## 2023-06-18 ENCOUNTER — Encounter: Payer: Self-pay | Admitting: Nurse Practitioner

## 2023-06-20 ENCOUNTER — Other Ambulatory Visit: Payer: Self-pay | Admitting: Oncology

## 2023-06-20 DIAGNOSIS — C579 Malignant neoplasm of female genital organ, unspecified: Secondary | ICD-10-CM

## 2023-06-21 ENCOUNTER — Other Ambulatory Visit: Payer: Self-pay | Admitting: Oncology

## 2023-06-21 NOTE — Progress Notes (Signed)
 Gynecologic Oncology Multi-Disciplinary Disposition Conference Note  Date of the Conference: 06/21/2023  Patient Name: Debra Nguyen  Referring Provider: Dr. Scherrie Curt Primary GYN Oncologist: Dr. 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 Disposition is to 3 cycles of chemotherapy and repeat imaging followed by consideration for internal debulking surgery with assistance of surgical oncology at [facility].   This Multidisciplinary conference took place involving physicians from Gynecologic Oncology, Medical Oncology, Radiation Oncology, Pathology, Radiology along with the Gynecologic Oncology Nurse Practitioner and Gynecologic Oncology Nurse Navigator.  Comprehensive assessment of the patient's malignancy, staging, need for surgery, chemotherapy, radiation therapy, and need for further testing were reviewed. Supportive measures, both inpatient and following discharge were also discussed. The recommended plan of care is documented. Greater than 35 minutes were spent correlating and coordinating this patient's care.

## 2023-06-23 ENCOUNTER — Inpatient Hospital Stay

## 2023-06-23 ENCOUNTER — Encounter: Payer: Self-pay | Admitting: Nurse Practitioner

## 2023-06-23 ENCOUNTER — Encounter: Payer: PPO | Admitting: Obstetrics and Gynecology

## 2023-06-23 ENCOUNTER — Inpatient Hospital Stay: Attending: Nurse Practitioner

## 2023-06-23 ENCOUNTER — Inpatient Hospital Stay (HOSPITAL_BASED_OUTPATIENT_CLINIC_OR_DEPARTMENT_OTHER): Admitting: Nurse Practitioner

## 2023-06-23 VITALS — BP 136/88 | HR 93 | Temp 98.1°F | Resp 18 | Ht 67.9 in | Wt 135.0 lb

## 2023-06-23 DIAGNOSIS — C786 Secondary malignant neoplasm of retroperitoneum and peritoneum: Secondary | ICD-10-CM | POA: Diagnosis not present

## 2023-06-23 DIAGNOSIS — C579 Malignant neoplasm of female genital organ, unspecified: Secondary | ICD-10-CM

## 2023-06-23 DIAGNOSIS — Z5189 Encounter for other specified aftercare: Secondary | ICD-10-CM | POA: Diagnosis not present

## 2023-06-23 DIAGNOSIS — R971 Elevated cancer antigen 125 [CA 125]: Secondary | ICD-10-CM | POA: Insufficient documentation

## 2023-06-23 DIAGNOSIS — Z5111 Encounter for antineoplastic chemotherapy: Secondary | ICD-10-CM | POA: Insufficient documentation

## 2023-06-23 LAB — CBC WITH DIFFERENTIAL (CANCER CENTER ONLY)
Abs Immature Granulocytes: 0.07 10*3/uL (ref 0.00–0.07)
Basophils Absolute: 0.1 10*3/uL (ref 0.0–0.1)
Basophils Relative: 1 %
Eosinophils Absolute: 0.1 10*3/uL (ref 0.0–0.5)
Eosinophils Relative: 1 %
HCT: 31.3 % — ABNORMAL LOW (ref 36.0–46.0)
Hemoglobin: 10.1 g/dL — ABNORMAL LOW (ref 12.0–15.0)
Immature Granulocytes: 1 %
Lymphocytes Relative: 19 %
Lymphs Abs: 1.4 10*3/uL (ref 0.7–4.0)
MCH: 28.5 pg (ref 26.0–34.0)
MCHC: 32.3 g/dL (ref 30.0–36.0)
MCV: 88.2 fL (ref 80.0–100.0)
Monocytes Absolute: 0.5 10*3/uL (ref 0.1–1.0)
Monocytes Relative: 7 %
Neutro Abs: 5.2 10*3/uL (ref 1.7–7.7)
Neutrophils Relative %: 71 %
Platelet Count: 587 10*3/uL — ABNORMAL HIGH (ref 150–400)
RBC: 3.55 MIL/uL — ABNORMAL LOW (ref 3.87–5.11)
RDW: 14.5 % (ref 11.5–15.5)
WBC Count: 7.4 10*3/uL (ref 4.0–10.5)
nRBC: 0 % (ref 0.0–0.2)

## 2023-06-23 LAB — CMP (CANCER CENTER ONLY)
ALT: 31 U/L (ref 0–44)
AST: 22 U/L (ref 15–41)
Albumin: 3.7 g/dL (ref 3.5–5.0)
Alkaline Phosphatase: 238 U/L — ABNORMAL HIGH (ref 38–126)
Anion gap: 10 (ref 5–15)
BUN: 12 mg/dL (ref 8–23)
CO2: 27 mmol/L (ref 22–32)
Calcium: 9.5 mg/dL (ref 8.9–10.3)
Chloride: 101 mmol/L (ref 98–111)
Creatinine: 0.58 mg/dL (ref 0.44–1.00)
GFR, Estimated: >60 mL/min (ref >60)
Glucose, Bld: 110 mg/dL — ABNORMAL HIGH (ref 70–99)
Potassium: 4 mmol/L (ref 3.5–5.1)
Sodium: 138 mmol/L (ref 135–145)
Total Bilirubin: 0.3 mg/dL (ref 0.0–1.2)
Total Protein: 6.9 g/dL (ref 6.5–8.1)

## 2023-06-23 MED ORDER — DIPHENHYDRAMINE HCL 50 MG/ML IJ SOLN
50.0000 mg | Freq: Once | INTRAMUSCULAR | Status: AC
  Administered 2023-06-23: 50 mg via INTRAVENOUS
  Filled 2023-06-23: qty 1

## 2023-06-23 MED ORDER — SODIUM CHLORIDE 0.9% FLUSH
10.0000 mL | INTRAVENOUS | Status: DC | PRN
  Administered 2023-06-23: 10 mL

## 2023-06-23 MED ORDER — SODIUM CHLORIDE 0.9 % IV SOLN
175.0000 mg/m2 | Freq: Once | INTRAVENOUS | Status: AC
  Administered 2023-06-23: 300 mg via INTRAVENOUS
  Filled 2023-06-23: qty 50

## 2023-06-23 MED ORDER — SODIUM CHLORIDE 0.9 % IV SOLN: INTRAVENOUS | Status: DC

## 2023-06-23 MED ORDER — FAMOTIDINE IN NACL 20-0.9 MG/50ML-% IV SOLN
20.0000 mg | Freq: Once | INTRAVENOUS | Status: AC
  Administered 2023-06-23: 20 mg via INTRAVENOUS
  Filled 2023-06-23: qty 50

## 2023-06-23 MED ORDER — APREPITANT 130 MG/18ML IV EMUL
130.0000 mg | Freq: Once | INTRAVENOUS | Status: AC
  Administered 2023-06-23: 130 mg via INTRAVENOUS
  Filled 2023-06-23: qty 18

## 2023-06-23 MED ORDER — DEXAMETHASONE SODIUM PHOSPHATE 10 MG/ML IJ SOLN
10.0000 mg | Freq: Once | INTRAMUSCULAR | Status: AC
  Administered 2023-06-23: 10 mg via INTRAVENOUS
  Filled 2023-06-23: qty 1

## 2023-06-23 MED ORDER — SODIUM CHLORIDE 0.9 % IV SOLN
384.0000 mg | Freq: Once | INTRAVENOUS | Status: AC
  Administered 2023-06-23: 380 mg via INTRAVENOUS
  Filled 2023-06-23: qty 38

## 2023-06-23 MED ORDER — HEPARIN SOD (PORK) LOCK FLUSH 100 UNIT/ML IV SOLN
500.0000 [IU] | Freq: Once | INTRAVENOUS | Status: AC | PRN
  Administered 2023-06-23: 500 [IU]

## 2023-06-23 MED ORDER — PALONOSETRON HCL INJECTION 0.25 MG/5ML
0.2500 mg | Freq: Once | INTRAVENOUS | Status: AC
  Administered 2023-06-23: 0.25 mg via INTRAVENOUS
  Filled 2023-06-23: qty 5

## 2023-06-23 NOTE — Patient Instructions (Signed)
 [facility] - A DEPT OF [facility]  Discharge Instructions: Thank you for choosing [facility] to provide your oncology and hematology care.   If you have a lab appointment with the Cancer Center, please go directly to the Cancer Center and check in at the registration area.   Wear comfortable clothing and clothing appropriate for easy access to any Portacath or PICC line.   We strive to give you quality time with your provider. You may need to reschedule your appointment if you arrive late (15 or more minutes).  Arriving late affects you and other patients whose appointments are after yours.  Also, if you miss three or more appointments without notifying the office, you may be dismissed from the clinic at the provider's discretion.      For prescription refill requests, have your pharmacy contact our office and allow 72 hours for refills to be completed.    Today you received the following chemotherapy and/or immunotherapy agents taxol and carboplatin.      To help prevent nausea and vomiting after your treatment, we encourage you to take your nausea medication as directed.  BELOW ARE SYMPTOMS THAT SHOULD BE REPORTED IMMEDIATELY: *FEVER GREATER THAN 100.4 F (38 C) OR HIGHER *CHILLS OR SWEATING *NAUSEA AND VOMITING THAT IS NOT CONTROLLED WITH YOUR NAUSEA MEDICATION *UNUSUAL SHORTNESS OF BREATH *UNUSUAL BRUISING OR BLEEDING *URINARY PROBLEMS (pain or burning when urinating, or frequent urination) *BOWEL PROBLEMS (unusual diarrhea, constipation, pain near the anus) TENDERNESS IN MOUTH AND THROAT WITH OR WITHOUT PRESENCE OF ULCERS (sore throat, sores in mouth, or a toothache) UNUSUAL RASH, SWELLING OR PAIN  UNUSUAL VAGINAL DISCHARGE OR ITCHING   Items with * indicate a potential emergency and should be followed up as soon as possible or go to the Emergency Department if any problems should occur.  Please show the CHEMOTHERAPY ALERT CARD or  IMMUNOTHERAPY ALERT CARD at check-in to the Emergency Department and triage nurse.  Should you have questions after your visit or need to cancel or reschedule your appointment, please contact [facility] - A DEPT OF [facility]  Dept: [phone]  and follow the prompts.  Office hours are 8:00 a.m. to 4:30 p.m. Monday - Friday. Please note that voicemails left after 4:00 p.m. may not be returned until the following business day.  We are closed weekends and major holidays. You have access to a nurse at all times for urgent questions. Please call the main number to the clinic Dept: [phone] and follow the prompts.   For any non-urgent questions, you may also contact your provider using MyChart. We now offer e-Visits for anyone 57 and older to request care online for non-urgent symptoms. For details visit [email or web address].   Also download the MyChart app! Go to the app store, search "MyChart", open the app, select [place], and log in with your MyChart username and password.  Paclitaxel Injection What is this medication? PACLITAXEL (PAK li TAX el) treats some types of cancer. It works by slowing down the growth of cancer cells. This medicine may be used for other purposes; ask your health care provider or pharmacist if you have questions. COMMON BRAND NAME(S): Onxol, Taxol What should I tell my care team before I take this medication? They need to know if you have any of these conditions: Heart disease Liver disease Low white blood cell levels An unusual or allergic reaction to paclitaxel, other medications, foods, dyes, or  preservatives If you or your partner are pregnant or trying to get pregnant Breast-feeding How should I use this medication? This medication is injected into a vein. It is given by your care team in a hospital or clinic setting. Talk to your care team about the use of this medication in children. While it may be given to children  for selected conditions, precautions do apply. Overdosage: If you think you have taken too much of this medicine contact a poison control center or emergency room at once. NOTE: This medicine is only for you. Do not share this medicine with others. What if I miss a dose? Keep appointments for follow-up doses. It is important not to miss your dose. Call your care team if you are unable to keep an appointment. What may interact with this medication? Do not take this medication with any of the following: Live virus vaccines Other medications may affect the way this medication works. Talk with your care team about all of the medications you take. They may suggest changes to your treatment plan to lower the risk of side effects and to make sure your medications work as intended. This list may not describe all possible interactions. Give your health care provider a list of all the medicines, herbs, non-prescription drugs, or dietary supplements you use. Also tell them if you smoke, drink alcohol, or use illegal drugs. Some items may interact with your medicine. What should I watch for while using this medication? Your condition will be monitored carefully while you are receiving this medication. You may need blood work while taking this medication. This medication may make you feel generally unwell. This is not uncommon as chemotherapy can affect healthy cells as well as cancer cells. Report any side effects. Continue your course of treatment even though you feel ill unless your care team tells you to stop. This medication can cause serious allergic reactions. To reduce the risk, your care team may give you other medications to take before receiving this one. Be sure to follow the directions from your care team. This medication may increase your risk of getting an infection. Call your care team for advice if you get a fever, chills, sore throat, or other symptoms of a cold or flu. Do not treat yourself. Try  to avoid being around people who are sick. This medication may increase your risk to bruise or bleed. Call your care team if you notice any unusual bleeding. Be careful brushing or flossing your teeth or using a toothpick because you may get an infection or bleed more easily. If you have any dental work done, tell your dentist you are receiving this medication. Talk to your care team if you may be pregnant. Serious birth defects can occur if you take this medication during pregnancy. Talk to your care team before breastfeeding. Changes to your treatment plan may be needed. What side effects may I notice from receiving this medication? Side effects that you should report to your care team as soon as possible: Allergic reactions--skin rash, itching, hives, swelling of the face, lips, tongue, or throat Heart rhythm changes--fast or irregular heartbeat, dizziness, feeling faint or lightheaded, chest pain, trouble breathing Increase in blood pressure Infection--fever, chills, cough, sore throat, wounds that don't heal, pain or trouble when passing urine, general feeling of discomfort or being unwell Low blood pressure--dizziness, feeling faint or lightheaded, blurry vision Low red blood cell level--unusual weakness or fatigue, dizziness, headache, trouble breathing Painful swelling, warmth, or redness of the skin,  blisters or sores at the infusion site Pain, tingling, or numbness in the hands or feet Slow heartbeat--dizziness, feeling faint or lightheaded, confusion, trouble breathing, unusual weakness or fatigue Unusual bruising or bleeding Side effects that usually do not require medical attention (report to your care team if they continue or are bothersome): Diarrhea Hair loss Joint pain Loss of appetite Muscle pain Nausea Vomiting This list may not describe all possible side effects. Call your doctor for medical advice about side effects. You may report side effects to FDA at  1-800-FDA-1088. Where should I keep my medication? This medication is given in a hospital or clinic. It will not be stored at home. NOTE: This sheet is a summary. It may not cover all possible information. If you have questions about this medicine, talk to your doctor, pharmacist, or health care provider.  2024 Elsevier/Gold Standard (2021-06-24 00:00:00)  Carboplatin Injection What is this medication? CARBOPLATIN (KAR boe pla tin) treats some types of cancer. It works by slowing down the growth of cancer cells. This medicine may be used for other purposes; ask your health care provider or pharmacist if you have questions. COMMON BRAND NAME(S): Paraplatin What should I tell my care team before I take this medication? They need to know if you have any of these conditions: Blood disorders Hearing problems Kidney disease Recent or ongoing radiation therapy An unusual or allergic reaction to carboplatin, cisplatin, other medications, foods, dyes, or preservatives Pregnant or trying to get pregnant Breast-feeding How should I use this medication? This medication is injected into a vein. It is given by your care team in a hospital or clinic setting. Talk to your care team about the use of this medication in children. Special care may be needed. Overdosage: If you think you have taken too much of this medicine contact a poison control center or emergency room at once. NOTE: This medicine is only for you. Do not share this medicine with others. What if I miss a dose? Keep appointments for follow-up doses. It is important not to miss your dose. Call your care team if you are unable to keep an appointment. What may interact with this medication? Medications for seizures Some antibiotics, such as amikacin, gentamicin, neomycin, streptomycin, tobramycin Vaccines This list may not describe all possible interactions. Give your health care provider a list of all the medicines, herbs,  non-prescription drugs, or dietary supplements you use. Also tell them if you smoke, drink alcohol, or use illegal drugs. Some items may interact with your medicine. What should I watch for while using this medication? Your condition will be monitored carefully while you are receiving this medication. You may need blood work while taking this medication. This medication may make you feel generally unwell. This is not uncommon, as chemotherapy can affect healthy cells as well as cancer cells. Report any side effects. Continue your course of treatment even though you feel ill unless your care team tells you to stop. In some cases, you may be given additional medications to help with side effects. Follow all directions for their use. This medication may increase your risk of getting an infection. Call your care team for advice if you get a fever, chills, sore throat, or other symptoms of a cold or flu. Do not treat yourself. Try to avoid being around people who are sick. Avoid taking medications that contain aspirin, acetaminophen, ibuprofen, naproxen, or ketoprofen unless instructed by your care team. These medications may hide a fever. Be careful brushing or flossing  your teeth or using a toothpick because you may get an infection or bleed more easily. If you have any dental work done, tell your dentist you are receiving this medication. Talk to your care team if you wish to become pregnant or think you might be pregnant. This medication can cause serious birth defects. Talk to your care team about effective forms of contraception. Do not breast-feed while taking this medication. What side effects may I notice from receiving this medication? Side effects that you should report to your care team as soon as possible: Allergic reactions--skin rash, itching, hives, swelling of the face, lips, tongue, or throat Infection--fever, chills, cough, sore throat, wounds that don't heal, pain or trouble when passing  urine, general feeling of discomfort or being unwell Low red blood cell level--unusual weakness or fatigue, dizziness, headache, trouble breathing Pain, tingling, or numbness in the hands or feet, muscle weakness, change in vision, confusion or trouble speaking, loss of balance or coordination, trouble walking, seizures Unusual bruising or bleeding Side effects that usually do not require medical attention (report to your care team if they continue or are bothersome): Hair loss Nausea Unusual weakness or fatigue Vomiting This list may not describe all possible side effects. Call your doctor for medical advice about side effects. You may report side effects to FDA at 1-800-FDA-1088. Where should I keep my medication? This medication is given in a hospital or clinic. It will not be stored at home. NOTE: This sheet is a summary. It may not cover all possible information. If you have questions about this medicine, talk to your doctor, pharmacist, or health care provider.  2024 Elsevier/Gold Standard (2021-05-27 00:00:00)

## 2023-06-23 NOTE — Progress Notes (Signed)
  [facility] OFFICE PROGRESS NOTE   Diagnosis: Gynecologic cancer  INTERVAL HISTORY:   Debra Nguyen returns as scheduled.  She completed cycle 1 carboplatin /Taxol  06/02/2023.  She received white cell growth factor support.  She denies nausea/vomiting.  No mouth sores.  No diarrhea.  Bowels moving regularly with the aid of MiraLAX .  No rash.  No signs of allergic reaction.  Abdomen feels "less bloated and distended".  She notes hair loss.  She is walking a few miles a day.  She has an occasional dry cough.  No shortness of breath.  Good appetite.  Mild bone pain following Udenyca .  Objective:  Vital signs in last 24 hours:  Blood pressure 136/88, pulse 93, temperature 98.1 F (36.7 C), temperature source Temporal, resp. rate 18, height 5' 7.9" (1.725 m), weight 135 lb (61.2 kg), last menstrual period 02/02/2011, SpO2 100%.    HEENT: No thrush or ulcers. Resp: Lungs clear bilaterally. Cardio: Regular rate and rhythm. GI: Abdomen is soft, no significant tenderness.  No hepatosplenomegaly.  Firm fullness at the left lower abdomen, mass is less discrete. Vascular: No leg edema. Skin: No rash. Port-A-Cath without erythema.  Lab Results:  Lab Results  Component Value Date   WBC 7.4 06/23/2023   HGB 10.1 (L) 06/23/2023   HCT 31.3 (L) 06/23/2023   MCV 88.2 06/23/2023   PLT 587 (H) 06/23/2023   NEUTROABS 5.2 06/23/2023    Imaging:  No results found.  Medications: I have reviewed the patient's current medications.  Assessment/Plan: Gynecologic malignancy, unknown primary site CT abdomen/pelvis 05/13/2023-5.0 cm apple core lesion in the upper/mid rectum.  Peritoneal disease/omental caking with multiple cystic metastases noted as well as abdominopelvic lymphadenopathy.  Colonoscopy 05/25/2023-infiltrative partially obstructing large mass in the rectosigmoid colon from 20 to 28 cm from the anal verge.  The mass was circumferential.  Oozing was present.  Biopsy showed poorly  differentiated carcinoma consistent with a mullerian primary, immunohistochemical staining favors high-grade serous carcinoma, CK7, PAX8 positive, weak ER positivity and 70% of cells, p16 positive, p53 mutant Cycle 1 Taxol /carboplatin  06/02/2023, Udenyca  06/11/2023 appointment with Dr. Magdaline Schools 3 cycles of chemotherapy then repeat imaging, discuss if she is a candidate for debulking surgery Cycle 2 Taxol /carboplatin  06/23/2023, Udenyca  Hypertension Long history of persistent high risk HPV and moderate vaginal dysplasia  Disposition: Debra Nguyen appears stable.  She has completed 1 cycle of Taxol /carboplatin .  She tolerated well.  She has had clinical improvement.  We will follow-up on the CA125.  Plan to proceed with cycle 2 today as scheduled.  CBC and chemistry panel reviewed.  Labs adequate to proceed as above.  She will return for follow-up and treatment in 3 weeks.  We are available to see her sooner if needed.  Debra Nguyen ANP/GNP-BC   06/23/2023  8:44 AM

## 2023-06-23 NOTE — Progress Notes (Signed)
 Patient seen by Lonna Cobb NP today  Vitals are within treatment parameters:Yes   Labs are within treatment parameters: Yes   Treatment plan has been signed: Yes   Per physician team, Patient is ready for treatment and there are NO modifications to the treatment plan.

## 2023-06-24 ENCOUNTER — Ambulatory Visit

## 2023-06-24 ENCOUNTER — Encounter: Payer: Self-pay | Admitting: Nurse Practitioner

## 2023-06-24 ENCOUNTER — Telehealth: Payer: Self-pay | Admitting: Oncology

## 2023-06-24 LAB — CA 125: Cancer Antigen (CA) 125: 1412 U/mL — ABNORMAL HIGH (ref 0.0–38.1)

## 2023-06-24 NOTE — Telephone Encounter (Signed)
 Left Message - LMOVM of the next series of treatment schedule. Due to the having all done in same day it will be an early morning for labs, seeing the provider, and with treatment to follow as the length is 5+ hrs.

## 2023-06-25 ENCOUNTER — Other Ambulatory Visit: Payer: Self-pay

## 2023-06-25 ENCOUNTER — Inpatient Hospital Stay (HOSPITAL_BASED_OUTPATIENT_CLINIC_OR_DEPARTMENT_OTHER)

## 2023-06-25 VITALS — BP 148/72 | HR 72 | Temp 98.7°F | Resp 18

## 2023-06-25 DIAGNOSIS — Z5111 Encounter for antineoplastic chemotherapy: Secondary | ICD-10-CM | POA: Diagnosis not present

## 2023-06-25 DIAGNOSIS — C579 Malignant neoplasm of female genital organ, unspecified: Secondary | ICD-10-CM

## 2023-06-25 MED ORDER — PEGFILGRASTIM-CBQV 6 MG/0.6ML ~~LOC~~ SOSY
6.0000 mg | PREFILLED_SYRINGE | Freq: Once | SUBCUTANEOUS | Status: AC
  Administered 2023-06-25: 6 mg via SUBCUTANEOUS
  Filled 2023-06-25: qty 0.6

## 2023-06-25 NOTE — Patient Instructions (Signed)
 [facility] - A DEPT OF [facility]   Discharge Instructions: Thank you for choosing [facility] to provide your oncology and hematology care.   If you have a lab appointment with the Cancer Center, please go directly to the Cancer Center and check in at the registration area.   Wear comfortable clothing and clothing appropriate for easy access to any Portacath or PICC line.   We strive to give you quality time with your provider. You may need to reschedule your appointment if you arrive late (15 or more minutes).  Arriving late affects you and other patients whose appointments are after yours.  Also, if you miss three or more appointments without notifying the office, you may be dismissed from the clinic at the provider's discretion.      For prescription refill requests, have your pharmacy contact our office and allow 72 hours for refills to be completed.    Today you received the following chemotherapy and/or immunotherapy agents UDENYCA       To help prevent nausea and vomiting after your treatment, we encourage you to take your nausea medication as directed.  BELOW ARE SYMPTOMS THAT SHOULD BE REPORTED IMMEDIATELY: *FEVER GREATER THAN 100.4 F (38 C) OR HIGHER *CHILLS OR SWEATING *NAUSEA AND VOMITING THAT IS NOT CONTROLLED WITH YOUR NAUSEA MEDICATION *UNUSUAL SHORTNESS OF BREATH *UNUSUAL BRUISING OR BLEEDING *URINARY PROBLEMS (pain or burning when urinating, or frequent urination) *BOWEL PROBLEMS (unusual diarrhea, constipation, pain near the anus) TENDERNESS IN MOUTH AND THROAT WITH OR WITHOUT PRESENCE OF ULCERS (sore throat, sores in mouth, or a toothache) UNUSUAL RASH, SWELLING OR PAIN  UNUSUAL VAGINAL DISCHARGE OR ITCHING   Items with * indicate a potential emergency and should be followed up as soon as possible or go to the Emergency Department if any problems should occur.  Please show the CHEMOTHERAPY ALERT CARD or IMMUNOTHERAPY  ALERT CARD at check-in to the Emergency Department and triage nurse.  Should you have questions after your visit or need to cancel or reschedule your appointment, please contact [facility]  Dept: [phone]  and follow the prompts.  Office hours are 8:00 a.m. to 4:30 p.m. Monday - Friday. Please note that voicemails left after 4:00 p.m. may not be returned until the following business day.  We are closed weekends and major holidays. You have access to a nurse at all times for urgent questions. Please call the main number to the clinic Dept: [phone] and follow the prompts.   For any non-urgent questions, you may also contact your provider using MyChart. We now offer e-Visits for anyone 31 and older to request care online for non-urgent symptoms. For details visit [email or web address].   Also download the MyChart app! Go to the app store, search "MyChart", open the app, select [place], and log in with your MyChart username and password.  Pegfilgrastim  Injection What is this medication? PEGFILGRASTIM  (PEG fil gra stim) lowers the risk of infection in people who are receiving chemotherapy. It works by Systems analyst make more white blood cells, which protects your body from infection. It may also be used to help people who have been exposed to high doses of radiation. This medicine may be used for other purposes; ask your health care provider or pharmacist if you have questions. COMMON BRAND NAME(S): Fulphila , Fylnetra , Neulasta , Nyvepria , Stimufend , UDENYCA , UDENYCA  ONBODY, Ziextenzo  What should I tell my care team before  I take this medication? They need to know if you have any of these conditions: Kidney disease Latex allergy Ongoing radiation therapy Sickle cell disease Skin reactions to acrylic adhesives (On-Body Injector only) An unusual or allergic reaction to pegfilgrastim , filgrastim, other medications, foods, dyes,  or preservatives Pregnant or trying to get pregnant Breast-feeding How should I use this medication? This medication is for injection under the skin. If you get this medication at home, you will be taught how to prepare and give the pre-filled syringe or how to use the On-body Injector. Refer to the patient Instructions for Use for detailed instructions. Use exactly as directed. Tell your care team immediately if you suspect that the On-body Injector may not have performed as intended or if you suspect the use of the On-body Injector resulted in a missed or partial dose. It is important that you put your used needles and syringes in a special sharps container. Do not put them in a trash can. If you do not have a sharps container, call your pharmacist or care team to get one. Talk to your care team about the use of this medication in children. While this medication may be prescribed for selected conditions, precautions do apply. Overdosage: If you think you have taken too much of this medicine contact a poison control center or emergency room at once. NOTE: This medicine is only for you. Do not share this medicine with others. What if I miss a dose? It is important not to miss your dose. Call your care team if you miss your dose. If you miss a dose due to an On-body Injector failure or leakage, a new dose should be administered as soon as possible using a single prefilled syringe for manual use. What may interact with this medication? Interactions have not been studied. This list may not describe all possible interactions. Give your health care provider a list of all the medicines, herbs, non-prescription drugs, or dietary supplements you use. Also tell them if you smoke, drink alcohol, or use illegal drugs. Some items may interact with your medicine. What should I watch for while using this medication? Your condition will be monitored carefully while you are receiving this medication. You may need  blood work done while you are taking this medication. Talk to your care team about your risk of cancer. You may be more at risk for certain types of cancer if you take this medication. If you are going to need a MRI, CT scan, or other procedure, tell your care team that you are using this medication (On-Body Injector only). What side effects may I notice from receiving this medication? Side effects that you should report to your care team as soon as possible: Allergic reactions--skin rash, itching, hives, swelling of the face, lips, tongue, or throat Capillary leak syndrome--stomach or muscle pain, unusual weakness or fatigue, feeling faint or lightheaded, decrease in the amount of urine, swelling of the ankles, hands, or feet, trouble breathing High white blood cell level--fever, fatigue, trouble breathing, night sweats, change in vision, weight loss Inflammation of the aorta--fever, fatigue, back, chest, or stomach pain, severe headache Kidney injury (glomerulonephritis)--decrease in the amount of urine, red or dark brown urine, foamy or bubbly urine, swelling of the ankles, hands, or feet Shortness of breath or trouble breathing Spleen injury--pain in upper left stomach or shoulder Unusual bruising or bleeding Side effects that usually do not require medical attention (report to your care team if they continue or are bothersome): Bone pain  Pain in the hands or feet This list may not describe all possible side effects. Call your doctor for medical advice about side effects. You may report side effects to FDA at 1-800-FDA-1088. Where should I keep my medication? Keep out of the reach of children. If you are using this medication at home, you will be instructed on how to store it. Throw away any unused medication after the expiration date on the label. NOTE: This sheet is a summary. It may not cover all possible information. If you have questions about this medicine, talk to your doctor,  pharmacist, or health care provider.  2024 Elsevier/Gold Standard (2021-01-03 00:00:00)

## 2023-06-26 ENCOUNTER — Other Ambulatory Visit: Payer: Self-pay

## 2023-07-01 ENCOUNTER — Ambulatory Visit: Payer: Self-pay | Admitting: Gastroenterology

## 2023-07-06 ENCOUNTER — Other Ambulatory Visit: Payer: Self-pay

## 2023-07-06 ENCOUNTER — Other Ambulatory Visit (HOSPITAL_BASED_OUTPATIENT_CLINIC_OR_DEPARTMENT_OTHER): Payer: Self-pay

## 2023-07-06 ENCOUNTER — Telehealth: Payer: Self-pay | Admitting: Oncology

## 2023-07-06 ENCOUNTER — Encounter (INDEPENDENT_AMBULATORY_CARE_PROVIDER_SITE_OTHER): Admitting: Ophthalmology

## 2023-07-06 DIAGNOSIS — H35033 Hypertensive retinopathy, bilateral: Secondary | ICD-10-CM

## 2023-07-06 DIAGNOSIS — H43813 Vitreous degeneration, bilateral: Secondary | ICD-10-CM

## 2023-07-06 DIAGNOSIS — H35411 Lattice degeneration of retina, right eye: Secondary | ICD-10-CM

## 2023-07-06 DIAGNOSIS — I10 Essential (primary) hypertension: Secondary | ICD-10-CM | POA: Diagnosis not present

## 2023-07-06 MED ORDER — HYDROCHLOROTHIAZIDE 12.5 MG PO TABS
12.5000 mg | ORAL_TABLET | Freq: Every morning | ORAL | 1 refills | Status: DC
  Filled 2023-07-06: qty 90, 90d supply, fill #0
  Filled 2023-10-17: qty 90, 90d supply, fill #1

## 2023-07-06 NOTE — Telephone Encounter (Signed)
 Left a message with appointment to see Dr. Orvil Bland to discuss surgery on 08/06/23 at 8:00.  Requested a call back to confirm.

## 2023-07-06 NOTE — Telephone Encounter (Signed)
 Debra Nguyen called back and confirmed the appointment with Dr. Orvil Bland.

## 2023-07-07 ENCOUNTER — Inpatient Hospital Stay: Admitting: Nutrition

## 2023-07-07 ENCOUNTER — Other Ambulatory Visit (HOSPITAL_BASED_OUTPATIENT_CLINIC_OR_DEPARTMENT_OTHER): Payer: Self-pay

## 2023-07-07 ENCOUNTER — Other Ambulatory Visit: Payer: Self-pay

## 2023-07-07 MED ORDER — ROSUVASTATIN CALCIUM 5 MG PO TABS
5.0000 mg | ORAL_TABLET | Freq: Every day | ORAL | 3 refills | Status: AC
  Filled 2023-07-07: qty 90, 90d supply, fill #0
  Filled 2023-10-17: qty 90, 90d supply, fill #1
  Filled 2024-01-15: qty 90, 90d supply, fill #2

## 2023-07-07 NOTE — Progress Notes (Signed)
 69 year old female diagnosed with gynecological cancer receiving carboplatin  and Taxol . She is followed by Dr. Scherrie Curt.  Past Medical History includes HTN, Anxiety, CKD, GERD, Vitamin D  deficiency, and Osteopenia.  Medications include Xanax , MVI, Miralax , Compazine .  Labs include Glucose 110.  Height: 5\' 6" . Weight: 135 pounds UBW: 137 pounds per patient BMI: 21.85.  Patient requested nutrition consult to discuss questions.  Patient denies nausea, vomiting, diarrhea and constipation. She states she has lost a few pounds. She is following a low fiber diet. Her appetite is good. She eats 3 meals daily with a bedtime snack. Likes to drink a couple glasses of wine in the evenings. Drinks one Secondary school teacher daily. Eats yogurt with fruit in the morning. Usually has a tuna sandwich or a shake with lunch. Dinner is usually 4-5 oz chicken or salmon. She has been eating ice cream at bedtime and expressed concern regarding the sugar content. She has questions about her diet and requests clarification.  Nutrition Diagnosis: Food and Nutrition Related Knowledge Deficit related to cancer and associated treatments as evidenced by no prior need for nutrition related information.  Intervention: Encouraged small, frequent meals of low fiber foods. Advance diet gradually with MD approval if tolerated. Continue protein shake but suggested to try at breakfast or break up into 2 servings throughout the day. Answered questions around vitamin and mineral supplements. Education provided about "sugar and cancer". Provided evidenced based nutrition resources for recipes and further information. Continue open communication with provider. Contact information given.  Monitoring, Evaluation, Goals: Tolerate low fiber diet to meet calorie and protein goals for weight maintenance.  Patient will contact RD with questions or concerns.

## 2023-07-09 ENCOUNTER — Other Ambulatory Visit: Payer: Self-pay | Admitting: Oncology

## 2023-07-15 ENCOUNTER — Inpatient Hospital Stay: Admitting: Oncology

## 2023-07-15 ENCOUNTER — Encounter: Payer: Self-pay | Admitting: Genetic Counselor

## 2023-07-15 ENCOUNTER — Other Ambulatory Visit

## 2023-07-15 ENCOUNTER — Inpatient Hospital Stay

## 2023-07-15 ENCOUNTER — Telehealth: Payer: Self-pay | Admitting: Genetic Counselor

## 2023-07-15 ENCOUNTER — Ambulatory Visit: Admitting: Oncology

## 2023-07-15 ENCOUNTER — Encounter (INDEPENDENT_AMBULATORY_CARE_PROVIDER_SITE_OTHER): Payer: PPO | Admitting: Ophthalmology

## 2023-07-15 ENCOUNTER — Ambulatory Visit

## 2023-07-15 ENCOUNTER — Ambulatory Visit: Payer: Self-pay | Admitting: Genetic Counselor

## 2023-07-15 VITALS — BP 156/90 | HR 100 | Temp 97.9°F | Resp 18 | Ht 67.0 in | Wt 134.3 lb

## 2023-07-15 VITALS — BP 118/75 | HR 78 | Temp 98.1°F | Resp 18

## 2023-07-15 DIAGNOSIS — C579 Malignant neoplasm of female genital organ, unspecified: Secondary | ICD-10-CM | POA: Diagnosis not present

## 2023-07-15 DIAGNOSIS — Z1379 Encounter for other screening for genetic and chromosomal anomalies: Secondary | ICD-10-CM

## 2023-07-15 DIAGNOSIS — Z95828 Presence of other vascular implants and grafts: Secondary | ICD-10-CM

## 2023-07-15 DIAGNOSIS — Z5111 Encounter for antineoplastic chemotherapy: Secondary | ICD-10-CM | POA: Diagnosis not present

## 2023-07-15 DIAGNOSIS — C569 Malignant neoplasm of unspecified ovary: Secondary | ICD-10-CM

## 2023-07-15 HISTORY — DX: Encounter for other screening for genetic and chromosomal anomalies: Z13.79

## 2023-07-15 LAB — CBC WITH DIFFERENTIAL (CANCER CENTER ONLY)
Abs Immature Granulocytes: 0.02 10*3/uL (ref 0.00–0.07)
Basophils Absolute: 0.1 10*3/uL (ref 0.0–0.1)
Basophils Relative: 1 %
Eosinophils Absolute: 0.1 10*3/uL (ref 0.0–0.5)
Eosinophils Relative: 2 %
HCT: 34.6 % — ABNORMAL LOW (ref 36.0–46.0)
Hemoglobin: 11.3 g/dL — ABNORMAL LOW (ref 12.0–15.0)
Immature Granulocytes: 0 %
Lymphocytes Relative: 24 %
Lymphs Abs: 1.3 10*3/uL (ref 0.7–4.0)
MCH: 29.4 pg (ref 26.0–34.0)
MCHC: 32.7 g/dL (ref 30.0–36.0)
MCV: 89.9 fL (ref 80.0–100.0)
Monocytes Absolute: 0.6 10*3/uL (ref 0.1–1.0)
Monocytes Relative: 10 %
Neutro Abs: 3.5 10*3/uL (ref 1.7–7.7)
Neutrophils Relative %: 63 %
Platelet Count: 336 10*3/uL (ref 150–400)
RBC: 3.85 MIL/uL — ABNORMAL LOW (ref 3.87–5.11)
RDW: 18.6 % — ABNORMAL HIGH (ref 11.5–15.5)
WBC Count: 5.6 10*3/uL (ref 4.0–10.5)
nRBC: 0 % (ref 0.0–0.2)

## 2023-07-15 LAB — CMP (CANCER CENTER ONLY)
ALT: 25 U/L (ref 0–44)
AST: 23 U/L (ref 15–41)
Albumin: 4.2 g/dL (ref 3.5–5.0)
Alkaline Phosphatase: 126 U/L (ref 38–126)
Anion gap: 11 (ref 5–15)
BUN: 13 mg/dL (ref 8–23)
CO2: 26 mmol/L (ref 22–32)
Calcium: 9.6 mg/dL (ref 8.9–10.3)
Chloride: 103 mmol/L (ref 98–111)
Creatinine: 0.62 mg/dL (ref 0.44–1.00)
GFR, Estimated: >60 mL/min (ref >60)
Glucose, Bld: 95 mg/dL (ref 70–99)
Potassium: 3.7 mmol/L (ref 3.5–5.1)
Sodium: 140 mmol/L (ref 135–145)
Total Bilirubin: 0.5 mg/dL (ref 0.0–1.2)
Total Protein: 7.2 g/dL (ref 6.5–8.1)

## 2023-07-15 MED ORDER — PALONOSETRON HCL INJECTION 0.25 MG/5ML
0.2500 mg | Freq: Once | INTRAVENOUS | Status: AC
  Administered 2023-07-15: 0.25 mg via INTRAVENOUS
  Filled 2023-07-15: qty 5

## 2023-07-15 MED ORDER — SODIUM CHLORIDE 0.9 % IV SOLN
384.0000 mg | Freq: Once | INTRAVENOUS | Status: AC
  Administered 2023-07-15: 380 mg via INTRAVENOUS
  Filled 2023-07-15: qty 38

## 2023-07-15 MED ORDER — SODIUM CHLORIDE 0.9% FLUSH
10.0000 mL | INTRAVENOUS | Status: DC | PRN
  Administered 2023-07-15: 10 mL

## 2023-07-15 MED ORDER — DIPHENHYDRAMINE HCL 50 MG/ML IJ SOLN
50.0000 mg | Freq: Once | INTRAMUSCULAR | Status: AC
  Administered 2023-07-15: 50 mg via INTRAVENOUS
  Filled 2023-07-15: qty 1

## 2023-07-15 MED ORDER — HEPARIN SOD (PORK) LOCK FLUSH 100 UNIT/ML IV SOLN
500.0000 [IU] | Freq: Once | INTRAVENOUS | Status: AC | PRN
  Administered 2023-07-15: 500 [IU]

## 2023-07-15 MED ORDER — APREPITANT 130 MG/18ML IV EMUL
130.0000 mg | Freq: Once | INTRAVENOUS | Status: AC
  Administered 2023-07-15: 130 mg via INTRAVENOUS
  Filled 2023-07-15: qty 18

## 2023-07-15 MED ORDER — FAMOTIDINE IN NACL 20-0.9 MG/50ML-% IV SOLN
20.0000 mg | Freq: Once | INTRAVENOUS | Status: AC
  Administered 2023-07-15: 20 mg via INTRAVENOUS
  Filled 2023-07-15: qty 50

## 2023-07-15 MED ORDER — SODIUM CHLORIDE 0.9 % IV SOLN: INTRAVENOUS | Status: DC

## 2023-07-15 MED ORDER — DEXAMETHASONE SODIUM PHOSPHATE 10 MG/ML IJ SOLN
10.0000 mg | Freq: Once | INTRAMUSCULAR | Status: AC
  Administered 2023-07-15: 10 mg via INTRAVENOUS
  Filled 2023-07-15: qty 1

## 2023-07-15 MED ORDER — SODIUM CHLORIDE 0.9 % IV SOLN
175.0000 mg/m2 | Freq: Once | INTRAVENOUS | Status: AC
  Administered 2023-07-15: 300 mg via INTRAVENOUS
  Filled 2023-07-15: qty 50

## 2023-07-15 MED ORDER — SODIUM CHLORIDE 0.9% FLUSH
10.0000 mL | Freq: Once | INTRAVENOUS | Status: AC
  Administered 2023-07-15: 10 mL via INTRAVENOUS

## 2023-07-15 NOTE — Progress Notes (Signed)
 Patient seen by Dr. Coni Deep today  Vitals are within treatment parameters:Yes OK to treat w/BP 156/90 (very anxious today)  Labs are within treatment parameters: Yes   Treatment plan has been signed: Yes   Per physician team, Patient is ready for treatment and there are NO modifications to the treatment plan.

## 2023-07-15 NOTE — Patient Instructions (Signed)

## 2023-07-15 NOTE — Progress Notes (Signed)
 HPI:  Ms. Villegas was previously seen in the [facility] due to a personal and family history of cancer and concerns regarding a hereditary predisposition to cancer. Please refer to our prior cancer genetics clinic note for more information regarding our discussion, assessment and recommendations, at the time. Ms. Port recent genetic test results were disclosed to her, as were recommendations warranted by these results. These results and recommendations are discussed in more detail below.  CANCER HISTORY:  Oncology History  Gynecologic cancer [facility])  05/27/2023 Initial Diagnosis   Gynecologic cancer (HCC)   05/27/2023 Cancer Staging   Staging form: Ovary, Fallopian Tube, and Primary Peritoneal Carcinoma, AJCC 8th Edition - Clinical: FIGO Stage IV (cTX, cN1, cM1) - Signed by Sumner Ends, MD on 05/27/2023 Stage prefix: Initial diagnosis   06/02/2023 -  Chemotherapy   Patient is on Treatment Plan : OVARIAN Carboplatin  (AUC 6) + Paclitaxel  (175) q21d X 6 Cycles     Ovarian cancer (HCC)  06/16/2023 Initial Diagnosis   Ovarian cancer (HCC)   07/15/2023 Genetic Testing   Negative genetic testing on the CancerNext-Expanded+RNAinsight panel.  The report date is Jul 14, 2023.  The CancerNext-Expanded gene panel offered by [facility] and includes sequencing, rearrangement, and RNA analysis for the following 77 genes: AIP, ALK, APC, ATM, BAP1, BARD1, BMPR1A, BRCA1, BRCA2, BRIP1, CDC73, CDH1, CDK4, CDKN1B, CDKN2A, CEBPA, CHEK2, CTNNA1, DDX41, DICER1, ETV6, FH, FLCN, GATA2, LZTR1, MAX, MBD4, MEN1, MET, MLH1, MSH2, MSH3, MSH6, MUTYH, NF1, NF2, NTHL1, PALB2, PHOX2B, PMS2, POT1, PRKAR1A, PTCH1, PTEN, RAD51C, RAD51D, RB1, RET, RPS20, RUNX1, SDHA, SDHAF2, SDHB, SDHC, SDHD, SMAD4, SMARCA4, SMARCB1, SMARCE1, STK11, SUFU, TMEM127, TP53, TSC1, TSC2, VHL, and WT1 (sequencing and deletion/duplication); AXIN2, CTNNA1, DDX41, EGFR, HOXB13, KIT, MBD4, MITF, MSH3, PDGFRA, POLD1 and POLE  (sequencing only); EPCAM and GREM1 (deletion/duplication only). RNA data is routinely analyzed for use in variant interpretation for all genes.      FAMILY HISTORY:  We obtained a detailed, 4-generation family history.  Significant diagnoses are listed below: Family History  Problem Relation Age of Onset   Hypertension Mother    Heart disease Mother    Lung cancer Mother    Hypertension Father    Heart attack Father    Colon cancer Neg Hx    Esophageal cancer Neg Hx    Pancreatic cancer Neg Hx    Prostate cancer Neg Hx    Rectal cancer Neg Hx    Stomach cancer Neg Hx    Breast cancer Neg Hx        The patient has two sons who are cancer free. She has a brother and three sisters who are cancer free.  Both parents are deceased.    There is no reported cancer history on the paternal side of the family.   The patient's mother died of lung cancer, and her maternal uncle had an unknown cancer.  There was a maternal cousin with colon cancer.   Ms. Beckstrand is unaware of previous family history of genetic testing for hereditary cancer risks.  There is no reported Ashkenazi Jewish ancestry. There is no known consanguinity  GENETIC TEST RESULTS: Genetic testing reported out on Jul 14, 2023 through the CancerNext-Expanded+RNAinsight cancer panel found no pathogenic mutations. The CancerNext-Expanded gene panel offered by [facility] and includes sequencing, rearrangement, and RNA analysis for the following 77 genes: AIP, ALK, APC, ATM, BAP1, BARD1, BMPR1A, BRCA1, BRCA2, BRIP1, CDC73, CDH1, CDK4, CDKN1B, CDKN2A, CEBPA, CHEK2, CTNNA1, DDX41, DICER1, ETV6, FH, FLCN,  GATA2, LZTR1, MAX, MBD4, MEN1, MET, MLH1, MSH2, MSH3, MSH6, MUTYH, NF1, NF2, NTHL1, PALB2, PHOX2B, PMS2, POT1, PRKAR1A, PTCH1, PTEN, RAD51C, RAD51D, RB1, RET, RPS20, RUNX1, SDHA, SDHAF2, SDHB, SDHC, SDHD, SMAD4, SMARCA4, SMARCB1, SMARCE1, STK11, SUFU, TMEM127, TP53, TSC1, TSC2, VHL, and WT1 (sequencing and deletion/duplication); AXIN2,  CTNNA1, DDX41, EGFR, HOXB13, KIT, MBD4, MITF, MSH3, PDGFRA, POLD1 and POLE (sequencing only); EPCAM and GREM1 (deletion/duplication only). RNA data is routinely analyzed for use in variant interpretation for all genes. The test report has been scanned into EPIC and is located under the Molecular Pathology section of the Results Review tab.  A portion of the result report is included below for reference.     We discussed with Ms. Prentiss that because current genetic testing is not perfect, it is possible there may be a gene mutation in one of these genes that current testing cannot detect, but that chance is small.  We also discussed, that there could be another gene that has not yet been discovered, or that we have not yet tested, that is responsible for the cancer diagnoses in the family. It is also possible there is a hereditary cause for the cancer in the family that Ms. Chretien did not inherit and therefore was not identified in her testing.  Therefore, it is important to remain in touch with cancer genetics in the future so that we can continue to offer Ms. Boberg the most up to date genetic testing.   ADDITIONAL GENETIC TESTING: We discussed with Ms. Hemrick that her genetic testing was fairly extensive.  If there are genes identified to increase cancer risk that can be analyzed in the future, we would be happy to discuss and coordinate this testing at that time.    CANCER SCREENING RECOMMENDATIONS: Ms. Bree test result is considered negative (normal).  This means that we have not identified a hereditary cause for her personal and family history of cancer at this time. Most cancers happen by chance and this negative test suggests that her personal and family history of cancer may fall into this category.    Possible reasons for Ms. Belcher's negative genetic test include:  1. There may be a gene mutation in one of these genes that current testing methods cannot detect but that chance is small.  2.  There could be another gene that has not yet been discovered, or that we have not yet tested, that is responsible for the cancer diagnoses in the family.  3.  There may be no hereditary risk for cancer in the family. The cancers in Ms. Oquendo and/or her family may be sporadic/familial or due to other genetic and environmental factors. 4. It is also possible there is a hereditary cause for the cancer in the family that Ms. Okazaki did not inherit.  Therefore, it is recommended she continue to follow the cancer management and screening guidelines provided by her oncology and primary healthcare provider. An individual's cancer risk and medical management are not determined by genetic test results alone. Overall cancer risk assessment incorporates additional factors, including personal medical history, family history, and any available genetic information that may result in a personalized plan for cancer prevention and surveillance  RECOMMENDATIONS FOR FAMILY MEMBERS:   Since she did not inherit a identifiable mutation in a cancer predisposition gene included on this panel, her children could not have inherited a known mutation from her in one of these genes. Individuals in this family might be at some increased risk of developing cancer, over the  general population risk, simply due to the family history of cancer.  We recommended women in this family have a yearly mammogram beginning at age 84, or 79 years younger than the earliest onset of cancer, an annual clinical breast exam, and perform monthly breast self-exams. Women in this family should also have a gynecological exam as recommended by their primary provider. All family members should be referred for colonoscopy starting at age 24, or 103 years younger than the earliest onset of cancer.  FOLLOW-UP: Lastly, we discussed with Ms. Lievanos that cancer genetics is a rapidly advancing field and it is possible that new genetic tests will be appropriate for her  and/or her family members in the future. We encouraged her to remain in contact with cancer genetics on an annual basis so we can update her personal and family histories and let her know of advances in cancer genetics that may benefit this family.   Our contact number was provided. Ms. Mulhearn's questions were answered to her satisfaction, and she knows she is welcome to call us  at anytime with additional questions or concerns.   Marijo Shove, MS, [facility] Licensed, Certified Genetic Counselor [email or web address]

## 2023-07-15 NOTE — Patient Instructions (Addendum)
 [facility] - A DEPT OF [facility]  Discharge Instructions: Thank you for choosing [facility] to provide your oncology and hematology care.   If you have a lab appointment with the Cancer Center, please go directly to the Cancer Center and check in at the registration area.   Wear comfortable clothing and clothing appropriate for easy access to any Portacath or PICC line.   We strive to give you quality time with your provider. You may need to reschedule your appointment if you arrive late (15 or more minutes).  Arriving late affects you and other patients whose appointments are after yours.  Also, if you miss three or more appointments without notifying the office, you may be dismissed from the clinic at the provider's discretion.      For prescription refill requests, have your pharmacy contact our office and allow 72 hours for refills to be completed.    Today you received the following chemotherapy and/or immunotherapy agents: PACLitaxel  (TAXOL ) and CARBOplatin  (PARAPLATIN )      To help prevent nausea and vomiting after your treatment, we encourage you to take your nausea medication as directed.  BELOW ARE SYMPTOMS THAT SHOULD BE REPORTED IMMEDIATELY: *FEVER GREATER THAN 100.4 F (38 C) OR HIGHER *CHILLS OR SWEATING *NAUSEA AND VOMITING THAT IS NOT CONTROLLED WITH YOUR NAUSEA MEDICATION *UNUSUAL SHORTNESS OF BREATH *UNUSUAL BRUISING OR BLEEDING *URINARY PROBLEMS (pain or burning when urinating, or frequent urination) *BOWEL PROBLEMS (unusual diarrhea, constipation, pain near the anus) TENDERNESS IN MOUTH AND THROAT WITH OR WITHOUT PRESENCE OF ULCERS (sore throat, sores in mouth, or a toothache) UNUSUAL RASH, SWELLING OR PAIN  UNUSUAL VAGINAL DISCHARGE OR ITCHING   Items with * indicate a potential emergency and should be followed up as soon as possible or go to the Emergency Department if any problems should occur.  Please show the  CHEMOTHERAPY ALERT CARD or IMMUNOTHERAPY ALERT CARD at check-in to the Emergency Department and triage nurse.  Should you have questions after your visit or need to cancel or reschedule your appointment, please contact [facility] - A DEPT OF [facility]  Dept: [phone]  and follow the prompts.  Office hours are 8:00 a.m. to 4:30 p.m. Monday - Friday. Please note that voicemails left after 4:00 p.m. may not be returned until the following business day.  We are closed weekends and major holidays. You have access to a nurse at all times for urgent questions. Please call the main number to the clinic Dept: [phone] and follow the prompts.   For any non-urgent questions, you may also contact your provider using MyChart. We now offer e-Visits for anyone 36 and older to request care online for non-urgent symptoms. For details visit [email or web address].   Also download the MyChart app! Go to the app store, search "MyChart", open the app, select [facility], and log in with your MyChart username and password.

## 2023-07-15 NOTE — Telephone Encounter (Signed)
Revealed negative genetic testing.  Discussed that we do not know why she has ovarian cancer or why there is cancer in the family. It could be due to a different gene that we are not testing, or maybe our current technology may not be able to pick something up.  It will be important for her to keep in contact with genetics to keep up with whether additional testing may be needed.      

## 2023-07-15 NOTE — Progress Notes (Addendum)
 [facility] OFFICE PROGRESS NOTE   Diagnosis: Gynecologic cancer  INTERVAL HISTORY:   Ms. [name] completed cycle 2 paclitaxel /carboplatin  on 06/23/2023.  She received G-CSF on 06/25/2023.  She reports mild bone pain following G-CSF, relieved with Tylenol .  Abdominal distention has improved.  She is having bowel movements.  She takes MiraLAX  twice daily. No nausea/vomiting, neuropathy symptoms, or symptom of an allergic reaction.  She feels well.  Objective:  Vital signs in last 24 hours:  Blood pressure (!) 158/107, pulse 100, temperature 97.9 F (36.6 C), temperature source Temporal, resp. rate 18, height 5\' 7"  (1.702 m), weight 134 lb 4.8 oz (60.9 kg), last menstrual period 02/02/2011, SpO2 100%.    HEENT: No thrush or ulcers Resp: Lungs clear bilaterally Cardio: Regular rate and rhythm GI: No hepatosplenomegaly, soft, no apparent ascites, firm masslike fullness at at the left lower abdomen with mild associated tenderness Vascular: No leg edema   Portacath/PICC-without erythema  Lab Results:  Lab Results  Component Value Date   WBC 5.6 07/15/2023   HGB 11.3 (L) 07/15/2023   HCT 34.6 (L) 07/15/2023   MCV 89.9 07/15/2023   PLT 336 07/15/2023   NEUTROABS 3.5 07/15/2023    CMP  Lab Results  Component Value Date   NA 140 07/15/2023   K 3.7 07/15/2023   CL 103 07/15/2023   CO2 26 07/15/2023   GLUCOSE 95 07/15/2023   BUN 13 07/15/2023   CREATININE 0.62 07/15/2023   CALCIUM  9.6 07/15/2023   PROT 7.2 07/15/2023   ALBUMIN 4.2 07/15/2023   AST 23 07/15/2023   ALT 25 07/15/2023   ALKPHOS 126 07/15/2023   BILITOT 0.5 07/15/2023   GFRNONAA >60 07/15/2023   GFRAA >60 02/01/2015    Lab Results  Component Value Date   CEA 5.86 (H) 05/27/2023    No results found for: "INR", "LABPROT"  Imaging:  No results found.  Medications: I have reviewed the patient's current medications.   Assessment/Plan: Gynecologic malignancy, unknown primary site CT  abdomen/pelvis 05/13/2023-5.0 cm apple core lesion in the upper/mid rectum.  Peritoneal disease/omental caking with multiple cystic metastases noted as well as abdominopelvic lymphadenopathy.  Colonoscopy 05/25/2023-infiltrative partially obstructing large mass in the rectosigmoid colon from 20 to 28 cm from the anal verge.  The mass was circumferential.  Oozing was present.  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  She has tolerated the chemotherapy well.  Her clinical status has improved.  The CA125 was lower on 06/23/2023.  We will follow-up on the CA125 from today.  She will complete cycle 3 chemotherapy today.  She will undergo a restaging CT evaluation on 08/02/2023 and return for an office visit on 08/03/2023.  I will coordinate the subsequent treatment plan with Dr. Orvil Bland.  We will be sure the colon biopsy tissue has been submitted for molecular testing  and she will be referred for genetic testing.  Coni Deep, MD  07/15/2023  8:53 AM

## 2023-07-16 ENCOUNTER — Encounter: Payer: Self-pay | Admitting: Emergency Medicine

## 2023-07-16 LAB — CA 125: Cancer Antigen (CA) 125: 420 U/mL — ABNORMAL HIGH (ref 0.0–38.1)

## 2023-07-17 ENCOUNTER — Inpatient Hospital Stay

## 2023-07-17 VITALS — BP 112/67 | HR 73 | Temp 98.7°F | Resp 15

## 2023-07-17 DIAGNOSIS — Z5111 Encounter for antineoplastic chemotherapy: Secondary | ICD-10-CM | POA: Diagnosis not present

## 2023-07-17 DIAGNOSIS — C579 Malignant neoplasm of female genital organ, unspecified: Secondary | ICD-10-CM

## 2023-07-17 MED ORDER — PEGFILGRASTIM-CBQV 6 MG/0.6ML ~~LOC~~ SOSY
6.0000 mg | PREFILLED_SYRINGE | Freq: Once | SUBCUTANEOUS | Status: AC
  Administered 2023-07-17: 6 mg via SUBCUTANEOUS

## 2023-08-01 ENCOUNTER — Other Ambulatory Visit: Payer: Self-pay | Admitting: Oncology

## 2023-08-02 ENCOUNTER — Ambulatory Visit (HOSPITAL_BASED_OUTPATIENT_CLINIC_OR_DEPARTMENT_OTHER)
Admission: RE | Admit: 2023-08-02 | Discharge: 2023-08-02 | Disposition: A | Discharge status: Home / Self Care | Source: Ambulatory Visit | Attending: Oncology | Admitting: Oncology

## 2023-08-02 DIAGNOSIS — C786 Secondary malignant neoplasm of retroperitoneum and peritoneum: Secondary | ICD-10-CM | POA: Diagnosis not present

## 2023-08-02 DIAGNOSIS — N200 Calculus of kidney: Secondary | ICD-10-CM | POA: Insufficient documentation

## 2023-08-02 DIAGNOSIS — I2510 Atherosclerotic heart disease of native coronary artery without angina pectoris: Secondary | ICD-10-CM | POA: Diagnosis not present

## 2023-08-02 DIAGNOSIS — C569 Malignant neoplasm of unspecified ovary: Secondary | ICD-10-CM | POA: Diagnosis not present

## 2023-08-02 DIAGNOSIS — C771 Secondary and unspecified malignant neoplasm of intrathoracic lymph nodes: Secondary | ICD-10-CM | POA: Diagnosis not present

## 2023-08-02 DIAGNOSIS — C778 Secondary and unspecified malignant neoplasm of lymph nodes of multiple regions: Secondary | ICD-10-CM | POA: Diagnosis not present

## 2023-08-02 DIAGNOSIS — I700 Atherosclerosis of aorta: Secondary | ICD-10-CM | POA: Diagnosis not present

## 2023-08-02 MED ORDER — IOHEXOL 300 MG/ML  SOLN
100.0000 mL | Freq: Once | INTRAMUSCULAR | Status: AC | PRN
  Administered 2023-08-02: 100 mL via INTRAVENOUS

## 2023-08-03 ENCOUNTER — Telehealth: Payer: Self-pay | Admitting: Oncology

## 2023-08-03 NOTE — Telephone Encounter (Signed)
 Left a message regarding surgery plan.  Requested a return call.

## 2023-08-03 NOTE — Telephone Encounter (Signed)
 Called Debra Nguyen and let her know that Dr. Orvil Bland has reviewed her CT scan and is recommending 1 more cycle of chemotherapy before surgery.  Advised Debra Nguyen to keep her appointments tomorrow  to see Diana Forster, NP and Dr. Scherrie Curt followed by chemotherapy.  She verbalized understanding and agreement of the plan.

## 2023-08-04 ENCOUNTER — Inpatient Hospital Stay: Attending: Nurse Practitioner

## 2023-08-04 ENCOUNTER — Inpatient Hospital Stay

## 2023-08-04 ENCOUNTER — Inpatient Hospital Stay: Admitting: Nurse Practitioner

## 2023-08-04 ENCOUNTER — Encounter: Payer: Self-pay | Admitting: Nurse Practitioner

## 2023-08-04 VITALS — BP 147/90 | HR 100 | Temp 97.7°F | Resp 18 | Ht 67.0 in | Wt 134.7 lb

## 2023-08-04 DIAGNOSIS — C778 Secondary and unspecified malignant neoplasm of lymph nodes of multiple regions: Secondary | ICD-10-CM | POA: Diagnosis not present

## 2023-08-04 DIAGNOSIS — Z5111 Encounter for antineoplastic chemotherapy: Secondary | ICD-10-CM | POA: Insufficient documentation

## 2023-08-04 DIAGNOSIS — R971 Elevated cancer antigen 125 [CA 125]: Secondary | ICD-10-CM | POA: Diagnosis not present

## 2023-08-04 DIAGNOSIS — Z5189 Encounter for other specified aftercare: Secondary | ICD-10-CM | POA: Diagnosis not present

## 2023-08-04 DIAGNOSIS — M898X9 Other specified disorders of bone, unspecified site: Secondary | ICD-10-CM | POA: Diagnosis not present

## 2023-08-04 DIAGNOSIS — C579 Malignant neoplasm of female genital organ, unspecified: Secondary | ICD-10-CM

## 2023-08-04 DIAGNOSIS — C786 Secondary malignant neoplasm of retroperitoneum and peritoneum: Secondary | ICD-10-CM | POA: Diagnosis not present

## 2023-08-04 DIAGNOSIS — G620 Drug-induced polyneuropathy: Secondary | ICD-10-CM | POA: Diagnosis not present

## 2023-08-04 LAB — CMP (CANCER CENTER ONLY)
ALT: 33 U/L (ref 0–44)
AST: 28 U/L (ref 15–41)
Albumin: 4.1 g/dL (ref 3.5–5.0)
Alkaline Phosphatase: 154 U/L — ABNORMAL HIGH (ref 38–126)
Anion gap: 12 (ref 5–15)
BUN: 12 mg/dL (ref 8–23)
CO2: 25 mmol/L (ref 22–32)
Calcium: 9.3 mg/dL (ref 8.9–10.3)
Chloride: 103 mmol/L (ref 98–111)
Creatinine: 0.69 mg/dL (ref 0.44–1.00)
GFR, Estimated: >60 mL/min (ref >60)
Glucose, Bld: 114 mg/dL — ABNORMAL HIGH (ref 70–99)
Potassium: 3.7 mmol/L (ref 3.5–5.1)
Sodium: 141 mmol/L (ref 135–145)
Total Bilirubin: 0.6 mg/dL (ref 0.0–1.2)
Total Protein: 6.9 g/dL (ref 6.5–8.1)

## 2023-08-04 LAB — CBC WITH DIFFERENTIAL (CANCER CENTER ONLY)
Abs Immature Granulocytes: 0.02 10*3/uL (ref 0.00–0.07)
Basophils Absolute: 0.1 10*3/uL (ref 0.0–0.1)
Basophils Relative: 1 %
Eosinophils Absolute: 0.1 10*3/uL (ref 0.0–0.5)
Eosinophils Relative: 1 %
HCT: 34.9 % — ABNORMAL LOW (ref 36.0–46.0)
Hemoglobin: 11.5 g/dL — ABNORMAL LOW (ref 12.0–15.0)
Immature Granulocytes: 0 %
Lymphocytes Relative: 22 %
Lymphs Abs: 1.6 10*3/uL (ref 0.7–4.0)
MCH: 29.9 pg (ref 26.0–34.0)
MCHC: 33 g/dL (ref 30.0–36.0)
MCV: 90.6 fL (ref 80.0–100.0)
Monocytes Absolute: 0.5 10*3/uL (ref 0.1–1.0)
Monocytes Relative: 7 %
Neutro Abs: 5 10*3/uL (ref 1.7–7.7)
Neutrophils Relative %: 69 %
Platelet Count: 350 10*3/uL (ref 150–400)
RBC: 3.85 MIL/uL — ABNORMAL LOW (ref 3.87–5.11)
RDW: 19.9 % — ABNORMAL HIGH (ref 11.5–15.5)
WBC Count: 7.3 10*3/uL (ref 4.0–10.5)
nRBC: 0 % (ref 0.0–0.2)

## 2023-08-04 MED ORDER — SODIUM CHLORIDE 0.9 % IV SOLN
380.5000 mg | Freq: Once | INTRAVENOUS | Status: AC
  Administered 2023-08-04: 380 mg via INTRAVENOUS
  Filled 2023-08-04: qty 38

## 2023-08-04 MED ORDER — SODIUM CHLORIDE 0.9 % IV SOLN
INTRAVENOUS | Status: DC
Start: 2023-08-04 — End: 2023-08-04

## 2023-08-04 MED ORDER — DEXAMETHASONE SODIUM PHOSPHATE 10 MG/ML IJ SOLN
10.0000 mg | Freq: Once | INTRAMUSCULAR | Status: AC
  Administered 2023-08-04: 10 mg via INTRAVENOUS
  Filled 2023-08-04: qty 1

## 2023-08-04 MED ORDER — HEPARIN SOD (PORK) LOCK FLUSH 100 UNIT/ML IV SOLN
500.0000 [IU] | Freq: Once | INTRAVENOUS | Status: AC | PRN
  Administered 2023-08-04: 500 [IU]

## 2023-08-04 MED ORDER — SODIUM CHLORIDE 0.9% FLUSH
10.0000 mL | INTRAVENOUS | Status: DC | PRN
  Administered 2023-08-04: 10 mL

## 2023-08-04 MED ORDER — PALONOSETRON HCL INJECTION 0.25 MG/5ML
0.2500 mg | Freq: Once | INTRAVENOUS | Status: AC
  Administered 2023-08-04: 0.25 mg via INTRAVENOUS
  Filled 2023-08-04: qty 5

## 2023-08-04 MED ORDER — FAMOTIDINE IN NACL 20-0.9 MG/50ML-% IV SOLN
20.0000 mg | Freq: Once | INTRAVENOUS | Status: AC
  Administered 2023-08-04: 20 mg via INTRAVENOUS
  Filled 2023-08-04: qty 50

## 2023-08-04 MED ORDER — DIPHENHYDRAMINE HCL 50 MG/ML IJ SOLN
50.0000 mg | Freq: Once | INTRAMUSCULAR | Status: AC
  Administered 2023-08-04: 50 mg via INTRAVENOUS
  Filled 2023-08-04: qty 1

## 2023-08-04 MED ORDER — APREPITANT 130 MG/18ML IV EMUL
130.0000 mg | Freq: Once | INTRAVENOUS | Status: AC
  Administered 2023-08-04: 130 mg via INTRAVENOUS
  Filled 2023-08-04: qty 18

## 2023-08-04 MED ORDER — SODIUM CHLORIDE 0.9 % IV SOLN
175.0000 mg/m2 | Freq: Once | INTRAVENOUS | Status: AC
  Administered 2023-08-04: 300 mg via INTRAVENOUS
  Filled 2023-08-04: qty 50

## 2023-08-04 NOTE — Progress Notes (Signed)
 [facility] OFFICE PROGRESS NOTE   Diagnosis: Gynecologic cancer  INTERVAL HISTORY:   Debra Nguyen returns as scheduled.  She completed cycle 3 paclitaxel /carboplatin  07/15/2023.  She denies nausea/vomiting.  No mouth sores.  Bowels moving with the aid of MiraLAX .  No rash.  For the past several days tingling in the fingertips and feet.  No numbness or associated pain.  No skin rash.  No abdominal pain.  She noted increased bone pain following the most recent treatment/Udenyca  injection.  The pain was relieved with extra strength Tylenol .  Objective:  Vital signs in last 24 hours:  Blood pressure (!) 147/90, pulse 100, temperature 97.7 F (36.5 C), temperature source Temporal, resp. rate 18, height 5' 7 (1.702 m), weight 134 lb 11.2 oz (61.1 kg), last menstrual period 02/02/2011, SpO2 100%.    HEENT: No thrush or ulcers. Resp: Lungs clear bilaterally. Cardio: Regular rate and rhythm. GI: No hepatosplenomegaly.  Nontender.  No apparent ascites.  Firm fullness left lower abdomen. Vascular: No leg edema. Neuro: Vibratory sense mildly decreased over the fingertips per tuning fork exam. Skin: No rash. Port-A-Cath without erythema.  Lab Results:  Lab Results  Component Value Date   WBC 7.3 08/04/2023   HGB 11.5 (L) 08/04/2023   HCT 34.9 (L) 08/04/2023   MCV 90.6 08/04/2023   PLT 350 08/04/2023   NEUTROABS 5.0 08/04/2023    Imaging:  CT CHEST ABDOMEN PELVIS W CONTRAST Result Date: 08/02/2023 CLINICAL DATA:  Restaging of ovarian cancer. Prior hysterectomy. * Tracking Code: BO * EXAM: CT CHEST, ABDOMEN, AND PELVIS WITH CONTRAST TECHNIQUE: Multidetector CT imaging of the chest, abdomen and pelvis was performed following the standard protocol during bolus administration of intravenous contrast. RADIATION DOSE REDUCTION: This exam was performed according to the departmental dose-optimization program which includes automated exposure control, adjustment of the mA and/or kV  according to patient size and/or use of iterative reconstruction technique. CONTRAST:  OMNIPAQUE  IOHEXOL  300 MG/ML  SOLN COMPARISON:  05/28/2023 chest CT.  05/13/2023 abdominopelvic CT. FINDINGS: CT CHEST FINDINGS Cardiovascular: Port-A-Cath tip high right atrium. Aortic atherosclerosis. Tortuous thoracic aorta. Normal heart size, without pericardial effusion. Lad coronary artery calcification. No central pulmonary embolism, on this non-dedicated study. Mediastinum/Nodes: Resolved left supraclavicular adenopathy with only small nodes remaining. No axillary adenopathy. High left mediastinal 7 mm node on 16/2 measures 11 mm on the prior exam (when remeasured). No hilar adenopathy. Prevascular adenopathy is resolved. A partially necrotic right cardiophrenic angle node measures 11 mm on 52/2 versus 2.3 cm on the prior. Lungs/Pleura: No pleural fluid. No suspicious pulmonary nodule or mass. Left upper and left lower lobe tiny calcified granulomas. Musculoskeletal: Included within the abdomen pelvic section. CT ABDOMEN PELVIS FINDINGS Hepatobiliary: Normal liver. Normal gallbladder, without biliary ductal dilatation. Pancreas: Normal, without mass or ductal dilatation. Spleen: Normal in size, without focal abnormality. Adrenals/Urinary Tract: Normal adrenal glands. 3 mm lower pole left renal collecting system calculus. Normal right kidney. A too small to characterize upper pole left renal lesion is most likely a cyst and does not warrant imaging follow-up. Development of left-sided mild hydroureteronephrosis followed to the level of the cystic left pelvic mass detailed below. Normal urinary bladder. Stomach/Bowel: Normal stomach, without wall thickening. Colonic stool burden suggests constipation. No colonic obstruction. Normal small bowel caliber. Vascular/Lymphatic: Aortic atherosclerosis. Abdominal retroperitoneal adenopathy is resolved. Example 7 mm pre aortic/precaval node on 79/2 measured 1.6 cm previously.  Resolved pelvic adenopathy with index 5 mm right external iliac node on 107/2 measuring 11  mm on the prior. Reproductive: Hysterectomy. Cystic left pelvic mass is presumably ovarian. Example 13.5 x 9.5 cm on 102/2 versus 14.4 x 9.3 cm on the prior. Other: Improved omental/peritoneal metastasis. Example left abdominal omental 4.1 cm cystic lesion on 93/2 versus 7.8 cm on the prior. Right perirectal cystic lesion of 5.7 cm on 117/2 (7.6 cm previously). Mild pelvic floor laxity.  No free intraperitoneal air. Musculoskeletal: Fat containing left paramidline ventral abdominal wall hernia is tiny. Cervical spine fixation. Right-sided L3 sclerotic lesion is similar, likely a bone island. IMPRESSION: 1. Moderate to marked response to therapy of low cervical, thoracic, abdominal, and pelvic nodal metastasis. 2. Moderate response to therapy of abdominopelvic omental/peritoneal metastasis. 3. Similar to minimal decrease in size of a dominant left pelvic mass. New mild left-sided hydroureteronephrosis which is likely secondary to this dominant mass. 4. No bowel obstruction or other complication. 5. Incidental findings, including: Coronary artery atherosclerosis. Aortic Atherosclerosis (ICD10-I70.0). Possible constipation. Left nephrolithiasis. Electronically Signed   By: Lore Rode M.D.   On: 08/02/2023 16:20    Medications: I have reviewed the patient's current medications.  Assessment/Plan: Gynecologic malignancy, unknown primary site CT abdomen/pelvis 05/13/2023-5.0 cm apple core lesion in the upper/mid rectum.  Peritoneal disease/omental caking with multiple cystic metastases noted as well as abdominopelvic lymphadenopathy.  Colonoscopy 05/25/2023-infiltrative partially obstructing large mass in the rectosigmoid colon from 20 to 28 cm from the anal verge.  The mass was circumferential.  Oozing was present.  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 Magdaline Schools 3 cycles of chemotherapy then repeat imaging, discuss if she is a candidate for debulking surgery Cycle 2 Taxol /carboplatin  06/23/2023, Udenyca  Cycle 3 Taxol /carboplatin  07/15/2023, Udenyca  CTs 08/02/2023-moderate to marked response to therapy of lower cervical, thoracic, abdominal and pelvic nodal metastasis.  Moderate response of abdominopelvic omental/peritoneal metastasis.  Similar to minimal decrease in size of a dominant left pelvic mass.  New mild left-sided hydroureteronephrosis likely secondary to the dominant mass. 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  She has completed 3 cycles of paclitaxel /carboplatin .  She continues to tolerate treatment well.  Restaging CTs appear improved.  Results/images reviewed with her at today's visit.  We have communicated with Dr. Orvil Bland.  Recommendation is to proceed with cycle 4 today as scheduled.  She has an appointment with Dr. Orvil Bland later this week to discuss surgery.  CBC and chemistry panel reviewed.  Labs adequate to proceed as above.  The bone pain could be related to the Udenyca  or paclitaxel .  She will take Tylenol  as needed.  She will let us  know if she needs something stronger.  She will return for follow-up and possible treatment in 3 weeks.  We are available to see her sooner if needed.  Patient seen with Dr. Scherrie Curt.    Debra Nguyen  ANP/GNP-BC   08/04/2023  10:39 AM  This was a shared visit with Debra Nguyen.  Debra Nguyen has completed 3 cycles of paclitaxel /carboplatin .  She has tolerated the treatment well.  We reviewed the restaging CT findings and images with her.  I have communicated with Dr. Orvil Bland. The CTs are consistent with a significant clinical response to chemotherapy.  She will complete cycle 4 chemotherapy today.  She will see Dr. Orvil Bland later this week to consider cytoreductive surgery.  She understands removal of remaining tumor from the abdomen/pelvis will most likely not be curative, but may improve the treatment free interval and avoid future morbidity.  I was present for greater than 50% of today's visit.  I performed medical decision making.  Anise Kerns, MD

## 2023-08-04 NOTE — Progress Notes (Signed)
 Patient seen by Lonna Cobb NP today  Vitals are within treatment parameters:Yes   Labs are within treatment parameters: Yes   Treatment plan has been signed: Yes   Per physician team, Patient is ready for treatment and there are NO modifications to the treatment plan.

## 2023-08-04 NOTE — Patient Instructions (Signed)
 [facility] - A DEPT OF [facility]  Discharge Instructions: Thank you for choosing [facility] to provide your oncology and hematology care.   If you have a lab appointment with the Cancer Center, please go directly to the Cancer Center and check in at the registration area.   Wear comfortable clothing and clothing appropriate for easy access to any Portacath or PICC line.   We strive to give you quality time with your provider. You may need to reschedule your appointment if you arrive late (15 or more minutes).  Arriving late affects you and other patients whose appointments are after yours.  Also, if you miss three or more appointments without notifying the office, you may be dismissed from the clinic at the provider's discretion.      For prescription refill requests, have your pharmacy contact our office and allow 72 hours for refills to be completed.    Today you received the following chemotherapy and/or immunotherapy agents: PACLitaxel  (TAXOL ) and CARBOplatin  (PARAPLATIN )      To help prevent nausea and vomiting after your treatment, we encourage you to take your nausea medication as directed.  BELOW ARE SYMPTOMS THAT SHOULD BE REPORTED IMMEDIATELY: *FEVER GREATER THAN 100.4 F (38 C) OR HIGHER *CHILLS OR SWEATING *NAUSEA AND VOMITING THAT IS NOT CONTROLLED WITH YOUR NAUSEA MEDICATION *UNUSUAL SHORTNESS OF BREATH *UNUSUAL BRUISING OR BLEEDING *URINARY PROBLEMS (pain or burning when urinating, or frequent urination) *BOWEL PROBLEMS (unusual diarrhea, constipation, pain near the anus) TENDERNESS IN MOUTH AND THROAT WITH OR WITHOUT PRESENCE OF ULCERS (sore throat, sores in mouth, or a toothache) UNUSUAL RASH, SWELLING OR PAIN  UNUSUAL VAGINAL DISCHARGE OR ITCHING   Items with * indicate a potential emergency and should be followed up as soon as possible or go to the Emergency Department if any problems should occur.  Please show the  CHEMOTHERAPY ALERT CARD or IMMUNOTHERAPY ALERT CARD at check-in to the Emergency Department and triage nurse.  Should you have questions after your visit or need to cancel or reschedule your appointment, please contact [facility] - A DEPT OF [facility]  Dept: [phone]  and follow the prompts.  Office hours are 8:00 a.m. to 4:30 p.m. Monday - Friday. Please note that voicemails left after 4:00 p.m. may not be returned until the following business day.  We are closed weekends and major holidays. You have access to a nurse at all times for urgent questions. Please call the main number to the clinic Dept: [phone] and follow the prompts.   For any non-urgent questions, you may also contact your provider using MyChart. We now offer e-Visits for anyone 36 and older to request care online for non-urgent symptoms. For details visit [email or web address].   Also download the MyChart app! Go to the app store, search "MyChart", open the app, select [facility], and log in with your MyChart username and password.

## 2023-08-05 ENCOUNTER — Encounter: Payer: Self-pay | Admitting: Oncology

## 2023-08-05 LAB — CA 125: Cancer Antigen (CA) 125: 109 U/mL — ABNORMAL HIGH (ref 0.0–38.1)

## 2023-08-06 ENCOUNTER — Encounter: Payer: Self-pay | Admitting: Oncology

## 2023-08-06 ENCOUNTER — Encounter: Payer: Self-pay | Admitting: Gynecologic Oncology

## 2023-08-06 ENCOUNTER — Inpatient Hospital Stay

## 2023-08-06 ENCOUNTER — Inpatient Hospital Stay: Admitting: Gynecologic Oncology

## 2023-08-06 ENCOUNTER — Other Ambulatory Visit (HOSPITAL_BASED_OUTPATIENT_CLINIC_OR_DEPARTMENT_OTHER): Payer: Self-pay

## 2023-08-06 VITALS — BP 125/79 | HR 79 | Temp 98.7°F | Resp 16 | Ht 67.0 in | Wt 135.0 lb

## 2023-08-06 VITALS — BP 135/75 | HR 75 | Temp 98.0°F | Resp 18

## 2023-08-06 DIAGNOSIS — Z7189 Other specified counseling: Secondary | ICD-10-CM | POA: Diagnosis not present

## 2023-08-06 DIAGNOSIS — C579 Malignant neoplasm of female genital organ, unspecified: Secondary | ICD-10-CM

## 2023-08-06 DIAGNOSIS — R971 Elevated cancer antigen 125 [CA 125]: Secondary | ICD-10-CM | POA: Diagnosis not present

## 2023-08-06 DIAGNOSIS — R978 Other abnormal tumor markers: Secondary | ICD-10-CM

## 2023-08-06 DIAGNOSIS — Z5111 Encounter for antineoplastic chemotherapy: Secondary | ICD-10-CM | POA: Diagnosis not present

## 2023-08-06 MED ORDER — METRONIDAZOLE 500 MG PO TABS
ORAL_TABLET | ORAL | 0 refills | Status: DC
  Filled 2023-08-06: qty 6, 1d supply, fill #0

## 2023-08-06 MED ORDER — BISACODYL 5 MG PO TBEC
DELAYED_RELEASE_TABLET | ORAL | 0 refills | Status: DC
  Filled 2023-08-06: qty 2, 1d supply, fill #0

## 2023-08-06 MED ORDER — TRAMADOL HCL 50 MG PO TABS
50.0000 mg | ORAL_TABLET | Freq: Four times a day (QID) | ORAL | 0 refills | Status: DC | PRN
  Filled 2023-08-06: qty 10, 3d supply, fill #0

## 2023-08-06 MED ORDER — PEGFILGRASTIM-CBQV 6 MG/0.6ML ~~LOC~~ SOSY
6.0000 mg | PREFILLED_SYRINGE | Freq: Once | SUBCUTANEOUS | Status: AC
  Administered 2023-08-06: 6 mg via SUBCUTANEOUS
  Filled 2023-08-06: qty 0.6

## 2023-08-06 MED ORDER — POLYETHYLENE GLYCOL 3350 17 GM/SCOOP PO POWD
ORAL | 0 refills | Status: DC
  Filled 2023-08-06: qty 238, 1d supply, fill #0

## 2023-08-06 NOTE — Addendum Note (Signed)
 Addended by: Vira Grieves D on: 08/06/2023 04:44 PM   Modules accepted: Orders

## 2023-08-06 NOTE — Patient Instructions (Addendum)
 Preparing for your Surgery   Plan for surgery on September 01, 2023 with Dr. Wiley Hanger at [facility]. You will be scheduled for diagnostic laparoscopy (looking into the abdomen with a camera through a small incision), robotic assisted laparoscopic versus open bilateral salpingo-oophorectomy (removal of ovaries and fallopian tubes), omentectomy, debulking, possible bowel surgery including possible ostomy.    We also have you meet with the ostomy nurse before surgery to have a marking placed on your abdomen in case an ostomy is performed at the time of surgery.   Pre-operative Testing -You will receive a phone call from presurgical testing at [facility] to arrange for a pre-operative appointment and lab work.   -Bring your insurance card, copy of an advanced directive if applicable, medication list   -At that visit, you will be asked to sign a consent for a possible blood transfusion in case a transfusion becomes necessary during surgery.  The need for a blood transfusion is rare but having consent is a necessary part of your care.      -You should not be taking blood thinners or aspirin  at least ten days prior to surgery unless instructed by your surgeon.   -Do not take supplements such as fish oil (omega 3), red yeast rice, turmeric before your surgery. STOP TAKING AT LEAST 10 DAYS BEFORE SURGERY. You want to avoid medications with aspirin  in them including headache powders such as BC or Goody's), Excedrin migraine.   Day Before Surgery at Home -You have a BOWEL PREP the day before surgery. You will be advised you can have clear liquids up until 3 hours before your surgery.     AVOID GAS PRODUCING BEVERAGES. Things to avoid include carbonated beverages (fizzy beverages, sodas)   If your bowels are filled with gas, your surgeon will have difficulty visualizing your pelvic organs which increases your surgical risks.   Your role in recovery Your role is to become active as  soon as directed by your doctor, while still giving yourself time to heal.  Rest when you feel tired. You will be asked to do the following in order to speed your recovery:   - Cough and breathe deeply. This helps to clear and expand your lungs and can prevent pneumonia after surgery.  - STAY ACTIVE WHEN YOU GET HOME. Do mild physical activity. Walking or moving your legs help your circulation and body functions return to normal. Do not try to get up or walk alone the first time after surgery.   -If you develop swelling on one leg or the other, pain in the back of your leg, redness/warmth in one of your legs, please call the office or go to the Emergency Room to have a doppler to rule out a blood clot. For shortness of breath, chest pain-seek care in the Emergency Room as soon as possible. - Actively manage your pain. Managing your pain lets you move in comfort. We will ask you to rate your pain on a scale of zero to 10. It is your responsibility to tell your doctor or nurse where and how much you hurt so your pain can be treated.   Special Considerations -If you are diabetic, you may be placed on insulin after surgery to have closer control over your blood sugars to promote healing and recovery.  This does not mean that you will be discharged on insulin.  If applicable, your oral antidiabetics will be resumed when you are tolerating a solid diet.   -  Your final pathology results from surgery should be available around one week after surgery and the results will be relayed to you when available.   -Dr. Abdul Hodgkin is the surgeon that assists your GYN Oncologist with surgery.  If you end up staying the night, the next day after your surgery you will either see Dr. Orvil Bland, Dr. Daisey Dryer, or Dr. Abdul Hodgkin.   -FMLA forms can be faxed to [phone] and please allow 5-7 business days for completion.   Pain Management After Surgery -You will be prescribed your pain medication and bowel  regimen medications before surgery closer to the date so that you can have these available when you are discharged from the hospital. The pain medication is for use ONLY AFTER surgery and a new prescription will not be given.    -Make sure that you have Tylenol  IF YOU ARE ABLE TO TAKE THESE MEDICATION at home to use on a regular basis after surgery for pain control.    -Review the attached handout on narcotic use and their risks and side effects.    Bowel Regimen -Continue with your twice daily miralax  after surgery. IF YOU HAVE BOWEL SURGERY, THIS MAY BE ADJUSTED.   Risks of Surgery Risks of surgery are low but include bleeding, infection, damage to surrounding structures, re-operation, blood clots, and very rarely death.     Blood Transfusion Information (For the consent to be signed before surgery)   We will be checking your blood type before surgery so in case of emergencies, we will know what type of blood you would need.                                             WHAT IS A BLOOD TRANSFUSION?   A transfusion is the replacement of blood or some of its parts. Blood is made up of multiple cells which provide different functions. Red blood cells carry oxygen and are used for blood loss replacement. White blood cells fight against infection. Platelets control bleeding. Plasma helps clot blood. Other blood products are available for specialized needs, such as hemophilia or other clotting disorders. BEFORE THE TRANSFUSION  Who gives blood for transfusions?  You may be able to donate blood to be used at a later date on yourself (autologous donation). Relatives can be asked to donate blood. This is generally not any safer than if you have received blood from a stranger. The same precautions are taken to ensure safety when a relative's blood is donated. Healthy volunteers who are fully evaluated to make sure their blood is safe. This is blood bank blood. Transfusion therapy is the safest it  has ever been in the practice of medicine. Before blood is taken from a donor, a complete history is taken to make sure that person has no history of diseases nor engages in risky social behavior (examples are intravenous drug use or sexual activity with multiple partners). The donor's travel history is screened to minimize risk of transmitting infections, such as malaria. The donated blood is tested for signs of infectious diseases, such as HIV and hepatitis. The blood is then tested to be sure it is compatible with you in order to minimize the chance of a transfusion reaction. If you or a relative donates blood, this is often done in anticipation of surgery and is not appropriate for emergency situations. It takes many days  to process the donated blood. RISKS AND COMPLICATIONS Although transfusion therapy is very safe and saves many lives, the main dangers of transfusion include:  Getting an infectious disease. Developing a transfusion reaction. This is an allergic reaction to something in the blood you were given. Every precaution is taken to prevent this. The decision to have a blood transfusion has been considered carefully by your caregiver before blood is given. Blood is not given unless the benefits outweigh the risks.   AFTER SURGERY INSTRUCTIONS   Return to work: 4-6 weeks if applicable   You may have a white honeycomb dressing over your larger incision if you have open surgery. This dressing can be removed 5 days after surgery and you do not need to reapply a new dressing. Once you remove the dressing, you will notice that you have the surgical glue (dermabond) on the incision and this will peel off on its own. You can get this dressing wet in the shower the days after surgery prior to removal on the 5th day.    You will need to be on a blood thinner after surgery to prevent blood clots for 2 weeks versus 4 weeks based on the surgery being through small incisions or a large incision. This can  be given in pill form or injections.    AVOID USE OF NSAIDS (IBUPROFEN , NAPROXEN) WHILE TAKING THE BLOOD THINNER.    Activity: 1. Be up and out of the bed during the day.  Take a nap if needed.  You may walk up steps but be careful and use the hand rail.  Stair climbing will tire you more than you think, you may need to stop part way and rest.    2. No lifting or straining for 6 weeks over 10 pounds. No pushing, pulling, straining for 6 weeks.   3. No driving for 9-60 days when the following criteria have been met: Do not drive if you are taking narcotic pain medicine and make sure that your reaction time has returned.    4. You can shower as soon as the next day after surgery. Shower daily.  Use your regular soap and water  (not directly on the incision) and pat your incision(s) dry afterwards; don't rub.  No tub baths or submerging your body in water  until cleared by your surgeon. If you have the soap that was given to you by pre-surgical testing that was used before surgery, you do not need to use it afterwards because this can irritate your incisions.    5. No sexual activity and nothing in the vagina for 6 weeks.   6. You may experience a small amount of clear drainage from your incisions, which is normal.  If the drainage persists, increases, or changes color please call the office.   7. Do not use creams, lotions, or ointments such as neosporin on your incisions after surgery until advised by your surgeon because they can cause removal of the dermabond glue on your incisions.     8. Take Tylenol  first for pain if you are able to take these medication and only use narcotic pain medication for severe pain not relieved by the Tylenol .  Monitor your Tylenol  intake to a max of 4,000 mg in a 24 hour period.    Diet: 1. Low sodium Heart Healthy Diet is recommended but you are cleared to resume your normal (before surgery) diet after your procedure.   2. It is safe to use a laxative, such as  Miralax   or Colace, if you have difficulty moving your bowels before surgery. You can continue on your twice daily miralax . If bowel surgery is performed, this plan may be adjusted.     Wound Care: 1. Keep clean and dry.  Shower daily.   Reasons to call the Doctor: Fever - Oral temperature greater than 100.4 degrees Fahrenheit Foul-smelling vaginal discharge Difficulty urinating Nausea and vomiting Increased pain at the site of the incision that is unrelieved with pain medicine. Difficulty breathing with or without chest pain New calf pain especially if only on one side Sudden, continuing increased vaginal bleeding with or without clots.   Contacts: For questions or concerns you should contact:   Dr. Wiley Hanger at [phone]   Vira Grieves, NP at [phone]   After Hours: call [phone] and have the GYN Oncologist paged/contacted (after 5 pm or on the weekends). You will speak with an after hours RN and let he or she know you have had surgery.   Messages sent via mychart are for non-urgent matters and are not responded to after hours so for urgent needs, please call the after hours number.   GYN Oncology Bowel Preparation for surgery   There are two important steps to take to prepare for your surgery:   1. Cleansing of your colon: all the stool is washed out of your colon.   2. Take antibiotics: taken by mouth to help prevent infection.   INSTRUCTIONS:   As Soon As Possible (at least 2-3 days BEFORE your surgery)   Please buy the following (5) items from a pharmacy:   The first item is an antibiotic. The first four items will be sent in as prescriptions and you can get the Gatorade or powerade at the drug store or grocery store.    1. Flagyl  pills - 1 gram, 3 doses (antibiotic)   The next 2 items are laxatives and work to cleanse your colon.   3. 2 Dulcolax 5 mg tablets   4. 238 grams of Miralax  (8.3 ounce bottle)   5. 64 oz of Gatorade or Powerade (not  red)   (NOTE: If you are allergic to any of these medications, the prep will NOT be prescribed for your. Please let your physician know of any allergies.)   The Day Before Your Surgery   1. Do NOT eat any solid food. Do NOT drink unfiltered juices such as apple cider. Drink only clear liquids such as juice, black coffee, tea, sports drinks, soda pop, Jell-O, water . Please refer to handout from the pre-care suite for more details).   2. Starting at 9:00 a.m.: Take 2 Dulcolax tablets with 2 glasses of clear liquid.   3. 11:00 a.m.: Mix whole bottle of Miralax  in 64 oz of Gatorade and drink one 8 oz glass every 15 minutes until gone.   4. When you have finished the Miralax , drink at least 4 glasses of clear liquids of your choice. You will start experiencing diarrhea anywhere between 30 minutes to 3 hours after completing the Miralax . Keep drinking plenty of clear liquids throughout the day. This will keep you from getting dehydrated from the diarrhea.   5. Take your antibiotics by mouth at these times after you complete the Miralax :   - At 2 p.m.: Take Flagyl  (1 gram, total of two tablets)   - At 3 p.m.: Take Flagyl  2 tablets (1000 mg total or 1 gm)   - At 10 p.m.: Take Flagyl  2 tablets (1000 mg total or 1 gm)  The Day of Your Surgery   On the morning of your surgery, only take the medicines that the pre-care suite told you were okay to take. Take them only with a sip of water .   Special Instructions:   - Please be sure you make your surgeon aware if you have diabetes. Your diabetic medications may need to be adjusted.   - If you feel dizzy or have severe nausea, vomiting or abdominal pain, or if you cannot finish drinking the bowel prep, please call the Gynecologic Oncology clinic at [phone] or your surgeon.   - If you have any life-threatening symptoms including wheezing, chest tightness, fever, swelling of your face, lips, tongue or throat, call [phone]- right away.    Questions?   Please call if you have questions or concerns:   - Weekdays 8:00 a.m. to 5:00 pm: Call the office at [phone]   - After hours and on weekends and holidays, call the paging operator at [phone] and ask for the GYN ONC on call to be paged.

## 2023-08-06 NOTE — H&P (View-Only) (Signed)
 Gynecologic Oncology Return Clinic Visit  08/06/23  Reason for Visit: treatment planning  Treatment History: Oncology History  Gynecologic cancer [facility])  05/27/2023 Initial Diagnosis   Gynecologic cancer (HCC)   05/27/2023 Cancer Staging   Staging form: Ovary, Fallopian Tube, and Primary Peritoneal Carcinoma, AJCC 8th Edition - Clinical: FIGO Stage IV (cTX, cN1, cM1) - Signed by Sumner Ends, MD on 05/27/2023 Stage prefix: Initial diagnosis   06/02/2023 -  Chemotherapy   Patient is on Treatment Plan : OVARIAN Carboplatin  (AUC 6) + Paclitaxel  (175) q21d X 6 Cycles     Ovarian cancer (HCC)  06/16/2023 Initial Diagnosis   Ovarian cancer (HCC)   07/15/2023 Genetic Testing   Negative genetic testing on the CancerNext-Expanded+RNAinsight panel.  The report date is Jul 14, 2023.  The CancerNext-Expanded gene panel offered by [facility] and includes sequencing, rearrangement, and RNA analysis for the following 77 genes: AIP, ALK, APC, ATM, BAP1, BARD1, BMPR1A, BRCA1, BRCA2, BRIP1, CDC73, CDH1, CDK4, CDKN1B, CDKN2A, CEBPA, CHEK2, CTNNA1, DDX41, DICER1, ETV6, FH, FLCN, GATA2, LZTR1, MAX, MBD4, MEN1, MET, MLH1, MSH2, MSH3, MSH6, MUTYH, NF1, NF2, NTHL1, PALB2, PHOX2B, PMS2, POT1, PRKAR1A, PTCH1, PTEN, RAD51C, RAD51D, RB1, RET, RPS20, RUNX1, SDHA, SDHAF2, SDHB, SDHC, SDHD, SMAD4, SMARCA4, SMARCB1, SMARCE1, STK11, SUFU, TMEM127, TP53, TSC1, TSC2, VHL, and WT1 (sequencing and deletion/duplication); AXIN2, CTNNA1, DDX41, EGFR, HOXB13, KIT, MBD4, MITF, MSH3, PDGFRA, POLD1 and POLE (sequencing only); EPCAM and GREM1 (deletion/duplication only). RNA data is routinely analyzed for use in variant interpretation for all genes.      Interval History: Doing very well.  Overall feels better than she has in quite some time.  Endorses regular bowel function with MiraLAX  twice a day.  Denies any blood in her stool.  Denies any vaginal bleeding.  Has occasional urinary incontinence although is trying to drink at  least 80 ounces of fluid daily.  Reports improved appetite without nausea or emesis.  Past Medical/Surgical History: Past Medical History:  Diagnosis Date   Allergy    Anxiety    Through divorce   Arthritis    ASCUS with positive high risk HPV 12/2006   Negative Colpo BX   ASCUS with positive high risk HPV 2009   Chronic kidney disease    kidney stones   CIN III (cervical intraepithelial neoplasia III) 07/1983   tx'd w/cryo- no subsequent abnormal paps until 2008   Essential hypertension    Family history of colon cancer    Fibroid 2006   3 cm Right Fundal Fibroid   Genetic testing 07/15/2023   Negative genetic testing on the CancerNext-Expanded+RNAinsight panel.  The report date is Jul 14, 2023.  The CancerNext-Expanded gene panel offered by [facility] and includes sequencing, rearrangement, and RNA analysis for the following 77 genes: AIP, ALK, APC, ATM, BAP1, BARD1, BMPR1A, BRCA1, BRCA2, BRIP1, CDC73, CDH1, CDK4, CDKN1B, CDKN2A, CEBPA, CHEK2, CTNNA1, DDX41, DICER1, ETV6, FH, FL*   GERD (gastroesophageal reflux disease) 2011   History of kidney stones    Hypertension    Osteopenia 2020   Synovial cyst of lumbar spine 10/2014   Vitamin D  deficiency     Past Surgical History:  Procedure Laterality Date   ABDOMINAL HYSTERECTOMY  02/23/2011   R TLH-fibroids, adenomyosis 221 g, & focal CIN I w/clear margins   ANTERIOR CERVICAL DECOMP/DISCECTOMY FUSION N/A 07/19/2012   Procedure: ANTERIOR CERVICAL DECOMPRESSION/DISCECTOMY FUSION 2 LEVELS;  Surgeon: Manya Sells, MD;  Location: [facility];  Service: Neurosurgery;  Laterality: N/A;  Anterior Cervical Five-Six Cervical Six-Seven  Anterior Cervical Decompression/Diskectomy/Fusion   cataracts Bilateral 03/2022   COLONOSCOPY     CYSTOSCOPY  02/23/2011   PT.STATES SHE DID NOT HAVE   dental implants     IR IMAGING GUIDED PORT INSERTION  05/31/2023   LASIK  '99   LUMBAR LAMINECTOMY/DECOMPRESSION MICRODISCECTOMY Right 02/05/2015    Procedure: Right Lumbar four-five Laminectomy with Resection of Synovial Cyst ;  Surgeon: Manya Sells, MD;  Location: [facility];  Service: Neurosurgery;  Laterality: Right;   MOHS SURGERY     x 2   MOLE REMOVAL  2023   REPLACEMENT TOTAL KNEE Right 09/2021   TONSILLECTOMY AND ADENOIDECTOMY     VAGINAL DELIVERY  '92 '94    Family History  Problem Relation Age of Onset   Hypertension Mother    Heart disease Mother    Lung cancer Mother    Hypertension Father    Heart attack Father    Colon cancer Neg Hx    Esophageal cancer Neg Hx    Pancreatic cancer Neg Hx    Prostate cancer Neg Hx    Rectal cancer Neg Hx    Stomach cancer Neg Hx    Breast cancer Neg Hx     Social History   Socioeconomic History   Marital status: Divorced    Spouse name: Not on file   Number of children: 2   Years of education: Not on file   Highest education level: Not on file  Occupational History   Occupation: Teacher, adult education: [employer]  Tobacco Use   Smoking status: Never   Smokeless tobacco: Never  Vaping Use   Vaping status: Never Used  Substance and Sexual Activity   Alcohol use: Yes    Alcohol/week: 10.0 - 12.0 standard drinks of alcohol    Types: 10 - 12 Glasses of wine per week    Comment: 10-12 glasses of wine a week   Drug use: No   Sexual activity: Not Currently    Partners: Male    Birth control/protection: Post-menopausal, Surgical    Comment: R-TLH  Other Topics Concern   Not on file  Social History Narrative   Not on file   Social Drivers of Health   Financial Resource Strain: Not on file  Food Insecurity: No Food Insecurity (05/27/2023)   Hunger Vital Sign    Worried About Running Out of Food in the Last Year: Never true    Ran Out of Food in the Last Year: Never true  Transportation Needs: No Transportation Needs (05/27/2023)   PRAPARE - Administrator, Civil Service (Medical): No    Lack of Transportation (Non-Medical): No  Physical Activity: Not on  file  Stress: Not on file  Social Connections: Not on file    Current Medications:  Current Outpatient Medications:    [START ON 08/31/2023] bisacodyl  (DULCOLAX) 5 MG EC tablet, The day before surgery starting at 9:00 a.m.: Take 2 Dulcolax tablets with 2 glasses of clear liquid., Disp: 2 tablet, Rfl: 0   [START ON 08/31/2023] metroNIDAZOLE  (FLAGYL ) 500 MG tablet, The day before surgery, take two tablets (1000 mg total) at 2 pm, 3 pm, and 10 pm, Disp: 6 tablet, Rfl: 0   [START ON 08/31/2023] polyethylene glycol powder (MIRALAX ) 17 GM/SCOOP powder, The day before surgery at 11:00 am, Mix whole bottle of Miralax  in 64 oz of Gatorade and drink one 8 oz glass every 15 minutes until gone., Disp: 255 g, Rfl: 0   ALPRAZolam  (  XANAX ) 0.25 MG tablet, Take 1 tablet (0.25 mg total) by mouth at bedtime as needed for anxiety., Disp: 30 tablet, Rfl: 0   Calcium  Carb-Cholecalciferol (CALCIUM  500+D3 PO), Take 1 tablet by mouth daily., Disp: , Rfl:    cholecalciferol (VITAMIN D3) 25 MCG (1000 UNIT) tablet, Take 1,000 Units by mouth 3 (three) times a week., Disp: , Rfl:    doxylamine, Sleep, (UNISOM) 25 MG tablet, Take 12.5 mg by mouth at bedtime as needed., Disp: , Rfl:    hydrochlorothiazide  (HYDRODIURIL ) 12.5 MG tablet, Take 1 tablet (12.5 mg total) by mouth every morning., Disp: 90 tablet, Rfl: 1   lidocaine -prilocaine  (EMLA ) cream, Apply 1 Application topically as needed (Apply to port 1-2 hours prior to its use)., Disp: 30 g, Rfl: 0   losartan  (COZAAR ) 25 MG tablet, Take 1 tablet (25 mg total) by mouth daily., Disp: 100 tablet, Rfl: 3   metroNIDAZOLE  (METROCREAM ) 0.75 % cream, Apply 1 application topically to affected area 2 (two) times daily. (Patient not taking: Reported on 08/04/2023), Disp: 45 g, Rfl: 3   Multiple Vitamins-Minerals (MULTIVITAMINS THER. W/MINERALS) TABS, Take 1 tablet by mouth daily., Disp: , Rfl:    ondansetron  (ZOFRAN -ODT) 4 MG disintegrating tablet, Dissolve 1 tablet (4 mg total) by mouth  every 8 (eight) hours as needed for nausea or vomiting. (Patient not taking: Reported on 08/04/2023), Disp: 20 tablet, Rfl: 0   polyethylene glycol (MIRALAX  / GLYCOLAX ) 17 g packet, Take 17 g by mouth 2 (two) times daily., Disp: , Rfl:    prochlorperazine  (COMPAZINE ) 10 MG tablet, Take 1 tablet (10 mg total) by mouth every 6 (six) hours as needed for nausea or vomiting. (Patient not taking: Reported on 08/04/2023), Disp: 30 tablet, Rfl: 3   rosuvastatin  (CRESTOR ) 5 MG tablet, Take 1 tablet (5 mg total) by mouth daily., Disp: 90 tablet, Rfl: 3   vitamin B-12 (CYANOCOBALAMIN) 100 MCG tablet, Take 100 mcg by mouth 3 (three) times a week., Disp: , Rfl:    zolpidem  (AMBIEN ) 10 MG tablet, Take 1 tablet (10 mg total) by mouth at bedtime as needed for insomnia., Disp: 30 tablet, Rfl: 1  Review of Systems: Denies appetite changes, fevers, chills, fatigue, unexplained weight changes. Denies hearing loss, neck lumps or masses, mouth sores, ringing in ears or voice changes. Denies cough or wheezing.  Denies shortness of breath. Denies chest pain or palpitations. Denies leg swelling. Denies abdominal distention, pain, blood in stools, constipation, diarrhea, nausea, vomiting, or early satiety. Denies pain with intercourse, dysuria, frequency, hematuria or incontinence. Denies hot flashes, pelvic pain, vaginal bleeding or vaginal discharge.   Denies joint pain, back pain or muscle pain/cramps. Denies itching, rash, or wounds. Denies dizziness, headaches, numbness or seizures. Denies swollen lymph nodes or glands, denies easy bruising or bleeding. Denies anxiety, depression, confusion, or decreased concentration.  Physical Exam: BP 125/79 (BP Location: Left Arm, Patient Position: Sitting)   Pulse 79   Temp 98.7 F (37.1 C) (Oral)   Resp 16   Ht 5' 7 (1.702 m)   Wt 135 lb (61.2 kg)   LMP 02/02/2011   SpO2 100%   BMI 21.14 kg/m  General: Alert, oriented, no acute distress. HEENT: Posterior oropharynx  clear, sclera anicteric. Chest: Clear to auscultation bilaterally.  No wheezes or rhonchi. Cardiovascular: Regular rate and rhythm, no murmurs.  Laboratory & Radiologic Studies:    Latest Ref Rng & Units 08/04/2023   10:30 AM 07/15/2023    8:24 AM 06/23/2023    7:49 AM  CBC  WBC 4.0 - 10.5 K/uL 7.3  5.6  7.4   Hemoglobin 12.0 - 15.0 g/dL 40.9  81.1  91.4   Hematocrit 36.0 - 46.0 % 34.9  34.6  31.3   Platelets 150 - 400 K/uL 350  336  587       Latest Ref Rng & Units 08/04/2023   10:30 AM 07/15/2023    8:24 AM 06/23/2023    7:49 AM  BMP  Glucose 70 - 99 mg/dL 782  95  956   BUN 8 - 23 mg/dL 12  13  12    Creatinine 0.44 - 1.00 mg/dL 2.13  0.86  5.78   Sodium 135 - 145 mmol/L 141  140  138   Potassium 3.5 - 5.1 mmol/L 3.7  3.7  4.0   Chloride 98 - 111 mmol/L 103  103  101   CO2 22 - 32 mmol/L 25  26  27    Calcium  8.9 - 10.3 mg/dL 9.3  9.6  9.5    Component Ref Range & Units (hover) 2 d ago 3 wk ago 1 mo ago 2 mo ago  Cancer Antigen (CA) 125 109.0 High  420.0 High  CM 1,412.0 High  CM 2,656.0 High  CM   08/02/23: CT C/A/P 1. Moderate to marked response to therapy of low cervical, thoracic, abdominal, and pelvic nodal metastasis. 2. Moderate response to therapy of abdominopelvic omental/peritoneal metastasis. 3. Similar to minimal decrease in size of a dominant left pelvic mass. New mild left-sided hydroureteronephrosis which is likely secondary to this dominant mass. 4. No bowel obstruction or other complication. 5. Incidental findings, including: Coronary artery atherosclerosis. Aortic Atherosclerosis (ICD10-I70.0). Possible constipation. Left nephrolithiasis.  Assessment & Plan: Debra Nguyen is a 68 y.o. 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  S/p C4 of NACT (carbo/taxol ) 6/18. Germline: no pathogenic mutations.  Patient is overall doing very well, tolerating chemotherapy without any significant side effects.  Feeling much better  since starting treatment.  Received cycle 4 earlier this week.  She appears to have had an excellent response to treatment thus far.  Discussed almost normalization of CA125.  Looked at recent CT images together which show significant decrease in size of metastatic disease, similar size of left pelvic mass, not surprising given its original size and mostly cystic component.  We discussed plan for diagnostic laparoscopy to assess feasibility of interval debulking surgery.  Given colonic involvement at time of diagnosis, anticipate need for rectosigmoid resection with reanastomosis.  Plan for preoperative bowel preparation.  Will also have wound ostomy marker for stoma.  We reviewed the plan for a diagnostic laparoscopy, open bilateral salpingo-oophorectomy, tumor debulking including bowel resection, possible laparotomy. The risks of surgery were discussed in detail and she understands these to include infection; wound separation; hernia; injury to adjacent organs such as bowel, bladder, blood vessels, ureters and nerves; bleeding which may require blood transfusion; anesthesia risk; thromboembolic events; possible death; unforeseen complications; possible need for re-exploration; medical complications such as heart attack, stroke, pleural effusion and pneumonia; and, if full lymphadenectomy is performed the risk of lymphedema and lymphocyst. The patient will receive DVT and antibiotic prophylaxis as indicated. She voiced a clear understanding. She had the opportunity to ask questions. Perioperative instructions were reviewed with her. Prescriptions for post-op medications were sent to her pharmacy of choice.  38 minutes of total time was spent for this patient encounter, including preparation, face-to-face counseling with the patient and coordination of  care, and documentation of the encounter.  Wiley Hanger, MD  Division of Gynecologic Oncology  Department of Obstetrics and Gynecology  [facility] of  [facility]

## 2023-08-06 NOTE — Progress Notes (Signed)
 Gynecologic Oncology Return Clinic Visit  08/06/23  Reason for Visit: treatment planning  Treatment History: Oncology History  Gynecologic cancer [facility])  05/27/2023 Initial Diagnosis   Gynecologic cancer (HCC)   05/27/2023 Cancer Staging   Staging form: Ovary, Fallopian Tube, and Primary Peritoneal Carcinoma, AJCC 8th Edition - Clinical: FIGO Stage IV (cTX, cN1, cM1) - Signed by Sumner Ends, MD on 05/27/2023 Stage prefix: Initial diagnosis   06/02/2023 -  Chemotherapy   Patient is on Treatment Plan : OVARIAN Carboplatin  (AUC 6) + Paclitaxel  (175) q21d X 6 Cycles     Ovarian cancer (HCC)  06/16/2023 Initial Diagnosis   Ovarian cancer (HCC)   07/15/2023 Genetic Testing   Negative genetic testing on the CancerNext-Expanded+RNAinsight panel.  The report date is Jul 14, 2023.  The CancerNext-Expanded gene panel offered by [facility] and includes sequencing, rearrangement, and RNA analysis for the following 77 genes: AIP, ALK, APC, ATM, BAP1, BARD1, BMPR1A, BRCA1, BRCA2, BRIP1, CDC73, CDH1, CDK4, CDKN1B, CDKN2A, CEBPA, CHEK2, CTNNA1, DDX41, DICER1, ETV6, FH, FLCN, GATA2, LZTR1, MAX, MBD4, MEN1, MET, MLH1, MSH2, MSH3, MSH6, MUTYH, NF1, NF2, NTHL1, PALB2, PHOX2B, PMS2, POT1, PRKAR1A, PTCH1, PTEN, RAD51C, RAD51D, RB1, RET, RPS20, RUNX1, SDHA, SDHAF2, SDHB, SDHC, SDHD, SMAD4, SMARCA4, SMARCB1, SMARCE1, STK11, SUFU, TMEM127, TP53, TSC1, TSC2, VHL, and WT1 (sequencing and deletion/duplication); AXIN2, CTNNA1, DDX41, EGFR, HOXB13, KIT, MBD4, MITF, MSH3, PDGFRA, POLD1 and POLE (sequencing only); EPCAM and GREM1 (deletion/duplication only). RNA data is routinely analyzed for use in variant interpretation for all genes.      Interval History: Doing very well.  Overall feels better than she has in quite some time.  Endorses regular bowel function with MiraLAX  twice a day.  Denies any blood in her stool.  Denies any vaginal bleeding.  Has occasional urinary incontinence although is trying to drink at  least 80 ounces of fluid daily.  Reports improved appetite without nausea or emesis.  Past Medical/Surgical History: Past Medical History:  Diagnosis Date   Allergy    Anxiety    Through divorce   Arthritis    ASCUS with positive high risk HPV 12/2006   Negative Colpo BX   ASCUS with positive high risk HPV 2009   Chronic kidney disease    kidney stones   CIN III (cervical intraepithelial neoplasia III) 07/1983   tx'd w/cryo- no subsequent abnormal paps until 2008   Essential hypertension    Family history of colon cancer    Fibroid 2006   3 cm Right Fundal Fibroid   Genetic testing 07/15/2023   Negative genetic testing on the CancerNext-Expanded+RNAinsight panel.  The report date is Jul 14, 2023.  The CancerNext-Expanded gene panel offered by [facility] and includes sequencing, rearrangement, and RNA analysis for the following 77 genes: AIP, ALK, APC, ATM, BAP1, BARD1, BMPR1A, BRCA1, BRCA2, BRIP1, CDC73, CDH1, CDK4, CDKN1B, CDKN2A, CEBPA, CHEK2, CTNNA1, DDX41, DICER1, ETV6, FH, FL*   GERD (gastroesophageal reflux disease) 2011   History of kidney stones    Hypertension    Osteopenia 2020   Synovial cyst of lumbar spine 10/2014   Vitamin D  deficiency     Past Surgical History:  Procedure Laterality Date   ABDOMINAL HYSTERECTOMY  02/23/2011   R TLH-fibroids, adenomyosis 221 g, & focal CIN I w/clear margins   ANTERIOR CERVICAL DECOMP/DISCECTOMY FUSION N/A 07/19/2012   Procedure: ANTERIOR CERVICAL DECOMPRESSION/DISCECTOMY FUSION 2 LEVELS;  Surgeon: Manya Sells, MD;  Location: [facility];  Service: Neurosurgery;  Laterality: N/A;  Anterior Cervical Five-Six Cervical Six-Seven  Anterior Cervical Decompression/Diskectomy/Fusion   cataracts Bilateral 03/2022   COLONOSCOPY     CYSTOSCOPY  02/23/2011   PT.STATES SHE DID NOT HAVE   dental implants     IR IMAGING GUIDED PORT INSERTION  05/31/2023   LASIK  '99   LUMBAR LAMINECTOMY/DECOMPRESSION MICRODISCECTOMY Right 02/05/2015    Procedure: Right Lumbar four-five Laminectomy with Resection of Synovial Cyst ;  Surgeon: Manya Sells, MD;  Location: [facility];  Service: Neurosurgery;  Laterality: Right;   MOHS SURGERY     x 2   MOLE REMOVAL  2023   REPLACEMENT TOTAL KNEE Right 09/2021   TONSILLECTOMY AND ADENOIDECTOMY     VAGINAL DELIVERY  '92 '94    Family History  Problem Relation Age of Onset   Hypertension Mother    Heart disease Mother    Lung cancer Mother    Hypertension Father    Heart attack Father    Colon cancer Neg Hx    Esophageal cancer Neg Hx    Pancreatic cancer Neg Hx    Prostate cancer Neg Hx    Rectal cancer Neg Hx    Stomach cancer Neg Hx    Breast cancer Neg Hx     Social History   Socioeconomic History   Marital status: Divorced    Spouse name: Not on file   Number of children: 2   Years of education: Not on file   Highest education level: Not on file  Occupational History   Occupation: Teacher, adult education: [employer]  Tobacco Use   Smoking status: Never   Smokeless tobacco: Never  Vaping Use   Vaping status: Never Used  Substance and Sexual Activity   Alcohol use: Yes    Alcohol/week: 10.0 - 12.0 standard drinks of alcohol    Types: 10 - 12 Glasses of wine per week    Comment: 10-12 glasses of wine a week   Drug use: No   Sexual activity: Not Currently    Partners: Male    Birth control/protection: Post-menopausal, Surgical    Comment: R-TLH  Other Topics Concern   Not on file  Social History Narrative   Not on file   Social Drivers of Health   Financial Resource Strain: Not on file  Food Insecurity: No Food Insecurity (05/27/2023)   Hunger Vital Sign    Worried About Running Out of Food in the Last Year: Never true    Ran Out of Food in the Last Year: Never true  Transportation Needs: No Transportation Needs (05/27/2023)   PRAPARE - Administrator, Civil Service (Medical): No    Lack of Transportation (Non-Medical): No  Physical Activity: Not on  file  Stress: Not on file  Social Connections: Not on file    Current Medications:  Current Outpatient Medications:    [START ON 08/31/2023] bisacodyl  (DULCOLAX) 5 MG EC tablet, The day before surgery starting at 9:00 a.m.: Take 2 Dulcolax tablets with 2 glasses of clear liquid., Disp: 2 tablet, Rfl: 0   [START ON 08/31/2023] metroNIDAZOLE  (FLAGYL ) 500 MG tablet, The day before surgery, take two tablets (1000 mg total) at 2 pm, 3 pm, and 10 pm, Disp: 6 tablet, Rfl: 0   [START ON 08/31/2023] polyethylene glycol powder (MIRALAX ) 17 GM/SCOOP powder, The day before surgery at 11:00 am, Mix whole bottle of Miralax  in 64 oz of Gatorade and drink one 8 oz glass every 15 minutes until gone., Disp: 255 g, Rfl: 0   ALPRAZolam  (  XANAX ) 0.25 MG tablet, Take 1 tablet (0.25 mg total) by mouth at bedtime as needed for anxiety., Disp: 30 tablet, Rfl: 0   Calcium  Carb-Cholecalciferol (CALCIUM  500+D3 PO), Take 1 tablet by mouth daily., Disp: , Rfl:    cholecalciferol (VITAMIN D3) 25 MCG (1000 UNIT) tablet, Take 1,000 Units by mouth 3 (three) times a week., Disp: , Rfl:    doxylamine, Sleep, (UNISOM) 25 MG tablet, Take 12.5 mg by mouth at bedtime as needed., Disp: , Rfl:    hydrochlorothiazide  (HYDRODIURIL ) 12.5 MG tablet, Take 1 tablet (12.5 mg total) by mouth every morning., Disp: 90 tablet, Rfl: 1   lidocaine -prilocaine  (EMLA ) cream, Apply 1 Application topically as needed (Apply to port 1-2 hours prior to its use)., Disp: 30 g, Rfl: 0   losartan  (COZAAR ) 25 MG tablet, Take 1 tablet (25 mg total) by mouth daily., Disp: 100 tablet, Rfl: 3   metroNIDAZOLE  (METROCREAM ) 0.75 % cream, Apply 1 application topically to affected area 2 (two) times daily. (Patient not taking: Reported on 08/04/2023), Disp: 45 g, Rfl: 3   Multiple Vitamins-Minerals (MULTIVITAMINS THER. W/MINERALS) TABS, Take 1 tablet by mouth daily., Disp: , Rfl:    ondansetron  (ZOFRAN -ODT) 4 MG disintegrating tablet, Dissolve 1 tablet (4 mg total) by mouth  every 8 (eight) hours as needed for nausea or vomiting. (Patient not taking: Reported on 08/04/2023), Disp: 20 tablet, Rfl: 0   polyethylene glycol (MIRALAX  / GLYCOLAX ) 17 g packet, Take 17 g by mouth 2 (two) times daily., Disp: , Rfl:    prochlorperazine  (COMPAZINE ) 10 MG tablet, Take 1 tablet (10 mg total) by mouth every 6 (six) hours as needed for nausea or vomiting. (Patient not taking: Reported on 08/04/2023), Disp: 30 tablet, Rfl: 3   rosuvastatin  (CRESTOR ) 5 MG tablet, Take 1 tablet (5 mg total) by mouth daily., Disp: 90 tablet, Rfl: 3   vitamin B-12 (CYANOCOBALAMIN) 100 MCG tablet, Take 100 mcg by mouth 3 (three) times a week., Disp: , Rfl:    zolpidem  (AMBIEN ) 10 MG tablet, Take 1 tablet (10 mg total) by mouth at bedtime as needed for insomnia., Disp: 30 tablet, Rfl: 1  Review of Systems: Denies appetite changes, fevers, chills, fatigue, unexplained weight changes. Denies hearing loss, neck lumps or masses, mouth sores, ringing in ears or voice changes. Denies cough or wheezing.  Denies shortness of breath. Denies chest pain or palpitations. Denies leg swelling. Denies abdominal distention, pain, blood in stools, constipation, diarrhea, nausea, vomiting, or early satiety. Denies pain with intercourse, dysuria, frequency, hematuria or incontinence. Denies hot flashes, pelvic pain, vaginal bleeding or vaginal discharge.   Denies joint pain, back pain or muscle pain/cramps. Denies itching, rash, or wounds. Denies dizziness, headaches, numbness or seizures. Denies swollen lymph nodes or glands, denies easy bruising or bleeding. Denies anxiety, depression, confusion, or decreased concentration.  Physical Exam: BP 125/79 (BP Location: Left Arm, Patient Position: Sitting)   Pulse 79   Temp 98.7 F (37.1 C) (Oral)   Resp 16   Ht 5' 7 (1.702 m)   Wt 135 lb (61.2 kg)   LMP 02/02/2011   SpO2 100%   BMI 21.14 kg/m  General: Alert, oriented, no acute distress. HEENT: Posterior oropharynx  clear, sclera anicteric. Chest: Clear to auscultation bilaterally.  No wheezes or rhonchi. Cardiovascular: Regular rate and rhythm, no murmurs.  Laboratory & Radiologic Studies:    Latest Ref Rng & Units 08/04/2023   10:30 AM 07/15/2023    8:24 AM 06/23/2023    7:49 AM  CBC  WBC 4.0 - 10.5 K/uL 7.3  5.6  7.4   Hemoglobin 12.0 - 15.0 g/dL 40.9  81.1  91.4   Hematocrit 36.0 - 46.0 % 34.9  34.6  31.3   Platelets 150 - 400 K/uL 350  336  587       Latest Ref Rng & Units 08/04/2023   10:30 AM 07/15/2023    8:24 AM 06/23/2023    7:49 AM  BMP  Glucose 70 - 99 mg/dL 782  95  956   BUN 8 - 23 mg/dL 12  13  12    Creatinine 0.44 - 1.00 mg/dL 2.13  0.86  5.78   Sodium 135 - 145 mmol/L 141  140  138   Potassium 3.5 - 5.1 mmol/L 3.7  3.7  4.0   Chloride 98 - 111 mmol/L 103  103  101   CO2 22 - 32 mmol/L 25  26  27    Calcium  8.9 - 10.3 mg/dL 9.3  9.6  9.5    Component Ref Range & Units (hover) 2 d ago 3 wk ago 1 mo ago 2 mo ago  Cancer Antigen (CA) 125 109.0 High  420.0 High  CM 1,412.0 High  CM 2,656.0 High  CM   08/02/23: CT C/A/P 1. Moderate to marked response to therapy of low cervical, thoracic, abdominal, and pelvic nodal metastasis. 2. Moderate response to therapy of abdominopelvic omental/peritoneal metastasis. 3. Similar to minimal decrease in size of a dominant left pelvic mass. New mild left-sided hydroureteronephrosis which is likely secondary to this dominant mass. 4. No bowel obstruction or other complication. 5. Incidental findings, including: Coronary artery atherosclerosis. Aortic Atherosclerosis (ICD10-I70.0). Possible constipation. Left nephrolithiasis.  Assessment & Plan: Debra Nguyen is a 68 y.o. 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  S/p C4 of NACT (carbo/taxol ) 6/18. Germline: no pathogenic mutations.  Patient is overall doing very well, tolerating chemotherapy without any significant side effects.  Feeling much better  since starting treatment.  Received cycle 4 earlier this week.  She appears to have had an excellent response to treatment thus far.  Discussed almost normalization of CA125.  Looked at recent CT images together which show significant decrease in size of metastatic disease, similar size of left pelvic mass, not surprising given its original size and mostly cystic component.  We discussed plan for diagnostic laparoscopy to assess feasibility of interval debulking surgery.  Given colonic involvement at time of diagnosis, anticipate need for rectosigmoid resection with reanastomosis.  Plan for preoperative bowel preparation.  Will also have wound ostomy marker for stoma.  We reviewed the plan for a diagnostic laparoscopy, open bilateral salpingo-oophorectomy, tumor debulking including bowel resection, possible laparotomy. The risks of surgery were discussed in detail and she understands these to include infection; wound separation; hernia; injury to adjacent organs such as bowel, bladder, blood vessels, ureters and nerves; bleeding which may require blood transfusion; anesthesia risk; thromboembolic events; possible death; unforeseen complications; possible need for re-exploration; medical complications such as heart attack, stroke, pleural effusion and pneumonia; and, if full lymphadenectomy is performed the risk of lymphedema and lymphocyst. The patient will receive DVT and antibiotic prophylaxis as indicated. She voiced a clear understanding. She had the opportunity to ask questions. Perioperative instructions were reviewed with her. Prescriptions for post-op medications were sent to her pharmacy of choice.  38 minutes of total time was spent for this patient encounter, including preparation, face-to-face counseling with the patient and coordination of  care, and documentation of the encounter.  Wiley Hanger, MD  Division of Gynecologic Oncology  Department of Obstetrics and Gynecology  [facility] of  [facility]

## 2023-08-06 NOTE — Progress Notes (Signed)
 Patient here for a follow up with Dr. Orvil Bland and for a pre-operative appointment prior to her scheduled surgery on 09/01/2023. She is scheduled for a diagnostic laparoscopy, robotic assisted laparoscopic versus open bilateral salpingo-oophorectomy, omentectomy, debulking, possible bowel surgery including possible ostomy. The surgery was discussed in detail.  See after visit summary for additional details.    Discussed post-op pain management in detail including the aspects of the enhanced recovery pathway.  Advised her that a new prescription would be sent in for Tramadol  and it is only to be used for after her upcoming surgery.  We discussed the use of tylenol  post-op and to monitor for a maximum of 4,000 mg in a 24 hour period.  Also gave her instructions for a bowel prep and discussed using sennakot after surgery and to hold if having loose stools.  Discussed bowel regimen in detail.     Discussed the use of SCDs and measures to take at home to prevent DVT including frequent mobility.  Reportable signs and symptoms of DVT discussed. Post-operative instructions discussed and expectations for after surgery. Incisional care discussed as well including reportable signs and symptoms including erythema, drainage, wound separation.     30 minutes spent with the patient.  Verbalizing understanding of material discussed. No needs or concerns voiced at the end of the visit.   Advised patient to call for any needs.  Advised that her post-operative medications had been prescribed and could be picked up at any time.    This appointment is included in the global surgical bundle as pre-operative teaching and has no charge.

## 2023-08-06 NOTE — Patient Instructions (Signed)

## 2023-08-17 ENCOUNTER — Other Ambulatory Visit: Payer: Self-pay | Admitting: Gastroenterology

## 2023-08-17 ENCOUNTER — Other Ambulatory Visit: Payer: Self-pay

## 2023-08-18 ENCOUNTER — Encounter (HOSPITAL_BASED_OUTPATIENT_CLINIC_OR_DEPARTMENT_OTHER): Payer: Self-pay

## 2023-08-18 ENCOUNTER — Other Ambulatory Visit (HOSPITAL_BASED_OUTPATIENT_CLINIC_OR_DEPARTMENT_OTHER): Payer: Self-pay

## 2023-08-21 NOTE — Patient Instructions (Signed)
 SURGICAL WAITING ROOM VISITATION Patients having surgery or a procedure may have no more than 2 support people in the waiting area - these visitors may rotate in the visitor waiting room.   If the patient needs to stay at the hospital during part of their recovery, the visitor guidelines for inpatient rooms apply.  PRE-OP VISITATION  Pre-op nurse will coordinate an appropriate time for 1 support person to accompany the patient in pre-op.  This support person may not rotate.  This visitor will be contacted when the time is appropriate for the visitor to come back in the pre-op area.  Please refer to the [facility] website for the visitor guidelines for Inpatients (after your surgery is over and you are in a regular room).  You are not required to quarantine at this time prior to your surgery. However, you must do this: Hand Hygiene often Do NOT share personal items Notify your provider if you are in close contact with someone who has COVID or you develop fever 100.4 or greater, new onset of sneezing, cough, sore throat, shortness of breath or body aches.  If you test positive for Covid or have been in contact with anyone that has tested positive in the last 10 days please notify you surgeon.    Your procedure is scheduled on:  [facility] September 01, 2023  Report to [facility] Main Entrance: [address] entrance where the [employer] is available.   Report to admitting at:  09:45   AM  Call this number if you have any questions or problems the morning of surgery [phone]  FOLLOW ANY ADDITIONAL PRE OP INSTRUCTIONS YOU RECEIVED FROM YOUR SURGEON'S OFFICE!!!  Do not eat food after Midnight the night prior to your surgery/procedure.  After Midnight you may have the following liquids until  09:00 AM DAY OF SURGERY  Clear Liquid Diet Water  Black Coffee (sugar ok, NO MILK/CREAM OR CREAMERS)  Tea (sugar ok, NO MILK/CREAM OR CREAMERS) regular and decaf                              Plain Jell-O  with no fruit (NO RED)                                           Fruit ices (not with fruit pulp, NO RED)                                     Popsicles (NO RED)                                                                  Juice: NO CITRUS JUICES: only apple, WHITE grape, WHITE cranberry Sports drinks like Gatorade or Powerade (NO RED)                Oral Hygiene is also important to reduce your risk of infection.        Remember - BRUSH YOUR TEETH THE MORNING OF SURGERY WITH YOUR REGULAR TOOTHPASTE  Do NOT smoke after  Midnight the night before surgery.  STOP TAKING all Vitamins, Herbs and supplements 1 week before your surgery.   Take ONLY these medicines the morning of surgery with A SIP OF WATER : Alprazolam  if needed.   DO NOT TAKE LOSARTAN  / HCTZ the morning of your surgery.  You may not have any metal on your body including hair pins, jewelry, and body piercing  Do not wear make-up, lotions, powders, perfumes  or deodorant  Do not wear nail polish including gel and S&S, artificial / acrylic nails, or any other type of covering on natural nails including finger and toenails. If you have artificial nails, gel coating, etc., that needs to be removed by a nail salon, Please have this removed prior to surgery. Not doing so may mean that your surgery could be cancelled or delayed if the Surgeon or anesthesia staff feels like they are unable to monitor you safely.   Do not shave 48 hours prior to surgery to avoid nicks in your skin which may contribute to postoperative infections.   Contacts, Hearing Aids, dentures or bridgework may not be worn into surgery. DENTURES WILL BE REMOVED PRIOR TO SURGERY PLEASE DO NOT APPLY Poly grip OR ADHESIVES!!!  You may bring a small overnight bag with you on the day of surgery, only pack items that are not valuable. [facility] IS NOT RESPONSIBLE   FOR VALUABLES THAT ARE LOST OR STOLEN.   Do not bring your home medications to the  hospital. The Pharmacy will dispense medications listed on your medication list to you during your admission in the Hospital.  Special Instructions: Bring a copy of your healthcare power of attorney and living will documents the day of surgery, if you wish to have them scanned into your [facility]- EPIC  Please read over the following fact sheets you were given: IF YOU HAVE QUESTIONS ABOUT YOUR PRE-OP INSTRUCTIONS, PLEASE CALL [phone]   [facility] - Preparing for Surgery Before surgery, you can play an important role.  Because skin is not sterile, your skin needs to be as free of germs as possible.  You can reduce the number of germs on your skin by washing with CHG (chlorahexidine gluconate) soap before surgery.  CHG is an antiseptic cleaner which kills germs and bonds with the skin to continue killing germs even after washing. Please DO NOT use if you have an allergy to CHG or antibacterial soaps.  If your skin becomes reddened/irritated stop using the CHG and inform your nurse when you arrive at Short Stay. Do not shave (including legs and underarms) for at least 48 hours prior to the first CHG shower.  You may shave your face/neck.  Please follow these instructions carefully:  1.  Shower with CHG Soap the night before surgery and the  morning of surgery.  2.  If you choose to wash your hair, wash your hair first as usual with your normal  shampoo.  3.  After you shampoo, rinse your hair and body thoroughly to remove the shampoo.                             4.  Use CHG as you would any other liquid soap.  You can apply chg directly to the skin and wash.  Gently with a scrungie or clean washcloth.  5.  Apply the CHG Soap to your body ONLY FROM THE NECK DOWN.   Do not use on face/  open                           Wound or open sores. Avoid contact with eyes, ears mouth and genitals (private parts).                       Wash face,  Genitals (private parts) with your normal  soap.             6.  Wash thoroughly, paying special attention to the area where your  surgery  will be performed.  7.  Thoroughly rinse your body with warm water  from the neck down.  8.  DO NOT shower/wash with your normal soap after using and rinsing off the CHG Soap.            9.  Pat yourself dry with a clean towel.            10.  Wear clean pajamas.            11.  Place clean sheets on your bed the night of your first shower and do not  sleep with pets.  ON THE DAY OF SURGERY : Do not apply any lotions/deodorants the morning of surgery.  Please wear clean clothes to the hospital/surgery center.     FAILURE TO FOLLOW THESE INSTRUCTIONS MAY RESULT IN THE CANCELLATION OF YOUR SURGERY  PATIENT SIGNATURE_________________________________  NURSE SIGNATURE__________________________________  ________________________________________________________________________

## 2023-08-21 NOTE — Progress Notes (Signed)
 COVID Vaccine received:  []  No [x]  Yes Date of any COVID positive Test in last 90 days:  PCP - Velna Skeeter, MD at [facility] [phone]  Cardiologist - Samule Sharps, MD) PCP sees now Oncology- [name], MD    Chest x-ray -CT chest 05-28-2023  Epic  EKG - 01-2022   will repeat  Stress Test -  ECHO -  Cardiac Cath -  CT Coronary Calcium  score: 32.3  on 06-19-2020 Epic  Bowel Prep - []  No  [x]   Yes ______  Pacemaker / ICD device [x]  No []  Yes   Spinal Cord Stimulator:[x]  No []  Yes       History of Sleep Apnea? [x]  No []  Yes   CPAP used?- [x]  No []  Yes    Does the patient monitor blood sugar?   [x]  N/A   []  No []  Yes  Patient has: [x]  NO Hx DM   []  Pre-DM   []  DM1  []   DM2  Blood Thinner / Instructions:  none Aspirin  Instructions:   none  ERAS Protocol Ordered: []  No  [x]  Yes PRE-SURGERY []  ENSURE  []  G2   [x]  No Drink Ordere Patient is to be NPO after: 0900  Dental hx: []  Dentures:  []  N/A      []  Bridge or Partial:                   []  Loose or Damaged teeth:   Comments:   Activity level: Able to walk up 2 flights of stairs without becoming significantly short of breath or having chest pain?  []  No   []    Yes   Anesthesia review: CAD ? sees PCP since Dr. Sharps retired. HTN, GERD, Current chemo va Portacath. S/p ACDF C5-6, C6-7 (07-2012)  Patient denies any S&S of respiratory illness or Covid - no shortness of breath, fever, cough or chest pain at PAT appointment.  Patient verbalized understanding and agreement to the Pre-Surgical Instructions that were given to them at this PAT appointment. Patient was also educated of the need to review these PAT instructions again prior to her surgery.I reviewed the appropriate phone numbers to call if they have any and questions or concerns.

## 2023-08-23 ENCOUNTER — Other Ambulatory Visit: Payer: Self-pay | Admitting: Gynecologic Oncology

## 2023-08-23 ENCOUNTER — Telehealth: Payer: Self-pay | Admitting: *Deleted

## 2023-08-23 ENCOUNTER — Encounter (HOSPITAL_COMMUNITY)
Admission: RE | Admit: 2023-08-23 | Discharge: 2023-08-23 | Disposition: A | Discharge status: Home / Self Care | Source: Ambulatory Visit | Attending: Gynecologic Oncology | Admitting: Gynecologic Oncology

## 2023-08-23 ENCOUNTER — Inpatient Hospital Stay

## 2023-08-23 ENCOUNTER — Encounter: Payer: Self-pay | Admitting: Nurse Practitioner

## 2023-08-23 ENCOUNTER — Encounter (HOSPITAL_COMMUNITY): Payer: Self-pay

## 2023-08-23 ENCOUNTER — Ambulatory Visit

## 2023-08-23 ENCOUNTER — Other Ambulatory Visit: Payer: Self-pay

## 2023-08-23 ENCOUNTER — Ambulatory Visit: Payer: Self-pay | Admitting: *Deleted

## 2023-08-23 ENCOUNTER — Other Ambulatory Visit (HOSPITAL_BASED_OUTPATIENT_CLINIC_OR_DEPARTMENT_OTHER): Payer: Self-pay

## 2023-08-23 ENCOUNTER — Inpatient Hospital Stay: Attending: Nurse Practitioner | Admitting: Nurse Practitioner

## 2023-08-23 VITALS — BP 152/82 | HR 96 | Temp 98.7°F | Resp 16 | Ht 67.0 in | Wt 136.0 lb

## 2023-08-23 VITALS — BP 127/77 | HR 85 | Temp 98.1°F | Resp 18

## 2023-08-23 VITALS — BP 139/80 | HR 85 | Temp 97.8°F | Resp 18 | Ht 67.0 in | Wt 136.6 lb

## 2023-08-23 DIAGNOSIS — R971 Elevated cancer antigen 125 [CA 125]: Secondary | ICD-10-CM | POA: Insufficient documentation

## 2023-08-23 DIAGNOSIS — C579 Malignant neoplasm of female genital organ, unspecified: Secondary | ICD-10-CM

## 2023-08-23 DIAGNOSIS — C778 Secondary and unspecified malignant neoplasm of lymph nodes of multiple regions: Secondary | ICD-10-CM | POA: Diagnosis not present

## 2023-08-23 DIAGNOSIS — I10 Essential (primary) hypertension: Secondary | ICD-10-CM | POA: Diagnosis not present

## 2023-08-23 DIAGNOSIS — Z01818 Encounter for other preprocedural examination: Secondary | ICD-10-CM | POA: Diagnosis not present

## 2023-08-23 DIAGNOSIS — E876 Hypokalemia: Secondary | ICD-10-CM

## 2023-08-23 DIAGNOSIS — I2510 Atherosclerotic heart disease of native coronary artery without angina pectoris: Secondary | ICD-10-CM | POA: Insufficient documentation

## 2023-08-23 DIAGNOSIS — Z5111 Encounter for antineoplastic chemotherapy: Secondary | ICD-10-CM | POA: Diagnosis not present

## 2023-08-23 DIAGNOSIS — C786 Secondary malignant neoplasm of retroperitoneum and peritoneum: Secondary | ICD-10-CM | POA: Diagnosis not present

## 2023-08-23 DIAGNOSIS — Z95828 Presence of other vascular implants and grafts: Secondary | ICD-10-CM

## 2023-08-23 LAB — COMPREHENSIVE METABOLIC PANEL WITH GFR
ALT: 36 U/L (ref 0–44)
AST: 27 U/L (ref 15–41)
Albumin: 3.9 g/dL (ref 3.5–5.0)
Alkaline Phosphatase: 109 U/L (ref 38–126)
Anion gap: 9 (ref 5–15)
BUN: 13 mg/dL (ref 8–23)
CO2: 28 mmol/L (ref 22–32)
Calcium: 9.2 mg/dL (ref 8.9–10.3)
Chloride: 102 mmol/L (ref 98–111)
Creatinine, Ser: 0.54 mg/dL (ref 0.44–1.00)
GFR, Estimated: >60 mL/min (ref >60)
Glucose, Bld: 126 mg/dL — ABNORMAL HIGH (ref 70–99)
Potassium: 3.3 mmol/L — ABNORMAL LOW (ref 3.5–5.1)
Sodium: 139 mmol/L (ref 135–145)
Total Bilirubin: 0.9 mg/dL (ref 0.0–1.2)
Total Protein: 7.2 g/dL (ref 6.5–8.1)

## 2023-08-23 LAB — CBC WITH DIFFERENTIAL/PLATELET
Abs Immature Granulocytes: 0.03 K/uL (ref 0.00–0.07)
Basophils Absolute: 0.1 K/uL (ref 0.0–0.1)
Basophils Relative: 1 %
Eosinophils Absolute: 0 K/uL (ref 0.0–0.5)
Eosinophils Relative: 0 %
HCT: 37.5 % (ref 36.0–46.0)
Hemoglobin: 12.2 g/dL (ref 12.0–15.0)
Immature Granulocytes: 0 %
Lymphocytes Relative: 16 %
Lymphs Abs: 1.2 K/uL (ref 0.7–4.0)
MCH: 30.4 pg (ref 26.0–34.0)
MCHC: 32.5 g/dL (ref 30.0–36.0)
MCV: 93.5 fL (ref 80.0–100.0)
Monocytes Absolute: 0.4 K/uL (ref 0.1–1.0)
Monocytes Relative: 6 %
Neutro Abs: 5.7 K/uL (ref 1.7–7.7)
Neutrophils Relative %: 77 %
Platelets: 348 K/uL (ref 150–400)
RBC: 4.01 MIL/uL (ref 3.87–5.11)
RDW: 19.4 % — ABNORMAL HIGH (ref 11.5–15.5)
WBC: 7.4 K/uL (ref 4.0–10.5)
nRBC: 0 % (ref 0.0–0.2)

## 2023-08-23 LAB — CMP (CANCER CENTER ONLY)
ALT: 37 U/L (ref 0–44)
AST: 31 U/L (ref 15–41)
Albumin: 4.1 g/dL (ref 3.5–5.0)
Alkaline Phosphatase: 138 U/L — ABNORMAL HIGH (ref 38–126)
Anion gap: 9 (ref 5–15)
BUN: 13 mg/dL (ref 8–23)
CO2: 28 mmol/L (ref 22–32)
Calcium: 9.1 mg/dL (ref 8.9–10.3)
Chloride: 102 mmol/L (ref 98–111)
Creatinine: 0.54 mg/dL (ref 0.44–1.00)
GFR, Estimated: >60 mL/min (ref >60)
Glucose, Bld: 98 mg/dL (ref 70–99)
Potassium: 3.5 mmol/L (ref 3.5–5.1)
Sodium: 139 mmol/L (ref 135–145)
Total Bilirubin: 0.5 mg/dL (ref 0.0–1.2)
Total Protein: 6.7 g/dL (ref 6.5–8.1)

## 2023-08-23 LAB — CBC WITH DIFFERENTIAL (CANCER CENTER ONLY)
Abs Immature Granulocytes: 0.03 K/uL (ref 0.00–0.07)
Basophils Absolute: 0.1 K/uL (ref 0.0–0.1)
Basophils Relative: 1 %
Eosinophils Absolute: 0.1 K/uL (ref 0.0–0.5)
Eosinophils Relative: 1 %
HCT: 34.5 % — ABNORMAL LOW (ref 36.0–46.0)
Hemoglobin: 11.6 g/dL — ABNORMAL LOW (ref 12.0–15.0)
Immature Granulocytes: 1 %
Lymphocytes Relative: 23 %
Lymphs Abs: 1.4 K/uL (ref 0.7–4.0)
MCH: 30.6 pg (ref 26.0–34.0)
MCHC: 33.6 g/dL (ref 30.0–36.0)
MCV: 91 fL (ref 80.0–100.0)
Monocytes Absolute: 0.5 K/uL (ref 0.1–1.0)
Monocytes Relative: 8 %
Neutro Abs: 4.1 K/uL (ref 1.7–7.7)
Neutrophils Relative %: 66 %
Platelet Count: 318 K/uL (ref 150–400)
RBC: 3.79 MIL/uL — ABNORMAL LOW (ref 3.87–5.11)
RDW: 19.3 % — ABNORMAL HIGH (ref 11.5–15.5)
WBC Count: 6.1 K/uL (ref 4.0–10.5)
nRBC: 0 % (ref 0.0–0.2)

## 2023-08-23 MED ORDER — HEPARIN SOD (PORK) LOCK FLUSH 100 UNIT/ML IV SOLN
500.0000 [IU] | Freq: Once | INTRAVENOUS | Status: AC
  Administered 2023-08-23: 500 [IU] via INTRAVENOUS

## 2023-08-23 MED ORDER — POTASSIUM CHLORIDE CRYS ER 20 MEQ PO TBCR
40.0000 meq | EXTENDED_RELEASE_TABLET | Freq: Once | ORAL | 0 refills | Status: DC
  Filled 2023-08-23 (×2): qty 2, 1d supply, fill #0

## 2023-08-23 MED ORDER — ALPRAZOLAM 0.25 MG PO TABS
0.2500 mg | ORAL_TABLET | Freq: Every evening | ORAL | 0 refills | Status: DC | PRN
  Filled 2023-08-23: qty 30, 30d supply, fill #0

## 2023-08-23 MED ORDER — SODIUM CHLORIDE 0.9% FLUSH
10.0000 mL | INTRAVENOUS | Status: DC | PRN
  Administered 2023-08-23: 10 mL via INTRAVENOUS

## 2023-08-23 NOTE — Consult Note (Addendum)
 WOC Nurse requested for preoperative stoma site marking  Discussed surgical procedure and possible stoma creation with patient.  Explained role of the WOC nurse team.  Provided the patient with educational booklet and provided samples of pouching options. Answered patient's questions.   Examined patient sitting, and standing in order to place the marking in the patient's visual field, away from any creases or abdominal contour issues and within the rectus muscle.  Attempted to mark below the patient's belt line. A significant crease occurs lower on the abd when she leans forward which should be avoided if possible.   Marked for colostomy in the LLQ  _7___ cm to the left of the umbilicus and _3___cm below the umbilicus.  Marked for ileostomy in the RLQ  __7__cm to the right of the umbilicus and  __3__ cm below the umbilicus.  Patient's abdomen cleansed with CHG wipes at site markings, allowed to air dry prior to marking.Covered mark with thin film transparent dressing to preserve mark until date of surgery. Provided with marking pen and told to re-color in the marks if they begin to fade prior to surgery.   WOC Nurse team will follow up with patient after surgery for continued ostomy care and teaching if she receives an ostomy.  Please re-consult if further assistance is needed.  Thank-you,  Stephane Fought MSN, RN, CWOCN, [place], CNS [phone]

## 2023-08-23 NOTE — Progress Notes (Signed)
 Pt seen in infusion room reporting that port-a-cath site was red. VSS. Pt denies itching, pain to area. Area noted to be red, no swelling, blisters. Pt's port-a-cath site shown to MD [facility] via picture. Per MD Cloretta, ok to monitor at home and call [facility] with fever, pain, itching, swelling. Pt agrees with plan of care.

## 2023-08-23 NOTE — Progress Notes (Signed)
  [facility] OFFICE PROGRESS NOTE   Diagnosis: Gynecologic cancer  INTERVAL HISTORY:   [name] returns as scheduled.  She completed cycle 4 Taxol /carboplatin  08/04/2023.  She is scheduled for surgery with [name] 09/01/2023.  She denies nausea/vomiting.  No mouth sores.  No diarrhea.  Mild intermittent tingling in the hands and feet.  Symptoms do not interfere with activity.  Objective:  Vital signs in last 24 hours:  Blood pressure 139/80, pulse 85, temperature 97.8 F (36.6 C), temperature source Temporal, resp. rate 18, height 5' 7 (1.702 m), weight 136 lb 9.6 oz (62 kg), last menstrual period 02/02/2011, SpO2 100%.    HEENT: No thrush or ulcers. Resp: Lungs clear bilaterally. Cardio: Regular rate and rhythm. GI: No hepatosplenomegaly.  No apparent ascites.  Firm fullness left lower abdomen. Vascular: No leg edema. Skin: No rash. Port-A-Cath without erythema  Lab Results:  Lab Results  Component Value Date   WBC 6.1 08/23/2023   HGB 11.6 (L) 08/23/2023   HCT 34.5 (L) 08/23/2023   MCV 91.0 08/23/2023   PLT 318 08/23/2023   NEUTROABS 4.1 08/23/2023    Imaging:  No results found.  Medications: I have reviewed the patient's current medications.  Assessment/Plan: Gynecologic malignancy, unknown primary site CT abdomen/pelvis 05/13/2023-5.0 cm apple core lesion in the upper/mid rectum.  Peritoneal disease/omental caking with multiple cystic metastases noted as well as abdominopelvic lymphadenopathy.  Colonoscopy 05/25/2023-infiltrative partially obstructing large mass in the rectosigmoid colon from 20 to 28 cm from the anal verge.  The mass was circumferential.  Oozing was present.  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 Monia 3 cycles of chemotherapy then repeat imaging, discuss if she is a candidate for debulking surgery Cycle 2 Taxol /carboplatin  06/23/2023, Udenyca  Cycle 3 Taxol /carboplatin  07/15/2023, Udenyca  CTs 08/02/2023-moderate to marked response to therapy of lower cervical, thoracic, abdominal and pelvic nodal metastasis.  Moderate response of abdominopelvic omental/peritoneal metastasis.  Similar to minimal decrease in size of a dominant left pelvic mass.  New mild left-sided hydroureteronephrosis likely secondary to the dominant mass. 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 appears stable.  She has completed 4 cycles of Taxol /carboplatin  chemotherapy.  She tolerated cycle 4 well.  She is scheduled for surgery 09/01/2023.  She will return for follow-up approximately 3 weeks after surgery.  We are available to see her sooner if needed.  Refill for Xanax  sent to pharmacy per her request.    Olam Ned ANP/GNP-BC   08/23/2023  9:23 AM

## 2023-08-23 NOTE — Telephone Encounter (Signed)
 Opened in error

## 2023-08-23 NOTE — Telephone Encounter (Signed)
-----   Message from Eleanor JONETTA Epps sent at 08/23/2023  2:14 PM EDT ----- Her potassium is slightly low at 3.3. Will send in a one time dose and she needs to increase intake of potassium rich foods but not in excess. ----- Message ----- From: Rebecka, [facility] [place] Sent: 08/23/2023  12:32 PM EDT To: Eleanor JONETTA Epps, NP

## 2023-08-23 NOTE — Progress Notes (Signed)
 One time dose of K+ for replacement before surgery. Will recheck the morning of surgery.

## 2023-08-23 NOTE — Telephone Encounter (Signed)
 Ms. Deshazer left VM that her port site looks very red and it's getting larger. She plans to stop by on her way back from her preop appointment at [facility]. Infusion staff notified.

## 2023-08-23 NOTE — Telephone Encounter (Signed)
-----   Message from Debra Nguyen sent at 08/23/2023  2:14 PM EDT ----- Her potassium is slightly low at 3.3. Will send in a one time dose and she needs to increase intake of potassium rich foods but not in excess. ----- Message ----- From: Rebecka, [facility] [place] Sent: 08/23/2023  12:32 PM EDT To: Debra JONETTA Epps, NP

## 2023-08-23 NOTE — Telephone Encounter (Signed)
 Attempted to reach patient to relay lab results. Left message requesting call back to [phone].   Patient returned office call. Relayed message from [name], NP that patient's potassium is slightly low at 3.3. A one time Rx for potassium has been sent in to patient's pharmacy. Also advised patient to increase her intake of potassium rich foods but not in excess. Foods high in potassium reviewed with patient. Pt states it's not unusual for her potassium to run on the slightly lower side because she takes hydrodiuril  every morning. Pt verbalized understanding and thanked the office for calling.

## 2023-08-24 LAB — CA 125: Cancer Antigen (CA) 125: 53.3 U/mL — ABNORMAL HIGH (ref 0.0–38.1)

## 2023-08-25 ENCOUNTER — Telehealth: Payer: Self-pay

## 2023-08-25 NOTE — Telephone Encounter (Signed)
 Patient voiced understanding, no further questions.

## 2023-08-25 NOTE — Telephone Encounter (Signed)
-----   Message from Olam Ned sent at 08/25/2023 11:28 AM EDT ----- Please let her know the CA125 is lower.  Follow-up as scheduled.

## 2023-08-28 ENCOUNTER — Other Ambulatory Visit (HOSPITAL_BASED_OUTPATIENT_CLINIC_OR_DEPARTMENT_OTHER): Payer: Self-pay

## 2023-08-30 NOTE — Progress Notes (Signed)
 Anesthesia Chart Review   Case: [id number] Date/Time: 09/01/23 1215   Procedures:      LAPAROSCOPY, DIAGNOSTIC - DIAGNOSTIC LAPAROSCOPY, OPEN BSO, DEBULKING INCLUDING BOWEL SURGERY     SALPINGO-OOPHORECTOMY, OPEN     DEBULKING, ABDOMINAL     COLECTOMY, SIGMOID, OPEN   Anesthesia type: General   Diagnosis: Gynecologic cancer (HCC) [C57.9]   Pre-op diagnosis: Gynecologic cancer   Location: [facility] ROOM 05 / [facility]   Surgeons: Viktoria Comer SAUNDERS, MD       DISCUSSION:68 y.o. never smoker with h/o HTN, CKD, gynecologic cancer scheduled for above procedure 09/01/2023 with Dr. Comer Viktoria.   Pt completed cycle 4 Taxol /carboplatin  08/04/2023. Port in place. Now planning for plan for diagnostic laparoscopy, open bilateral salpingo-oophorectomy, tumor debulking including bowel resection, possible laparotomy.   CT Cardiac Scoring 06/19/2020 with calcium  score of 32.3. On statin therapy. Pt reports she can climb a flight of stairs without difficulty, no shortness of breath or chest pain. EKG 08/23/2023 NSR.   H/o ACDF C5-6, C6-7 2014.   H/o L 4-5 laminectomy 2016.  VS: BP (!) 152/82 Comment: right arm sitting  Pulse 96   Temp 37.1 C (Oral)   Resp 16   Ht 5' 7 (1.702 m)   Wt 61.7 kg   LMP 02/02/2011   SpO2 98%   BMI 21.30 kg/m   PROVIDERS: Vernon Velna SAUNDERS, MD is PCP    LABS: Labs reviewed: Acceptable for surgery. (all labs ordered are listed, but only abnormal results are displayed)  Labs Reviewed  CBC WITH DIFFERENTIAL/PLATELET - Abnormal; Notable for the following components:      Result Value   RDW 19.4 (*)    All other components within normal limits  COMPREHENSIVE METABOLIC PANEL WITH GFR - Abnormal; Notable for the following components:   Potassium 3.3 (*)    Glucose, Bld 126 (*)    All other components within normal limits  TYPE AND SCREEN     IMAGES:   EKG:   CV:  Past Medical History:  Diagnosis Date   Allergy    Anxiety    Through divorce   Arthritis     ASCUS with positive high risk HPV 12/2006   Negative Colpo BX   ASCUS with positive high risk HPV 2009   Chronic kidney disease    kidney stones   CIN III (cervical intraepithelial neoplasia III) 07/1983   tx'd w/cryo- no subsequent abnormal paps until 2008   Essential hypertension    Family history of colon cancer    Fibroid 2006   3 cm Right Fundal Fibroid   Genetic testing 07/15/2023   Negative genetic testing on the CancerNext-Expanded+RNAinsight panel.  The report date is Jul 14, 2023.  The CancerNext-Expanded gene panel offered by [facility] and includes sequencing, rearrangement, and RNA analysis for the following 77 genes: AIP, ALK, APC, ATM, BAP1, BARD1, BMPR1A, BRCA1, BRCA2, BRIP1, CDC73, CDH1, CDK4, CDKN1B, CDKN2A, CEBPA, CHEK2, CTNNA1, DDX41, DICER1, ETV6, FH, FL*   GERD (gastroesophageal reflux disease) 2011   History of kidney stones    Hypertension    Osteopenia 2020   Synovial cyst of lumbar spine 10/2014   Vitamin D  deficiency     Past Surgical History:  Procedure Laterality Date   ABDOMINAL HYSTERECTOMY  02/23/2011   R TLH-fibroids, adenomyosis 221 g, & focal CIN I w/clear margins   ANTERIOR CERVICAL DECOMP/DISCECTOMY FUSION N/A 07/19/2012   Procedure: ANTERIOR CERVICAL DECOMPRESSION/DISCECTOMY FUSION 2 LEVELS;  Surgeon: Fairy Levels, MD;  Location[facility]  NEURO ORS;  Service: Neurosurgery;  Laterality: N/A;  Anterior Cervical Five-Six Cervical Six-Seven Anterior Cervical Decompression/Diskectomy/Fusion   cataracts Bilateral 03/2022   COLONOSCOPY     CYSTOSCOPY  02/23/2011   PT.STATES SHE DID NOT HAVE   dental implants     IR IMAGING GUIDED PORT INSERTION  05/31/2023   LASIK  '99   LUMBAR LAMINECTOMY/DECOMPRESSION MICRODISCECTOMY Right 02/05/2015   Procedure: Right Lumbar four-five Laminectomy with Resection of Synovial Cyst ;  Surgeon: Fairy Levels, MD;  Location: [facility] NEURO ORS;  Service: Neurosurgery;  Laterality: Right;   MOHS SURGERY     x 2   MOLE  REMOVAL  2023   REPLACEMENT TOTAL KNEE Right 09/2021   TONSILLECTOMY AND ADENOIDECTOMY     VAGINAL DELIVERY  '92 '94    MEDICATIONS:  ALPRAZolam  (XANAX ) 0.25 MG tablet   [START ON 08/31/2023] bisacodyl  (DULCOLAX) 5 MG EC tablet   Calcium  Carb-Cholecalciferol (CALCIUM  600+D) 600-10 MG-MCG TABS   doxylamine , Sleep, (UNISOM ) 25 MG tablet   hydrochlorothiazide  (HYDRODIURIL ) 12.5 MG tablet   lidocaine -prilocaine  (EMLA ) cream   losartan  (COZAAR ) 25 MG tablet   [START ON 08/31/2023] metroNIDAZOLE  (FLAGYL ) 500 MG tablet   metroNIDAZOLE  (METROCREAM ) 0.75 % cream   Multiple Vitamins-Minerals (MULTIVITAMINS THER. W/MINERALS) TABS   ondansetron  (ZOFRAN -ODT) 4 MG disintegrating tablet   polyethylene glycol (MIRALAX  / GLYCOLAX ) 17 g packet   [START ON 08/31/2023] polyethylene glycol powder (MIRALAX ) 17 GM/SCOOP powder   potassium chloride  SA (KLOR-CON  M) 20 MEQ tablet   prochlorperazine  (COMPAZINE ) 10 MG tablet   rosuvastatin  (CRESTOR ) 5 MG tablet   traMADol  (ULTRAM ) 50 MG tablet   zolpidem  (AMBIEN ) 10 MG tablet   No current facility-administered medications for this encounter.       Harlene Hoots Ward, PA-C [facility] [phone]

## 2023-08-31 ENCOUNTER — Telehealth: Payer: Self-pay | Admitting: *Deleted

## 2023-08-31 NOTE — Telephone Encounter (Signed)
 Telephone call to check on pre-operative status.  Patient compliant with pre-operative instructions and all bowel prep instructions. Clear liquids until 0915. Patient to arrive at 1015.  No questions or concerns voiced.  Instructed to call for any needs.

## 2023-08-31 NOTE — Anesthesia Preprocedure Evaluation (Signed)
 Anesthesia Evaluation  Patient identified by MRN, date of birth, ID band Patient awake    Reviewed: Allergy & Precautions, NPO status , Patient's Chart, lab work & pertinent test results  History of Anesthesia Complications (+) PONV and history of anesthetic complications  Airway Mallampati: I  TM Distance: >3 FB Neck ROM: Full    Dental no notable dental hx. (+) Implants, Teeth Intact, Dental Advisory Given   Pulmonary neg pulmonary ROS   Pulmonary exam normal breath sounds clear to auscultation       Cardiovascular hypertension, (-) angina (-) Past MI Normal cardiovascular exam Rhythm:Regular Rate:Normal     Neuro/Psych   Anxiety      Neuromuscular disease    GI/Hepatic ,GERD  ,,  Endo/Other    Renal/GU CRFRenal diseaseLab Results      Component                Value               Date                          K                        3.3 (L)             08/23/2023                     CREATININE               0.54                08/23/2023                GFRNONAA                 >60                 08/23/2023                   GLUCOSE                  126 (H)             08/23/2023           Kidney stones   Ovarian ca    Musculoskeletal  (+) Arthritis ,  S/P ACDF C5-6, C6-7   Abdominal   Peds  Hematology Lab Results      Component                Value               Date                      WBC                      7.4                 08/23/2023                HGB                      12.2                08/23/2023                HCT  37.5                08/23/2023                MCV                      93.5                08/23/2023                PLT                      348                 08/23/2023              Anesthesia Other Findings All: Codeine, Ultram   Reproductive/Obstetrics                              Anesthesia Physical Anesthesia Plan  ASA:  3  Anesthesia Plan: General and Regional   Post-op Pain Management: Regional block*   Induction: Intravenous  PONV Risk Score and Plan: Treatment may vary due to age or medical condition, Midazolam , Dexamethasone  and Ondansetron   Airway Management Planned: Oral ETT  Additional Equipment: None  Intra-op Plan:   Post-operative Plan: Extubation in OR  Informed Consent:      Dental advisory given  Plan Discussed with:   Anesthesia Plan Comments:          Anesthesia Quick Evaluation

## 2023-09-01 ENCOUNTER — Inpatient Hospital Stay (HOSPITAL_COMMUNITY): Payer: Self-pay | Admitting: Anesthesiology

## 2023-09-01 ENCOUNTER — Inpatient Hospital Stay (HOSPITAL_COMMUNITY)
Admission: RE | Admit: 2023-09-01 | Discharge: 2023-09-09 | DRG: 330 | Disposition: A | Discharge status: Home / Self Care | Source: Ambulatory Visit | Attending: Gynecologic Oncology | Admitting: Gynecologic Oncology

## 2023-09-01 ENCOUNTER — Telehealth: Admitting: Gynecologic Oncology

## 2023-09-01 ENCOUNTER — Telehealth: Payer: Self-pay | Admitting: *Deleted

## 2023-09-01 ENCOUNTER — Inpatient Hospital Stay (HOSPITAL_COMMUNITY): Payer: Self-pay | Admitting: Physician Assistant

## 2023-09-01 ENCOUNTER — Other Ambulatory Visit: Payer: Self-pay

## 2023-09-01 ENCOUNTER — Encounter (HOSPITAL_COMMUNITY): Payer: Self-pay | Admitting: Gynecologic Oncology

## 2023-09-01 ENCOUNTER — Encounter (HOSPITAL_COMMUNITY)
Admission: RE | Disposition: A | Discharge status: Home / Self Care | Payer: Self-pay | Source: Ambulatory Visit | Attending: Gynecologic Oncology

## 2023-09-01 DIAGNOSIS — Z90710 Acquired absence of both cervix and uterus: Secondary | ICD-10-CM

## 2023-09-01 DIAGNOSIS — K660 Peritoneal adhesions (postprocedural) (postinfection): Secondary | ICD-10-CM | POA: Diagnosis not present

## 2023-09-01 DIAGNOSIS — C561 Malignant neoplasm of right ovary: Principal | ICD-10-CM | POA: Diagnosis present

## 2023-09-01 DIAGNOSIS — Z87442 Personal history of urinary calculi: Secondary | ICD-10-CM | POA: Diagnosis not present

## 2023-09-01 DIAGNOSIS — Z86001 Personal history of in-situ neoplasm of cervix uteri: Secondary | ICD-10-CM | POA: Diagnosis not present

## 2023-09-01 DIAGNOSIS — R1900 Intra-abdominal and pelvic swelling, mass and lump, unspecified site: Secondary | ICD-10-CM | POA: Diagnosis present

## 2023-09-01 DIAGNOSIS — Z800 Family history of malignant neoplasm of digestive organs: Secondary | ICD-10-CM | POA: Diagnosis not present

## 2023-09-01 DIAGNOSIS — C569 Malignant neoplasm of unspecified ovary: Secondary | ICD-10-CM | POA: Diagnosis not present

## 2023-09-01 DIAGNOSIS — C772 Secondary and unspecified malignant neoplasm of intra-abdominal lymph nodes: Secondary | ICD-10-CM

## 2023-09-01 DIAGNOSIS — C785 Secondary malignant neoplasm of large intestine and rectum: Secondary | ICD-10-CM | POA: Diagnosis not present

## 2023-09-01 DIAGNOSIS — C786 Secondary malignant neoplasm of retroperitoneum and peritoneum: Secondary | ICD-10-CM | POA: Diagnosis not present

## 2023-09-01 DIAGNOSIS — I10 Essential (primary) hypertension: Secondary | ICD-10-CM | POA: Diagnosis present

## 2023-09-01 DIAGNOSIS — G8918 Other acute postprocedural pain: Secondary | ICD-10-CM

## 2023-09-01 DIAGNOSIS — Z8249 Family history of ischemic heart disease and other diseases of the circulatory system: Secondary | ICD-10-CM

## 2023-09-01 DIAGNOSIS — K567 Ileus, unspecified: Secondary | ICD-10-CM | POA: Diagnosis present

## 2023-09-01 DIAGNOSIS — C579 Malignant neoplasm of female genital organ, unspecified: Secondary | ICD-10-CM | POA: Diagnosis present

## 2023-09-01 DIAGNOSIS — C577 Malignant neoplasm of other specified female genital organs: Secondary | ICD-10-CM | POA: Diagnosis not present

## 2023-09-01 DIAGNOSIS — Z96651 Presence of right artificial knee joint: Secondary | ICD-10-CM | POA: Diagnosis present

## 2023-09-01 DIAGNOSIS — C5701 Malignant neoplasm of right fallopian tube: Secondary | ICD-10-CM | POA: Diagnosis not present

## 2023-09-01 DIAGNOSIS — E7800 Pure hypercholesterolemia, unspecified: Secondary | ICD-10-CM

## 2023-09-01 DIAGNOSIS — C7963 Secondary malignant neoplasm of bilateral ovaries: Secondary | ICD-10-CM

## 2023-09-01 DIAGNOSIS — K682 Retroperitoneal fibrosis: Secondary | ICD-10-CM | POA: Diagnosis not present

## 2023-09-01 DIAGNOSIS — Z801 Family history of malignant neoplasm of trachea, bronchus and lung: Secondary | ICD-10-CM

## 2023-09-01 DIAGNOSIS — E876 Hypokalemia: Secondary | ICD-10-CM

## 2023-09-01 DIAGNOSIS — F419 Anxiety disorder, unspecified: Secondary | ICD-10-CM | POA: Diagnosis present

## 2023-09-01 DIAGNOSIS — C7911 Secondary malignant neoplasm of bladder: Secondary | ICD-10-CM | POA: Diagnosis not present

## 2023-09-01 DIAGNOSIS — D630 Anemia in neoplastic disease: Secondary | ICD-10-CM | POA: Diagnosis present

## 2023-09-01 DIAGNOSIS — Z79899 Other long term (current) drug therapy: Secondary | ICD-10-CM | POA: Diagnosis not present

## 2023-09-01 DIAGNOSIS — R32 Unspecified urinary incontinence: Secondary | ICD-10-CM | POA: Diagnosis present

## 2023-09-01 DIAGNOSIS — C5700 Malignant neoplasm of unspecified fallopian tube: Secondary | ICD-10-CM | POA: Diagnosis present

## 2023-09-01 HISTORY — PX: SALPINGOOPHORECTOMY: SHX82

## 2023-09-01 HISTORY — PX: LAPAROSCOPY: SHX197

## 2023-09-01 LAB — TYPE AND SCREEN
ABO/RH(D): O POS
Antibody Screen: NEGATIVE

## 2023-09-01 LAB — BASIC METABOLIC PANEL WITH GFR
Anion gap: 12 (ref 5–15)
BUN: 7 mg/dL — ABNORMAL LOW (ref 8–23)
CO2: 23 mmol/L (ref 22–32)
Calcium: 9.7 mg/dL (ref 8.9–10.3)
Chloride: 105 mmol/L (ref 98–111)
Creatinine, Ser: 0.74 mg/dL (ref 0.44–1.00)
GFR, Estimated: >60 mL/min (ref >60)
Glucose, Bld: 109 mg/dL — ABNORMAL HIGH (ref 70–99)
Potassium: 3.3 mmol/L — ABNORMAL LOW (ref 3.5–5.1)
Sodium: 140 mmol/L (ref 135–145)

## 2023-09-01 LAB — ABO/RH: ABO/RH(D): O POS

## 2023-09-01 SURGERY — LAPAROSCOPY, DIAGNOSTIC
Anesthesia: Regional | Site: Abdomen

## 2023-09-01 MED ORDER — ENSURE PRE-SURGERY PO LIQD: 296.0000 mL | Freq: Once | ORAL | Status: DC

## 2023-09-01 MED ORDER — ACETAMINOPHEN 10 MG/ML IV SOLN
INTRAVENOUS | Status: AC
  Filled 2023-09-01: qty 100

## 2023-09-01 MED ORDER — LACTATED RINGERS IV SOLN: INTRAVENOUS | Status: DC

## 2023-09-01 MED ORDER — ALBUMIN HUMAN 5 % IV SOLN: INTRAVENOUS | Status: DC | PRN

## 2023-09-01 MED ORDER — OXYCODONE HCL 5 MG PO TABS: 5.0000 mg | ORAL_TABLET | Freq: Once | ORAL | Status: DC | PRN

## 2023-09-01 MED ORDER — CHLORHEXIDINE GLUCONATE 0.12 % MT SOLN: 15.0000 mL | Freq: Once | OROMUCOSAL | Status: DC

## 2023-09-01 MED ORDER — SODIUM CHLORIDE 0.9 % IV SOLN
2.0000 g | INTRAVENOUS | Status: AC
  Administered 2023-09-01: 2 g via INTRAVENOUS
  Filled 2023-09-01: qty 2

## 2023-09-01 MED ORDER — HYDROMORPHONE HCL 2 MG PO TABS: 2.0000 mg | ORAL_TABLET | ORAL | Status: DC | PRN

## 2023-09-01 MED ORDER — ENOXAPARIN SODIUM 40 MG/0.4ML IJ SOSY
40.0000 mg | PREFILLED_SYRINGE | INTRAMUSCULAR | Status: DC
  Administered 2023-09-02 – 2023-09-09 (×8): 40 mg via SUBCUTANEOUS
  Filled 2023-09-01 (×8): qty 0.4

## 2023-09-01 MED ORDER — BUPIVACAINE HCL (PF) 0.25 % IJ SOLN
INTRAMUSCULAR | Status: AC
  Filled 2023-09-01: qty 30

## 2023-09-01 MED ORDER — ACETAMINOPHEN 500 MG PO TABS
1000.0000 mg | ORAL_TABLET | Freq: Four times a day (QID) | ORAL | Status: DC
  Administered 2023-09-01 – 2023-09-04 (×12): 1000 mg via ORAL
  Filled 2023-09-01 (×12): qty 2

## 2023-09-01 MED ORDER — SUGAMMADEX SODIUM 200 MG/2ML IV SOLN
INTRAVENOUS | Status: DC | PRN
  Administered 2023-09-01: 120 mg via INTRAVENOUS

## 2023-09-01 MED ORDER — ROCURONIUM BROMIDE 10 MG/ML (PF) SYRINGE
PREFILLED_SYRINGE | INTRAVENOUS | Status: AC
Start: 2023-09-01 — End: 2023-09-01
  Filled 2023-09-01: qty 10

## 2023-09-01 MED ORDER — APREPITANT 40 MG PO CAPS
40.0000 mg | ORAL_CAPSULE | Freq: Once | ORAL | Status: AC
  Administered 2023-09-01: 40 mg via ORAL
  Filled 2023-09-01: qty 1

## 2023-09-01 MED ORDER — HEPARIN SODIUM (PORCINE) 5000 UNIT/ML IJ SOLN
5000.0000 [IU] | INTRAMUSCULAR | Status: AC
  Administered 2023-09-01: 5000 [IU] via SUBCUTANEOUS
  Filled 2023-09-01: qty 1

## 2023-09-01 MED ORDER — HYDROMORPHONE HCL 1 MG/ML IJ SOLN
INTRAMUSCULAR | Status: DC | PRN
  Administered 2023-09-01: .5 mg via INTRAVENOUS

## 2023-09-01 MED ORDER — DEXMEDETOMIDINE HCL IN NACL 80 MCG/20ML IV SOLN
INTRAVENOUS | Status: DC | PRN
Start: 2023-09-01 — End: 2023-09-01
  Administered 2023-09-01: 8 ug via INTRAVENOUS
  Administered 2023-09-01 (×2): 4 ug via INTRAVENOUS

## 2023-09-01 MED ORDER — HEMOSTATIC AGENTS (NO CHARGE) OPTIME
TOPICAL | Status: DC | PRN
  Administered 2023-09-01: 1 via TOPICAL

## 2023-09-01 MED ORDER — PROPOFOL 10 MG/ML IV BOLUS
INTRAVENOUS | Status: AC
  Filled 2023-09-01: qty 20

## 2023-09-01 MED ORDER — PHENYLEPHRINE 80 MCG/ML (10ML) SYRINGE FOR IV PUSH (FOR BLOOD PRESSURE SUPPORT)
PREFILLED_SYRINGE | INTRAVENOUS | Status: DC | PRN
Start: 2023-09-01 — End: 2023-09-01
  Administered 2023-09-01 (×2): 80 ug via INTRAVENOUS

## 2023-09-01 MED ORDER — BUPIVACAINE LIPOSOME 1.3 % IJ SUSP
INTRAMUSCULAR | Status: DC | PRN
Start: 2023-09-01 — End: 2023-09-01
  Administered 2023-09-01 (×2): 10 mL

## 2023-09-01 MED ORDER — HYDROMORPHONE HCL 1 MG/ML IJ SOLN
0.2500 mg | INTRAMUSCULAR | Status: DC | PRN
  Administered 2023-09-01 – 2023-09-02 (×7): 0.5 mg via INTRAVENOUS
  Filled 2023-09-01 (×7): qty 0.5

## 2023-09-01 MED ORDER — ACETAMINOPHEN 500 MG PO TABS
1000.0000 mg | ORAL_TABLET | ORAL | Status: AC
  Administered 2023-09-01: 1000 mg via ORAL
  Filled 2023-09-01: qty 2

## 2023-09-01 MED ORDER — AMISULPRIDE (ANTIEMETIC) 5 MG/2ML IV SOLN
10.0000 mg | Freq: Once | INTRAVENOUS | Status: AC | PRN
  Administered 2023-09-01: 10 mg via INTRAVENOUS

## 2023-09-01 MED ORDER — FENTANYL CITRATE (PF) 250 MCG/5ML IJ SOLN
INTRAMUSCULAR | Status: DC | PRN
  Administered 2023-09-01 (×2): 50 ug via INTRAVENOUS
  Administered 2023-09-01: 25 ug via INTRAVENOUS
  Administered 2023-09-01: 50 ug via INTRAVENOUS
  Administered 2023-09-01: 25 ug via INTRAVENOUS

## 2023-09-01 MED ORDER — FENTANYL CITRATE (PF) 250 MCG/5ML IJ SOLN
INTRAMUSCULAR | Status: AC
  Filled 2023-09-01: qty 5

## 2023-09-01 MED ORDER — KCL IN DEXTROSE-NACL 20-5-0.45 MEQ/L-%-% IV SOLN
INTRAVENOUS | Status: AC
  Filled 2023-09-01 (×2): qty 1000

## 2023-09-01 MED ORDER — DEXAMETHASONE SODIUM PHOSPHATE 10 MG/ML IJ SOLN
INTRAMUSCULAR | Status: DC | PRN
  Administered 2023-09-01: 10 mg via INTRAVENOUS

## 2023-09-01 MED ORDER — DEXAMETHASONE SODIUM PHOSPHATE 10 MG/ML IJ SOLN: 4.0000 mg | INTRAMUSCULAR | Status: DC

## 2023-09-01 MED ORDER — AMISULPRIDE (ANTIEMETIC) 5 MG/2ML IV SOLN
INTRAVENOUS | Status: AC
  Filled 2023-09-01: qty 2

## 2023-09-01 MED ORDER — HYDROMORPHONE HCL 1 MG/ML IJ SOLN
0.2500 mg | INTRAMUSCULAR | Status: DC | PRN
  Administered 2023-09-01 (×3): 0.5 mg via INTRAVENOUS

## 2023-09-01 MED ORDER — HYDROMORPHONE HCL 2 MG/ML IJ SOLN
INTRAMUSCULAR | Status: AC
  Filled 2023-09-01: qty 1

## 2023-09-01 MED ORDER — ONDANSETRON HCL 4 MG PO TABS: 4.0000 mg | ORAL_TABLET | Freq: Four times a day (QID) | ORAL | Status: DC | PRN

## 2023-09-01 MED ORDER — ONDANSETRON HCL 4 MG/2ML IJ SOLN
INTRAMUSCULAR | Status: AC
  Filled 2023-09-01: qty 2

## 2023-09-01 MED ORDER — LIDOCAINE HCL (PF) 2 % IJ SOLN
INTRAMUSCULAR | Status: AC
  Filled 2023-09-01: qty 5

## 2023-09-01 MED ORDER — DEXAMETHASONE SODIUM PHOSPHATE 10 MG/ML IJ SOLN
INTRAMUSCULAR | Status: AC
  Filled 2023-09-01: qty 1

## 2023-09-01 MED ORDER — SPY AGENT GREEN - (INDOCYANINE FOR INJECTION)
INTRAMUSCULAR | Status: DC | PRN
  Administered 2023-09-01: 2.5 mg via INTRAVENOUS

## 2023-09-01 MED ORDER — ORAL CARE MOUTH RINSE: 15.0000 mL | Freq: Once | OROMUCOSAL | Status: DC

## 2023-09-01 MED ORDER — PHENYLEPHRINE HCL-NACL 20-0.9 MG/250ML-% IV SOLN
INTRAVENOUS | Status: AC
  Filled 2023-09-01: qty 250

## 2023-09-01 MED ORDER — ROCURONIUM BROMIDE 10 MG/ML (PF) SYRINGE
PREFILLED_SYRINGE | INTRAVENOUS | Status: DC | PRN
  Administered 2023-09-01: 20 mg via INTRAVENOUS
  Administered 2023-09-01: 40 mg via INTRAVENOUS
  Administered 2023-09-01 (×3): 20 mg via INTRAVENOUS

## 2023-09-01 MED ORDER — PROPOFOL 500 MG/50ML IV EMUL
INTRAVENOUS | Status: DC | PRN
Start: 2023-09-01 — End: 2023-09-01
  Administered 2023-09-01: 25 ug/kg/min via INTRAVENOUS
  Administered 2023-09-01: 120 mg via INTRAVENOUS

## 2023-09-01 MED ORDER — ENSURE PRE-SURGERY PO LIQD: 592.0000 mL | Freq: Once | ORAL | Status: DC

## 2023-09-01 MED ORDER — ONDANSETRON HCL 4 MG/2ML IJ SOLN
INTRAMUSCULAR | Status: DC | PRN
  Administered 2023-09-01: 4 mg via INTRAVENOUS

## 2023-09-01 MED ORDER — 0.9 % SODIUM CHLORIDE (POUR BTL) OPTIME
TOPICAL | Status: DC | PRN
  Administered 2023-09-01: 4000 mL

## 2023-09-01 MED ORDER — HYDROMORPHONE HCL 1 MG/ML IJ SOLN
INTRAMUSCULAR | Status: AC
  Filled 2023-09-01: qty 1

## 2023-09-01 MED ORDER — PHENYLEPHRINE HCL-NACL 20-0.9 MG/250ML-% IV SOLN
INTRAVENOUS | Status: DC | PRN
  Administered 2023-09-01: 25 ug/min via INTRAVENOUS

## 2023-09-01 MED ORDER — ONDANSETRON HCL 4 MG/2ML IJ SOLN
4.0000 mg | Freq: Four times a day (QID) | INTRAMUSCULAR | Status: DC | PRN
  Administered 2023-09-02: 4 mg via INTRAVENOUS
  Filled 2023-09-01: qty 2

## 2023-09-01 MED ORDER — MIDAZOLAM HCL 2 MG/2ML IJ SOLN
INTRAMUSCULAR | Status: AC
  Filled 2023-09-01: qty 2

## 2023-09-01 MED ORDER — POVIDONE-IODINE 10 % EX SWAB: 2.0000 | Freq: Once | CUTANEOUS | Status: DC

## 2023-09-01 MED ORDER — ONDANSETRON HCL 4 MG/2ML IJ SOLN
4.0000 mg | Freq: Once | INTRAMUSCULAR | Status: AC | PRN
  Administered 2023-09-01: 4 mg via INTRAVENOUS

## 2023-09-01 MED ORDER — FENTANYL CITRATE PF 50 MCG/ML IJ SOSY
50.0000 ug | PREFILLED_SYRINGE | Freq: Once | INTRAMUSCULAR | Status: DC
  Filled 2023-09-01: qty 2

## 2023-09-01 MED ORDER — MIDAZOLAM HCL 2 MG/2ML IJ SOLN
1.0000 mg | Freq: Once | INTRAMUSCULAR | Status: AC
  Administered 2023-09-01 (×2): 2 mg via INTRAVENOUS
  Filled 2023-09-01: qty 2

## 2023-09-01 MED ORDER — ACETAMINOPHEN 10 MG/ML IV SOLN: 1000.0000 mg | Freq: Once | INTRAVENOUS | Status: DC | PRN

## 2023-09-01 MED ORDER — BUPIVACAINE HCL (PF) 0.25 % IJ SOLN
INTRAMUSCULAR | Status: DC | PRN
Start: 2023-09-01 — End: 2023-09-01
  Administered 2023-09-01 (×2): 20 mL

## 2023-09-01 MED ORDER — LIDOCAINE HCL (PF) 2 % IJ SOLN
INTRAMUSCULAR | Status: DC | PRN
  Administered 2023-09-01: 80 mg via INTRADERMAL

## 2023-09-01 MED ORDER — OXYCODONE HCL 5 MG/5ML PO SOLN: 5.0000 mg | Freq: Once | ORAL | Status: DC | PRN

## 2023-09-01 SURGICAL SUPPLY — 85 items
ATTRACTOMAT 16X20 MAGNETIC DRP (DRAPES) IMPLANT
BAG COUNTER SPONGE SURGICOUNT (BAG) IMPLANT
BLADE EXTENDED COATED 6.5IN (ELECTRODE) ×2 IMPLANT
CABLE HIGH FREQUENCY MONO STRZ (ELECTRODE) IMPLANT
CHLORAPREP W/TINT 26 (MISCELLANEOUS) ×1 IMPLANT
CLIP TI LARGE 6 (CLIP) ×1 IMPLANT
CLIP TI MEDIUM 6 (CLIP) ×1 IMPLANT
CLIP TI MEDIUM LARGE 6 (CLIP) ×1 IMPLANT
CNTNR URN SCR LID CUP LEK RST (MISCELLANEOUS) IMPLANT
COVER MAYO STAND STRL (DRAPES) ×2 IMPLANT
COVER SURGICAL LIGHT HANDLE (MISCELLANEOUS) ×3 IMPLANT
DERMABOND ADVANCED .7 DNX12 (GAUZE/BANDAGES/DRESSINGS) ×2 IMPLANT
DRAIN CHANNEL 19F RND (DRAIN) ×1 IMPLANT
DRAPE SHEET LG 3/4 BI-LAMINATE (DRAPES) ×1 IMPLANT
DRAPE SURG IRRIG POUCH 19X23 (DRAPES) ×2 IMPLANT
DRAPE WARM FLUID 44X44 (DRAPES) ×2 IMPLANT
DRSG OPSITE POSTOP 4X10 (GAUZE/BANDAGES/DRESSINGS) IMPLANT
DRSG OPSITE POSTOP 4X12 (GAUZE/BANDAGES/DRESSINGS) ×1 IMPLANT
DRSG OPSITE POSTOP 4X6 (GAUZE/BANDAGES/DRESSINGS) IMPLANT
DRSG OPSITE POSTOP 4X8 (GAUZE/BANDAGES/DRESSINGS) IMPLANT
ELECT PENCIL ROCKER SW 15FT (MISCELLANEOUS) ×2 IMPLANT
ELECT REM PT RETURN 15FT ADLT (MISCELLANEOUS) ×2 IMPLANT
GAUZE 4X4 16PLY ~~LOC~~+RFID DBL (SPONGE) ×2 IMPLANT
GLOVE BIO SURGEON STRL SZ 6 (GLOVE) ×4 IMPLANT
GLOVE BIO SURGEON STRL SZ 6.5 (GLOVE) ×4 IMPLANT
GOWN STRL REUS W/ TWL LRG LVL3 (GOWN DISPOSABLE) ×4 IMPLANT
HEMOSTAT ARISTA ABSORB 3G PWDR (HEMOSTASIS) IMPLANT
HOLDER FOLEY CATH W/STRAP (MISCELLANEOUS) IMPLANT
IRRIGATION SUCT STRKRFLW 2 WTP (MISCELLANEOUS) IMPLANT
JELLY LUBE HR FLIP 4OZ (MISCELLANEOUS) ×1 IMPLANT
KIT BASIN OR (CUSTOM PROCEDURE TRAY) ×3 IMPLANT
KIT SIGMOIDOSCOPE (SET/KITS/TRAYS/PACK) ×1 IMPLANT
KIT TURNOVER KIT A (KITS) ×2 IMPLANT
LIGASURE IMPACT 36 18CM CVD LR (INSTRUMENTS) ×1 IMPLANT
LOOP VESSEL MAXI BLUE (MISCELLANEOUS) ×1 IMPLANT
MANIPULATOR UTERINE 4.5 ZUMI (MISCELLANEOUS) IMPLANT
MARKER SKIN DUAL TIP RULER LAB (MISCELLANEOUS) ×1 IMPLANT
NDL HYPO 21X1.5 SAFETY (NEEDLE) ×2 IMPLANT
NEEDLE HYPO 21X1.5 SAFETY (NEEDLE) ×4 IMPLANT
NS IRRIG 1000ML POUR BTL (IV SOLUTION) ×4 IMPLANT
PACK GENERAL/GYN (CUSTOM PROCEDURE TRAY) ×2 IMPLANT
PAD POSITIONING PINK XL (MISCELLANEOUS) ×2 IMPLANT
RELOAD GRN CONTOUR (ENDOMECHANICALS) ×2 IMPLANT
RELOAD STAPLE 40 GRN THCK (ENDOMECHANICALS) ×1 IMPLANT
RETRACTOR WND ALEXIS 25 LRG (MISCELLANEOUS) IMPLANT
SCISSORS LAP 5X35 DISP (ENDOMECHANICALS) IMPLANT
SEALER TISSUE G2 CVD JAW 45CM (ENDOMECHANICALS) IMPLANT
SHEET LAVH (DRAPES) ×2 IMPLANT
SLEEVE Z-THREAD 5X100MM (TROCAR) ×2 IMPLANT
SOL PREP POV-IOD 4OZ 10% (MISCELLANEOUS) ×2 IMPLANT
SPIKE FLUID TRANSFER (MISCELLANEOUS) IMPLANT
SPONGE T-LAP 18X18 ~~LOC~~+RFID (SPONGE) ×5 IMPLANT
STAPLER CVD CUT GN 40 RELOAD (ENDOMECHANICALS) ×2 IMPLANT
STAPLER CVD CUT GRN 40 RELOAD (ENDOMECHANICALS) ×1 IMPLANT
STAPLER ECHELON POWER CIR 29 (STAPLE) ×1 IMPLANT
STAPLER RELOADABLE 65 2-0 SUT (MISCELLANEOUS) ×1 IMPLANT
STAPLER SKIN PROX 35W (STAPLE) ×2 IMPLANT
STAPLER SYS INTERNAL RELOAD SS (MISCELLANEOUS) ×2 IMPLANT
SURGIFLO W/THROMBIN 8M KIT (HEMOSTASIS) ×1 IMPLANT
SUT ETHILON 2 0 PS N (SUTURE) ×1 IMPLANT
SUT MNCRL AB 4-0 PS2 18 (SUTURE) ×3 IMPLANT
SUT PDS AB 1 TP1 96 (SUTURE) ×2 IMPLANT
SUT PROLENE 2 0 SH DA (SUTURE) ×1 IMPLANT
SUT VIC AB 0 CT1 27XBRD ANTBC (SUTURE) ×4 IMPLANT
SUT VIC AB 2-0 CT1 36 (SUTURE) ×2 IMPLANT
SUT VIC AB 2-0 CT1 TAPERPNT 27 (SUTURE) ×2 IMPLANT
SUT VIC AB 2-0 CT2 27 (SUTURE) ×6 IMPLANT
SUT VIC AB 2-0 SH 18 (SUTURE) ×2 IMPLANT
SUT VIC AB 2-0 SH 27X BRD (SUTURE) IMPLANT
SUT VIC AB 3-0 CTX 36 (SUTURE) IMPLANT
SUT VIC AB 3-0 SH 18 (SUTURE) IMPLANT
SUT VIC AB 3-0 SH 27X BRD (SUTURE) ×1 IMPLANT
SUT VIC AB 4-0 PS2 27 (SUTURE) IMPLANT
SYR 30ML LL (SYRINGE) ×4 IMPLANT
SYSTEM BAG RETRIEVAL 10MM (BASKET) IMPLANT
SYSTEM RETRIEVL 5MM INZII UNIV (BASKET) IMPLANT
TOWEL OR 17X26 10 PK STRL BLUE (TOWEL DISPOSABLE) ×2 IMPLANT
TRAY FOLEY MTR SLVR 16FR STAT (SET/KITS/TRAYS/PACK) ×2 IMPLANT
TRAY LAPAROSCOPIC (CUSTOM PROCEDURE TRAY) ×2 IMPLANT
TROCAR ADV FIXATION 12X100MM (TROCAR) IMPLANT
TROCAR BALLN 12MMX100 BLUNT (TROCAR) IMPLANT
TROCAR Z-THREAD OPTICAL 5X100M (TROCAR) ×2 IMPLANT
TUBING INSUFFLATION 10FT LAP (TUBING) ×1 IMPLANT
UNDERPAD 30X36 HEAVY ABSORB (UNDERPADS AND DIAPERS) ×2 IMPLANT
YANKAUER SUCT BULB TIP 10FT TU (MISCELLANEOUS) ×2 IMPLANT

## 2023-09-01 NOTE — Telephone Encounter (Signed)
-----   Message from Debra Nguyen sent at 09/01/2023  8:33 AM EDT ----- I had ordered a repeat Bmet for her given her K+ at 3.3. Her surgery is later this am. Could you make sure short stay knows to recheck this? Thanks

## 2023-09-01 NOTE — Interval H&P Note (Signed)
 History and Physical Interval Note:  09/01/2023 12:06 PM  Debra Nguyen  has presented today for surgery, with the diagnosis of Gynecologic cancer.  The various methods of treatment have been discussed with the patient and family. After consideration of risks, benefits and other options for treatment, the patient has consented to  Procedure(s) with comments: LAPAROSCOPY, DIAGNOSTIC (N/A) - DIAGNOSTIC LAPAROSCOPY, OPEN BSO, DEBULKING INCLUDING BOWEL SURGERY SALPINGO-OOPHORECTOMY, OPEN (N/A) DEBULKING, ABDOMINAL (N/A) COLECTOMY, SIGMOID, OPEN (N/A) as a surgical intervention.  The patient's history has been reviewed, patient examined, no change in status, stable for surgery.  I have reviewed the patient's chart and labs.  Questions were answered to the patient's satisfaction.     [name]

## 2023-09-01 NOTE — Op Note (Signed)
 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  Post-operative Diagnosis: same, retroperitoneal fibrosis  Operation: Diagnostic laparoscopy, conversion to exploratory laparotomy with radical tumor debulking including total omentectomy, right salpingo-oophorectomy, en bloc resection of left tube and ovary with rectosigmoid colon with rectosigmoid reanastomosis, stripping of bladder peritoneum, resection of right deep pelvic nodule, excision of implant and small bowel mesenteric implant  Surgeon: Viktoria Crank MD  Assistant Surgeon: [name], NP (an NP assistant was necessary for tissue manipulation, management of robotic instrumentation, retraction and positioning due to the complexity of the case and hospital policies).   Anesthesia: GET  Urine Output: 75 cc  Operative Findings: On diagnostic laparoscopy, normal upper abdominal survey including diaphragm, liver edge.  Omental cake adherent with filmy adhesions to the anterior abdominal wall in the mid abdomen on the left.  Otherwise, small bowel and small bowel mesentery appears normal.  Within the pelvis, sigmoid colon draped over large left-sided pelvic mass.  On laparotomy, diaphragm and liver surfaces smooth on palpation.  Stomach normal in appearance.  Several smaller areas of omental cake with treatment effect noted within the infracolic omentum, few thickened areas within the infra gastric omentum.  Lesser sac without evidence of disease.  Normal spleen.  No ascites.  Small implant on mesentery of the small bowel, implant on the descending colon.  Within the pelvis, the sigmoid colon was draped over the left adnexa that had a multiloculated cystic lesion measuring approximately 10 cm that was partially retroperitonealized and adherent to the left ureter with retroperitoneal fibrosis noted.  On the right, normal-appearing fallopian tube and ovary.  Some  fibrosis also within the right retroperitoneum.  The colon itself was somewhat redundant within the pelvis and after it was freed from the left sidewall where it was draped over the left tube and ovary, it was adherent in multiple places to the left adnexa and to a cystic lesion within the right mesorectum.  Given findings, decision made to proceed with rectosigmoid resection en bloc with the left tube and ovary.  Ultimately, given adherence of the cystic lesion to the right lateral aspect of the rectum, the rectum was taken above the peritoneal reflection but approximately 11 cm from the anal verge.  Small amount of of miliary disease noted on the bladder peritoneum, which was stripped.  There was some thickening along the deep pelvic sidewall inferior to the right ureter that was subsequently resected.  No palpable adenopathy.  Bubble test was negative. At the end of surgery, no visible or palpable disease.   Estimated Blood Loss:  325 cc      Total IV Fluids: see I&O flowsheet         Specimens: omentum, right tube and ovary, en bloc resection of left tube and ovary with rectosigmoid colon, proximal and distal donuts, deep right pelvic side wall nodule, bladder peritoneum, descending colon nodule (labeled sigmoid colon nodule), small bowel mesenteric nodule         Complications:  None apparent; patient tolerated the procedure well.         Disposition: PACU - hemodynamically stable.  Procedure Details  The patient was seen in the Holding Room. The risks, benefits, complications, treatment options, and expected outcomes were discussed with the patient.  The patient concurred with the proposed plan, giving informed consent.  The site of surgery properly noted/marked. The patient was identified as Debra Nguyen and the procedure verified as a  diagnostic laparoscopy with likely conversion to open for interval debulking surgery.   After induction of anesthesia, the patient was draped and prepped in  the usual sterile manner. Patient was placed in supine position after anesthesia and draped and prepped in the usual sterile manner as follows: Her left arm was tucked to her side with all appropriate precautions.  The patient was secured to the bed using a strap across her chest.  The patient was placed in the semi-lithotomy position in Ruth stirrups.  The perineum and vagina were prepped with Betadine . The patient's abdomen was prepped with Duraprep and then she was draped after the prep had been allowed to dry for 3 minutes.  A Time Out was held and the above information confirmed.  The urethra was prepped with Betadine . Foley catheter was placed.  OG tube placement was confirmed and to suction.   Next, a 5 mm skin incision was made 1 cm below the subcostal margin in the midclavicular line.  The 5 mm Optiview port and scope was used for direct entry.  Opening pressure was under 10 mm CO2.  The abdomen was insufflated and the findings were noted as above.   The decision was made to proceed with exploratory laparotomy.   With the abdomen still insufflated, a vertical midline incision was and carried down to the underlying fascia using Bovie cautery.  The fascia was scored in the fascial incision was extended superiorly and inferiorly using Bovie cautery.  The peritoneum was tented and entered. The peritoneal incision was extended superiorly and inferiorly with visualization of the underlying peritoneal cavity.     Attention was first turned to the omentum.  Monopolar electrocautery was used to mobilize the ascending colon and the hepatic flexure.  Similarly, the superior aspect of the descending colon was mobilized using monopolar electrocautery from the left sidewall.  Monopolar electrocautery was used to begin the dissection of the omentum off of the adjacent transverse colon.  The omentum was somewhat adherent and the plane to define the lesser sac was somewhat difficult. The lesser sac was ultimately  developed and the Bipolar electrocautery was used to cauterize and transect the superior aspect of the omentum, preserving the short gastrics.  All palpable disease within the omentum was resected during the omentectomy.  Once freed, the omentum was handed off the field.   Small bowel was then run from the cecum to the ligament of Treitz.  There was 1 nodule on the surface of the small bowel mesentery, otherwise no disease noted.  This was resected using Metzenbaum scissors.   The Bookwalter self-retaining retractor was then placed. At the initial placement as well as at several points during the case the lateral blades were checked to ensure no significant pressure on the psoas bellies.  Small bowel was packed into the upper abdomen.   Attention was first turned to the left.  The sigmoid colon was mobilized from lateral attachment to the pelvic sidewall to open the retroperitoneum.  Once the sigmoid had been mobilized up and over the left adnexal mass, the retroperitoneum was further developed using a combination of blunt dissection and short bursts of monopolar electrocautery.  Once the sigmoid colon had been mobilized medial to the adnexal mass, the infundibulopelvic ligament was identified, isolated, clamped, cauterized, and suture-ligated.  With blunt dissection, the retroperitonealized mass was mobilized off of the pelvic sidewall and the left ureter was identified.  The mass was noted to be quite adherent to the ureter.  A right  angle clamp was used to mobilize the ureter 360 and the ureter was tagged with a vessel loop.  Attention was then turned to the right.  The right pelvic sidewall peritoneum was incised and the retroperitoneum developed.  The right ureter was visualized along the medial leaf of the broad ligament.  The right infundibulopelvic ligament was isolated, clamped, cauterized, and suture-ligated.  The distal peritoneal attachment to the right ovary was lysed with monopolar  electrocautery and the right tube and ovary were handed off the field.  On the left, with continued mobilization of the left adnexal mass, there was unavoidable rupture of the mass with drainage of green-tinged fluid.  The cyst wall was then grasped with an Allis and elevated.  Using right angle clamp, the mass was carefully dissected free from the underlying ureter using blunt dissection and short bursts of monopolar electrocautery as the mass was further mobilized away from the ureter itself.  At this time, inspection of the deep pelvis found that the rectum was adherent to the bladder peritoneum and vaginal cuff.  A sponge stick was placed in the vagina to help delineate the vaginal cuff.  A combination of sharp dissection and monopolar electrocautery were used to mobilize the rectum free.  There was some superficial injury to the vaginal cuff during this dissection and in developing the rectovaginal septum.  The vaginal cuff was oversewn with several figure-of-eight stitches using 0 Vicryl.    Adjacent to rectum and within the mesorectum on the right, a 4-6 cm cystic lesion was noted.  During manipulation of the rectum, this was unavoidably drained as well, with similar colored fluid.    Decision was made to proceed with rectosigmoid resection.  Above the level of the left adnexal mass where the sigmoid colon was adherent to the inferior aspect of the mass, the sigmoid colon was transected just below the level of the pelvic brim. A defect was made in the mesentery just adjacent to the colon and a Contour stapler was used to transect the sigmoid at this level.  With both ureters visualized, the mesentery of the sigmoid colon was cauterized and transected using the LigaSure device.  With upward traction on the distal end of the transected sigmoid colon, I developed the presacral space so I was below the level of the mesorectal cystic mass.   On the left, once the mass had been freed from the left pelvic  sidewall and the left ureter, bipolar electrocautery were used to mobilize the left mesorectum.  On the right, the rectum was adherent to the right pelvic sidewall.  Given retroperitoneal fibrosis on this side, the ureter was similarly dissected free from the medial leaf of the round ligament 360 degrees intact with a vessel loop.  The ureter was then mobilized medially using upward traction on the rectum and a right angle retractor.  Monopolar electrocautery was used to mobilize the rectum free from the pelvic sidewall.  Once the rectum had been freed bilateral the and the mesocolon had been dissected to below the level of the right cystic lesion, the rectum was skeletonized using bipolar electrocautery.  The contour stapler was then used to divide the rectum approximately 11 cm from the anal verge.  The en bloc resection including left tube and ovary, rectosigmoid colon were handed off the field.  Upon inspection of the right pelvic sidewall, there was a small nodule palpated along the deep pelvic sidewall inferior to the ureter.  This was excised using monopolar electrocautery with  the ureter in view.  Some small miliary disease versus treated tumor was noted on the bladder peritoneum and a small area of bladder peritoneum was excised using monopolar electrocautery.  A figure-of-eight using 2-0 Vicryl was used to reinforce the defect.  The sigmoid colon was noted to reach the deep pelvis without any tension.  Was in part because the descending colon had been mobilized already from the lateral abdomen along the white line of Toldt.  This was irrigated and noted to be hemostatic.  Green towels were placed around the proximal sigmoid and the suture line excised.  The EEA sizers were used to measure the colonic width. A 29 mm EEA sizer was accommodated and selected.  The 29 mm EEA stapler was opened and the anvil was placed within the proximal sigmoid.  The automatic pursestring suture device was used and a  second pursestring suture was placed circumferentially with prolene to secure the anvil. The distal 2cm of this sigmoid colon margin was skeletonized free of its fatty tissue so that bare colonic tissue was exposed on the edges of the anvil. The rectal stump was inspected and skeletonized in a similar fashion. The EEA anastamosing device was inserted by the surgeon into the anus.  There was some difficulty in advancing the EEA stapler to the top of the rectal stump.  EEA sizers were progressively placed and were able to reach the apex of the rectal stump and ultimately the EEA sizer reached the stump.  The spike was deployed anterior to the transverse staple line on the rectal stump. The mesentery was confirmed to be free of the anatomosis. The EEA anastamosing stapler was closed and the sigmoid was delivered onto the rectal stump in a tension free manner. The stapler was activated and then removed.  Two intact donuts were visualized and confirmed to be intact circumferentially.  These were sent to pathology as the proximal and distal donuts.  The staple line was examined and visually intact.   The pelvis was filled with saline, the sigmoid (proximal) occluded mannually and the rectum was filled with air.  There was some question initially as to whether a bubble had been seen.  The test was repleted twice subsequently with no bubbles noted.  The suture line was visualized using the rigid proctoscope and noted to be intact and healthy in appearance circumferentially.  Surgeons gloves were changed.  Pelvis was given for all surfaces within the deep pelvis, Surgiflo placed and pressure held for several minutes with excellent hemostasis noted.    The small bowel was unpacked.  The colon was again examined and a small, less than 1 cm nodule was noted on the surface of the descending colon.  This was excised sharply with Metzenbaum scissors.  There was not a full thickness defect but the partial defect was reinforced  with 2 layers of 2-0 Vicryl in an imbricating fashion.  The LUQ incision was used for the drain.  A 19F JP drain was placed within the pelvis and nylon suture used to secure the drain to the skin.   Bookwalter retractor was removed.  Surgeon and and scrub tech gloves were all changed.  The area around the abdominal incision was redraped and clean instruments used for abdominal closure.  The fascia was closed using running mass closure of #1 PDS. The subcutaneous tissues were irrigated and made hemostatic. The subcutaneous tissue was reapproximated with 2-0 Vicryl. The skin was closed using staples.  The laparotomy incision was covered with a  honeycomb dressing.  All sponge, lap and needle counts were correct x 3.   The patient was transferred to the recovery room in stable condition.  Comer Dollar, MD

## 2023-09-01 NOTE — Anesthesia Postprocedure Evaluation (Signed)
 Anesthesia Post Note  Patient: Debra Nguyen  Procedure(s) Performed: LAPAROSCOPY, DIAGNOSTIC (Abdomen) BILATERAL SALPINGO-OOPHORECTOMY, OPEN, EX LAP WITH RADICAL TUMOR DEBULKING, OMENTECTOMY, RECTOSIGMOID RESECTION AND ANASTOMOSIS (Abdomen)     Patient location during evaluation: PACU Anesthesia Type: Regional and General Level of consciousness: awake and alert Pain management: pain level controlled Vital Signs Assessment: post-procedure vital signs reviewed and stable Respiratory status: spontaneous breathing, nonlabored ventilation and respiratory function stable Cardiovascular status: blood pressure returned to baseline and stable Postop Assessment: no apparent nausea or vomiting Anesthetic complications: no   No notable events documented.  Last Vitals:  Vitals:   09/01/23 1800 09/01/23 1815  BP: 134/73 129/72  Pulse: 86 90  Resp: 11 12  Temp:    SpO2: 98% 92%    Last Pain:  Vitals:   09/01/23 1815  TempSrc:   PainSc: Asleep                 Butler Levander Pinal

## 2023-09-01 NOTE — Transfer of Care (Signed)
 Immediate Anesthesia Transfer of Care Note  Patient: Debra Nguyen [name]  Procedure(s) Performed: LAPAROSCOPY, DIAGNOSTIC (Abdomen) BILATERAL SALPINGO-OOPHORECTOMY, OPEN, EX LAP WITH RADICAL TUMOR DEBULKING, OMENTECTOMY, RECTOSIGMOID RESECTION AND ANASTOMOSIS (Abdomen)  Patient Location: PACU  Anesthesia Type:General  Level of Consciousness: drowsy and patient cooperative  Airway & Oxygen Therapy: Patient Spontanous Breathing and Patient connected to face mask oxygen  Post-op Assessment: Report given to RN and Post -op Vital signs reviewed and stable  Post vital signs: Reviewed and stable  Last Vitals:  Vitals Value Taken Time  BP 116/64 09/01/23 17:00  Temp    Pulse 77 09/01/23 17:05  Resp 18 09/01/23 17:05  SpO2 100 % 09/01/23 17:05  Vitals shown include unfiled device data.  Last Pain:  Vitals:   09/01/23 1116  TempSrc:   PainSc: 0-No pain         Complications: No notable events documented.

## 2023-09-01 NOTE — Anesthesia Procedure Notes (Addendum)
 Anesthesia Regional Block: TAP block   Pre-Anesthetic Checklist: , timeout performed,  Correct Patient, Correct Site, Correct Laterality,  Correct Procedure, Correct Position, site marked,  Risks and benefits discussed,  At surgeon's request and post-op pain management  Laterality: Left and Upper  Prep: Dura Prep       Needles:   Needle Type: Echogenic Needle     Needle Length: 9cm  Needle Gauge: 20     Additional Needles:   Procedures:,,,, ultrasound used (permanent image in chart),,    Narrative:  Start time: 09/01/2023 12:03 PM End time: 09/01/2023 12:08 PM Injection made incrementally with aspirations every 5 mL.  Performed by: Personally  Anesthesiologist: Jefm Garnette LABOR, MD

## 2023-09-01 NOTE — Anesthesia Procedure Notes (Signed)
 Procedure Name: Intubation Date/Time: 09/01/2023 12:39 PM  Performed by: Cena Epps, CRNAPre-anesthesia Checklist: Patient identified, Emergency Drugs available, Suction available and Patient being monitored Patient Re-evaluated:Patient Re-evaluated prior to induction Oxygen Delivery Method: Circle System Utilized Preoxygenation: Pre-oxygenation with 100% oxygen Induction Type: IV induction Ventilation: Mask ventilation without difficulty Laryngoscope Size: Glidescope and 3 (limited ROM) Grade View: Grade I Tube type: Oral Tube size: 7.0 mm Number of attempts: 1 Airway Equipment and Method: Stylet and Oral airway Placement Confirmation: ETT inserted through vocal cords under direct vision, positive ETCO2 and breath sounds checked- equal and bilateral Secured at: 22 cm Tube secured with: Tape Dental Injury: Teeth and Oropharynx as per pre-operative assessment

## 2023-09-01 NOTE — Anesthesia Procedure Notes (Addendum)
 Anesthesia Regional Block: TAP block   Pre-Anesthetic Checklist: , timeout performed,  Correct Patient, Correct Site, Correct Laterality,  Correct Procedure, Correct Position, site marked,  Risks and benefits discussed,  At surgeon's request and post-op pain management  Laterality: Right and Upper  Prep: Dura Prep       Needles:  Injection technique: Single-shot  Needle Type: Echogenic Needle     Needle Length: 9cm  Needle Gauge: 20     Additional Needles:   Procedures:,,,, ultrasound used (permanent image in chart),,    Narrative:  Start time: 09/01/2023 12:08 PM End time: 09/01/2023 12:11 PM Injection made incrementally with aspirations every 5 mL.  Performed by: Personally  Anesthesiologist: Jefm Garnette LABOR, MD

## 2023-09-01 NOTE — Progress Notes (Signed)
 The patient arrived to the unit. She is alert and oriented x4. Respirations are equal and non-labored. Incision, midline is clean dry and intact. JP drain is charged, bloody drainage. Foley catheter is patent, clear yellow urine.

## 2023-09-01 NOTE — Telephone Encounter (Signed)
 Spoke with Alexa in Short Stay and relayed message from [name], NP that patient needs a repeat Bmet to check a potassium level this morning prior to her surgery. Order is in, just need to be released. Alexa has placed a note on patient's chart.

## 2023-09-01 NOTE — Brief Op Note (Signed)
 09/01/2023  4:49 PM  PATIENT:  Debra Nguyen  68 y.o. female  PRE-OPERATIVE DIAGNOSIS:  Gynecologic cancer  POST-OPERATIVE DIAGNOSIS:  Gynecologic cancer  PROCEDURE:  Procedure(s) with comments: LAPAROSCOPY, DIAGNOSTIC (N/A) - DIAGNOSTIC LAPAROSCOPY, OPEN BSO, DEBULKING INCLUDING BOWEL SURGERY BILATERAL SALPINGO-OOPHORECTOMY, OPEN, EX LAP WITH RADICAL TUMOR DEBULKING, OMENTECTOMY, RECTOSIGMOID RESECTION AND ANASTOMOSIS (N/A)  SURGEON:  Surgeons and Role:    DEWAINE Viktoria Comer JONELLE, MD - Primary  ASSISTANTS: Micheline Setter NP   ANESTHESIA:   general, TAP block  EBL:  325 mL   BLOOD ADMINISTERED: none  DRAINS: 13F Blake drain   LOCAL MEDICATIONS USED:  Exparel   SPECIMEN:  omentum, en bloc resection of left tube and ovary with rectosigmoid colon, right tube and ovary, right deep pelvic side wall nodule, sigmoid nodule, small bowel mesenteric nodule  DISPOSITION OF SPECIMEN:  PATHOLOGY  COUNTS:  YES  TOURNIQUET:  * No tourniquets in log *  DICTATION: .Note written in EPIC  PLAN OF CARE: Admit to inpatient   PATIENT DISPOSITION:  PACU - hemodynamically stable.   Delay start of Pharmacological VTE agent (>24hrs) due to surgical blood loss or risk of bleeding: no

## 2023-09-01 NOTE — Discharge Instructions (Addendum)
 AFTER SURGERY INSTRUCTIONS   Return to work: 4-6 weeks if applicable   You will need to be on a blood thinner Eliquis  after surgery to prevent blood clots for 4 weeks. PLAN TO START TAKING THIS TOMORROW September 10, 2023 IN THE AM SINCE YOU RECEIVED THE BLOOD THINNER INJECTION TODAY BEFORE YOU LEFT THE HOSPITAL.   AVOID USE OF NSAIDS (IBUPROFEN , NAPROXEN) WHILE TAKING THE BLOOD THINNER.   YOU WILL TAKE ELIQUIS  TWICE DAILY. THIS IS A BLOOD THINNER TO PREVENT BLOOD CLOTS SO YOU WILL NEED TO MONITOR FOR BLEEDING AND CALL THE OFFICE.  Monitor the mild incisional redness and call for any changes.  Activity: 1. Be up and out of the bed during the day.  Take a nap if needed.  You may walk up steps but be careful and use the hand rail.  Stair climbing will tire you more than you think, you may need to stop part way and rest.    2. No lifting or straining for 6 weeks over 10 pounds. No pushing, pulling, straining for 6 weeks.   3. No driving for around 2 weeks and when the following criteria have been met: Do not drive if you are taking narcotic pain medicine and make sure that your reaction time has returned.    4. You can shower as soon as the next day after surgery. Shower daily.  Use your regular soap and water  (not directly on the incision) and pat your incision(s) dry afterwards; don't rub.  No tub baths or submerging your body in water  until cleared by your surgeon. If you have the soap that was given to you by pre-surgical testing that was used before surgery, you do not need to use it afterwards because this can irritate your incisions.    5. No sexual activity and nothing in the vagina for 6 weeks.   6. You may experience a small amount of clear drainage from your incisions, which is normal.  If the drainage persists, increases, or changes color please call the office.   7. Do not use creams, lotions, or ointments such as neosporin on your incisions after surgery until advised by your surgeon  because they can cause removal of the dermabond glue on your incisions.     8. Take Tylenol  first for pain if you are able to take these medication and only use narcotic pain medication for severe pain not relieved by the Tylenol .  Monitor your Tylenol  intake to a max of 4,000 mg in a 24 hour period.    Diet: 1. Low sodium Heart Healthy Diet is recommended but you are cleared to resume your normal (before surgery) diet after your procedure.   2. It is safe to use a laxative, such as Miralax  or Colace, if you have difficulty moving your bowels before surgery. You can continue on your twice daily miralax . If bowel surgery is performed, this plan may be adjusted.     Wound Care: 1. Keep clean and dry.  Shower daily.   Reasons to call the Doctor: Fever - Oral temperature greater than 100.4 degrees Fahrenheit Foul-smelling vaginal discharge Difficulty urinating Nausea and vomiting Increased pain at the site of the incision that is unrelieved with pain medicine. Difficulty breathing with or without chest pain New calf pain especially if only on one side Sudden, continuing increased vaginal bleeding with or without clots.   Contacts: For questions or concerns you should contact:   Dr. Comer Dollar at [phone]   [facility],  NP at [phone]   After Hours: call [phone] and have the GYN Oncologist paged/contacted (after 5 pm or on the weekends). You will speak with an after hours RN and let he or she know you have had surgery.   Messages sent via mychart are for non-urgent matters and are not responded to after hours so for urgent needs, please call the after hours number.  Information on my medicine - ELIQUIS  (apixaban )  This medication education was reviewed with me or my healthcare representative as part of my discharge preparation.   Why was Eliquis  prescribed for you? Eliquis  was prescribed for you to reduce the risk of forming blood clots that can cause a stroke  if you have a medical condition called atrial fibrillation (a type of irregular heartbeat) OR to reduce the risk of a blood clots forming after orthopedic surgery.  What do You need to know about Eliquis  ? Take your Eliquis  TWICE DAILY - one tablet in the morning and one tablet in the evening with or without food.  It would be best to take the doses about the same time each day.  If you have difficulty swallowing the tablet whole please discuss with your pharmacist how to take the medication safely.  Take Eliquis  exactly as prescribed by your doctor and DO NOT stop taking Eliquis  without talking to the doctor who prescribed the medication.  Stopping may increase your risk of developing a new clot or stroke.  Refill your prescription before you run out.  After discharge, you should have regular check-up appointments with your healthcare provider that is prescribing your Eliquis .  In the future your dose may need to be changed if your kidney function or weight changes by a significant amount or as you get older.  What do you do if you miss a dose? If you miss a dose, take it as soon as you remember on the same day and resume taking twice daily.  Do not take more than one dose of ELIQUIS  at the same time.  Important Safety Information A possible side effect of Eliquis  is bleeding. You should call your healthcare provider right away if you experience any of the following: Bleeding from an injury or your nose that does not stop. Unusual colored urine (red or dark brown) or unusual colored stools (red or black). Unusual bruising for unknown reasons. A serious fall or if you hit your head (even if there is no bleeding).  Some medicines may interact with Eliquis  and might increase your risk of bleeding or clotting while on Eliquis . To help avoid this, consult your healthcare provider or pharmacist prior to using any new prescription or non-prescription medications, including herbals, vitamins,  non-steroidal anti-inflammatory drugs (NSAIDs) and supplements.  This website has more information on Eliquis  (apixaban ): www.Eliquis .com.

## 2023-09-01 NOTE — Consult Note (Signed)
 Consult requested for presurgical stoma site marking.  This was already performed on 7/7, refer to previous consult notes, and WOC team will follow after surgery if the patient receives an ostomy. Thank-you,  Stephane Fought MSN, RN, CWOCN, CWCN-AP, CNS Contact Mon-Fri 0700-1500: [phone]

## 2023-09-02 ENCOUNTER — Encounter (HOSPITAL_COMMUNITY): Payer: Self-pay | Admitting: Gynecologic Oncology

## 2023-09-02 LAB — BASIC METABOLIC PANEL WITH GFR
Anion gap: 6 (ref 5–15)
BUN: 7 mg/dL — ABNORMAL LOW (ref 8–23)
CO2: 27 mmol/L (ref 22–32)
Calcium: 8.6 mg/dL — ABNORMAL LOW (ref 8.9–10.3)
Chloride: 104 mmol/L (ref 98–111)
Creatinine, Ser: 0.58 mg/dL (ref 0.44–1.00)
GFR, Estimated: >60 mL/min (ref >60)
Glucose, Bld: 165 mg/dL — ABNORMAL HIGH (ref 70–99)
Potassium: 3.6 mmol/L (ref 3.5–5.1)
Sodium: 137 mmol/L (ref 135–145)

## 2023-09-02 LAB — CBC
HCT: 31.2 % — ABNORMAL LOW (ref 36.0–46.0)
Hemoglobin: 10.1 g/dL — ABNORMAL LOW (ref 12.0–15.0)
MCH: 30.8 pg (ref 26.0–34.0)
MCHC: 32.4 g/dL (ref 30.0–36.0)
MCV: 95.1 fL (ref 80.0–100.0)
Platelets: 230 K/uL (ref 150–400)
RBC: 3.28 MIL/uL — ABNORMAL LOW (ref 3.87–5.11)
RDW: 17.5 % — ABNORMAL HIGH (ref 11.5–15.5)
WBC: 16 K/uL — ABNORMAL HIGH (ref 4.0–10.5)
nRBC: 0 % (ref 0.0–0.2)

## 2023-09-02 MED ORDER — DIPHENHYDRAMINE HCL 25 MG PO CAPS
25.0000 mg | ORAL_CAPSULE | Freq: Four times a day (QID) | ORAL | Status: DC | PRN
  Administered 2023-09-02 – 2023-09-05 (×5): 25 mg via ORAL
  Filled 2023-09-02 (×5): qty 1

## 2023-09-02 MED ORDER — TRAMADOL HCL 50 MG PO TABS
50.0000 mg | ORAL_TABLET | Freq: Four times a day (QID) | ORAL | Status: DC | PRN
  Administered 2023-09-02 – 2023-09-04 (×5): 50 mg via ORAL
  Filled 2023-09-02 (×5): qty 1

## 2023-09-02 MED ORDER — DOXYLAMINE SUCCINATE (SLEEP) 25 MG PO TABS
25.0000 mg | ORAL_TABLET | Freq: Every evening | ORAL | Status: DC | PRN
  Administered 2023-09-02 – 2023-09-09 (×5): 25 mg via ORAL
  Filled 2023-09-02 (×8): qty 1

## 2023-09-02 NOTE — Addendum Note (Signed)
 Addendum  created 09/02/23 1201 by Niels Marien CROME, MD   Attestation recorded in Intraprocedure, Intraprocedure Attestations filed

## 2023-09-02 NOTE — Progress Notes (Signed)
 Overnight Note:  Patient remained AAOx4  Patient tolerating clear liquids, passing gas as belches. Patient dangled on the side of the bed 2-3x. Stood at the bedside 2-3x.   Incision - midline remained clean, dry intact. JP drain - serosanguinous, scant drainage Scant vaginal discharge - Serosanguinous Foley - patent; Urine - clear, yellow   Pain management over night:  IV Dilaudid  @ 1925, 2224, 0236, 9386 Zofran  @ 0235 K-Pad used  (Pain agreeable to do PO meds more during the day on 7/17)

## 2023-09-02 NOTE — Progress Notes (Signed)
   09/02/23 1438  TOC Brief Assessment  Insurance and Status Reviewed  Patient has primary care physician Yes  Home environment has been reviewed home alone  Prior level of function: independent  Prior/Current Home Services No current home services  Social Drivers of Health Review SDOH reviewed no interventions necessary  Readmission risk has been reviewed Yes  Transition of care needs no transition of care needs at this time

## 2023-09-02 NOTE — Progress Notes (Signed)
 1 Day Post-Op Procedure(s) (LRB): LAPAROSCOPY, DIAGNOSTIC (N/A) BILATERAL SALPINGO-OOPHORECTOMY, OPEN, EX LAP WITH RADICAL TUMOR DEBULKING, OMENTECTOMY, RECTOSIGMOID RESECTION AND ANASTOMOSIS (N/A)  Subjective: Patient reports mild nausea at this time. No emesis. Had zofran  last pm. She is having intermittent mild itching. Was out of bed with assist last pm and currently sitting in the chair. Having moderate abdominal soreness. Denies chest pain, dyspnea. Has been taking deep breaths intermittently. Did not sleep well last pm.  Objective: Vital signs in last 24 hours: Temp:  [97.5 F (36.4 C)-98.5 F (36.9 C)] 97.9 F (36.6 C) (07/17 0619) Pulse Rate:  [71-93] 71 (07/17 0619) Resp:  [11-18] 18 (07/17 0619) BP: (110-169)/(64-104) 110/84 (07/17 0619) SpO2:  [92 %-100 %] 99 % (07/17 0619) Weight:  [136 lb (61.7 kg)] 136 lb (61.7 kg) (07/16 1116)    Intake/Output from previous day: 07/16 0701 - 07/17 0700 In: 3104.7 [P.O.:600; I.V.:2254.7; IV Piggyback:250] Out: 3430 [Urine:2875; Drains:230; Blood:325]  Physical Examination: General: alert, cooperative, and no distress Resp: clear to auscultation bilaterally Cardio: regular rate and rhythm, S1, S2 normal, no murmur, click, rub or gallop GI: incision: midline abdominal incision with op site dressing in place with staples incision intact with no active drainage and abdomen is soft, mildly hypoactive bowel sounds, appropriately tender, JP drain with serosanguinous drainage, tubing stripped. Extremities: extremities normal, atraumatic, no cyanosis or edema Foley with clear, yellow urine  Labs: WBC/Hgb/Hct/Plts:  16.0/10.1/31.2/230 (07/17 0457) BUN/Cr/glu/ALT/AST/amyl/lip:  7/0.58/--/--/--/--/-- (07/17 0457)  Assessment: 68 y.o. s/p Procedure(s): LAPAROSCOPY, DIAGNOSTIC BILATERAL SALPINGO-OOPHORECTOMY, OPEN, EX LAP WITH RADICAL TUMOR DEBULKING, OMENTECTOMY, RECTOSIGMOID RESECTION AND ANASTOMOSIS: stable Pain:  Pain is well-controlled  on PRN medications.  Heme: Hgb 10.1 and Hct 31.2 this am.   ID: WBC 16.0. Given decadron  and cefoxitin  intra-op. No evidence of infection at this time. Afebrile.  CV: BP and HR stable. Continue to monitor with vital signs.  GI:  Tolerating po: Yes, liquids. Having mild nausea. S/P rectosigmoid colon with rectosigmoid reanastomosis.   GU: Foley in place. Excellent output. Creatinine 0.58 this am.     FEN: No critical values on am labs.  Prophylaxis: SCD and lovenox  ordered.  Plan: Will order benadryl  prn for itching Will order tramadol  for mild pain Pt requesting unisom  to be ordered prn at bedtime Continue with current plan of care Increase mobility with assist Continue with foley in place until improved mobility, tomorrow am discussed as possibility for removal Continue clear liquid diet until return of bowel function IVF to 50 cc/hr   LOS: 1 day    Cloteal Isaacson D Levorn Oleski 09/02/2023, 7:01 AM

## 2023-09-02 NOTE — Consult Note (Signed)
 WOC Nurse ostomy consult note Surgical team following for assessment and plan of care.  Pt went to the OR yesterday and did not receive an ostomy.  No further role for WOC team. Please re-consult if further assistance is needed.  Thank-you,  Stephane Fought MSN, RN, CWOCN, CWCN-AP, CNS Contact Mon-Fri 0700-1500: [phone]

## 2023-09-03 LAB — BASIC METABOLIC PANEL WITH GFR
Anion gap: 5 (ref 5–15)
BUN: 6 mg/dL — ABNORMAL LOW (ref 8–23)
CO2: 28 mmol/L (ref 22–32)
Calcium: 8.3 mg/dL — ABNORMAL LOW (ref 8.9–10.3)
Chloride: 104 mmol/L (ref 98–111)
Creatinine, Ser: 0.55 mg/dL (ref 0.44–1.00)
GFR, Estimated: >60 mL/min (ref >60)
Glucose, Bld: 96 mg/dL (ref 70–99)
Potassium: 3.6 mmol/L (ref 3.5–5.1)
Sodium: 137 mmol/L (ref 135–145)

## 2023-09-03 LAB — CBC
HCT: 29.6 % — ABNORMAL LOW (ref 36.0–46.0)
Hemoglobin: 9.5 g/dL — ABNORMAL LOW (ref 12.0–15.0)
MCH: 31.4 pg (ref 26.0–34.0)
MCHC: 32.1 g/dL (ref 30.0–36.0)
MCV: 97.7 fL (ref 80.0–100.0)
Platelets: 218 K/uL (ref 150–400)
RBC: 3.03 MIL/uL — ABNORMAL LOW (ref 3.87–5.11)
RDW: 17.7 % — ABNORMAL HIGH (ref 11.5–15.5)
WBC: 8.5 K/uL (ref 4.0–10.5)
nRBC: 0 % (ref 0.0–0.2)

## 2023-09-03 MED ORDER — METHOCARBAMOL 500 MG PO TABS
500.0000 mg | ORAL_TABLET | Freq: Four times a day (QID) | ORAL | Status: DC | PRN
  Administered 2023-09-03 – 2023-09-09 (×23): 500 mg via ORAL
  Filled 2023-09-03 (×23): qty 1

## 2023-09-03 NOTE — Progress Notes (Signed)
 GYN Oncology Progress Note  Patient is alert, oriented, in no acute distress, sitting in the chair. Just ambulated in the halls and tolerated this well. Has voided since foley removal. Robaxin  has helped with pain relief. No needs voiced at this time. No flatus reported. Continue with current plan of care. Clear liquid diet to remain until return of bowel function.

## 2023-09-03 NOTE — Progress Notes (Addendum)
 7/17 Night Shift Note:   Approx. 2000 on 7/17 - IV fluids D/c'd per order.   Patient wanted clarification in IV fluids being d/c'd instead of remaining at lowered rate of 36ml/hr. Called Provider on-call Astrid, MD). Was explained that intake and output was adequate for IV fluids to be stopped, while making sure to monitor and encourage PO intake. Passed messaged along to patient.   2200 on 7/17 - Patient dangled and stood at bedside, marched in place, as well as ambulate in the room for her nightly hygiene. Patient placed on left side and rested after being given night meds including sleep aid and pain medication.   0000 - Patient turned to supine/back  0200 - Patient given PO benadryl  and tylenol  for itching and mild pain. JP Drain Output - 50cc Foley Output - 650cc   0700 - Patient reports JP drain output increased but more serous, but red tinged.  JP Drain output - 75cc  Foley output - 550cc  PRN Pain medication given as well - Tramadol 

## 2023-09-03 NOTE — Progress Notes (Signed)
 2 Days Post-Op Procedure(s) (LRB): LAPAROSCOPY, DIAGNOSTIC (N/A) BILATERAL SALPINGO-OOPHORECTOMY, OPEN, EX LAP WITH RADICAL TUMOR DEBULKING, OMENTECTOMY, RECTOSIGMOID RESECTION AND ANASTOMOSIS (N/A)  Subjective: Patient reports moderate incisional pain this am. Has been taking tramadol  and tylenol  and wanting to limit narcotic use as much as possible to prevent constipation. No nausea or emesis. Tolerating clears. Has been trying to increase mobility. Itching has improved. Slept better last pm with unisom . Denies chest pain, dyspnea. Agreeable with having foley removed later this am but concerned about needing to void frequently and the pain limiting mobility.   Objective: Vital signs in last 24 hours: Temp:  [97.6 F (36.4 C)-98 F (36.7 C)] 98 F (36.7 C) (07/18 0634) Pulse Rate:  [68-80] 80 (07/18 0634) Resp:  [16-17] 16 (07/18 0634) BP: (112-146)/(68-78) 145/78 (07/18 0634) SpO2:  [97 %-100 %] 97 % (07/18 0634) Last BM Date : 08/31/23  Intake/Output from previous day: 07/17 0701 - 07/18 0700 In: 1791.7 [P.O.:900; I.V.:891.7] Out: 3985 [Urine:3700; Drains:285]  Physical Examination: General: alert, cooperative, and no distress Resp: clear to auscultation bilaterally Cardio: regular rate and rhythm, S1, S2 normal, no murmur, click, rub or gallop GI: incision: midline abdominal incision with op site dressing in place with staples incision intact with no active drainage and abdomen is soft, more hypoactive bowel sounds, appropriately tender, JP drain with serosanguinous drainage, tubing stripped. Extremities: extremities normal, atraumatic, no cyanosis or edema Foley with clear, yellow urine  Labs: WBC/Hgb/Hct/Plts:  8.5/9.5/29.6/218 (07/18 0428) BUN/Cr/glu/ALT/AST/amyl/lip:  6/0.55/--/--/--/--/-- (07/18 0428)  Assessment: 68 y.o. s/p Procedure(s): LAPAROSCOPY, DIAGNOSTIC BILATERAL SALPINGO-OOPHORECTOMY, OPEN, EX LAP WITH RADICAL TUMOR DEBULKING, OMENTECTOMY, RECTOSIGMOID  RESECTION AND ANASTOMOSIS: stable Pain:  Pain is well-controlled on PRN medications.  Heme: Hgb 9.5 and Hct 29.6 this am. Continue to monitor. Asymptomatic. Acute on chronic anemia.    ID: WBC 8.5. Given decadron  and cefoxitin  intra-op. No evidence of infection at this time. Afebrile.  CV: BP and HR stable. Continue to monitor with vital signs.  GI:  Tolerating po: Yes, liquids. Mild nausea improved. S/P rectosigmoid colon with rectosigmoid reanastomosis.   GU: Foley in place. Excellent output. Creatinine 0.55 this am.     FEN: No critical values on am labs.  Prophylaxis: SCD and lovenox  ordered.  Plan: Robaxin  ordered prn Plan for foley removal later today Increase mobility with assist Continue clear liquid diet until return of bowel function. May have cream with her coffee IVF saline locked Continue with current plan of care   LOS: 2 days    [name] D Daqwan Dougal 09/03/2023, 8:16 AM

## 2023-09-03 NOTE — Progress Notes (Signed)
 Consult was placed to IV team for PIV placement. Pt has PAC but chooses for it not to be accessed at this time. Currently pt has only PRN medication ordered for IV administration. Pt states that pain is being controlled with PO meds and does not want to have PIV placed right now. Pt is aware that if this changes she can notify RN and have her place consult to IV team for PIV placement or PAC access. Unit RN made aware of pt request.

## 2023-09-04 LAB — CBC
HCT: 30.3 % — ABNORMAL LOW (ref 36.0–46.0)
Hemoglobin: 9.9 g/dL — ABNORMAL LOW (ref 12.0–15.0)
MCH: 31.5 pg (ref 26.0–34.0)
MCHC: 32.7 g/dL (ref 30.0–36.0)
MCV: 96.5 fL (ref 80.0–100.0)
Platelets: 216 K/uL (ref 150–400)
RBC: 3.14 MIL/uL — ABNORMAL LOW (ref 3.87–5.11)
RDW: 17.2 % — ABNORMAL HIGH (ref 11.5–15.5)
WBC: 11.1 K/uL — ABNORMAL HIGH (ref 4.0–10.5)
nRBC: 0 % (ref 0.0–0.2)

## 2023-09-04 LAB — BASIC METABOLIC PANEL WITH GFR
Anion gap: 9 (ref 5–15)
BUN: 7 mg/dL — ABNORMAL LOW (ref 8–23)
CO2: 27 mmol/L (ref 22–32)
Calcium: 8.6 mg/dL — ABNORMAL LOW (ref 8.9–10.3)
Chloride: 102 mmol/L (ref 98–111)
Creatinine, Ser: 0.42 mg/dL — ABNORMAL LOW (ref 0.44–1.00)
GFR, Estimated: >60 mL/min (ref >60)
Glucose, Bld: 102 mg/dL — ABNORMAL HIGH (ref 70–99)
Potassium: 3.6 mmol/L (ref 3.5–5.1)
Sodium: 138 mmol/L (ref 135–145)

## 2023-09-04 MED ORDER — ACETAMINOPHEN 500 MG PO TABS
1000.0000 mg | ORAL_TABLET | Freq: Four times a day (QID) | ORAL | Status: DC
  Administered 2023-09-04 – 2023-09-09 (×17): 1000 mg via ORAL
  Filled 2023-09-04 (×18): qty 2

## 2023-09-04 NOTE — Plan of Care (Signed)
   Problem: Activity: Goal: Risk for activity intolerance will decrease Outcome: Progressing   Problem: Nutrition: Goal: Adequate nutrition will be maintained Outcome: Progressing   Problem: Pain Managment: Goal: General experience of comfort will improve and/or be controlled Outcome: Progressing

## 2023-09-04 NOTE — Plan of Care (Signed)
  Problem: Coping: Goal: Level of anxiety will decrease Outcome: Progressing   Problem: Elimination: Goal: Will not experience complications related to bowel motility Outcome: Progressing Goal: Will not experience complications related to urinary retention Outcome: Progressing   Problem: Pain Managment: Goal: General experience of comfort will improve and/or be controlled Outcome: Progressing

## 2023-09-04 NOTE — Progress Notes (Signed)
 3 Days Post-Op Procedure(s) (LRB): LAPAROSCOPY, DIAGNOSTIC (N/A) BILATERAL SALPINGO-OOPHORECTOMY, OPEN, EX LAP WITH RADICAL TUMOR DEBULKING, OMENTECTOMY, RECTOSIGMOID RESECTION AND ANASTOMOSIS (N/A)  Subjective: Less incisional pain this am. No nausea/flatus/BM. Objective: Vital signs in last 24 hours: Temp:  [97.8 F (36.6 C)-98.1 F (36.7 C)] 98.1 F (36.7 C) (07/19 0640) Pulse Rate:  [74-86] 74 (07/19 0640) Resp:  [18-19] 19 (07/19 0640) BP: (141-157)/(72-88) 141/79 (07/19 0640) SpO2:  [97 %-99 %] 98 % (07/19 0640) Last BM Date : 08/31/23  Intake/Output from previous day: 07/18 0701 - 07/19 0700 In: 420 [P.O.:420] Out: 3350 [Urine:3100; Drains:250]  Physical Examination: General: alert, cooperative, and no distress Resp: clear to auscultation bilaterally Cardio: regular rate and rhythm, S1, S2 normal, no murmur, click, rub or gallop GI: incision: midline abdominal incision with op site dressing in place with staples incision intact with no active drainage and abdomen is soft, more hypoactive bowel sounds, appropriately tender, JP drain with serosanguinous drainage, tubing stripped. Extremities: extremities normal, atraumatic, no cyanosis or edema   Labs: WBC/Hgb/Hct/Plts:  11.1/9.9/30.3/216 (07/19 0519) BUN/Cr/glu/ALT/AST/amyl/lip:  7/0.42/--/--/--/--/-- (07/19 0519)  Assessment: 68 y.o. s/p Procedure(s): LAPAROSCOPY, DIAGNOSTIC BILATERAL SALPINGO-OOPHORECTOMY, OPEN, EX LAP WITH RADICAL TUMOR DEBULKING, OMENTECTOMY, RECTOSIGMOID RESECTION AND ANASTOMOSIS: stable Pain:  Pain is well-controlled on PRN medications.  Heme: Hgb 9.9 and Hct 29.6 this am.  Asymptomatic. Acute on chronic anemia.    GI:  Tolerating po: Yes, liquids.    FEN: No critical values on am labs.  Prophylaxis: SCD and lovenox  ordered.  Plan: Continue clear liquid diet until return of bowel function.   LOS: 3 days    Olam Mill 09/04/2023, 9:29 AM

## 2023-09-05 LAB — CBC
HCT: 31.7 % — ABNORMAL LOW (ref 36.0–46.0)
Hemoglobin: 10.2 g/dL — ABNORMAL LOW (ref 12.0–15.0)
MCH: 30.7 pg (ref 26.0–34.0)
MCHC: 32.2 g/dL (ref 30.0–36.0)
MCV: 95.5 fL (ref 80.0–100.0)
Platelets: 263 K/uL (ref 150–400)
RBC: 3.32 MIL/uL — ABNORMAL LOW (ref 3.87–5.11)
RDW: 16.8 % — ABNORMAL HIGH (ref 11.5–15.5)
WBC: 10.8 K/uL — ABNORMAL HIGH (ref 4.0–10.5)
nRBC: 0 % (ref 0.0–0.2)

## 2023-09-05 LAB — BASIC METABOLIC PANEL WITH GFR
Anion gap: 13 (ref 5–15)
BUN: 9 mg/dL (ref 8–23)
CO2: 21 mmol/L — ABNORMAL LOW (ref 22–32)
Calcium: 8.6 mg/dL — ABNORMAL LOW (ref 8.9–10.3)
Chloride: 102 mmol/L (ref 98–111)
Creatinine, Ser: 0.57 mg/dL (ref 0.44–1.00)
GFR, Estimated: >60 mL/min (ref >60)
Glucose, Bld: 76 mg/dL (ref 70–99)
Potassium: 3.5 mmol/L (ref 3.5–5.1)
Sodium: 136 mmol/L (ref 135–145)

## 2023-09-05 MED ORDER — LABETALOL HCL 100 MG PO TABS: 200.0000 mg | ORAL_TABLET | Freq: Two times a day (BID) | ORAL | Status: DC | PRN

## 2023-09-05 NOTE — Plan of Care (Signed)

## 2023-09-05 NOTE — Progress Notes (Signed)
 4 Days Post-Op Procedure(s) (LRB): LAPAROSCOPY, DIAGNOSTIC (N/A) BILATERAL SALPINGO-OOPHORECTOMY, OPEN, EX LAP WITH RADICAL TUMOR DEBULKING, OMENTECTOMY, RECTOSIGMOID RESECTION AND ANASTOMOSIS (N/A)  Subjective: Feels more distended. No nausea/flatus/BM. Objective: Vital signs in last 24 hours: Temp:  [97.4 F (36.3 C)-98.4 F (36.9 C)] 97.4 F (36.3 C) (07/20 0623) Pulse Rate:  [88-98] 95 (07/20 0623) Resp:  [16-18] 18 (07/20 0623) BP: (119-172)/(70-97) 119/70 (07/20 0623) SpO2:  [99 %-100 %] 99 % (07/20 9376) Weight:  [61.7 kg] 61.7 kg (07/19 2208) Last BM Date : 08/31/23  Intake/Output from previous day: 07/19 0701 - 07/20 0700 In: 860 [P.O.:840] Out: 1920 [Urine:1700; Drains:220]  Physical Examination: General: alert, cooperative, and no distress Resp: clear to auscultation bilaterally Cardio: regular rate and rhythm, S1, S2 normal, no murmur, click, rub or gallop GI: incision: midline abdominal incision with op site dressing in place with staples incision intact with no active drainage and abdomen is softly distended, left-sided BS,, JP drain with serosanguinous drainage. Extremities: extremities normal, atraumatic, no cyanosis or edema   Labs: WBC/Hgb/Hct/Plts:  10.8/10.2/31.7/263 (07/20 0459) BUN/Cr/glu/ALT/AST/amyl/lip:  9/0.57/--/--/--/--/-- (07/20 0459)  Assessment: 68 y.o. s/p Procedure(s): LAPAROSCOPY, DIAGNOSTIC BILATERAL SALPINGO-OOPHORECTOMY, OPEN, EX LAP WITH RADICAL TUMOR DEBULKING, OMENTECTOMY, RECTOSIGMOID RESECTION AND ANASTOMOSIS: stable Pain:  Pain is well-controlled on PRN medications.  Heme: Hgb stable. Acute on chronic anemia.    GI:  Slow return or bowel function. Tolerating po: Yes, liquids.   CV: H/O chronic HTN; preop med: losartan , hydrochlorothiazide , crestor ; labile B/Ps  FEN: No critical values on am labs.  Prophylaxis: SCD and lovenox  ordered.  Plan: Continue clear liquid diet until return of bowel function.  Labetalol  prn  LOS:  4 days    Olam Mill, MD 09/05/2023, 11:22 AM

## 2023-09-06 LAB — BASIC METABOLIC PANEL WITH GFR
Anion gap: 12 (ref 5–15)
BUN: 6 mg/dL — ABNORMAL LOW (ref 8–23)
CO2: 21 mmol/L — ABNORMAL LOW (ref 22–32)
Calcium: 8.3 mg/dL — ABNORMAL LOW (ref 8.9–10.3)
Chloride: 104 mmol/L (ref 98–111)
Creatinine, Ser: 0.52 mg/dL (ref 0.44–1.00)
GFR, Estimated: >60 mL/min (ref >60)
Glucose, Bld: 86 mg/dL (ref 70–99)
Potassium: 3.3 mmol/L — ABNORMAL LOW (ref 3.5–5.1)
Sodium: 137 mmol/L (ref 135–145)

## 2023-09-06 LAB — CBC
HCT: 29.3 % — ABNORMAL LOW (ref 36.0–46.0)
Hemoglobin: 9.6 g/dL — ABNORMAL LOW (ref 12.0–15.0)
MCH: 31 pg (ref 26.0–34.0)
MCHC: 32.8 g/dL (ref 30.0–36.0)
MCV: 94.5 fL (ref 80.0–100.0)
Platelets: 285 K/uL (ref 150–400)
RBC: 3.1 MIL/uL — ABNORMAL LOW (ref 3.87–5.11)
RDW: 16.7 % — ABNORMAL HIGH (ref 11.5–15.5)
WBC: 8.7 K/uL (ref 4.0–10.5)
nRBC: 0 % (ref 0.0–0.2)

## 2023-09-06 MED ORDER — LOSARTAN POTASSIUM 25 MG PO TABS
25.0000 mg | ORAL_TABLET | Freq: Every day | ORAL | Status: DC
Start: 2023-09-06 — End: 2023-09-09
  Administered 2023-09-06 – 2023-09-09 (×4): 25 mg via ORAL
  Filled 2023-09-06 (×4): qty 1

## 2023-09-06 MED ORDER — ALPRAZOLAM 0.25 MG PO TABS
0.2500 mg | ORAL_TABLET | Freq: Every evening | ORAL | Status: DC | PRN
  Administered 2023-09-06: 0.25 mg via ORAL
  Filled 2023-09-06: qty 1

## 2023-09-06 MED ORDER — ENSURE PLUS HIGH PROTEIN PO LIQD: 237.0000 mL | Freq: Two times a day (BID) | ORAL | Status: DC

## 2023-09-06 MED ORDER — SIMETHICONE 80 MG PO CHEW
80.0000 mg | CHEWABLE_TABLET | Freq: Four times a day (QID) | ORAL | Status: DC | PRN
  Administered 2023-09-06 – 2023-09-09 (×7): 80 mg via ORAL
  Filled 2023-09-06 (×8): qty 1

## 2023-09-06 MED ORDER — POTASSIUM CHLORIDE CRYS ER 20 MEQ PO TBCR
20.0000 meq | EXTENDED_RELEASE_TABLET | Freq: Two times a day (BID) | ORAL | Status: AC
  Administered 2023-09-06 (×2): 20 meq via ORAL
  Filled 2023-09-06 (×2): qty 1

## 2023-09-06 NOTE — Progress Notes (Addendum)
 5 Days Post-Op Procedure(s) (LRB): LAPAROSCOPY, DIAGNOSTIC (N/A) BILATERAL SALPINGO-OOPHORECTOMY, OPEN, EX LAP WITH RADICAL TUMOR DEBULKING, OMENTECTOMY, RECTOSIGMOID RESECTION AND ANASTOMOSIS (N/A)  Subjective: Patient reports having 3 episodes of flatus last pm with none since. No BM. No significant nausea or emesis. Reports mild nausea when smelling food from trays in the hall. Has been ambulating frequently. Voiding without difficulty. Had moderate abdominal distention yesterday that concerned her. Feels this has slightly improved. No lower extremity issues reported. Would like her losartan  reordered along with xanax  at bedtime prn and simethicone  prn. Denies chest pain, dyspnea.    Objective: Vital signs in last 24 hours: Temp:  [97.7 F (36.5 C)-98.4 F (36.9 C)] 98.1 F (36.7 C) (07/21 0520) Pulse Rate:  [85-88] 88 (07/21 0520) Resp:  [16-19] 17 (07/21 0520) BP: (146-167)/(80-89) 146/89 (07/21 0520) SpO2:  [100 %] 100 % (07/21 0520) Last BM Date : 08/31/23  Intake/Output from previous day: 07/20 0701 - 07/21 0700 In: 600 [P.O.:600] Out: 1735 [Urine:1500; Drains:235]  Physical Examination: General: alert, cooperative, and no distress Resp: clear to auscultation bilaterally Cardio: regular rate and rhythm, S1, S2 normal, no murmur, click, rub or gallop GI: incision: midline abdominal incision with op site dressing in place with staples incision intact with no active drainage and abdomen is soft, mildly distended and tympanic, more active bowel sounds, appropriately tender, JP drain with serosanguinous drainage Extremities: extremities normal, atraumatic, no cyanosis or edema  Labs: WBC/Hgb/Hct/Plts:  8.7/9.6/29.3/285 (07/21 0420) BUN/Cr/glu/ALT/AST/amyl/lip:  6/0.52/--/--/--/--/-- (07/21 0420)  Assessment: 68 y.o. s/p Procedure(s): LAPAROSCOPY, DIAGNOSTIC BILATERAL SALPINGO-OOPHORECTOMY, OPEN, EX LAP WITH RADICAL TUMOR DEBULKING, OMENTECTOMY, RECTOSIGMOID RESECTION AND  ANASTOMOSIS: stable Pain:  Pain is well-controlled on PRN medications.  Heme: Hgb 9.6 and Hct 29.3 this am. Continue to monitor. Asymptomatic. Acute on chronic anemia.    ID: WBC 8.7. Given decadron  and cefoxitin  intra-op. No evidence of infection at this time. Afebrile.  CV: BP and HR overall stable. BP slightly elevated-will reorder losartan . Continue to monitor with vital signs.  GI:  Tolerating po: Yes, liquids. Slow to return bowel function, signs for possible ileus. S/P rectosigmoid colon with rectosigmoid reanastomosis.   GU: Adequate output. Creatinine 0.52 this am.     FEN: No critical values on am labs. K+3.3-replacement ordered  Prophylaxis: SCD and lovenox  ordered.  Plan: Losartan , xanax  prn, and simethicone  prn ordered Continue clear liquid diet until more regular passing of flatus, return of bowel function. May have cream with her coffee. Pt to notify RN when +more flatus and/or BM for diet to be advanced Continue with current plan of care   LOS: 5 days    Debra Nguyen 09/06/2023, 7:56 AM

## 2023-09-06 NOTE — Plan of Care (Signed)
  Problem: Elimination: Goal: Will not experience complications related to bowel motility Outcome: Progressing Note: Pt states that she is starting to have flatulence.   Problem: Pain Managment: Goal: General experience of comfort will improve and/or be controlled Outcome: Progressing

## 2023-09-07 LAB — SURGICAL PATHOLOGY

## 2023-09-07 MED ORDER — ALPRAZOLAM 0.25 MG PO TABS
0.2500 mg | ORAL_TABLET | Freq: Every evening | ORAL | Status: DC | PRN
  Administered 2023-09-07: 0.5 mg via ORAL
  Administered 2023-09-08: 0.25 mg via ORAL
  Filled 2023-09-07 (×3): qty 1

## 2023-09-07 NOTE — Progress Notes (Signed)
 GYN Oncology Progress Note  Patient is alert, oriented, ambulating in the hall.  She reports doing well today.  No more bowel movements since last evening and no flatus.  She does report her abdomen as feeling slightly full.  She has been eating small bites of regular food and tolerating this.  No nausea or emesis.  All questions answered.  Continue with current plan of care.

## 2023-09-07 NOTE — Progress Notes (Signed)
 6 Days Post-Op Procedure(s) (LRB): LAPAROSCOPY, DIAGNOSTIC (N/A) BILATERAL SALPINGO-OOPHORECTOMY, OPEN, EX LAP WITH RADICAL TUMOR DEBULKING, OMENTECTOMY, RECTOSIGMOID RESECTION AND ANASTOMOSIS (N/A)  Subjective: Patient reports doing well this am. Tolerated full liquids last pm. No nausea or emesis reported. Ambulated frequently. Voiding without difficulty. Had small amount of loose stool when taking a shower overnight. Abdominal distension feels decreased. Agreeable with trying a regular diet. Got six hours of sleep last pm after taking xanax .  Objective: Vital signs in last 24 hours: Temp:  [97.8 F (36.6 C)-98.3 F (36.8 C)] 98.3 F (36.8 C) (07/22 0626) Pulse Rate:  [87-93] 87 (07/22 0626) Resp:  [15-17] 15 (07/22 0626) BP: (131-158)/(72-96) 131/72 (07/22 0626) SpO2:  [98 %-100 %] 98 % (07/22 0626) Last BM Date : 08/31/23  Intake/Output from previous day: 07/21 0701 - 07/22 0700 In: 920 [P.O.:920] Out: 1380 [Urine:1220; Drains:160]  Physical Examination: General: alert, cooperative, and no distress Resp: clear to auscultation bilaterally Cardio: regular rate and rhythm, S1, S2 normal, no murmur, click, rub or gallop GI: incision: midline abdominal incision with op site dressing in place with staples incision intact with no active drainage and abdomen is soft, mildly distended and tympanic, more active bowel sounds, appropriately tender, JP drain with serosanguinous drainage Extremities: extremities normal, atraumatic, no cyanosis or edema  Labs: From 09/06/2023  Assessment: 68 y.o. s/p Procedure(s): LAPAROSCOPY, DIAGNOSTIC BILATERAL SALPINGO-OOPHORECTOMY, OPEN, EX LAP WITH RADICAL TUMOR DEBULKING, OMENTECTOMY, RECTOSIGMOID RESECTION AND ANASTOMOSIS: stable Pain:  Pain is well-controlled on PRN medications.  Heme: Hgb 9.6 and Hct 29.3 yesterday am. Continue to monitor. Asymptomatic. Acute on chronic anemia.    ID: WBC 8.7 yesterday am. Given decadron  and cefoxitin   intra-op. No evidence of infection at this time. Afebrile.  CV: BP and HR overall stable. BP slightly elevated-losartan  reordered. Continue to monitor with vital signs.  GI:  Tolerating po: Yes, full liquids. +small amount of loose stools. S/P rectosigmoid colon with rectosigmoid reanastomosis.   GU: Adequate output. Creatinine 0.52 yesterday am.     FEN: No critical values on am labs. K+3.3 yesterday-replacement ordered  Prophylaxis: SCD and lovenox  ordered.  Plan: Diet to regular diet Continue with current plan of care When meeting milestones, having increased bowel function, plan for discharge home as soon as tomorrow   LOS: 6 days    Debra Nguyen 09/07/2023, 8:03 AM

## 2023-09-08 ENCOUNTER — Inpatient Hospital Stay: Admitting: Gynecologic Oncology

## 2023-09-08 LAB — CBC
HCT: 34 % — ABNORMAL LOW (ref 36.0–46.0)
Hemoglobin: 10.9 g/dL — ABNORMAL LOW (ref 12.0–15.0)
MCH: 31 pg (ref 26.0–34.0)
MCHC: 32.1 g/dL (ref 30.0–36.0)
MCV: 96.6 fL (ref 80.0–100.0)
Platelets: 403 K/uL — ABNORMAL HIGH (ref 150–400)
RBC: 3.52 MIL/uL — ABNORMAL LOW (ref 3.87–5.11)
RDW: 16.4 % — ABNORMAL HIGH (ref 11.5–15.5)
WBC: 7.7 K/uL (ref 4.0–10.5)
nRBC: 0 % (ref 0.0–0.2)

## 2023-09-08 LAB — BASIC METABOLIC PANEL WITH GFR
Anion gap: 11 (ref 5–15)
BUN: 6 mg/dL — ABNORMAL LOW (ref 8–23)
CO2: 25 mmol/L (ref 22–32)
Calcium: 8.9 mg/dL (ref 8.9–10.3)
Chloride: 101 mmol/L (ref 98–111)
Creatinine, Ser: 0.49 mg/dL (ref 0.44–1.00)
GFR, Estimated: >60 mL/min (ref >60)
Glucose, Bld: 112 mg/dL — ABNORMAL HIGH (ref 70–99)
Potassium: 3.7 mmol/L (ref 3.5–5.1)
Sodium: 137 mmol/L (ref 135–145)

## 2023-09-08 MED ORDER — ROSUVASTATIN CALCIUM 5 MG PO TABS
5.0000 mg | ORAL_TABLET | Freq: Every day | ORAL | Status: DC
  Administered 2023-09-08 – 2023-09-09 (×2): 5 mg via ORAL
  Filled 2023-09-08 (×2): qty 1

## 2023-09-08 NOTE — Progress Notes (Signed)
 GYN Oncology Progress Note  Patient is currently ambulating in the halls with her sister.  Reports feeling slightly less full.  No nausea or emesis.  Had 3 small occurrences of flatus.  No bowel movements.  She has eaten some melon today and tolerated this.  Reports urine as being more concentrated today. Moderately hypo bowel sounds on auscultation, no significant change in distension. All questions answered. Continue with current plan of care.

## 2023-09-08 NOTE — Progress Notes (Signed)
 7 Days Post-Op Procedure(s) (LRB): LAPAROSCOPY, DIAGNOSTIC (N/A) BILATERAL SALPINGO-OOPHORECTOMY, OPEN, EX LAP WITH RADICAL TUMOR DEBULKING, OMENTECTOMY, RECTOSIGMOID RESECTION AND ANASTOMOSIS (N/A)  Subjective: Patient reports feeling full this am. She did not eat anything at dinner last pm due to this. She had some melon this am. No nausea or emesis. No BM or significant passing of flatus. Had three small occurrences of flatus. Belching intermittently. Feels her abdomen may be slightly less distended. States urine has been more concentrated. Continuing to walk frequently. Sister at the bedside. Slightly discouraged about current situation and prolonged time in the hospital.   Objective: Vital signs in last 24 hours: Temp:  [97.8 F (36.6 C)-98.3 F (36.8 C)] 98.1 F (36.7 C) (07/23 0637) Pulse Rate:  [87-94] 87 (07/23 0637) Resp:  [16-18] 18 (07/23 0637) BP: (123-153)/(61-90) 144/84 (07/23 0637) SpO2:  [98 %-100 %] 98 % (07/23 0637) Last BM Date : 09/02/23  Intake/Output from previous day: 07/22 0701 - 07/23 0700 In: 1080 [P.O.:1080] Out: 1810 [Urine:1700; Drains:110]  Physical Examination: General: alert, cooperative, and no distress Resp: clear to auscultation bilaterally Cardio: regular rate and rhythm, S1, S2 normal, no murmur, click, rub or gallop GI: incision: midline abdominal incision with staples incision intact with no active drainage, at the umbilicus mild erythema noted with no fluctuance or increased warmth/tenderness, ? Cause of friction on incision from waist band on mesh panties and abdomen is soft, mildly distended (more in upper abdomen) and not significantly tympanic, less active bowel sounds compared with 09/07/23, appropriately tender, JP drain with serous drainage Extremities: extremities normal, atraumatic, no cyanosis or edema  Labs: From 09/06/2023  Assessment: 68 y.o. s/p Procedure(s): LAPAROSCOPY, DIAGNOSTIC BILATERAL SALPINGO-OOPHORECTOMY, OPEN, EX LAP  WITH RADICAL TUMOR DEBULKING, OMENTECTOMY, RECTOSIGMOID RESECTION AND ANASTOMOSIS: stable Pain:  Pain is well-controlled on PRN medications.  Heme: Hgb 9.6 and Hct 29.3 09/06/2023 am. Plan for repeat labs today. Continue to monitor. Asymptomatic. Acute on chronic anemia.    ID: WBC 8.7 on 09/06/2023 am. Given decadron  and cefoxitin  intra-op. No evidence of infection at this time. Afebrile. Will add light dressing as barrier on abdominal incision to see if erythema improves.  CV: BP and HR overall stable. BP slightly elevated-losartan  reordered. Continue to monitor with vital signs.  GI:  Tolerating po: Small amounts but having decreased intake due to full feeling. S/P rectosigmoid colon with rectosigmoid reanastomosis.   GU: Adequate output. Creatinine 0.52 on 09/06/23 am.     FEN: No critical values on am labs. K+3.3 on 09/06/23-replacement ordered  Prophylaxis: SCD and lovenox  ordered.  Plan: Labs this am Monitor incisional erythema at the umbilicus Continue with current plan of care. Patient performing all recommendations (ambulating, rocking chair, chewing gum). Discussed continued course.   LOS: 7 days    Eleanor JONETTA Epps 09/08/2023, 9:54 AM

## 2023-09-09 ENCOUNTER — Other Ambulatory Visit (HOSPITAL_COMMUNITY): Payer: Self-pay

## 2023-09-09 MED ORDER — APIXABAN 2.5 MG PO TABS
2.5000 mg | ORAL_TABLET | Freq: Two times a day (BID) | ORAL | 0 refills | Status: DC
Start: 2023-09-10 — End: 2023-10-13
  Filled 2023-09-09: qty 40, 20d supply, fill #0

## 2023-09-09 MED ORDER — IOHEXOL 9 MG/ML PO SOLN
ORAL | Status: AC
  Filled 2023-09-09: qty 1000

## 2023-09-09 MED ORDER — IOHEXOL 9 MG/ML PO SOLN: 500.0000 mL | ORAL | Status: AC

## 2023-09-09 NOTE — Progress Notes (Signed)
 8 Days Post-Op Procedure(s) (LRB): LAPAROSCOPY, DIAGNOSTIC (N/A) BILATERAL SALPINGO-OOPHORECTOMY, OPEN, EX LAP WITH RADICAL TUMOR DEBULKING, OMENTECTOMY, RECTOSIGMOID RESECTION AND ANASTOMOSIS (N/A)  Subjective: Patient reports no change in symptoms overnight or this am. Has only had a small amount of flatus. No BM. No nausea or emesis. Feels her abdomen may be slightly less distended. Continuing to walk frequently. Agreeable with having a CT scan.    Objective: Vital signs in last 24 hours: Temp:  [98.2 F (36.8 C)-98.5 F (36.9 C)] 98.5 F (36.9 C) (07/24 0628) Pulse Rate:  [83-107] 83 (07/24 0628) Resp:  [16-18] 18 (07/24 0628) BP: (142-149)/(78-91) 143/91 (07/24 0628) SpO2:  [97 %-100 %] 97 % (07/24 0628) Last BM Date : 08/31/23  Intake/Output from previous day: 07/23 0701 - 07/24 0700 In: 1380 [P.O.:1380] Out: 2780 [Urine:2650; Drains:130]  Physical Examination: General: alert, cooperative, and no distress Resp: clear to auscultation bilaterally Cardio: regular rate and rhythm, S1, S2 normal, no murmur, click, rub or gallop GI: incision: midline abdominal incision with staples incision intact with no active drainage, at the umbilicus mild erythema noted with no fluctuance or increased warmth/tenderness, ? Cause of friction on incision from waist band on mesh panties or from op site dressing reaction and abdomen is soft, mildly distended (more in upper abdomen) and not significantly tympanic, hypoactive bowel sounds compared, appropriately tender, JP drain with serous drainage Extremities: extremities normal, atraumatic, no cyanosis or edema  Labs: From 09/08/2023  Assessment: 68 y.o. s/p Procedure(s): LAPAROSCOPY, DIAGNOSTIC BILATERAL SALPINGO-OOPHORECTOMY, OPEN, EX LAP WITH RADICAL TUMOR DEBULKING, OMENTECTOMY, RECTOSIGMOID RESECTION AND ANASTOMOSIS: stable Pain:  Pain is well-controlled on PRN medications.  Heme: Hgb 10.9 and Hct 34.0 09/08/2023 am. Continue to monitor.  Asymptomatic. Acute on chronic anemia.    ID: WBC 7.7 on 09/08/2023 am. Given decadron  and cefoxitin  intra-op. No evidence of infection at this time. Afebrile. Monitor mild erythema around mid incision.  CV: BP and HR overall stable. BP slightly elevated-losartan  reordered. Continue to monitor with vital signs.  GI:  Tolerating po: prolonged return of bowel function, decreased intake due to fullness. S/P rectosigmoid colon with rectosigmoid reanastomosis. Plan for CT scan to further evaluate.  GU: Adequate output. Creatinine 0.49 on 09/08/23 am.     FEN: No critical values on am labs. K+3.7 on 09/08/23  Prophylaxis: SCD and lovenox  ordered.  Plan: Plan for CT scan of the abdomen/pelvis to further evaluate symptoms of ileus, prolonged bowel return Monitor incisional erythema at the umbilicus Continue with current plan of care. Patient performing all recommendations (ambulating, rocking chair, chewing gum). Discussed continued course.   LOS: 8 days    Sevana Grandinetti D Zoha Spranger 09/09/2023, 9:00 AM

## 2023-09-09 NOTE — Progress Notes (Signed)
 Discharge medications delivered to patient at bedside [facility]

## 2023-09-09 NOTE — Progress Notes (Signed)
 GYN Oncology Progress Note  RN reported patient having a large brown stool. Given this, CT scan has been cancelled.

## 2023-09-09 NOTE — Discharge Summary (Signed)
 Physician Discharge Summary  Patient ID: Debra Nguyen MRN: [id number] DOB/AGE: April 06, 1955 68 y.o.  Admit date: 09/01/2023 Discharge date: 09/09/2023  Admission Diagnoses: Gynecologic malignancy [facility])  Discharge Diagnoses:  Principal Problem:   Gynecologic malignancy [facility]) Active Problems:   Ovarian cancer [facility])   Discharged Condition:  The patient is in good condition and stable for discharge.    Hospital Course: On 09/01/2023, the patient underwent the following: Procedure(s): LAPAROSCOPY, DIAGNOSTIC, BILATERAL SALPINGO-OOPHORECTOMY, OPEN, EX LAP WITH RADICAL TUMOR DEBULKING, OMENTECTOMY, RECTOSIGMOID RESECTION AND ANASTOMOSIS. The postoperative course included prolonged return of bowel function.  She was discharged to home on postoperative day 8 tolerating a regular diet, ambulating, voiding, having bowel movements, pain controlled.   Consults: None  Significant Diagnostic Studies: Labs  Treatments: Surgery: see above  Discharge Exam: Blood pressure 130/83, pulse (!) 101, temperature 98.3 F (36.8 C), temperature source Oral, resp. rate 18, height 5' 7 (1.702 m), weight 135 lb 15.7 oz (61.7 kg), last menstrual period 02/02/2011, SpO2 100%. From am assessment General: alert, cooperative, and no distress Resp: clear to auscultation bilaterally Cardio: regular rate and rhythm, S1, S2 normal, no murmur, click, rub or gallop GI: incision: midline abdominal incision with staples incision intact with no active drainage, at the umbilicus mild erythema noted with no fluctuance or increased warmth/tenderness, ? Cause of friction on incision from waist band on mesh panties or from op site dressing reaction and abdomen is soft, mildly distended (more in upper abdomen) and not significantly tympanic, hypoactive bowel sounds, appropriately tender, JP drain with serous drainage (to be removed at discharge) Extremities: extremities normal, atraumatic, no cyanosis or edema  Disposition:  Discharge disposition: 01-Home or Self Care       Discharge Instructions     Call MD for:  difficulty breathing, headache or visual disturbances   Complete by: As directed    Call MD for:  extreme fatigue   Complete by: As directed    Call MD for:  hives   Complete by: As directed    Call MD for:  persistant dizziness or light-headedness   Complete by: As directed    Call MD for:  persistant nausea and vomiting   Complete by: As directed    Call MD for:  redness, tenderness, or signs of infection (pain, swelling, redness, odor or green/yellow discharge around incision site)   Complete by: As directed    Call MD for:  severe uncontrolled pain   Complete by: As directed    Call MD for:  temperature >100.4   Complete by: As directed    Diet - low sodium heart healthy   Complete by: As directed    Discharge wound care:   Complete by: As directed    Keep incision clean and dry   Driving Restrictions   Complete by: As directed    No driving for 2 week(s).  Do not take narcotics and drive. You need to make sure your reaction time has returned.   Increase activity slowly   Complete by: As directed    Lifting restrictions   Complete by: As directed    No lifting greater than 10 lbs, pushing, pulling, straining for 6 weeks.   Sexual Activity Restrictions   Complete by: As directed    No sexual activity, nothing in the vagina, for 6 weeks.      Allergies as of 09/09/2023       Reactions   Chlorhexidine  Rash   Codeine Itching   Ultram  [tramadol ]  Itching        Medication List     STOP taking these medications    bisacodyl  5 MG EC tablet Generic drug: bisacodyl    metroNIDAZOLE  500 MG tablet Commonly known as: FLAGYL    polyethylene glycol 17 g packet Commonly known as: MIRALAX  / GLYCOLAX    polyethylene glycol powder 17 GM/SCOOP powder Commonly known as: MiraLax    potassium chloride  SA 20 MEQ tablet Commonly known as: KLOR-CON  M       TAKE these  medications    ALPRAZolam  0.25 MG tablet Commonly known as: Xanax  Take 1 tablet (0.25 mg total) by mouth at bedtime as needed for anxiety.   apixaban  2.5 MG Tabs tablet Commonly known as: Eliquis  Take 1 tablet (2.5 mg total) by mouth 2 (two) times daily. Start taking on: September 10, 2023   Calcium  600+D 600-10 MG-MCG Tabs Generic drug: Calcium  Carb-Cholecalciferol Take 1 tablet by mouth daily.   doxylamine  (Sleep) 25 MG tablet Commonly known as: UNISOM  Take 12.5 mg by mouth at bedtime.   hydrochlorothiazide  12.5 MG tablet Commonly known as: HYDRODIURIL  Take 1 tablet (12.5 mg total) by mouth every morning.   lidocaine -prilocaine  cream Commonly known as: EMLA  Apply 1 Application topically as needed (Apply to port 1-2 hours prior to its use).   losartan  25 MG tablet Commonly known as: COZAAR  Take 1 tablet (25 mg total) by mouth daily.   metroNIDAZOLE  0.75 % cream Commonly known as: METROCREAM  Apply 1 application topically to affected area 2 (two) times daily.   multivitamins ther. w/minerals Tabs tablet Take 1 tablet by mouth daily.   ondansetron  4 MG disintegrating tablet Commonly known as: ZOFRAN -ODT Dissolve 1 tablet (4 mg total) by mouth every 8 (eight) hours as needed for nausea or vomiting.   prochlorperazine  10 MG tablet Commonly known as: COMPAZINE  Take 1 tablet (10 mg total) by mouth every 6 (six) hours as needed for nausea or vomiting.   rosuvastatin  5 MG tablet Commonly known as: CRESTOR  Take 1 tablet (5 mg total) by mouth daily.   traMADol  50 MG tablet Commonly known as: ULTRAM  Take 1 tablet (50 mg total) by mouth every 6 (six) hours as needed for severe pain (pain score 7-10). For AFTER surgery only, do not take and drive   zolpidem  10 MG tablet Commonly known as: AMBIEN  Take 1 tablet (10 mg total) by mouth at bedtime as needed for insomnia.               Discharge Care Instructions  (From admission, onward)           Start     Ordered    09/09/23 0000  Discharge wound care:       Comments: Keep incision clean and dry   09/09/23 1347            Follow-up Information     Viktoria Comer SAUNDERS, MD Follow up on 09/08/2023.   Specialty: Gynecologic Oncology Why: Plan for a phone visit on 09/08/23 with [name] to check in and discuss final pathology. IN PERSON visit will be on 10/01/23 at 3:45pm at [facility]. Contact information: [address] [place] [address] [phone]                 Greater than thirty minutes were spend for face to face discharge instructions and discharge orders/summary in EPIC.   Signed: Eleanor JONETTA Epps 09/09/2023, 1:56 PM

## 2023-09-09 NOTE — Progress Notes (Signed)
 Patient discharged home, JP drain removed prior to discharge, discharge paperwork provided and explained to patient as well as patient's sister, patient and patient's sister verbalized understanding, discharge medications delivered to patient at the bedside prior to patient leaving the hospital.

## 2023-09-09 NOTE — Plan of Care (Signed)
 ?  Problem: Clinical Measurements: ?Goal: Ability to maintain clinical measurements within normal limits will improve ?Outcome: Progressing ?Goal: Will remain free from infection ?Outcome: Progressing ?Goal: Diagnostic test results will improve ?Outcome: Progressing ?  ?

## 2023-09-10 ENCOUNTER — Telehealth: Payer: Self-pay | Admitting: *Deleted

## 2023-09-10 NOTE — Telephone Encounter (Signed)
 Spoke with Ms. Coleson this morning. She states she is eating, drinking and urinating well. She has had a BM and is passing gas. Encouraged her to drink plenty of water . She denies fever or chills. Incisions are dry and intact. She rates her pain 1/10. (Mild discomfort only)Her pain is controlled with tylenol .    Instructed to call office with any fever, chills, purulent drainage, uncontrolled pain or any other questions or concerns. Patient verbalizes understanding.   Pt aware of post op appointments as well as the office number [phone] and after hours number [phone] to call if she has any questions or concerns

## 2023-09-13 ENCOUNTER — Other Ambulatory Visit (HOSPITAL_BASED_OUTPATIENT_CLINIC_OR_DEPARTMENT_OTHER): Payer: Self-pay

## 2023-09-13 ENCOUNTER — Inpatient Hospital Stay: Admitting: Gynecologic Oncology

## 2023-09-13 ENCOUNTER — Other Ambulatory Visit: Payer: Self-pay | Admitting: Oncology

## 2023-09-13 ENCOUNTER — Other Ambulatory Visit: Payer: Self-pay | Admitting: Gynecologic Oncology

## 2023-09-13 ENCOUNTER — Encounter: Payer: Self-pay | Admitting: Oncology

## 2023-09-13 VITALS — BP 123/79 | HR 94 | Temp 98.3°F | Resp 18 | Wt 125.6 lb

## 2023-09-13 DIAGNOSIS — T8149XA Infection following a procedure, other surgical site, initial encounter: Secondary | ICD-10-CM

## 2023-09-13 DIAGNOSIS — C579 Malignant neoplasm of female genital organ, unspecified: Secondary | ICD-10-CM

## 2023-09-13 MED ORDER — CEPHALEXIN 500 MG PO CAPS
500.0000 mg | ORAL_CAPSULE | Freq: Two times a day (BID) | ORAL | 0 refills | Status: DC
  Filled 2023-09-13: qty 14, 7d supply, fill #0

## 2023-09-13 NOTE — Progress Notes (Signed)
 Gynecologic Oncology Multi-Disciplinary Disposition Conference Note  Date of the Conference: 09/13/2023  Patient Name: Debra Nguyen Dannielle  Referring Provider: [name] Primary GYN Oncologist: 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 Disposition is for adjuvant chemotherapy and HRD testing.   This Multidisciplinary conference took place involving physicians from Gynecologic Oncology, Medical Oncology, Radiation Oncology, Pathology, Radiology along with the Gynecologic Oncology Nurse Practitioner and Gynecologic Oncology Nurse Navigator.  Comprehensive assessment of the patient's malignancy, staging, need for surgery, chemotherapy, radiation therapy, and need for further testing were reviewed. Supportive measures, both inpatient and following discharge were also discussed. The recommended plan of care is documented. Greater than 35 minutes were spent correlating and coordinating this patient's care.

## 2023-09-13 NOTE — Progress Notes (Signed)
 See media. Patient to the office today for staple removal. She has been doing well since discharge. Tolerating diet with no nausea or emesis, voiding, having bowel movements (loose at times). Today she noted incisional erythema below the umbilicus. The erythema is measuring 3 cm in length. See media for photo. Induration noted 2 cm in length along length of incision. No drainage. No evidence of wound separation. Slightly increased warmth with the erythematous skin. No fever or chills. Based on the findings and after discussion with patient, will plan for 7 day course of keflex . She is advised to call if symptoms do not improve, new symptoms arise. The erythema was marked as well.

## 2023-09-13 NOTE — Progress Notes (Signed)
 Patient seen in clinic this morning for a mid-line abdominal incision staple removal. Vitals signs within normal limits. Incision clean and dry. Small area to just below the umbilicus that is red. Pt denies tenderness. Staples removed, benzoin applied, steri strips applied to all    areas accept the approximate 3 cm area of redness that has been marked. Ms. Crandall denies fever and or chills and tolerated procedure well. [name], NP evaluated patient's incision. Orders received. Pt left accompanied by her sister in [place].

## 2023-09-14 DIAGNOSIS — I700 Atherosclerosis of aorta: Secondary | ICD-10-CM | POA: Diagnosis not present

## 2023-09-14 DIAGNOSIS — C549 Malignant neoplasm of corpus uteri, unspecified: Secondary | ICD-10-CM | POA: Diagnosis not present

## 2023-09-14 DIAGNOSIS — L089 Local infection of the skin and subcutaneous tissue, unspecified: Secondary | ICD-10-CM | POA: Diagnosis not present

## 2023-09-14 DIAGNOSIS — Z79899 Other long term (current) drug therapy: Secondary | ICD-10-CM | POA: Diagnosis not present

## 2023-09-14 DIAGNOSIS — I10 Essential (primary) hypertension: Secondary | ICD-10-CM | POA: Diagnosis not present

## 2023-09-14 DIAGNOSIS — E7800 Pure hypercholesterolemia, unspecified: Secondary | ICD-10-CM | POA: Diagnosis not present

## 2023-09-14 DIAGNOSIS — C579 Malignant neoplasm of female genital organ, unspecified: Secondary | ICD-10-CM | POA: Diagnosis not present

## 2023-09-15 ENCOUNTER — Telehealth: Payer: Self-pay | Admitting: *Deleted

## 2023-09-15 ENCOUNTER — Encounter: Payer: Self-pay | Admitting: Gynecologic Oncology

## 2023-09-15 NOTE — Telephone Encounter (Signed)
 Spoke with [name] and per [name], NP patient is to stop taking the keflex . If we decrease to once daily, it can build resistance since not as frequent as prescribed. Pt reports her stools are soft, not liquid and she has urgency to go frequently and sometimes only a little bit comes out or it's just gas. Since patient is not having loose watery stools we will hold off on getting a sample to check for c diff. Advised patient to keep herself well hydrated and the office will call back tomorrow morning to check in. Pt denies cramping, pain, fever and or chills. Pt is also to monitor abdominal incision area for increased redness or edema. Pt verbalized understanding and thanked the office for calling back.

## 2023-09-16 ENCOUNTER — Other Ambulatory Visit (HOSPITAL_BASED_OUTPATIENT_CLINIC_OR_DEPARTMENT_OTHER): Payer: Self-pay

## 2023-09-16 ENCOUNTER — Other Ambulatory Visit: Payer: Self-pay | Admitting: Gynecologic Oncology

## 2023-09-16 ENCOUNTER — Encounter: Payer: Self-pay | Admitting: Oncology

## 2023-09-16 ENCOUNTER — Inpatient Hospital Stay

## 2023-09-16 ENCOUNTER — Telehealth: Payer: Self-pay | Admitting: Gynecologic Oncology

## 2023-09-16 DIAGNOSIS — A0472 Enterocolitis due to Clostridium difficile, not specified as recurrent: Secondary | ICD-10-CM

## 2023-09-16 DIAGNOSIS — Z5111 Encounter for antineoplastic chemotherapy: Secondary | ICD-10-CM | POA: Diagnosis not present

## 2023-09-16 DIAGNOSIS — Z01818 Encounter for other preprocedural examination: Secondary | ICD-10-CM | POA: Diagnosis not present

## 2023-09-16 DIAGNOSIS — C778 Secondary and unspecified malignant neoplasm of lymph nodes of multiple regions: Secondary | ICD-10-CM | POA: Diagnosis not present

## 2023-09-16 DIAGNOSIS — C786 Secondary malignant neoplasm of retroperitoneum and peritoneum: Secondary | ICD-10-CM | POA: Diagnosis not present

## 2023-09-16 DIAGNOSIS — I2510 Atherosclerotic heart disease of native coronary artery without angina pectoris: Secondary | ICD-10-CM | POA: Diagnosis not present

## 2023-09-16 DIAGNOSIS — I10 Essential (primary) hypertension: Secondary | ICD-10-CM | POA: Diagnosis not present

## 2023-09-16 DIAGNOSIS — C579 Malignant neoplasm of female genital organ, unspecified: Secondary | ICD-10-CM | POA: Diagnosis not present

## 2023-09-16 DIAGNOSIS — R197 Diarrhea, unspecified: Secondary | ICD-10-CM

## 2023-09-16 DIAGNOSIS — R971 Elevated cancer antigen 125 [CA 125]: Secondary | ICD-10-CM | POA: Diagnosis not present

## 2023-09-16 LAB — C DIFFICILE QUICK SCREEN W PCR REFLEX
C Diff antigen: POSITIVE — AB
C Diff toxin: NEGATIVE

## 2023-09-16 MED ORDER — METRONIDAZOLE 500 MG PO TABS
500.0000 mg | ORAL_TABLET | Freq: Three times a day (TID) | ORAL | 0 refills | Status: DC
  Filled 2023-09-16: qty 30, 10d supply, fill #0

## 2023-09-16 NOTE — Progress Notes (Signed)
 See note from RN. Patient called back later in the day with worsening loose stools with foul odor.

## 2023-09-16 NOTE — Telephone Encounter (Signed)
 Spoke with patient this morning after leaving a voicemail at the end of the day yesterday advising patient that provider would like her to continue taking the Keflex .   Debra Nguyen states she didn't take the Keflex  dose last night or this morning. Pt believes her GI upset has been from the keflex . Pt's symptoms have improved. She is still having the stool urgency, but only 6 times last night as opposed to 6-8 every hour yesterday. Pt has saved the small loose mucous with orange colored stool she had this morning. Pt reports she went 24 hours without solid food and has only had liquids.   Reports she had carrots and sweet potato for dinner and that was her first solid food since Tuesday. Pt states she is not experiencing any cramping or pain, her abdominal incision looks good without redness, edema or drainage.   Pt does report that the area just below the umbilicus that was marked on Monday in the office with redness- that site is looking better. Pt is aware to call the office with any new symptoms and or if stool urgency doesn't continue to improve. Advised patient her message will be relayed to provider and the office would call back with any new recommendations. Pt thanked the office for calling.

## 2023-09-16 NOTE — Telephone Encounter (Signed)
 Patient returned call to the office.  She states she is overall doing well.  She had 2 smaller bowel movements this morning that were more mucus-like and 2 times this afternoon that were brown, loose, and small amount in quantity.  No cramping, abdominal discomfort, nausea, vomiting.  No fever or chills reported with recent temp at 97.9.  She does report fecal urgency with an acute spasm like sensation near the anus intermittently.  Sometimes when she goes to the bathroom, she has no output rectally. She feels the abdominal incision mild erythema has not worsened and may be slightly improved. Feels the area is slightly softer. No drainage. Her last dose of keflex  was yesterday am. She felt her loose stools worsened with starting the keflex . She has stopped this.  Discussed the c diff quick screen testing. Advised PCR is pending at this time. After discussion, will plan on waiting for PCR testing prior to starting flagyl . Reportable signs and symptoms reviewed. She is advised to call for any worsening or new symptoms while we are waiting for the results.

## 2023-09-16 NOTE — Telephone Encounter (Signed)
 Spoke with Ms. Reddin and relayed message from [name], NP. -patient can hold off on the keflex  and monitor the redness. Pt verbalized understanding and will drop off a stool sample to send to the lab.

## 2023-09-16 NOTE — Telephone Encounter (Signed)
 Attempted to call patient with results of C diff testing. Left message asking her to please call the office when available.

## 2023-09-17 ENCOUNTER — Telehealth: Payer: Self-pay | Admitting: *Deleted

## 2023-09-17 ENCOUNTER — Ambulatory Visit: Payer: Self-pay | Admitting: *Deleted

## 2023-09-17 LAB — CLOSTRIDIUM DIFFICILE BY PCR, REFLEXED
Hypervirulent Strain: POSITIVE — AB
Toxigenic C. Difficile by PCR: POSITIVE — AB

## 2023-09-17 NOTE — Telephone Encounter (Signed)
 Spoke with Ms. Gherardi and relayed message from [name], NP that PCR came back positive.   Reviewed C. Diff precautions with patient in length and advised to use bleach wipes not Lysol wipes, as bleach will be effective against C. Diff. Patient verbalized understanding. Pt is already following C. Diff precautions at home. She states, I have had  about 8 small amounts of stool today  & no more explosive diarrhea like last night   Relayed -about 1 in 9 people who get C. Diff infection will get it again in the subsequent 2-8 weeks. This can be a relapse of their original infection, or it can happen when they come in contact with C. Diff again.    Advised patient to continue taking the flagyl  and call the after hours number this weekend with any concerns. Pt verbalized understanding and thanked the office for calling.

## 2023-09-17 NOTE — Telephone Encounter (Signed)
 Spoke with [name] and relayed to patient that we are still waiting for the CDiff PCR test to result. Pt reports that yesterday evening from 5pm-9pm she had explosive diarrhea that was foul smelling.    Patient states today her stool urgency is better, she has taken 3 doses of her flagyl  already. Pt also states that the area below umbilicus is less red compared to yesterday. Pt denies fever, chills or pain. Pt is aware to call if symptoms worsen.

## 2023-09-17 NOTE — Telephone Encounter (Signed)
-----   Message from Eleanor JONETTA Epps sent at 09/17/2023  2:05 PM EDT ----- Please let her know the PCR came back positive. Would review c diff precautions to prevent spread and to prevent re-infection.  At home: -If you have C. diff infection or are caring for someone with C. diff infection, wash your hands with soap and water  every time you use the bathroom and before you eat. Remind relatives and friends  taking care of you to do the same. -Try to use a separate bathroom if you have diarrhea from C. diff infection. If you can't, be sure the commonly touched surfaces in the bathroom are cleaned before others use it. -Take showers and wash with soap to remove any C. diff germs you could have on your body.  If someone in your house has C. diff infection, focus on regularly cleaning items that you touch with your hands, including:  Advertising account planner flushers and toilet seats Laundry What to wash Bed linens Towels Household linens Clothing, especially underwear When to wash Before others use items that people with C. diff infection may have touched  Avoid getting C. diff infection again -About 1 in 9 people who get C. diff infection will get it again in the subsequent 2-8 weeks. This can be a relapse of their original infection, or it can happen when they come in contact with C.  diff again. ----- Message ----- From: [facility], [facility] [place] Sent: 09/16/2023   1:56 PM EDT To: Eleanor JONETTA Epps, NP

## 2023-09-20 ENCOUNTER — Telehealth: Payer: Self-pay | Admitting: *Deleted

## 2023-09-20 LAB — STOOL CULTURE REFLEX - RSASHR

## 2023-09-20 LAB — STOOL CULTURE: E coli, Shiga toxin Assay: NEGATIVE

## 2023-09-20 LAB — STOOL CULTURE REFLEX - CMPCXR

## 2023-09-20 NOTE — Telephone Encounter (Signed)
 Spoke with Ms. Roussell who reports that she has had no stools on Saturday, Sunday and today. Pt started her flagyl  on Thursday and continues to finish her Rx. Pt is eating a little and is worried about now getting constipated and would like to drink some prune juice this evening?  Relayed message from [name], NP that patient can have prune juice if she would like but NO laxatives.   Pt also states she is experiencing a twinge pulling pain that is deep in her right lower pelvis/abdomen. Reports the pain is a 3/10 and she is taking tylenol  and using heating pad. Advised patient to continue following all activity restrictions and the office would reach back out tomorrow to check on symptoms. Pt verbalized understanding and thanked the office.

## 2023-09-21 NOTE — Telephone Encounter (Signed)
 Spoke with Debra Nguyen who states she is still having the right lower abdominal quadrant pain that patient describes as a deep, twinge and achy. Pt rates the pain today a 4/10, has increased from 3/10 from yesterday.    She denies fever, chills, nausea and vomiting. Patient also states this is her 4 day without a bowel movement since having C-diff diarrhea.   She is using a heating pad and has taken extra strength tylenol  this morning for the discomfort. Advised patient her message will be relayed to providers and the office will call back with recommendations.

## 2023-09-21 NOTE — Telephone Encounter (Signed)
 Spoke with Debra Nguyen who called the office stating she has had a bowel movement that was a brown soft stool. Pt is still taking her flagyl  as prescribed.   Pt reports that the pain in her right lower abdomen is tolerable but not going away and no change even after having bowel movement. She states it started last Wednesday after a sneeze.  Pt has an appointment tomorrow for labs and visit with [name]. Advised patient her message will be relayed to providers.

## 2023-09-22 ENCOUNTER — Other Ambulatory Visit

## 2023-09-22 ENCOUNTER — Inpatient Hospital Stay

## 2023-09-22 ENCOUNTER — Encounter: Payer: Self-pay | Admitting: Oncology

## 2023-09-22 ENCOUNTER — Inpatient Hospital Stay: Attending: Nurse Practitioner | Admitting: Oncology

## 2023-09-22 ENCOUNTER — Other Ambulatory Visit (HOSPITAL_BASED_OUTPATIENT_CLINIC_OR_DEPARTMENT_OTHER): Payer: Self-pay

## 2023-09-22 ENCOUNTER — Other Ambulatory Visit: Payer: Self-pay | Admitting: Gynecologic Oncology

## 2023-09-22 VITALS — BP 130/88 | HR 88 | Temp 97.8°F | Resp 18 | Ht 67.0 in | Wt 121.5 lb

## 2023-09-22 DIAGNOSIS — C569 Malignant neoplasm of unspecified ovary: Secondary | ICD-10-CM

## 2023-09-22 DIAGNOSIS — R971 Elevated cancer antigen 125 [CA 125]: Secondary | ICD-10-CM | POA: Diagnosis not present

## 2023-09-22 DIAGNOSIS — C579 Malignant neoplasm of female genital organ, unspecified: Secondary | ICD-10-CM

## 2023-09-22 DIAGNOSIS — Z90710 Acquired absence of both cervix and uterus: Secondary | ICD-10-CM | POA: Insufficient documentation

## 2023-09-22 DIAGNOSIS — Z9221 Personal history of antineoplastic chemotherapy: Secondary | ICD-10-CM | POA: Insufficient documentation

## 2023-09-22 DIAGNOSIS — Z90722 Acquired absence of ovaries, bilateral: Secondary | ICD-10-CM | POA: Insufficient documentation

## 2023-09-22 DIAGNOSIS — A0471 Enterocolitis due to Clostridium difficile, recurrent: Secondary | ICD-10-CM | POA: Insufficient documentation

## 2023-09-22 DIAGNOSIS — C784 Secondary malignant neoplasm of small intestine: Secondary | ICD-10-CM | POA: Diagnosis not present

## 2023-09-22 DIAGNOSIS — Z9079 Acquired absence of other genital organ(s): Secondary | ICD-10-CM | POA: Insufficient documentation

## 2023-09-22 DIAGNOSIS — C5701 Malignant neoplasm of right fallopian tube: Secondary | ICD-10-CM | POA: Diagnosis not present

## 2023-09-22 DIAGNOSIS — R1084 Generalized abdominal pain: Secondary | ICD-10-CM

## 2023-09-22 DIAGNOSIS — C786 Secondary malignant neoplasm of retroperitoneum and peritoneum: Secondary | ICD-10-CM | POA: Insufficient documentation

## 2023-09-22 LAB — CMP (CANCER CENTER ONLY)
ALT: 23 U/L (ref 0–44)
AST: 26 U/L (ref 15–41)
Albumin: 4.2 g/dL (ref 3.5–5.0)
Alkaline Phosphatase: 118 U/L (ref 38–126)
Anion gap: 13 (ref 5–15)
BUN: <5 mg/dL — ABNORMAL LOW (ref 8–23)
CO2: 24 mmol/L (ref 22–32)
Calcium: 9.6 mg/dL (ref 8.9–10.3)
Chloride: 103 mmol/L (ref 98–111)
Creatinine: 0.63 mg/dL (ref 0.44–1.00)
GFR, Estimated: >60 mL/min (ref >60)
Glucose, Bld: 106 mg/dL — ABNORMAL HIGH (ref 70–99)
Potassium: 3.3 mmol/L — ABNORMAL LOW (ref 3.5–5.1)
Sodium: 140 mmol/L (ref 135–145)
Total Bilirubin: 0.4 mg/dL (ref 0.0–1.2)
Total Protein: 6.6 g/dL (ref 6.5–8.1)

## 2023-09-22 LAB — CBC WITH DIFFERENTIAL (CANCER CENTER ONLY)
Abs Immature Granulocytes: 0 K/uL (ref 0.00–0.07)
Basophils Absolute: 0.1 K/uL (ref 0.0–0.1)
Basophils Relative: 2 %
Eosinophils Absolute: 0.3 K/uL (ref 0.0–0.5)
Eosinophils Relative: 9 %
HCT: 35.3 % — ABNORMAL LOW (ref 36.0–46.0)
Hemoglobin: 11.6 g/dL — ABNORMAL LOW (ref 12.0–15.0)
Immature Granulocytes: 0 %
Lymphocytes Relative: 36 %
Lymphs Abs: 1.3 K/uL (ref 0.7–4.0)
MCH: 30.7 pg (ref 26.0–34.0)
MCHC: 32.9 g/dL (ref 30.0–36.0)
MCV: 93.4 fL (ref 80.0–100.0)
Monocytes Absolute: 0.3 K/uL (ref 0.1–1.0)
Monocytes Relative: 9 %
Neutro Abs: 1.5 K/uL — ABNORMAL LOW (ref 1.7–7.7)
Neutrophils Relative %: 44 %
Platelet Count: 428 K/uL — ABNORMAL HIGH (ref 150–400)
RBC: 3.78 MIL/uL — ABNORMAL LOW (ref 3.87–5.11)
RDW: 14.3 % (ref 11.5–15.5)
WBC Count: 3.5 K/uL — ABNORMAL LOW (ref 4.0–10.5)
nRBC: 0 % (ref 0.0–0.2)

## 2023-09-22 LAB — MAGNESIUM: Magnesium: 2.1 mg/dL (ref 1.7–2.4)

## 2023-09-22 MED ORDER — POTASSIUM CHLORIDE ER 10 MEQ PO CPCR
10.0000 meq | ORAL_CAPSULE | Freq: Two times a day (BID) | ORAL | 2 refills | Status: DC
  Filled 2023-09-22: qty 60, 30d supply, fill #0
  Filled 2023-10-17: qty 60, 30d supply, fill #1
  Filled 2023-11-22: qty 60, 30d supply, fill #2

## 2023-09-22 NOTE — Patient Instructions (Signed)

## 2023-09-22 NOTE — Progress Notes (Signed)
 [facility] OFFICE PROGRESS NOTE   Diagnosis: Fallopian tube carcinoma  INTERVAL HISTORY:   Ms. Aguilar underwent a bilateral salpingo-oophorectomy, radical tumor debulking, omentectomy, and rectosigmoid resection on 09/01/2023.  There was an omental cake at the anterior abdominal wall.  No ascites.  Small implants were noted on the mesentery of the small bowel and descending colon.  A left adnexal mass was adherent to the left order.  The right fallopian tube and ovary appeared normal.  The left colon was adherent to the left adnexa and a cystic lesion in the right mesorectum.  A rectosigmoid resection was performed en bloc with the left tube and ovary.  Miliary disease was stripped from the bladder peritoneum.  Thickening was resected at the pelvic sidewall.  No visible or palpable disease at the end of surgery.  She was discharged home 09/09/2023.  She was seen for staple removal 09/05/2023 and was noted to have erythema at the abdominal incision.  She was placed on Keflex . She reports the onset of severe diarrhea beginning 09/15/2023.  She was diagnosed with C. difficile and placed on Flagyl .  She reports the diarrhea has resolved.  She developed right lower abdominal pain when standing beginning a few days ago.  This occurred after coughing.  No consistent pain.  Objective:  Vital signs in last 24 hours:  Blood pressure 130/88, pulse 88, temperature 97.8 F (36.6 C), temperature source Temporal, resp. rate 18, height 5' 7 (1.702 m), weight 121 lb 8 oz (55.1 kg), last menstrual period 02/02/2011, SpO2 100%.    HEENT: No thrush Resp: Lungs clear bilaterally Cardio: Regular rate and rhythm GI: Soft, healed midline incision with faint erythema and a healing blistered area at the lower portion of the midline wound.  No mass.  No apparent abnormality at the right lower abdomen.  Nontender. Vascular: No leg edema   Portacath/PICC-without erythema  Lab Results:  Lab Results   Component Value Date   WBC 3.5 (L) 09/22/2023   HGB 11.6 (L) 09/22/2023   HCT 35.3 (L) 09/22/2023   MCV 93.4 09/22/2023   PLT 428 (H) 09/22/2023   NEUTROABS 1.5 (L) 09/22/2023    CMP  Lab Results  Component Value Date   NA 140 09/22/2023   K 3.3 (L) 09/22/2023   CL 103 09/22/2023   CO2 24 09/22/2023   GLUCOSE 106 (H) 09/22/2023   BUN <5 (L) 09/22/2023   CREATININE 0.63 09/22/2023   CALCIUM  9.6 09/22/2023   PROT 6.6 09/22/2023   ALBUMIN  4.2 09/22/2023   AST 26 09/22/2023   ALT 23 09/22/2023   ALKPHOS 118 09/22/2023   BILITOT 0.4 09/22/2023   GFRNONAA >60 09/22/2023   GFRAA >60 02/01/2015    Lab Results  Component Value Date   CEA 5.86 (H) 05/27/2023    No results found for: INR, LABPROT  Imaging:  No results found.  Medications: I have reviewed the patient's current medications.   Assessment/Plan: Fallopian tube carcinoma, right fallopian tube CT abdomen/pelvis 05/13/2023-5.0 cm apple core lesion in the upper/mid rectum.  Peritoneal disease/omental caking with multiple cystic metastases noted as well as abdominopelvic lymphadenopathy.  Colonoscopy 05/25/2023-infiltrative partially obstructing large mass in the rectosigmoid colon from 20 to 28 cm from the anal verge.  The mass was circumferential.  Oozing was present.  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 Monia 3 cycles of chemotherapy then repeat imaging, discuss if she is  a candidate for debulking surgery Cycle 2 Taxol /carboplatin  06/23/2023, Udenyca  Cycle 3 Taxol /carboplatin  07/15/2023, Udenyca  CTs 08/02/2023-moderate to marked response to therapy of lower cervical, thoracic, abdominal and pelvic nodal metastasis.  Moderate response of abdominopelvic omental/peritoneal metastasis.  Similar to minimal decrease in size of a dominant left pelvic mass.  New mild left-sided hydroureteronephrosis likely secondary to the dominant mass. Cycle 4 Taxol /carboplatin  08/04/2023, Udenyca  09/01/2023-diagnostic laparoscopy, BSO, omentectomy, rectosigmoid resection, pelvic sidewall biopsy, bladder peritoneum biopsy, sigmoid nodule, small bowel mesenteric nodule resection.  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 difficile colitis 09/16/2023      Disposition: Ms. 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  She underwent cytoreductive surgery and was noted to have tumor involving the right fallopian tube, right and left ovary, rectosigmoid colon, and a sigmoid colon nodule.  Fibrotic tissue with treatment effect was noted at the pelvic sidewall, bladder peritoneum, and small bowel mesentery.  She is recovering from surgery.  She has been diagnosed with C. difficile colitis.  Her symptoms have improved with Flagyl .  She will complete the course of Flagyl  as recommended by gynecologic oncology.  Ms.  Seki has experienced a good response to paclitaxel /carboplatin .  She will continue paclitaxel /carboplatin .  She will return for an office visit and cycle 4 chemotherapy on 2025.  The right lower abdominal discomfort is likely related to postsurgical pain or a benign musculoskeletal condition.  She will follow-up with the surgical service if the pain does not improve.    [name], MD  09/22/2023  11:00 AM

## 2023-09-22 NOTE — Telephone Encounter (Signed)
 Spoke with patient who is still experiencing right lower abdominal pain. Pt reports the pain is a little less today than yesterday, but it's consistent especially when she walks and is up and around. the pain is a deep pulling pain. Rates the pain 3/10.   Advised patient that Dr.Tucker ordered a CT scan of abd/pelvis for tomorrow 8/8 at [facility]. Pt to arrive for check in at 12:30 and then to start oral contrast. Pt verbalized understanding and thanked the office for calling.

## 2023-09-22 NOTE — Telephone Encounter (Signed)
Attempted to reach patient. Left voicemail requesting call back.

## 2023-09-23 ENCOUNTER — Encounter: Payer: Self-pay | Admitting: Oncology

## 2023-09-23 ENCOUNTER — Ambulatory Visit (HOSPITAL_COMMUNITY)
Admission: RE | Admit: 2023-09-23 | Discharge: 2023-09-23 | Disposition: A | Discharge status: Home / Self Care | Source: Ambulatory Visit | Attending: Gynecologic Oncology | Admitting: Gynecologic Oncology

## 2023-09-23 ENCOUNTER — Encounter: Payer: Self-pay | Admitting: *Deleted

## 2023-09-23 DIAGNOSIS — N289 Disorder of kidney and ureter, unspecified: Secondary | ICD-10-CM | POA: Diagnosis not present

## 2023-09-23 DIAGNOSIS — R109 Unspecified abdominal pain: Secondary | ICD-10-CM | POA: Diagnosis not present

## 2023-09-23 DIAGNOSIS — N200 Calculus of kidney: Secondary | ICD-10-CM | POA: Diagnosis not present

## 2023-09-23 DIAGNOSIS — R188 Other ascites: Secondary | ICD-10-CM | POA: Diagnosis not present

## 2023-09-23 DIAGNOSIS — R1084 Generalized abdominal pain: Secondary | ICD-10-CM | POA: Insufficient documentation

## 2023-09-23 DIAGNOSIS — C579 Malignant neoplasm of female genital organ, unspecified: Secondary | ICD-10-CM | POA: Diagnosis not present

## 2023-09-23 LAB — CA 125: Cancer Antigen (CA) 125: 12.9 U/mL (ref 0.0–38.1)

## 2023-09-23 MED ORDER — IOHEXOL 9 MG/ML PO SOLN
1000.0000 mL | ORAL | Status: AC
  Administered 2023-09-23: 1000 mL via ORAL

## 2023-09-23 MED ORDER — IOHEXOL 300 MG/ML  SOLN
100.0000 mL | Freq: Once | INTRAMUSCULAR | Status: AC | PRN
  Administered 2023-09-23: 80 mL via INTRAVENOUS

## 2023-09-23 MED ORDER — IOHEXOL 9 MG/ML PO SOLN
ORAL | Status: AC
Start: 2023-09-23 — End: 2023-09-23
  Filled 2023-09-23: qty 1000

## 2023-09-27 NOTE — Telephone Encounter (Signed)
 Spoke with Debra Nguyen who states the Robaxin  is helping and this morning is the first time she has felt better in regards to the pain in her lower pelvis/abdomen.   Pt states she is still having loose stools with urgency but only 2-4 times a day,( so still decreasing) they are not watery and she completed her flagyl  prescription.   Advised patient to call the office with any increased loose/watery diarrhea stool with or without other symptoms. Pt verbalized understanding and thanked the office for calling.

## 2023-09-28 ENCOUNTER — Telehealth: Payer: Self-pay | Admitting: *Deleted

## 2023-09-28 NOTE — Telephone Encounter (Signed)
 LMOM for the patient to call the office back. Patient to be offered an earlier appt in the week

## 2023-09-29 ENCOUNTER — Other Ambulatory Visit: Payer: Self-pay | Admitting: Gynecologic Oncology

## 2023-09-29 ENCOUNTER — Other Ambulatory Visit: Payer: Self-pay | Admitting: *Deleted

## 2023-09-29 ENCOUNTER — Telehealth: Payer: Self-pay

## 2023-09-29 ENCOUNTER — Other Ambulatory Visit (HOSPITAL_BASED_OUTPATIENT_CLINIC_OR_DEPARTMENT_OTHER): Payer: Self-pay

## 2023-09-29 DIAGNOSIS — C569 Malignant neoplasm of unspecified ovary: Secondary | ICD-10-CM

## 2023-09-29 DIAGNOSIS — A0472 Enterocolitis due to Clostridium difficile, not specified as recurrent: Secondary | ICD-10-CM

## 2023-09-29 MED ORDER — VANCOMYCIN HCL 125 MG PO CAPS
125.0000 mg | ORAL_CAPSULE | Freq: Four times a day (QID) | ORAL | 0 refills | Status: DC
  Filled 2023-09-29: qty 40, 10d supply, fill #0

## 2023-09-29 NOTE — Telephone Encounter (Signed)
 Appointment on Friday 8/15 afternoon with Dr.Tucker.   Ms.Leathers called stating her loose,brown stool has picked back up, worse since phone call on Monday 8/11 with Ami. She finished Flagyl  on Saturday, she is going 3-6 times daily. It seems worse in the evening and all through the night. As soon as she gets back in the bed it's urgent for her to go again and lasts all night long. No fever/chills. Eating/drinking ok but afraid to now that it has all gotten worse again. Still taking Robaxin  2x daily (sometimes 3 times)   Reports starting Chemo back next week on Wednesday and thinks she won't be able to if she still has C-diff. She has collected another sample in case we need to test it again.   Aware Dr.Tucker and Melissa are in the OR, message will be sent and office will call her back with a plan.

## 2023-09-29 NOTE — Progress Notes (Signed)
 Given increase in symptoms and after discussion with pharmacy, plan for initiation of oral vancomycin  for a total of 10 days.

## 2023-09-29 NOTE — Telephone Encounter (Signed)
 Pt is aware of Vancomycin  being sent to pharmacy. She will continue it for 10 days.  Aware to keep the appointment on Friday with Dr.Tucker

## 2023-09-30 ENCOUNTER — Encounter: Payer: Self-pay | Admitting: Oncology

## 2023-10-01 ENCOUNTER — Encounter: Payer: Self-pay | Admitting: Oncology

## 2023-10-01 ENCOUNTER — Encounter: Payer: Self-pay | Admitting: Gynecologic Oncology

## 2023-10-01 ENCOUNTER — Inpatient Hospital Stay (HOSPITAL_BASED_OUTPATIENT_CLINIC_OR_DEPARTMENT_OTHER): Admitting: Gynecologic Oncology

## 2023-10-01 ENCOUNTER — Encounter: Payer: Self-pay | Admitting: *Deleted

## 2023-10-01 VITALS — BP 133/75 | HR 85 | Temp 98.1°F | Resp 18 | Wt 121.0 lb

## 2023-10-01 DIAGNOSIS — C579 Malignant neoplasm of female genital organ, unspecified: Secondary | ICD-10-CM

## 2023-10-01 DIAGNOSIS — R197 Diarrhea, unspecified: Secondary | ICD-10-CM

## 2023-10-01 DIAGNOSIS — A0472 Enterocolitis due to Clostridium difficile, not specified as recurrent: Secondary | ICD-10-CM

## 2023-10-01 NOTE — Progress Notes (Signed)
 Patient has C. Diff again and started treatment on 8/13. Has chemo on 8/20. Per [name], move 8/20 out 2 weeks. Can see NP or MD on tx day as well. High priority scheduling message sent for change.

## 2023-10-01 NOTE — Progress Notes (Signed)
 Gynecologic Oncology Return Clinic Visit  10/01/23  Reason for Visit: follow-up, treatment planning  Treatment History: Oncology History  Gynecologic cancer [facility])  05/27/2023 Cancer Staging   Staging form: Ovary, Fallopian Tube, and Primary Peritoneal Carcinoma, AJCC 8th Edition - Clinical: FIGO Stage IV (cTX, cN1, cM1) - Signed by Cloretta Arley NOVAK, MD on 05/27/2023 Stage prefix: Initial diagnosis   06/02/2023 -  Chemotherapy   Patient is on Treatment Plan : OVARIAN Carboplatin  (AUC 6) + Paclitaxel  (175) q21d X 6 Cycles     Fallopian tube cancer, carcinoma (HCC)  05/27/2023 Initial Diagnosis   Gynecologic cancer (HCC)   07/15/2023 Genetic Testing   Negative genetic testing on the CancerNext-Expanded+RNAinsight panel.  The report date is Jul 14, 2023.  The CancerNext-Expanded gene panel offered by [facility] and includes sequencing, rearrangement, and RNA analysis for the following 77 genes: AIP, ALK, APC, ATM, BAP1, BARD1, BMPR1A, BRCA1, BRCA2, BRIP1, CDC73, CDH1, CDK4, CDKN1B, CDKN2A, CEBPA, CHEK2, CTNNA1, DDX41, DICER1, ETV6, FH, FLCN, GATA2, LZTR1, MAX, MBD4, MEN1, MET, MLH1, MSH2, MSH3, MSH6, MUTYH, NF1, NF2, NTHL1, PALB2, PHOX2B, PMS2, POT1, PRKAR1A, PTCH1, PTEN, RAD51C, RAD51D, RB1, RET, RPS20, RUNX1, SDHA, SDHAF2, SDHB, SDHC, SDHD, SMAD4, SMARCA4, SMARCB1, SMARCE1, STK11, SUFU, TMEM127, TP53, TSC1, TSC2, VHL, and WT1 (sequencing and deletion/duplication); AXIN2, CTNNA1, DDX41, EGFR, HOXB13, KIT, MBD4, MITF, MSH3, PDGFRA, POLD1 and POLE (sequencing only); EPCAM and GREM1 (deletion/duplication only). RNA data is routinely analyzed for use in variant interpretation for all genes.    09/01/2023 Surgery   Diagnostic laparoscopy, conversion to exploratory laparotomy with radical tumor debulking including total omentectomy, right salpingo-oophorectomy, en bloc resection of left tube and ovary with rectosigmoid colon with rectosigmoid reanastomosis, stripping of bladder peritoneum, resection  of right deep pelvic nodule, excision of implant and small bowel mesenteric implant   Findings: On diagnostic laparoscopy, normal upper abdominal survey including diaphragm, liver edge.  Omental cake adherent with filmy adhesions to the anterior abdominal wall in the mid abdomen on the left.  Otherwise, small bowel and small bowel mesentery appears normal.  Within the pelvis, sigmoid colon draped over large left-sided pelvic mass.  On laparotomy, diaphragm and liver surfaces smooth on palpation.  Stomach normal in appearance.  Several smaller areas of omental cake with treatment effect noted within the infracolic omentum, few thickened areas within the infra gastric omentum.  Lesser sac without evidence of disease.  Normal spleen.  No ascites.  Small implant on mesentery of the small bowel, implant on the descending colon.  Within the pelvis, the sigmoid colon was draped over the left adnexa that had a multiloculated cystic lesion measuring approximately 10 cm that was partially retroperitonealized and adherent to the left ureter with retroperitoneal fibrosis noted.  On the right, normal-appearing fallopian tube and ovary.  Some fibrosis also within the right retroperitoneum.  The colon itself was somewhat redundant within the pelvis and after it was freed from the left sidewall where it was draped over the left tube and ovary, it was adherent in multiple places to the left adnexa and to a cystic lesion within the right mesorectum.  Given findings, decision made to proceed with rectosigmoid resection en bloc with the left tube and ovary.  Ultimately, given adherence of the cystic lesion to the right lateral aspect of the rectum, the rectum was taken above the peritoneal reflection but approximately 11 cm from the anal verge.  Small amount of of miliary disease noted on the bladder peritoneum, which was stripped.  There was some thickening along  the deep pelvic sidewall inferior to the right ureter that was  subsequently resected.  No palpable adenopathy.  Bubble test was negative, good perfusion to colon after ICG administration. At the end of surgery, no visible or palpable disease.    09/01/2023 Pathology Results   A. OMENTUM, RESECTION: Foci of high-grade carcinoma associated with numerous some psammomatous calcifications and extensive fibrosis consistent with treatment response. Largest grossly measured nodule is 2.3 cm.  B. FALLOPIAN TUBE AND OVARY, RIGHT: High-grade serous carcinoma involving right fallopian tube and right ovary consistent with fallopian tube primary. Serous tubal intraepithelial carcinoma (STIC) adjacent to invasive carcinoma. See oncology table and comment.  C.  LEFT FALLOPIAN TUBE AND OVARY WITH RECTOSIGMOID COLON, RESECTION: High-grade carcinoma involving colon and left ovary associated with numerous psammomatous calcifications and fibrosis consistent with treatment response. Metastatic carcinoma in four of twenty lymph nodes (4/20).  D. RIGHT DEEP PELVIC SIDEWALL, BIOPSY: Fibrosis with a few psammomatous calcifications and rare atypical cells.  E. PROXIMAL DONUT: Benign colon.  F. DISTAL DONUT: Benign colon.  G. BLADDER PERITONEUM, BIOPSY: Fibrosis with a few psammomatous calcifications and mild inflammation consistent with treatment response. No residual viable tumor cells identified.  H. SIGMOID NODULE: High-grade carcinoma associated with numerous psammomatous calcifications and fibrosis consistent with treatment response.  I. SMALL BOWEL MESENTERIC NODULE: Fibrotic nodule with numerous psammomatous calcifications consistent with treatment response. Negative for residual viable tumor cells.  ONCOLOGY TABLE: FALLOPIAN TUBE: Resection Procedure: Diagnostic laparoscopy, bilateral salpingo-oophorectomy with debulking including bowel surgery Specimen Integrity: Intact Tumor Site: Right fallopian tube, see comment Tumor Size: 2 cm, see  comment Histologic Type: High-grade serous carcinoma Histologic Grade: FIGO grade 3 Ovarian Surface Involvement: Present, bilateral Fallopian Tube Surface Involvement: Present Implants: Present Lymphatic and/or Vascular Invasion: Present Other Tissue/ Organ Involvement: Omentum, rectosigmoid colon, left ovary, pelvic sidewall, bladder peritoneum, sigmoid nodule and small bowel mesenteric nodule Largest Extrapelvic Peritoneal Focus: 2.3 cm, omentum Peritoneal/Ascitic Fluid Involvement: Not applicable Chemotherapy Response Score (CRS): CRS 2 Regional Lymph Nodes:      Number of Nodes with Metastasis Greater than 10 mm: 1      Number of Nodes with Metastasis 10 mm or Less (excludes isolated tumor cells): 3      Number of Nodes with Isolated Tumor Cells (0.2 mm or less): 0      Number of Lymph Nodes Examined: 20 Pathologic Stage Classification (pTNM, AJCC 8th Edition): ypT3c, ypN1b, see comment Ancillary Studies: Can be performed if requested Representative Tumor Block: B1, C4 and C7 Comment(s): The nodules of residual carcinoma are associated with numerous psammomatous calcifications and fibrosis consistent with treatment response.  The right fallopian tube and right ovary are involved by invasive high-grade carcinoma and the right fallopian tube has an adjacent focus of serous tubal intraepithelial carcinoma (STIC) and therefore tumor is staged as a right fallopian tube primary.  The greatest tumor dimension in the right fallopian tube is estimated from the histologic slides and 2 cm.      Interval History: The patient presents today overall not feeling very well.  She is feeling defeated with regard to recurrent C. difficile infection.  Continues to have loose stool.  Had 2 episodes of loose stool this morning.  Has not had a bowel movement this afternoon.  Was up most of the night with feelings of urgency although did not have a lot of bowel function.  Appetite is still poor,  trying to force herself to eat eat and eating things that sound good.  The  pain that she was having in her right mid abdomen has significantly improved and taking Robaxin  has helped.  Moving around without difficulty although feels weak and sometimes feels dizzy.  Has not restarted her hydrochlorothiazide  due to concerns that it will drop her blood pressure too much.  Past Medical/Surgical History: Past Medical History:  Diagnosis Date   Allergy    Anxiety    Through divorce   Arthritis    ASCUS with positive high risk HPV 12/2006   Negative Colpo BX   ASCUS with positive high risk HPV 2009   Chronic kidney disease    kidney stones   CIN III (cervical intraepithelial neoplasia III) 07/1983   tx'd w/cryo- no subsequent abnormal paps until 2008   Essential hypertension    Family history of colon cancer    Fibroid 2006   3 cm Right Fundal Fibroid   Genetic testing 07/15/2023   Negative genetic testing on the CancerNext-Expanded+RNAinsight panel.  The report date is Jul 14, 2023.  The CancerNext-Expanded gene panel offered by [facility] and includes sequencing, rearrangement, and RNA analysis for the following 77 genes: AIP, ALK, APC, ATM, BAP1, BARD1, BMPR1A, BRCA1, BRCA2, BRIP1, CDC73, CDH1, CDK4, CDKN1B, CDKN2A, CEBPA, CHEK2, CTNNA1, DDX41, DICER1, ETV6, FH, FL*   GERD (gastroesophageal reflux disease) 2011   History of kidney stones    Hypertension    Osteopenia 2020   Synovial cyst of lumbar spine 10/2014   Vitamin D  deficiency     Past Surgical History:  Procedure Laterality Date   ABDOMINAL HYSTERECTOMY  02/23/2011   R TLH-fibroids, adenomyosis 221 g, & focal CIN I w/clear margins   ANTERIOR CERVICAL DECOMP/DISCECTOMY FUSION N/A 07/19/2012   Procedure: ANTERIOR CERVICAL DECOMPRESSION/DISCECTOMY FUSION 2 LEVELS;  Surgeon: Fairy Levels, MD;  Location: [facility];  Service: Neurosurgery;  Laterality: N/A;  Anterior Cervical Five-Six Cervical Six-Seven Anterior Cervical  Decompression/Diskectomy/Fusion   cataracts Bilateral 03/2022   COLONOSCOPY     CYSTOSCOPY  02/23/2011   PT.STATES SHE DID NOT HAVE   dental implants     IR IMAGING GUIDED PORT INSERTION  05/31/2023   LAPAROSCOPY N/A 09/01/2023   Procedure: LAPAROSCOPY, DIAGNOSTIC;  Surgeon: Viktoria Comer SAUNDERS, MD;  Location: [facility];  Service: Gynecology;  Laterality: N/A;  DIAGNOSTIC LAPAROSCOPY, OPEN BSO, DEBULKING INCLUDING BOWEL SURGERY   LASIK  '99   LUMBAR LAMINECTOMY/DECOMPRESSION MICRODISCECTOMY Right 02/05/2015   Procedure: Right Lumbar four-five Laminectomy with Resection of Synovial Cyst ;  Surgeon: Fairy Levels, MD;  Location: [facility];  Service: Neurosurgery;  Laterality: Right;   MOHS SURGERY     x 2   MOLE REMOVAL  2023   REPLACEMENT TOTAL KNEE Right 09/2021   SALPINGOOPHORECTOMY N/A 09/01/2023   Procedure: BILATERAL SALPINGO-OOPHORECTOMY, OPEN, EX LAP WITH RADICAL TUMOR DEBULKING, OMENTECTOMY, RECTOSIGMOID RESECTION AND ANASTOMOSIS;  Surgeon: Viktoria Comer SAUNDERS, MD;  Location: [facility];  Service: Gynecology;  Laterality: N/A;   TONSILLECTOMY AND ADENOIDECTOMY     VAGINAL DELIVERY  '92 '94    Family History  Problem Relation Age of Onset   Hypertension Mother    Heart disease Mother    Lung cancer Mother    Hypertension Father    Heart attack Father    Colon cancer Neg Hx    Esophageal cancer Neg Hx    Pancreatic cancer Neg Hx    Prostate cancer Neg Hx    Rectal cancer Neg Hx    Stomach cancer Neg Hx    Breast cancer Neg Hx  Social History   Socioeconomic History   Marital status: Divorced    Spouse name: Not on file   Number of children: 2   Years of education: Not on file   Highest education level: Not on file  Occupational History   Occupation: Charity fundraiser    Employer: [employer]  Tobacco Use   Smoking status: Never   Smokeless tobacco: Never  Vaping Use   Vaping status: Never Used  Substance and Sexual Activity   Alcohol use: Yes    Alcohol/week: 10.0 - 12.0  standard drinks of alcohol    Types: 10 - 12 Glasses of wine per week    Comment: 10-12 glasses of wine a week   Drug use: No   Sexual activity: Not Currently    Partners: Male    Birth control/protection: Post-menopausal, Surgical    Comment: R-TLH  Other Topics Concern   Not on file  Social History Narrative   Not on file   Social Drivers of Health   Financial Resource Strain: Not on file  Food Insecurity: No Food Insecurity (09/01/2023)   Hunger Vital Sign    Worried About Running Out of Food in the Last Year: Never true    Ran Out of Food in the Last Year: Never true  Transportation Needs: No Transportation Needs (09/01/2023)   PRAPARE - Administrator, Civil Service (Medical): No    Lack of Transportation (Non-Medical): No  Physical Activity: Not on file  Stress: Not on file  Social Connections: Unknown (09/01/2023)   Social Connection and Isolation Panel    Frequency of Communication with Friends and Family: More than three times a week    Frequency of Social Gatherings with Friends and Family: Twice a week    Attends Religious Services: More than 4 times per year    Active Member of [employer] or Organizations: Yes    Attends Banker Meetings: 1 to 4 times per year    Marital Status: Patient declined    Current Medications:  Current Outpatient Medications:    acetaminophen  (TYLENOL ) 500 MG tablet, Take 1,000 mg by mouth every 6 (six) hours as needed for moderate pain (pain score 4-6)., Disp: , Rfl:    ALPRAZolam  (XANAX ) 0.25 MG tablet, Take 1 tablet (0.25 mg total) by mouth at bedtime as needed for anxiety., Disp: 30 tablet, Rfl: 0   apixaban  (ELIQUIS ) 2.5 MG TABS tablet, Take 1 tablet (2.5 mg total) by mouth 2 (two) times daily., Disp: 40 tablet, Rfl: 0   Calcium  Carb-Cholecalciferol (CALCIUM  600+D) 600-10 MG-MCG TABS, Take 1 tablet by mouth daily. (Patient not taking: Reported on 09/22/2023), Disp: , Rfl:    doxylamine , Sleep, (UNISOM ) 25 MG tablet,  Take 12.5 mg by mouth at bedtime. (Patient not taking: Reported on 09/22/2023), Disp: , Rfl:    hydrochlorothiazide  (HYDRODIURIL ) 12.5 MG tablet, Take 1 tablet (12.5 mg total) by mouth every morning., Disp: 90 tablet, Rfl: 1   lidocaine -prilocaine  (EMLA ) cream, Apply 1 Application topically as needed (Apply to port 1-2 hours prior to its use)., Disp: 30 g, Rfl: 0   losartan  (COZAAR ) 25 MG tablet, Take 1 tablet (25 mg total) by mouth daily., Disp: 100 tablet, Rfl: 3   Multiple Vitamins-Minerals (MULTIVITAMINS THER. W/MINERALS) TABS, Take 1 tablet by mouth daily. (Patient not taking: Reported on 09/22/2023), Disp: , Rfl:    ondansetron  (ZOFRAN -ODT) 4 MG disintegrating tablet, Dissolve 1 tablet (4 mg total) by mouth every 8 (eight) hours as needed for nausea  or vomiting. (Patient not taking: Reported on 09/22/2023), Disp: 20 tablet, Rfl: 0   potassium chloride  (MICRO-K ) 10 MEQ CR capsule, Take 1 capsule (10 mEq total) by mouth 2 (two) times daily., Disp: 60 capsule, Rfl: 2   prochlorperazine  (COMPAZINE ) 10 MG tablet, Take 1 tablet (10 mg total) by mouth every 6 (six) hours as needed for nausea or vomiting. (Patient not taking: Reported on 09/22/2023), Disp: 30 tablet, Rfl: 3   rosuvastatin  (CRESTOR ) 5 MG tablet, Take 1 tablet (5 mg total) by mouth daily., Disp: 90 tablet, Rfl: 3   traMADol  (ULTRAM ) 50 MG tablet, Take 1 tablet (50 mg total) by mouth every 6 (six) hours as needed for severe pain (pain score 7-10). For AFTER surgery only, do not take and drive (Patient not taking: Reported on 09/22/2023), Disp: 10 tablet, Rfl: 0   vancomycin  (VANCOCIN ) 125 MG capsule, Take 1 capsule (125 mg total) by mouth 4 (four) times daily., Disp: 40 capsule, Rfl: 0  Review of Systems: Denies appetite changes, fevers, chills, fatigue, unexplained weight changes. Denies hearing loss, neck lumps or masses, mouth sores, ringing in ears or voice changes. Denies cough or wheezing.  Denies shortness of breath. Denies chest pain or  palpitations. Denies leg swelling. Denies abdominal distention, pain, blood in stools, constipation, diarrhea, nausea, vomiting, or early satiety. Denies pain with intercourse, dysuria, frequency, hematuria or incontinence. Denies hot flashes, pelvic pain, vaginal bleeding or vaginal discharge.   Denies joint pain, back pain or muscle pain/cramps. Denies itching, rash, or wounds. Denies dizziness, headaches, numbness or seizures. Denies swollen lymph nodes or glands, denies easy bruising or bleeding. Denies anxiety, depression, confusion, or decreased concentration.  Physical Exam: BP 133/75 (BP Location: Left Arm, Patient Position: Sitting)   Pulse 85   Temp 98.1 F (36.7 C) (Oral)   Resp 18   Wt 121 lb (54.9 kg)   LMP 02/02/2011   SpO2 99%   BMI 18.95 kg/m  General: Alert, oriented, no acute distress. HEENT: Posterior oropharynx clear, sclera anicteric. Chest: Unlabored breathing on room air Abdomen: soft, nontender. No masses or hepatosplenomegaly appreciated.  Healed incision, Steri-Strips removed. Extremities: Grossly normal range of motion.  Warm, well perfused.  No edema bilaterally.  Laboratory & Radiologic Studies: None new  Assessment & Plan: Mileidy Atkin is a 68 y.o. 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 Postoperative course complicated by C. difficile infection.  Patient is overall doing well from a postoperative standpoint although continues to struggle from the deaf infection.  Discussed reaching out to pharmacy and infectious disease to make sure that we have her on appropriate therapy and to discuss if she needs any maintenance therapy to help prevent recurrent infection.  She has only been taking vancomycin  now for 2-1/2 days.  Given colitis, I will reach out to [name] to recommend that we delay starting treatment 1 additional week to let her recover.  We  reviewed her CT scan again.  I had talked to her about these results by phone.  I am very suspicious that the inflammation seen in her rectum is related to her ongoing C. difficile infection.  We will plan to reassess when she gets repeat imaging after finishing chemotherapy.  Patient previously had negative germline genetic testing.  We will request that somatic testing be sent.  I will plan to see the patient for follow-up in 4 months.  28 minutes of total time was  spent for this patient encounter, including preparation, face-to-face counseling with the patient and coordination of care, and documentation of the encounter.  Comer Dollar, MD  Division of Gynecologic Oncology  Department of Obstetrics and Gynecology  [facility]

## 2023-10-03 ENCOUNTER — Other Ambulatory Visit: Payer: Self-pay | Admitting: Oncology

## 2023-10-04 ENCOUNTER — Encounter: Payer: Self-pay | Admitting: *Deleted

## 2023-10-04 ENCOUNTER — Telehealth: Payer: Self-pay | Admitting: *Deleted

## 2023-10-04 ENCOUNTER — Telehealth: Payer: Self-pay | Admitting: Oncology

## 2023-10-04 NOTE — Progress Notes (Signed)
 [facility] testing requested for [phone] through pathology department

## 2023-10-04 NOTE — Telephone Encounter (Signed)
 Pt called the office and all appts have been adjusted.

## 2023-10-04 NOTE — Telephone Encounter (Signed)
 Ms. Krygier states that she is feeling better. She is sleeping well, hydrating and her po intake is increasing. She denies taking any PPI's or lomotil Has not had BM since Saturday night. Per [name] NP it is ok to try a small amount of prune juice, if no Bm persists, but not an excessive amount. Also ID recommends 10 day course of Vancomycin  with no taper. Pt will call the office with any further questions or concerns

## 2023-10-04 NOTE — Telephone Encounter (Signed)
 Unable to reach via phone, voicemail was left notifying pt of appt details. Requested she call me back to confirm this appt and to schedule her next appt per IS WQ.    Scheduling Message Entered by STEVA DEVERE CROME on 10/01/2023 at  3:15 PM Priority: High <No visit type provided>  Department: [facility]  Provider:  Scheduling Notes:  Please move all her 8/20 appointments to 2 weeks out-~ 9/3. OV can be with BS, Lacie or Olam that day

## 2023-10-06 ENCOUNTER — Inpatient Hospital Stay

## 2023-10-06 ENCOUNTER — Inpatient Hospital Stay: Admitting: Oncology

## 2023-10-07 ENCOUNTER — Telehealth: Payer: Self-pay | Admitting: *Deleted

## 2023-10-07 ENCOUNTER — Other Ambulatory Visit: Payer: Self-pay | Admitting: Gynecologic Oncology

## 2023-10-07 DIAGNOSIS — A0472 Enterocolitis due to Clostridium difficile, not specified as recurrent: Secondary | ICD-10-CM

## 2023-10-07 NOTE — Telephone Encounter (Signed)
 Spoke with Debra Nguyen who states she received a call from Infectious disease and she has an appointment next Wednesday, August 27 th. At 3pm with Dr. Fayette. Pt thanked the office for calling and is thankful for this appointment prior to her starting chemotherapy.

## 2023-10-07 NOTE — Telephone Encounter (Signed)
 Spoke with Debra Nguyen who states she is still having frequent urgency and loose stools. She reports her stools are loose and softer and not watery but she is still experiencing the urgency and her frequency of her stools since not having any stools on Sunday (She did not take the prune juice) reports on Monday 5 times, Tuesday 2 times, Wednesday (yesterday) 6-7 times and today so far two times. Pt is keeping a log of the frequency of her stools.  She denies fever, pain, and no nausea or vomiting. Pt states she is also avoiding all dairy products.   Debra Nguyen states she is eating a little more and is very concerned about another reoccurrence of the c-diff. Stating that she is exhausted and doesn't want again another repeat. Pt is still taking the vancomycin  and has 8 doses left. She is verbalizing concerns about wanting to due a tapering and weaning of the vancomycin  and or possibly having to try another treatment? Pt doesn't want to delay her chemotherapy beyond Sept. 3rd. And is ready to be done with the c-diff.  Allowed patient to voice concerns, support given and advised her message would be relayed to providers. Pt thanked the office for calling and stating she was glad to get the call before the weekend.

## 2023-10-08 ENCOUNTER — Ambulatory Visit

## 2023-10-08 ENCOUNTER — Encounter: Payer: Self-pay | Admitting: Gynecologic Oncology

## 2023-10-13 ENCOUNTER — Ambulatory Visit (INDEPENDENT_AMBULATORY_CARE_PROVIDER_SITE_OTHER): Admitting: Infectious Diseases

## 2023-10-13 ENCOUNTER — Other Ambulatory Visit: Payer: Self-pay | Admitting: Internal Medicine

## 2023-10-13 ENCOUNTER — Other Ambulatory Visit: Payer: Self-pay

## 2023-10-13 ENCOUNTER — Encounter: Payer: Self-pay | Admitting: Infectious Diseases

## 2023-10-13 ENCOUNTER — Other Ambulatory Visit (HOSPITAL_COMMUNITY): Payer: Self-pay

## 2023-10-13 ENCOUNTER — Other Ambulatory Visit (HOSPITAL_BASED_OUTPATIENT_CLINIC_OR_DEPARTMENT_OTHER): Payer: Self-pay

## 2023-10-13 VITALS — BP 123/74 | HR 79 | Temp 97.4°F | Ht 67.0 in | Wt 122.0 lb

## 2023-10-13 DIAGNOSIS — C5700 Malignant neoplasm of unspecified fallopian tube: Secondary | ICD-10-CM | POA: Diagnosis not present

## 2023-10-13 DIAGNOSIS — A0472 Enterocolitis due to Clostridium difficile, not specified as recurrent: Secondary | ICD-10-CM

## 2023-10-13 DIAGNOSIS — Z1231 Encounter for screening mammogram for malignant neoplasm of breast: Secondary | ICD-10-CM

## 2023-10-13 MED ORDER — VANCOMYCIN HCL 125 MG PO CAPS
125.0000 mg | ORAL_CAPSULE | Freq: Four times a day (QID) | ORAL | 1 refills | Status: DC
  Filled 2023-10-13: qty 56, 14d supply, fill #0
  Filled 2023-10-25: qty 56, 14d supply, fill #1

## 2023-10-13 NOTE — Patient Instructions (Signed)
  You were seen today for an infectious disease consultation due to your recurrent Clostridioides difficile infection. This infection has been ongoing since your surgery in July, and despite initial treatments, your symptoms have not fully resolved. You are experiencing frequent bowel movements with urgency, which sometimes wakes you at night. There is no blood in your stool, but mucus is present during severe episodes. Your past medical history includes rectosigmoid colon cancer, for which you had surgery and chemotherapy, and a history of partial hysterectomy due to fibroids and abnormal Pap smears.  YOUR PLAN:  -CLOSTRIDIOIDES DIFFICILE INFECTION: Clostridioides difficile infection is a bacterial infection that causes severe diarrhea and other intestinal issues. Your infection has persisted despite initial treatment. We will restart vancomycin  at 125 mg every 6 hours for 7 days, then taper to 3 times a day for 1 week. Please monitor your stool frequency and adjust the dosage based on your symptoms.     INSTRUCTIONS:  Please follow the vancomycin  treatment plan as discussed. Monitor your symptoms and communicate with me via MyChart . If your symptoms do not improve or worsen, please seek medical attention.

## 2023-10-13 NOTE — Progress Notes (Signed)
 NAME: Debra Nguyen  DOB: 18-May-1955  MRN: [id number]  Date/Time: 10/13/2023 3:18 PM   Subjective:  Pt is here for cdiff diarrhea ? Debra Nguyen is a 68 y.o. with recurrent C. difficile infection who presents for infectious disease consultation. 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  She took a 5 day course of keflex  for a surgical site skin infection  and subsequently developed diarrhea and was diagnosed a C. diff infection on 09/16/23. Antigen positive, toxin neg but PCR was positive. She was given a 10 day course of flagyl  > her symptoms got a little better only to become worse. She then was prescribed a  ten-day course of vancomycin  125 mg every six hours, which she completed 3 days ago- She still has 4 soft stools/day- was around 13 at the peak She has no blood, has mucus, has night time diarrhea  Her past medical history includes rectosigmoid colon cancer, for which she underwent chemotherapy,followed by surgery with the last dose on June 18th.   She was previously diagnosed with IBS and constipation, treated with Colace and Miralax , until a CT scan and colonoscopy revealed a mass leading to her cancer diagnosis in March 2025.  She has a history of a partial hysterectomy in 2013 due to fibroids and abnormal Pap smears, with ongoing surveillance showing high-grade changes influenced by HPV. She has had abnormal Pap smears since her twenties, with a history of dysplasia.  She reports significant weight loss and is trying to manage her nutrition. She is a retired Engineer, civil (consulting) and actively manages her health, including tracking her symptoms and bowel movements.   Past Medical History:   Diagnosis Date   Allergy    Anxiety    Through divorce   Arthritis    ASCUS with positive high risk HPV 12/2006   Negative Colpo BX   ASCUS with positive high risk HPV 2009   Chronic kidney disease    kidney stones   CIN III (cervical intraepithelial neoplasia III) 07/1983   tx'd w/cryo- no subsequent abnormal paps until 2008   Essential hypertension    Family history of colon cancer    Fibroid 2006   3 cm Right Fundal Fibroid   Genetic testing 07/15/2023   Negative genetic testing on the CancerNext-Expanded+RNAinsight panel.  The report date is Jul 14, 2023.  The CancerNext-Expanded gene panel offered by [facility] and includes sequencing, rearrangement, and RNA analysis for the following 77 genes: AIP, ALK, APC, ATM, BAP1, BARD1, BMPR1A, BRCA1, BRCA2, BRIP1, CDC73, CDH1, CDK4, CDKN1B, CDKN2A, CEBPA, CHEK2, CTNNA1, DDX41, DICER1, ETV6, FH, FL*   GERD (gastroesophageal reflux disease) 2011   History of kidney stones    Hypertension    Osteopenia 2020   Synovial cyst of lumbar spine 10/2014   Vitamin D  deficiency     Past Surgical History:  Procedure Laterality Date   ABDOMINAL HYSTERECTOMY  02/23/2011   R TLH-fibroids, adenomyosis 221 g, & focal CIN I w/clear margins   ANTERIOR CERVICAL DECOMP/DISCECTOMY FUSION N/A 07/19/2012   Procedure: ANTERIOR CERVICAL DECOMPRESSION/DISCECTOMY FUSION 2 LEVELS;  Surgeon: Fairy Levels, MD;  Location: [facility];  Service: Neurosurgery;  Laterality: N/A;  Anterior Cervical Five-Six Cervical Six-Seven Anterior Cervical Decompression/Diskectomy/Fusion   cataracts Bilateral 03/2022   COLONOSCOPY  CYSTOSCOPY  02/23/2011   PT.STATES SHE DID NOT HAVE   dental implants     IR IMAGING GUIDED PORT INSERTION  05/31/2023   LAPAROSCOPY N/A 09/01/2023   Procedure: LAPAROSCOPY, DIAGNOSTIC;  Surgeon: Viktoria Comer SAUNDERS, MD;  Location: [facility];  Service: Gynecology;  Laterality: N/A;  DIAGNOSTIC LAPAROSCOPY, OPEN BSO, DEBULKING INCLUDING BOWEL  SURGERY   LASIK  '99   LUMBAR LAMINECTOMY/DECOMPRESSION MICRODISCECTOMY Right 02/05/2015   Procedure: Right Lumbar four-five Laminectomy with Resection of Synovial Cyst ;  Surgeon: Fairy Levels, MD;  Location: [facility];  Service: Neurosurgery;  Laterality: Right;   MOHS SURGERY     x 2   MOLE REMOVAL  2023   REPLACEMENT TOTAL KNEE Right 09/2021   SALPINGOOPHORECTOMY N/A 09/01/2023   Procedure: BILATERAL SALPINGO-OOPHORECTOMY, OPEN, EX LAP WITH RADICAL TUMOR DEBULKING, OMENTECTOMY, RECTOSIGMOID RESECTION AND ANASTOMOSIS;  Surgeon: Viktoria Comer SAUNDERS, MD;  Location: [facility];  Service: Gynecology;  Laterality: N/A;   TONSILLECTOMY AND ADENOIDECTOMY     VAGINAL DELIVERY  '92 '94    Social History   Socioeconomic History   Marital status: Divorced    Spouse name: Not on file   Number of children: 2   Years of education: Not on file   Highest education level: Not on file  Occupational History   Occupation: Charity fundraiser    Employer: [employer]  Tobacco Use   Smoking status: Never   Smokeless tobacco: Never  Vaping Use   Vaping status: Never Used  Substance and Sexual Activity   Alcohol use: Yes    Alcohol/week: 10.0 - 12.0 standard drinks of alcohol    Types: 10 - 12 Glasses of wine per week    Comment: 10-12 glasses of wine a week   Drug use: No   Sexual activity: Not Currently    Partners: Male    Birth control/protection: Post-menopausal, Surgical    Comment: R-TLH  Other Topics Concern   Not on file  Social History Narrative   Not on file   Social Drivers of Health   Financial Resource Strain: Not on file  Food Insecurity: No Food Insecurity (09/01/2023)   Hunger Vital Sign    Worried About Running Out of Food in the Last Year: Never true    Ran Out of Food in the Last Year: Never true  Transportation Needs: No Transportation Needs (09/01/2023)   PRAPARE - Administrator, Civil Service (Medical): No    Lack of Transportation (Non-Medical): No  Physical Activity:  Not on file  Stress: Not on file  Social Connections: Unknown (09/01/2023)   Social Connection and Isolation Panel    Frequency of Communication with Friends and Family: More than three times a week    Frequency of Social Gatherings with Friends and Family: Twice a week    Attends Religious Services: More than 4 times per year    Active Member of [employer] or Organizations: Yes    Attends Banker Meetings: 1 to 4 times per year    Marital Status: Patient declined  Intimate Partner Violence: Not At Risk (09/01/2023)   Humiliation, Afraid, Rape, and Kick questionnaire    Fear of Current or Ex-Partner: No    Emotionally Abused: No    Physically Abused: No    Sexually Abused: No    Family History  Problem Relation Age of Onset   Hypertension Mother    Heart disease Mother    Lung cancer Mother    Hypertension Father  Heart attack Father    Colon cancer Neg Hx    Esophageal cancer Neg Hx    Pancreatic cancer Neg Hx    Prostate cancer Neg Hx    Rectal cancer Neg Hx    Stomach cancer Neg Hx    Breast cancer Neg Hx    Allergies  Allergen Reactions   Chlorhexidine  Rash   Codeine Itching   Ultram  [Tramadol ] Itching   I? Current Outpatient Medications  Medication Sig Dispense Refill   acetaminophen  (TYLENOL ) 500 MG tablet Take 1,000 mg by mouth every 6 (six) hours as needed for moderate pain (pain score 4-6).     ALPRAZolam  (XANAX ) 0.25 MG tablet Take 1 tablet (0.25 mg total) by mouth at bedtime as needed for anxiety. 30 tablet 0   Calcium  Carb-Cholecalciferol (CALCIUM  600+D) 600-10 MG-MCG TABS Take 1 tablet by mouth daily.     doxylamine , Sleep, (UNISOM ) 25 MG tablet Take 12.5 mg by mouth at bedtime.     hydrochlorothiazide  (HYDRODIURIL ) 12.5 MG tablet Take 1 tablet (12.5 mg total) by mouth every morning. 90 tablet 1   lidocaine -prilocaine  (EMLA ) cream Apply 1 Application topically as needed (Apply to port 1-2 hours prior to its use). 30 g 0   losartan  (COZAAR ) 25 MG  tablet Take 1 tablet (25 mg total) by mouth daily. 100 tablet 3   Multiple Vitamins-Minerals (MULTIVITAMINS THER. W/MINERALS) TABS Take 1 tablet by mouth daily.     potassium chloride  (MICRO-K ) 10 MEQ CR capsule Take 1 capsule (10 mEq total) by mouth 2 (two) times daily. 60 capsule 2   rosuvastatin  (CRESTOR ) 5 MG tablet Take 1 tablet (5 mg total) by mouth daily. 90 tablet 3   No current facility-administered medications for this visit.     Abtx:  Anti-infectives (From admission, onward)    None       REVIEW OF SYSTEMS:  Const: negative fever, negative chills, negative weight loss Eyes: negative diplopia or visual changes, negative eye pain ENT: negative coryza, negative sore throat Resp: negative cough, hemoptysis, dyspnea Cards: negative for chest pain, palpitations, lower extremity edema GU: negative for frequency, dysuria and hematuria GI: Negative for abdominal pain, diarrhea, bleeding, constipation Skin: negative for rash and pruritus Heme: negative for easy bruising and gum/nose bleeding MS: negative for myalgias, arthralgias, back pain and muscle weakness Neurolo:negative for headaches, dizziness, vertigo, memory problems  Psych: negative for feelings of anxiety, depression  Endocrine: negative for thyroid, diabetes Allergy/Immunology- negative for any medication or food allergies ? Pertinent Positives include : Objective:  VITALS:  Ht 5' 7 (1.702 m)   Wt 122 lb (55.3 kg)   LMP 02/02/2011   BMI 19.11 kg/m  LDA Foley Central line Other drainage tubes PHYSICAL EXAM:  General: Alert, cooperative, no distress, appears stated age.  Head: Normocephalic, without obvious abnormality, atraumatic. Eyes: Conjunctivae clear, anicteric sclerae. Pupils are equal ENT Nares normal. No drainage or sinus tenderness. Lips, mucosa, and tongue normal. No Thrush Neck: Supple, symmetrical, no adenopathy, thyroid: non tender no carotid bruit and no JVD. Back: No CVA  tenderness. Lungs: Clear to auscultation bilaterally. No Wheezing or Rhonchi. No rales. Heart: Regular rate and rhythm, no murmur, rub or gallop. Abdomen: Soft, non-tender,not distended. Bowel sounds normal. No masses Extremities: atraumatic, no cyanosis. No edema. No clubbing Skin: No rashes or lesions. Or bruising Lymph: Cervical, supraclavicular normal. Neurologic: Grossly non-focal Pertinent Labs Lab Results CBC    Component Value Date/Time   WBC 3.5 (L) 09/22/2023 1021   WBC 7.7 09/08/2023  1051   RBC 3.78 (L) 09/22/2023 1021   HGB 11.6 (L) 09/22/2023 1021   HCT 35.3 (L) 09/22/2023 1021   PLT 428 (H) 09/22/2023 1021   MCV 93.4 09/22/2023 1021   MCH 30.7 09/22/2023 1021   MCHC 32.9 09/22/2023 1021   RDW 14.3 09/22/2023 1021   LYMPHSABS 1.3 09/22/2023 1021   MONOABS 0.3 09/22/2023 1021   EOSABS 0.3 09/22/2023 1021   BASOSABS 0.1 09/22/2023 1021       Latest Ref Rng & Units 09/22/2023   10:21 AM 09/08/2023   10:51 AM 09/06/2023    4:20 AM  CMP  Glucose 70 - 99 mg/dL 893  887  86   BUN 8 - 23 mg/dL 5  6  6    Creatinine 0.44 - 1.00 mg/dL 9.36  9.50  9.47   Sodium 135 - 145 mmol/L 140  137  137   Potassium 3.5 - 5.1 mmol/L 3.3  3.7  3.3   Chloride 98 - 111 mmol/L 103  101  104   CO2 22 - 32 mmol/L 24  25  21    Calcium  8.9 - 10.3 mg/dL 9.6  8.9  8.3   Total Protein 6.5 - 8.1 g/dL 6.6     Total Bilirubin 0.0 - 1.2 mg/dL 0.4     Alkaline Phos 38 - 126 U/L 118     AST 15 - 41 U/L 26     ALT 0 - 44 U/L 23      ? Impression/Recommendation ?Clostridioides difficile infection   Ongoing infection as not adequately treated  The  initial Rx was Flagyl  . Current vancomycin  regimen has improved symptoms, but she continues. PCR test shows a positive toxigenic strain, though toxin is negative, indicating a possible hypervirulent strain. Differential includes colonization or other diarrhea causes. Adjust treatment due to upcoming chemotherapy. Restart vancomycin  125 mg every 6 hours  for 7 days , then taper to 3 times a day for 1 week and will adjust after that depending on her symptoms. Monitor stool frequency and adjust dosage based on symptoms.  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 Pt is  to have chemotherapy soon  So will give a long course of vanco 4-6 weeks dose adjusted to her symptoms To keep in mind that there could be other causes of diarrhea and if suboptimal response to vanco will have to repeat Cdiff test and pursue other causes_   _______________________________________________ Discussed with patient,in great detail Will follow her by mychart or if needed in person

## 2023-10-14 ENCOUNTER — Encounter: Payer: Self-pay | Admitting: Oncology

## 2023-10-14 ENCOUNTER — Encounter (HOSPITAL_COMMUNITY): Payer: Self-pay | Admitting: Oncology

## 2023-10-14 ENCOUNTER — Other Ambulatory Visit (HOSPITAL_BASED_OUTPATIENT_CLINIC_OR_DEPARTMENT_OTHER): Payer: Self-pay

## 2023-10-17 ENCOUNTER — Other Ambulatory Visit: Payer: Self-pay | Admitting: Oncology

## 2023-10-18 NOTE — Progress Notes (Unsigned)
 [facility]   [phone] [phone]    Patient Care Team: Vernon Velna SAUNDERS, MD as PCP - General (Internal Medicine) Claudene Victory ORN, MD (Inactive) as PCP - Cardiology (Cardiology) Nori Sari SQUIBB, RN as Oncology Nurse Navigator [name], Darice SAUNDERS, RN as Oncology Nurse Navigator (Oncology)   CHIEF COMPLAINT: Follow up carcinoma of the fallopian tube   CURRENT THERAPY: S/p 4 cycles neoadjuvant carbo/taxol , followed by debulking surgery, now pending adjuvant carbo/taxol   q3 weeks x4 cycles  INTERVAL HISTORY Debra Nguyen returns for follow up and treatment as scheduled. Treatment postponed for C.dif colitis, saw ID last week and currently on slow Vanc taper 4 times daily for the past week, will drop to 3 times a day for a week starting tomorrow.  Stools have improved overall, but still having 5-7 small-volume loose/soft stools daily in the evening.  No cramping abdominal pain or watery diarrhea.  Tolerating Vanc. Energy and appetite are in good shape.   ROS  All other systems reviewed and negative  Past Medical History:  Diagnosis Date   Allergy    Anxiety    Through divorce   Arthritis    ASCUS with positive high risk HPV 12/2006   Negative Colpo BX   ASCUS with positive high risk HPV 2009   Chronic kidney disease    kidney stones   CIN III (cervical intraepithelial neoplasia III) 07/1983   tx'd w/cryo- no subsequent abnormal paps until 2008   Essential hypertension    Family history of colon cancer    Fibroid 2006   3 cm Right Fundal Fibroid   Genetic testing 07/15/2023   Negative genetic testing on the CancerNext-Expanded+RNAinsight panel.  The report date is Jul 14, 2023.  The CancerNext-Expanded gene panel offered by [facility] and includes sequencing, rearrangement, and RNA analysis for the following 77 genes: AIP, ALK, APC, ATM, BAP1, BARD1, BMPR1A, BRCA1, BRCA2, BRIP1, CDC73, CDH1, CDK4, CDKN1B, CDKN2A, CEBPA, CHEK2, CTNNA1, DDX41,  DICER1, ETV6, FH, FL*   GERD (gastroesophageal reflux disease) 2011   History of kidney stones    Hypertension    Osteopenia 2020   Synovial cyst of lumbar spine 10/2014   Vitamin D  deficiency      Past Surgical History:  Procedure Laterality Date   ABDOMINAL HYSTERECTOMY  02/23/2011   R TLH-fibroids, adenomyosis 221 g, & focal CIN I w/clear margins   ANTERIOR CERVICAL DECOMP/DISCECTOMY FUSION N/A 07/19/2012   Procedure: ANTERIOR CERVICAL DECOMPRESSION/DISCECTOMY FUSION 2 LEVELS;  Surgeon: Fairy Levels, MD;  Location: [facility];  Service: Neurosurgery;  Laterality: N/A;  Anterior Cervical Five-Six Cervical Six-Seven Anterior Cervical Decompression/Diskectomy/Fusion   cataracts Bilateral 03/2022   COLONOSCOPY     CYSTOSCOPY  02/23/2011   PT.STATES SHE DID NOT HAVE   dental implants     IR IMAGING GUIDED PORT INSERTION  05/31/2023   LAPAROSCOPY N/A 09/01/2023   Procedure: LAPAROSCOPY, DIAGNOSTIC;  Surgeon: Viktoria Comer SAUNDERS, MD;  Location: [facility];  Service: Gynecology;  Laterality: N/A;  DIAGNOSTIC LAPAROSCOPY, OPEN BSO, DEBULKING INCLUDING BOWEL SURGERY   LASIK  '99   LUMBAR LAMINECTOMY/DECOMPRESSION MICRODISCECTOMY Right 02/05/2015   Procedure: Right Lumbar four-five Laminectomy with Resection of Synovial Cyst ;  Surgeon: Fairy Levels, MD;  Location: [facility];  Service: Neurosurgery;  Laterality: Right;   MOHS SURGERY     x 2   MOLE REMOVAL  2023   REPLACEMENT TOTAL KNEE Right 09/2021   SALPINGOOPHORECTOMY N/A 09/01/2023   Procedure: BILATERAL  SALPINGO-OOPHORECTOMY, OPEN, EX LAP WITH RADICAL TUMOR DEBULKING, OMENTECTOMY, RECTOSIGMOID RESECTION AND ANASTOMOSIS;  Surgeon: Viktoria Comer SAUNDERS, MD;  Location: [facility];  Service: Gynecology;  Laterality: N/A;   TONSILLECTOMY AND ADENOIDECTOMY     VAGINAL DELIVERY  '92 '94     Outpatient Encounter Medications as of 10/20/2023  Medication Sig   ALPRAZolam  (XANAX ) 0.25 MG tablet Take 1 tablet (0.25 mg total) by mouth at bedtime as  needed for anxiety.   doxylamine , Sleep, (UNISOM ) 25 MG tablet Take 12.5 mg by mouth at bedtime.   hydrochlorothiazide  (HYDRODIURIL ) 12.5 MG tablet Take 1 tablet (12.5 mg total) by mouth every morning.   lidocaine -prilocaine  (EMLA ) cream Apply 1 Application topically as needed (Apply to port 1-2 hours prior to its use).   losartan  (COZAAR ) 25 MG tablet Take 1 tablet (25 mg total) by mouth daily.   potassium chloride  (MICRO-K ) 10 MEQ CR capsule Take 1 capsule (10 mEq total) by mouth 2 (two) times daily.   rosuvastatin  (CRESTOR ) 5 MG tablet Take 1 tablet (5 mg total) by mouth daily.   vancomycin  (VANCOCIN ) 125 MG capsule Take 1 capsule (125 mg total) by mouth 4 (four) times daily.   [DISCONTINUED] acetaminophen  (TYLENOL ) 500 MG tablet Take 1,000 mg by mouth every 6 (six) hours as needed for moderate pain (pain score 4-6).   [DISCONTINUED] Calcium  Carb-Cholecalciferol (CALCIUM  600+D) 600-10 MG-MCG TABS Take 1 tablet by mouth daily.   [DISCONTINUED] Multiple Vitamins-Minerals (MULTIVITAMINS THER. W/MINERALS) TABS Take 1 tablet by mouth daily.   No facility-administered encounter medications on file as of 10/20/2023.     Today's Vitals   10/20/23 0800 10/20/23 0808  BP:  134/75  Pulse:  74  Resp:  18  Temp:  98.2 F (36.8 C)  TempSrc:  Temporal  SpO2:  97%  Weight:  123 lb 12.8 oz (56.2 kg)  Height:  5' 7 (1.702 m)  PainSc: 0-No pain    Body mass index is 19.39 kg/m.   ECOG PERFORMANCE STATUS: 1 - Symptomatic but completely ambulatory  PHYSICAL EXAM GENERAL:alert, no distress and comfortable SKIN: no rash  EYES: sclera clear NECK: without mass LYMPH:  no palpable cervical or supraclavicular lymphadenopathy  LUNGS: clear with normal breathing effort HEART: regular rate & rhythm, no lower extremity edema ABDOMEN: abdomen soft, non-tender and normal bowel sounds. Midline incision completely healed  NEURO: alert & oriented x 3 with fluent speech, no focal motor/sensory deficits PAC  without erythema    CBC    Latest Ref Rng & Units 10/20/2023    7:44 AM 09/22/2023   10:21 AM 09/08/2023   10:51 AM  CBC  WBC 4.0 - 10.5 K/uL 4.0  3.5  7.7   Hemoglobin 12.0 - 15.0 g/dL 88.3  88.3  89.0   Hematocrit 36.0 - 46.0 % 35.1  35.3  34.0   Platelets 150 - 400 K/uL 254  428  403       CMP     Latest Ref Rng & Units 10/20/2023    7:44 AM 09/22/2023   10:21 AM 09/08/2023   10:51 AM  CMP  Glucose 70 - 99 mg/dL 898  893  887   BUN 8 - 23 mg/dL 14  <5  6   Creatinine 0.44 - 1.00 mg/dL 9.32  9.36  9.50   Sodium 135 - 145 mmol/L 139  140  137   Potassium 3.5 - 5.1 mmol/L 3.7  3.3  3.7   Chloride 98 - 111 mmol/L 102  103  101   CO2 22 - 32 mmol/L 25  24  25    Calcium  8.9 - 10.3 mg/dL 9.7  9.6  8.9   Total Protein 6.5 - 8.1 g/dL 6.7  6.6    Total Bilirubin 0.0 - 1.2 mg/dL 0.6  0.4    Alkaline Phos 38 - 126 U/L 130  118    AST 15 - 41 U/L 56  26    ALT 0 - 44 U/L 56  23        ASSESSMENT & PLAN:  Fallopian tube carcinoma, right fallopian tube CT abdomen/pelvis 05/13/2023-5.0 cm apple core lesion in the upper/mid rectum.  Peritoneal disease/omental caking with multiple cystic metastases noted as well as abdominopelvic lymphadenopathy.  Colonoscopy 05/25/2023-infiltrative partially obstructing large mass in the rectosigmoid colon from 20 to 28 cm from the anal verge.  The mass was circumferential.  Oozing was present.  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  Monia 3 cycles of chemotherapy then repeat imaging, discuss if she is a candidate for debulking surgery Cycle 2 Taxol /carboplatin  06/23/2023, Udenyca  Cycle 3 Taxol /carboplatin  07/15/2023, Udenyca  CTs 08/02/2023-moderate to marked response to therapy of lower cervical, thoracic, abdominal and pelvic nodal metastasis.  Moderate response of abdominopelvic omental/peritoneal metastasis.  Similar to minimal decrease in size of a dominant left pelvic mass.  New mild left-sided hydroureteronephrosis likely secondary to the dominant mass. Cycle 4 Taxol /carboplatin  08/04/2023, Udenyca  09/01/2023-diagnostic laparoscopy, BSO, omentectomy, rectosigmoid resection, pelvic sidewall biopsy, bladder peritoneum biopsy, sigmoid nodule, small bowel mesenteric nodule resection.  High-grade carcinoma with psammoma calcifications and fibrosis involving the omentum, right fallopian tube and right ovary, left ovary, rectosigmoid colon, 4/20 lymph nodes positive, fibrosis with psammoma calcifications at the pelvic sidewall and bladder peritoneum biopsies, high-grade carcinoma involving the sigmoid colon nodule, fibrotic nodule with numerous psammoma calcifications at the small bowel mesentery nodule; high-grade serous carcinoma, FIGO grade 3, chemotherapy response score 2,[id number] Foundation One: MSI stable, HRDsig negative TMB 4 Muts/Mb, BRCA1 rearrangement exon 10 (Germline testing BRCA negative 06/18/23) Cycle 5 Taxol /Carboplatin  10/20/2023, Udenyca   Hypertension 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 difficile colitis 09/16/2023; vanc taper starting 10/13/23    Disposition:  Debra Nguyen appears well. Currently on slow Vanc taper per ID for C.dif which is improving. She feels better overall. Gaining weight, VSS, PS is very good.   We reviewed Foundation One which shows a BRCA1 mutation in the tumor. Germline testing shows BRCA1/2 negative. We discussed this suggest she may respond  better to carboplatin . We also reviewed the potential benefit of a PARP inhibitor in the future.   Labs reviewed, adequate for treatment. The etiology of the mild transaminitis is unclear. We will monitor.   She will proceed with cycle 1 adjuvant (total cycle 5) Carbo/taxol  today as scheduled. The doses have been adjusted for her current weight.   Return day 3 for Udenyca . Will schedule phone f/up next week for toxicity check, then f/up and cycle 2 (total cycle 6) in 3 weeks.   Patient seen with [name] and the case was discussed with [name] who agrees with restarting chemo.  All questions were answered. The patient knows to call the clinic with any problems, questions or concerns. No barriers to learning were detected.  Rodgerick Gilliand K Micheil Klaus, NP 10/20/2023  This was a shared visit with Debra Nguyen.  Debra Nguyen continues a vancomycin  taper for treatment of C. difficile colitis.  She reports her clinical status and diarrhea are improved.  She appears stable to begin adjuvant chemotherapy today.  We discussed the BRCA 1 alteration found in the resected tumor.  She may be a candidate for a PARP inhibitor after completion of adjuvant chemotherapy.  I was present for greater than 50% of today's visit.  I performed medical decision making.  Arvella Hof, MD

## 2023-10-20 ENCOUNTER — Inpatient Hospital Stay: Attending: Nurse Practitioner

## 2023-10-20 ENCOUNTER — Inpatient Hospital Stay

## 2023-10-20 ENCOUNTER — Encounter: Payer: Self-pay | Admitting: Nurse Practitioner

## 2023-10-20 ENCOUNTER — Inpatient Hospital Stay (HOSPITAL_BASED_OUTPATIENT_CLINIC_OR_DEPARTMENT_OTHER): Admitting: Nurse Practitioner

## 2023-10-20 VITALS — BP 134/75 | HR 74 | Temp 98.2°F | Resp 18 | Ht 67.0 in | Wt 123.8 lb

## 2023-10-20 VITALS — BP 122/74 | HR 65 | Temp 97.0°F | Resp 18

## 2023-10-20 DIAGNOSIS — C5701 Malignant neoplasm of right fallopian tube: Secondary | ICD-10-CM | POA: Insufficient documentation

## 2023-10-20 DIAGNOSIS — Z5111 Encounter for antineoplastic chemotherapy: Secondary | ICD-10-CM | POA: Diagnosis not present

## 2023-10-20 DIAGNOSIS — C569 Malignant neoplasm of unspecified ovary: Secondary | ICD-10-CM | POA: Diagnosis not present

## 2023-10-20 DIAGNOSIS — Z79899 Other long term (current) drug therapy: Secondary | ICD-10-CM | POA: Insufficient documentation

## 2023-10-20 DIAGNOSIS — C786 Secondary malignant neoplasm of retroperitoneum and peritoneum: Secondary | ICD-10-CM | POA: Diagnosis not present

## 2023-10-20 DIAGNOSIS — Z5189 Encounter for other specified aftercare: Secondary | ICD-10-CM | POA: Diagnosis not present

## 2023-10-20 DIAGNOSIS — C579 Malignant neoplasm of female genital organ, unspecified: Secondary | ICD-10-CM

## 2023-10-20 LAB — CBC WITH DIFFERENTIAL (CANCER CENTER ONLY)
Abs Immature Granulocytes: 0.01 K/uL (ref 0.00–0.07)
Basophils Absolute: 0 K/uL (ref 0.0–0.1)
Basophils Relative: 1 %
Eosinophils Absolute: 0.2 K/uL (ref 0.0–0.5)
Eosinophils Relative: 5 %
HCT: 35.1 % — ABNORMAL LOW (ref 36.0–46.0)
Hemoglobin: 11.6 g/dL — ABNORMAL LOW (ref 12.0–15.0)
Immature Granulocytes: 0 %
Lymphocytes Relative: 32 %
Lymphs Abs: 1.3 K/uL (ref 0.7–4.0)
MCH: 29.9 pg (ref 26.0–34.0)
MCHC: 33 g/dL (ref 30.0–36.0)
MCV: 90.5 fL (ref 80.0–100.0)
Monocytes Absolute: 0.3 K/uL (ref 0.1–1.0)
Monocytes Relative: 8 %
Neutro Abs: 2.2 K/uL (ref 1.7–7.7)
Neutrophils Relative %: 54 %
Platelet Count: 254 K/uL (ref 150–400)
RBC: 3.88 MIL/uL (ref 3.87–5.11)
RDW: 12.9 % (ref 11.5–15.5)
WBC Count: 4 K/uL (ref 4.0–10.5)
nRBC: 0 % (ref 0.0–0.2)

## 2023-10-20 LAB — CMP (CANCER CENTER ONLY)
ALT: 56 U/L — ABNORMAL HIGH (ref 0–44)
AST: 56 U/L — ABNORMAL HIGH (ref 15–41)
Albumin: 4.3 g/dL (ref 3.5–5.0)
Alkaline Phosphatase: 130 U/L — ABNORMAL HIGH (ref 38–126)
Anion gap: 13 (ref 5–15)
BUN: 14 mg/dL (ref 8–23)
CO2: 25 mmol/L (ref 22–32)
Calcium: 9.7 mg/dL (ref 8.9–10.3)
Chloride: 102 mmol/L (ref 98–111)
Creatinine: 0.67 mg/dL (ref 0.44–1.00)
GFR, Estimated: >60 mL/min (ref >60)
Glucose, Bld: 101 mg/dL — ABNORMAL HIGH (ref 70–99)
Potassium: 3.7 mmol/L (ref 3.5–5.1)
Sodium: 139 mmol/L (ref 135–145)
Total Bilirubin: 0.6 mg/dL (ref 0.0–1.2)
Total Protein: 6.7 g/dL (ref 6.5–8.1)

## 2023-10-20 LAB — MAGNESIUM: Magnesium: 2 mg/dL (ref 1.7–2.4)

## 2023-10-20 MED ORDER — APREPITANT 130 MG/18ML IV EMUL
130.0000 mg | Freq: Once | INTRAVENOUS | Status: AC
  Administered 2023-10-20: 130 mg via INTRAVENOUS
  Filled 2023-10-20: qty 18

## 2023-10-20 MED ORDER — SODIUM CHLORIDE 0.9 % IV SOLN
353.5000 mg | Freq: Once | INTRAVENOUS | Status: AC
  Administered 2023-10-20: 350 mg via INTRAVENOUS
  Filled 2023-10-20: qty 35

## 2023-10-20 MED ORDER — SODIUM CHLORIDE 0.9 % IV SOLN: INTRAVENOUS | Status: DC

## 2023-10-20 MED ORDER — SODIUM CHLORIDE 0.9 % IV SOLN
175.0000 mg/m2 | Freq: Once | INTRAVENOUS | Status: AC
  Administered 2023-10-20: 282 mg via INTRAVENOUS
  Filled 2023-10-20: qty 47

## 2023-10-20 MED ORDER — DEXAMETHASONE SODIUM PHOSPHATE 10 MG/ML IJ SOLN
10.0000 mg | Freq: Once | INTRAMUSCULAR | Status: AC
  Administered 2023-10-20: 10 mg via INTRAVENOUS
  Filled 2023-10-20: qty 1

## 2023-10-20 MED ORDER — PALONOSETRON HCL INJECTION 0.25 MG/5ML
0.2500 mg | Freq: Once | INTRAVENOUS | Status: AC
  Administered 2023-10-20: 0.25 mg via INTRAVENOUS
  Filled 2023-10-20: qty 5

## 2023-10-20 MED ORDER — FAMOTIDINE IN NACL 20-0.9 MG/50ML-% IV SOLN
20.0000 mg | Freq: Once | INTRAVENOUS | Status: AC
  Administered 2023-10-20: 20 mg via INTRAVENOUS
  Filled 2023-10-20: qty 50

## 2023-10-20 MED ORDER — DIPHENHYDRAMINE HCL 50 MG/ML IJ SOLN
50.0000 mg | Freq: Once | INTRAMUSCULAR | Status: AC
  Administered 2023-10-20: 50 mg via INTRAVENOUS
  Filled 2023-10-20: qty 1

## 2023-10-20 NOTE — Progress Notes (Signed)
 Patient seen by Dr. [name] today  Vitals are within treatment parameters:Yes   Labs are within treatment parameters: Yes   Treatment plan has been signed: Yes Has not signed [place] yet, but proceed with premeds and Taxol  and MD will sign Carbo soon.  Per physician team, Patient is ready for treatment and there are NO modifications to the treatment plan.

## 2023-10-20 NOTE — Patient Instructions (Signed)

## 2023-10-20 NOTE — Patient Instructions (Signed)
 [facility] - A DEPT OF [facility]  Discharge Instructions: Thank you for choosing [facility] to provide your oncology and hematology care.   If you have a lab appointment with the Cancer Center, please go directly to the Cancer Center and check in at the registration area.   Wear comfortable clothing and clothing appropriate for easy access to any Portacath or PICC line.   We strive to give you quality time with your provider. You may need to reschedule your appointment if you arrive late (15 or more minutes).  Arriving late affects you and other patients whose appointments are after yours.  Also, if you miss three or more appointments without notifying the office, you may be dismissed from the clinic at the provider's discretion.      For prescription refill requests, have your pharmacy contact our office and allow 72 hours for refills to be completed.    Today you received the following chemotherapy and/or immunotherapy agents: paclitaxel , carboplatin       To help prevent nausea and vomiting after your treatment, we encourage you to take your nausea medication as directed.  BELOW ARE SYMPTOMS THAT SHOULD BE REPORTED IMMEDIATELY: *FEVER GREATER THAN 100.4 F (38 C) OR HIGHER *CHILLS OR SWEATING *NAUSEA AND VOMITING THAT IS NOT CONTROLLED WITH YOUR NAUSEA MEDICATION *UNUSUAL SHORTNESS OF BREATH *UNUSUAL BRUISING OR BLEEDING *URINARY PROBLEMS (pain or burning when urinating, or frequent urination) *BOWEL PROBLEMS (unusual diarrhea, constipation, pain near the anus) TENDERNESS IN MOUTH AND THROAT WITH OR WITHOUT PRESENCE OF ULCERS (sore throat, sores in mouth, or a toothache) UNUSUAL RASH, SWELLING OR PAIN  UNUSUAL VAGINAL DISCHARGE OR ITCHING   Items with * indicate a potential emergency and should be followed up as soon as possible or go to the Emergency Department if any problems should occur.  Please show the CHEMOTHERAPY ALERT CARD or  IMMUNOTHERAPY ALERT CARD at check-in to the Emergency Department and triage nurse.  Should you have questions after your visit or need to cancel or reschedule your appointment, please contact [facility] - A DEPT OF [facility]  Dept: [phone]  and follow the prompts.  Office hours are 8:00 a.m. to 4:30 p.m. Monday - Friday. Please note that voicemails left after 4:00 p.m. may not be returned until the following business day.  We are closed weekends and major holidays. You have access to a nurse at all times for urgent questions. Please call the main number to the clinic Dept: [phone] and follow the prompts.   For any non-urgent questions, you may also contact your provider using MyChart. We now offer e-Visits for anyone 24 and older to request care online for non-urgent symptoms. For details visit [email or web address].   Also download the MyChart app! Go to the app store, search MyChart, open the app, select [facility], and log in with your MyChart username and password.

## 2023-10-21 LAB — CA 125: Cancer Antigen (CA) 125: 7.8 U/mL (ref 0.0–38.1)

## 2023-10-22 ENCOUNTER — Inpatient Hospital Stay

## 2023-10-22 VITALS — BP 139/78 | HR 80 | Temp 98.2°F | Resp 18

## 2023-10-22 DIAGNOSIS — Z5111 Encounter for antineoplastic chemotherapy: Secondary | ICD-10-CM | POA: Diagnosis not present

## 2023-10-22 DIAGNOSIS — C579 Malignant neoplasm of female genital organ, unspecified: Secondary | ICD-10-CM

## 2023-10-22 MED ORDER — PEGFILGRASTIM-CBQV 6 MG/0.6ML ~~LOC~~ SOSY
6.0000 mg | PREFILLED_SYRINGE | Freq: Once | SUBCUTANEOUS | Status: AC
  Administered 2023-10-22: 6 mg via SUBCUTANEOUS
  Filled 2023-10-22: qty 0.6

## 2023-10-22 NOTE — Patient Instructions (Signed)

## 2023-10-25 ENCOUNTER — Other Ambulatory Visit (HOSPITAL_BASED_OUTPATIENT_CLINIC_OR_DEPARTMENT_OTHER): Payer: Self-pay

## 2023-10-25 ENCOUNTER — Telehealth: Payer: Self-pay

## 2023-10-26 ENCOUNTER — Ambulatory Visit: Admitting: Infectious Disease

## 2023-10-26 ENCOUNTER — Telehealth: Payer: Self-pay

## 2023-10-26 NOTE — Telephone Encounter (Signed)
-----   Message from [place] P sent at 10/25/2023  1:41 PM EDT ----- Regarding: Doesn't see need for upcoming visit Hello ladies,   This is one of Debra Nguyen's pt's. She called and said she thought her appt was a phone visit and that she feels better and doesn't see the need for her upcoming appt unless Debra Nguyen deems it necessary. Thanks,   [name]

## 2023-10-26 NOTE — Telephone Encounter (Signed)
 The patient reports feeling better and wishes to cancel her appointment.

## 2023-10-27 ENCOUNTER — Inpatient Hospital Stay: Admitting: Nurse Practitioner

## 2023-10-27 ENCOUNTER — Encounter: Payer: Self-pay | Admitting: Infectious Diseases

## 2023-10-27 ENCOUNTER — Ambulatory Visit: Admitting: Nurse Practitioner

## 2023-10-27 ENCOUNTER — Other Ambulatory Visit

## 2023-10-27 ENCOUNTER — Ambulatory Visit

## 2023-10-27 DIAGNOSIS — A0472 Enterocolitis due to Clostridium difficile, not specified as recurrent: Secondary | ICD-10-CM

## 2023-10-27 DIAGNOSIS — A09 Infectious gastroenteritis and colitis, unspecified: Secondary | ICD-10-CM

## 2023-10-28 ENCOUNTER — Other Ambulatory Visit: Payer: Self-pay | Admitting: Infectious Diseases

## 2023-10-28 ENCOUNTER — Other Ambulatory Visit: Payer: Self-pay

## 2023-10-28 ENCOUNTER — Other Ambulatory Visit

## 2023-10-28 ENCOUNTER — Other Ambulatory Visit (HOSPITAL_BASED_OUTPATIENT_CLINIC_OR_DEPARTMENT_OTHER): Payer: Self-pay

## 2023-10-28 DIAGNOSIS — A09 Infectious gastroenteritis and colitis, unspecified: Secondary | ICD-10-CM

## 2023-10-28 MED ORDER — VANCOMYCIN HCL 125 MG PO CAPS
125.0000 mg | ORAL_CAPSULE | Freq: Four times a day (QID) | ORAL | 0 refills | Status: DC
  Filled 2023-10-28: qty 61, 16d supply, fill #0

## 2023-10-28 MED ORDER — FIDAXOMICIN 200 MG PO TABS
200.0000 mg | ORAL_TABLET | Freq: Two times a day (BID) | ORAL | 0 refills | Status: DC
  Filled 2023-10-28: qty 20, 10d supply, fill #0

## 2023-10-28 NOTE — Progress Notes (Signed)
 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  She took  5  tablets of keflex  for a surgical site skin infection  and subsequently developed diarrhea and was diagnosed a C. diff infection on 09/16/23. Antigen positive, toxin neg but PCR was positive. She was given a 10 day course of flagyl  > her symptoms got a little better only to become worse. She then was prescribed a  ten-day course of vancomycin  125 mg every six hours, which she completed on 8/24- She still had 4 soft stools/day- was around 13 at the peak On 8/27 I gave her a vanco taper-she has now completed a week of Q6 vanco and a week of Q8 vanco. She  has been having erratic bowel movts- 0-8/day She also has urgency and then has some loose stool  I asked her to repeat cdiff test- Toxin.antigen and reflex to PCR- she gave a sample this afternoon  If it is still positive may  do Fidoxamicin -  [facility] If the stool is negative then we will have to explore other causes including post infectious diarrhea or the primary condition of rectosigmoid resection /cancer related issue

## 2023-10-28 NOTE — Telephone Encounter (Signed)
 Patient dropped off stool sample today. Patient placed on lab schedule.  Enis Kleine, LPN

## 2023-10-28 NOTE — Progress Notes (Signed)
 Fidoxamicin copay is > 1000$ Sent prescription for vancomycin  125 mg X 90 capsules

## 2023-10-28 NOTE — Telephone Encounter (Signed)
 Stool kit placed at front desk for patient to pick up.   Dinna Severs, BSN, RN

## 2023-10-29 ENCOUNTER — Other Ambulatory Visit (HOSPITAL_BASED_OUTPATIENT_CLINIC_OR_DEPARTMENT_OTHER): Payer: Self-pay

## 2023-10-29 ENCOUNTER — Telehealth: Payer: Self-pay | Admitting: Infectious Diseases

## 2023-10-29 ENCOUNTER — Ambulatory Visit

## 2023-10-29 LAB — CLOSTRIDIUM DIFFICILE TOXIN B, QUALITATIVE, REAL-TIME PCR: Toxigenic C. Difficile by PCR: NOT DETECTED

## 2023-10-29 NOTE — Telephone Encounter (Signed)
 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  She took  5  tablets of keflex  for a surgical site skin infection  and subsequently developed diarrhea and was diagnosed a C. diff infection on 09/16/23. Antigen positive, toxin neg but PCR was positive. She was given a 10 day course of flagyl  > her symptoms got a little better only to become worse. She then was prescribed a  ten-day course of vancomycin  125 mg every six hours, which she completed on 8/24- She still had 4 soft stools/day- was around 13 at the peak On 8/27 I gave her a vanco taper-she has now completed a week of Q6 vanco and a week of Q8 vanco. She  has been having erratic bowel movts- 0-8/day She also has urgency and then has some loose stool  Cdiff test PCR done 10/28/23 was negative. Positive test was on 09/16/23 Pt has been on Vancomycin  since 8/27 and before that took 10 days of vanco which she completed on 8/24. She has erratic bowel  movt - has now 0-6 soft stools- it is not watery like before when she had cdiff So I asked her to stop vanco ( she does not have more than a day supply left) If the urgency and frequent stools continue she will have to see her /onc surgeon to see whether the rectosigmoid resection for the uterine malignancy has any role in causation or if this is post infectious diarrhea may need referral to GI She will communicate by my chart early next week to note how she did off vanco

## 2023-11-02 ENCOUNTER — Ambulatory Visit
Admission: RE | Admit: 2023-11-02 | Discharge: 2023-11-02 | Disposition: A | Discharge status: Home / Self Care | Source: Ambulatory Visit | Attending: Internal Medicine | Admitting: Internal Medicine

## 2023-11-02 DIAGNOSIS — Z1231 Encounter for screening mammogram for malignant neoplasm of breast: Secondary | ICD-10-CM | POA: Diagnosis not present

## 2023-11-04 ENCOUNTER — Encounter: Payer: Self-pay | Admitting: Infectious Diseases

## 2023-11-04 ENCOUNTER — Telehealth: Payer: Self-pay | Admitting: *Deleted

## 2023-11-04 NOTE — Telephone Encounter (Signed)
 Spoke with Ms. Graffam and relayed message from [name] that it can take 6-12 months for bowel function to normalize after a rectosigmoid resection and reanastomosis. [name] is very hopeful this will improve with time and she is glad that things are overall better than they were. Patient is very grateful for this message and wants to say,  Tell [name] thank you very much, this gives me hope

## 2023-11-04 NOTE — Telephone Encounter (Signed)
 Ami, would you please call her to see how bowel movements are? Thank you!

## 2023-11-04 NOTE — Telephone Encounter (Signed)
 It can take 6-12 months for bowel function to normalize after a rectosigmoid resection and reanastomosis. Please let her know, I am very hopeful this will improve with time. I'm so glad that things are overall better than they were.

## 2023-11-04 NOTE — Telephone Encounter (Addendum)
 Spoke with Debra Nguyen who states that Dr. Fayette from Infectious disease will be sending a summary to [name] and Dr. Deanne.   Pt states she went back on Vancomycin  and finished on September 11th. Her repeat PCR test came back on September 12 th. And was negative for C.Diff.   She continues to follow with Dr. Fayette. And continues to have soft stools 3-5 times a day, mainly in the evening only. (This is an improvement from the 8 stools a day)  Without blood, mucous and no cramping. Pt states, not optimal but ok.    Pt is questioning wether this is her new normal or since she is 9 weeks post op, could more time be needed for her bowels to heal from her resection?   Patient reports feeling good and started back up on her Chemotherapy on 9/3. She has three more treatments on 9/24, 10/15 and 11/5.   Patient thanked the office for calling and a follow up appointment has been scheduled with [name] for January 9th at 4 pm.

## 2023-11-07 ENCOUNTER — Other Ambulatory Visit: Payer: Self-pay | Admitting: Oncology

## 2023-11-10 ENCOUNTER — Other Ambulatory Visit (HOSPITAL_BASED_OUTPATIENT_CLINIC_OR_DEPARTMENT_OTHER): Payer: Self-pay

## 2023-11-10 ENCOUNTER — Inpatient Hospital Stay (HOSPITAL_BASED_OUTPATIENT_CLINIC_OR_DEPARTMENT_OTHER): Admitting: Oncology

## 2023-11-10 ENCOUNTER — Other Ambulatory Visit: Payer: Self-pay

## 2023-11-10 ENCOUNTER — Inpatient Hospital Stay

## 2023-11-10 VITALS — BP 116/62 | HR 71 | Temp 98.3°F | Resp 18

## 2023-11-10 VITALS — BP 122/82 | HR 77 | Temp 98.1°F | Resp 18 | Ht 67.0 in | Wt 125.4 lb

## 2023-11-10 DIAGNOSIS — C579 Malignant neoplasm of female genital organ, unspecified: Secondary | ICD-10-CM

## 2023-11-10 DIAGNOSIS — Z5111 Encounter for antineoplastic chemotherapy: Secondary | ICD-10-CM | POA: Diagnosis not present

## 2023-11-10 LAB — CBC WITH DIFFERENTIAL (CANCER CENTER ONLY)
Abs Immature Granulocytes: 0.04 K/uL (ref 0.00–0.07)
Basophils Absolute: 0.1 K/uL (ref 0.0–0.1)
Basophils Relative: 1 %
Eosinophils Absolute: 0.1 K/uL (ref 0.0–0.5)
Eosinophils Relative: 1 %
HCT: 37.4 % (ref 36.0–46.0)
Hemoglobin: 12 g/dL (ref 12.0–15.0)
Immature Granulocytes: 0 %
Lymphocytes Relative: 11 %
Lymphs Abs: 1.2 K/uL (ref 0.7–4.0)
MCH: 30 pg (ref 26.0–34.0)
MCHC: 32.1 g/dL (ref 30.0–36.0)
MCV: 93.5 fL (ref 80.0–100.0)
Monocytes Absolute: 0.6 K/uL (ref 0.1–1.0)
Monocytes Relative: 6 %
Neutro Abs: 9 K/uL — ABNORMAL HIGH (ref 1.7–7.7)
Neutrophils Relative %: 81 %
Platelet Count: 321 K/uL (ref 150–400)
RBC: 4 MIL/uL (ref 3.87–5.11)
RDW: 14.5 % (ref 11.5–15.5)
WBC Count: 11 K/uL — ABNORMAL HIGH (ref 4.0–10.5)
nRBC: 0 % (ref 0.0–0.2)

## 2023-11-10 LAB — CMP (CANCER CENTER ONLY)
ALT: 58 U/L — ABNORMAL HIGH (ref 0–44)
AST: 37 U/L (ref 15–41)
Albumin: 4.4 g/dL (ref 3.5–5.0)
Alkaline Phosphatase: 190 U/L — ABNORMAL HIGH (ref 38–126)
Anion gap: 13 (ref 5–15)
BUN: 11 mg/dL (ref 8–23)
CO2: 24 mmol/L (ref 22–32)
Calcium: 9.6 mg/dL (ref 8.9–10.3)
Chloride: 103 mmol/L (ref 98–111)
Creatinine: 0.52 mg/dL (ref 0.44–1.00)
GFR, Estimated: >60 mL/min (ref >60)
Glucose, Bld: 97 mg/dL (ref 70–99)
Potassium: 3.8 mmol/L (ref 3.5–5.1)
Sodium: 140 mmol/L (ref 135–145)
Total Bilirubin: 0.7 mg/dL (ref 0.0–1.2)
Total Protein: 6.7 g/dL (ref 6.5–8.1)

## 2023-11-10 MED ORDER — PALONOSETRON HCL INJECTION 0.25 MG/5ML
0.2500 mg | Freq: Once | INTRAVENOUS | Status: AC
  Administered 2023-11-10: 0.25 mg via INTRAVENOUS
  Filled 2023-11-10: qty 5

## 2023-11-10 MED ORDER — FAMOTIDINE IN NACL 20-0.9 MG/50ML-% IV SOLN
20.0000 mg | Freq: Once | INTRAVENOUS | Status: AC
  Administered 2023-11-10: 20 mg via INTRAVENOUS
  Filled 2023-11-10: qty 50

## 2023-11-10 MED ORDER — ALPRAZOLAM 0.25 MG PO TABS
0.2500 mg | ORAL_TABLET | Freq: Every evening | ORAL | 1 refills | Status: AC | PRN
Start: 2023-11-10 — End: ?
  Filled 2023-11-10: qty 30, 30d supply, fill #0

## 2023-11-10 MED ORDER — SODIUM CHLORIDE 0.9 % IV SOLN
353.5000 mg | Freq: Once | INTRAVENOUS | Status: AC
  Administered 2023-11-10: 350 mg via INTRAVENOUS
  Filled 2023-11-10: qty 35

## 2023-11-10 MED ORDER — SODIUM CHLORIDE 0.9 % IV SOLN: INTRAVENOUS | Status: DC

## 2023-11-10 MED ORDER — DEXAMETHASONE SODIUM PHOSPHATE 10 MG/ML IJ SOLN
10.0000 mg | Freq: Once | INTRAMUSCULAR | Status: AC
  Administered 2023-11-10: 10 mg via INTRAVENOUS
  Filled 2023-11-10: qty 1

## 2023-11-10 MED ORDER — DIPHENHYDRAMINE HCL 50 MG/ML IJ SOLN
50.0000 mg | Freq: Once | INTRAMUSCULAR | Status: AC
  Administered 2023-11-10: 50 mg via INTRAVENOUS
  Filled 2023-11-10: qty 1

## 2023-11-10 MED ORDER — SODIUM CHLORIDE 0.9 % IV SOLN
175.0000 mg/m2 | Freq: Once | INTRAVENOUS | Status: AC
  Administered 2023-11-10: 282 mg via INTRAVENOUS
  Filled 2023-11-10: qty 47

## 2023-11-10 MED ORDER — APREPITANT 130 MG/18ML IV EMUL
130.0000 mg | Freq: Once | INTRAVENOUS | Status: AC
  Administered 2023-11-10: 130 mg via INTRAVENOUS
  Filled 2023-11-10: qty 18

## 2023-11-10 NOTE — Patient Instructions (Signed)
 [facility] - A DEPT OF [facility]  Discharge Instructions: Thank you for choosing [facility] to provide your oncology and hematology care.   If you have a lab appointment with the Cancer Center, please go directly to the Cancer Center and check in at the registration area.   Wear comfortable clothing and clothing appropriate for easy access to any Portacath or PICC line.   We strive to give you quality time with your provider. You may need to reschedule your appointment if you arrive late (15 or more minutes).  Arriving late affects you and other patients whose appointments are after yours.  Also, if you miss three or more appointments without notifying the office, you may be dismissed from the clinic at the provider's discretion.      For prescription refill requests, have your pharmacy contact our office and allow 72 hours for refills to be completed.    Today you received the following chemotherapy and/or immunotherapy agents: paclitaxel , carboplatin       To help prevent nausea and vomiting after your treatment, we encourage you to take your nausea medication as directed.  BELOW ARE SYMPTOMS THAT SHOULD BE REPORTED IMMEDIATELY: *FEVER GREATER THAN 100.4 F (38 C) OR HIGHER *CHILLS OR SWEATING *NAUSEA AND VOMITING THAT IS NOT CONTROLLED WITH YOUR NAUSEA MEDICATION *UNUSUAL SHORTNESS OF BREATH *UNUSUAL BRUISING OR BLEEDING *URINARY PROBLEMS (pain or burning when urinating, or frequent urination) *BOWEL PROBLEMS (unusual diarrhea, constipation, pain near the anus) TENDERNESS IN MOUTH AND THROAT WITH OR WITHOUT PRESENCE OF ULCERS (sore throat, sores in mouth, or a toothache) UNUSUAL RASH, SWELLING OR PAIN  UNUSUAL VAGINAL DISCHARGE OR ITCHING   Items with * indicate a potential emergency and should be followed up as soon as possible or go to the Emergency Department if any problems should occur.  Please show the CHEMOTHERAPY ALERT CARD or  IMMUNOTHERAPY ALERT CARD at check-in to the Emergency Department and triage nurse.  Should you have questions after your visit or need to cancel or reschedule your appointment, please contact [facility] - A DEPT OF [facility]  Dept: [phone]  and follow the prompts.  Office hours are 8:00 a.m. to 4:30 p.m. Monday - Friday. Please note that voicemails left after 4:00 p.m. may not be returned until the following business day.  We are closed weekends and major holidays. You have access to a nurse at all times for urgent questions. Please call the main number to the clinic Dept: [phone] and follow the prompts.   For any non-urgent questions, you may also contact your provider using MyChart. We now offer e-Visits for anyone 24 and older to request care online for non-urgent symptoms. For details visit [email or web address].   Also download the MyChart app! Go to the app store, search MyChart, open the app, select [facility], and log in with your MyChart username and password.

## 2023-11-10 NOTE — Patient Instructions (Signed)

## 2023-11-10 NOTE — Progress Notes (Signed)
  Cancer Center OFFICE PROGRESS NOTE   Diagnosis: Fallopian tube cancer  INTERVAL HISTORY:   Debra Nguyen completed another cycle of Taxol /carboplatin  on 10/20/2023.  She tolerated the treatment well.  No nausea/vomiting or neuropathy symptoms.  Diarrhea has resolved.  She discontinued vancomycin  10/29/2023.  A C. difficile PCR was negative on 10/28/2023.  She had bone pain for 2 days after receiving G-CSF.  She feels well.  Good appetite.  Objective:  Vital signs in last 24 hours:  Blood pressure 122/82, pulse 77, temperature 98.1 F (36.7 C), temperature source Temporal, resp. rate 18, height 5' 7 (1.702 m), weight 125 lb 6.4 oz (56.9 kg), last menstrual period 02/02/2011, SpO2 98%.    HEENT: No thrush or ulcers Resp: Lungs clear bilaterally Cardio: Regular rate and rhythm GI: No hepatosplenomegaly, no apparent ascites Vascular: No leg edema   Portacath/PICC-without erythema  Lab Results:  Lab Results  Component Value Date   WBC 4.0 10/20/2023   HGB 11.6 (L) 10/20/2023   HCT 35.1 (L) 10/20/2023   MCV 90.5 10/20/2023   PLT 254 10/20/2023   NEUTROABS 2.2 10/20/2023    CMP  Lab Results  Component Value Date   NA 139 10/20/2023   K 3.7 10/20/2023   CL 102 10/20/2023   CO2 25 10/20/2023   GLUCOSE 101 (H) 10/20/2023   BUN 14 10/20/2023   CREATININE 0.67 10/20/2023   CALCIUM  9.7 10/20/2023   PROT 6.7 10/20/2023   ALBUMIN  4.3 10/20/2023   AST 56 (H) 10/20/2023   ALT 56 (H) 10/20/2023   ALKPHOS 130 (H) 10/20/2023   BILITOT 0.6 10/20/2023   GFRNONAA >60 10/20/2023   GFRAA >60 02/01/2015    Lab Results  Component Value Date   CEA 5.86 (H) 05/27/2023    No results found for: INR, LABPROT  Imaging:  No results found.  Medications: I have reviewed the patient's current medications.   Assessment/Plan: Fallopian tube carcinoma, right fallopian tube CT abdomen/pelvis 05/13/2023-5.0 cm apple core lesion in the upper/mid rectum.  Peritoneal  disease/omental caking with multiple cystic metastases noted as well as abdominopelvic lymphadenopathy.  Colonoscopy 05/25/2023-infiltrative partially obstructing large mass in the rectosigmoid colon from 20 to 28 cm from the anal verge.  The mass was circumferential.  Oozing was present.  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 Monia 3 cycles of chemotherapy then repeat imaging, discuss if she is a candidate for debulking surgery Cycle 2 Taxol /carboplatin  06/23/2023, Udenyca  Cycle 3 Taxol /carboplatin  07/15/2023, Udenyca  CTs 08/02/2023-moderate to marked response to therapy of lower cervical, thoracic, abdominal and pelvic nodal metastasis.  Moderate response of abdominopelvic omental/peritoneal metastasis.  Similar to minimal decrease in size of a dominant left pelvic mass.  New mild left-sided hydroureteronephrosis likely secondary to the dominant mass. Cycle 4 Taxol /carboplatin  08/04/2023, Udenyca  09/01/2023-diagnostic laparoscopy, BSO, omentectomy, rectosigmoid resection, pelvic sidewall biopsy, bladder peritoneum biopsy, sigmoid nodule, small bowel mesenteric nodule resection.  High-grade carcinoma with psammoma calcifications and fibrosis involving the omentum, right fallopian tube and right ovary, left ovary, rectosigmoid colon, 4/20 lymph nodes positive, fibrosis with psammoma calcifications at the pelvic sidewall and bladder peritoneum biopsies, high-grade carcinoma  involving  the sigmoid colon nodule, fibrotic nodule with numerous psammoma calcifications at the small bowel mesentery nodule; high-grade serous carcinoma, FIGO grade 3, chemotherapy response score 2,[id number] Foundation One: MSI stable, HRDsig negative TMB 4 Muts/Mb, BRCA1 rearrangement exon 10 (Germline testing BRCA negative 06/18/23) Cycle 5 Taxol /Carboplatin  10/20/2023, Udenyca  Cycle 6 Taxol /carboplatin  11/10/2023, Udenyca   Hypertension 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 difficile colitis 09/16/2023; vanc taper starting 10/13/23, completed 10/29/2023      Disposition: Debra Nguyen appears stable.  She will complete cycle 6 Taxol /carboplatin  today.  I will contact Debra Nguyen to discuss the indication for completing 2 more cycles of Taxol /carboplatin  versus stopping after cycle 6.  She appears to be a candidate for maintenance therapy with a PARP inhibitor.  She will undergo restaging CTs after the completion of chemotherapy.  She developed bone pain after Udenyca .  The pain was relieved with tramadol .  She will use tramadol  as needed.  Debra Hof, MD  11/10/2023  10:29 AM

## 2023-11-10 NOTE — Progress Notes (Signed)
 Patient seen by Dr. [name] today  Vitals are within treatment parameters:Yes   Labs are within treatment parameters: Yes   Treatment plan has been signed: Yes   Per physician team, Patient is ready for treatment and there are NO modifications to the treatment plan.

## 2023-11-11 MED ORDER — PEGFILGRASTIM-CBQV 6 MG/0.6ML ~~LOC~~ SOSY: 6.0000 mg | PREFILLED_SYRINGE | Freq: Once | SUBCUTANEOUS | Status: DC

## 2023-11-12 ENCOUNTER — Inpatient Hospital Stay

## 2023-11-12 VITALS — BP 109/72 | HR 83 | Temp 98.1°F | Resp 18

## 2023-11-12 DIAGNOSIS — C579 Malignant neoplasm of female genital organ, unspecified: Secondary | ICD-10-CM

## 2023-11-12 DIAGNOSIS — Z5111 Encounter for antineoplastic chemotherapy: Secondary | ICD-10-CM | POA: Diagnosis not present

## 2023-11-12 MED ORDER — PEGFILGRASTIM-CBQV 6 MG/0.6ML ~~LOC~~ SOSY
6.0000 mg | PREFILLED_SYRINGE | Freq: Once | SUBCUTANEOUS | Status: AC
  Administered 2023-11-12: 6 mg via SUBCUTANEOUS
  Filled 2023-11-12: qty 0.6

## 2023-11-12 NOTE — Patient Instructions (Signed)

## 2023-11-16 ENCOUNTER — Encounter: Payer: Self-pay | Admitting: Oncology

## 2023-11-17 ENCOUNTER — Telehealth: Payer: Self-pay | Admitting: *Deleted

## 2023-11-17 NOTE — Telephone Encounter (Signed)
 Spoke with Debra Nguyen who called the office stating since Sunday she is having soft, formed stools but is having internal pain that is unpleasant when she is sitting down and painful irritation when she is passing stool. Pt denies constipation, not seeing blood or mucous in her stool. She reports having external hemorrhoids and is using sitz baths, preparation H, 1% hydrocortisone and witch hazel. Pt has not inserted anything in the rectum and she is asking if there is anything she can do to help with the pain since she is moving her bowels at times up to 5 times a day?  Advised Debra Nguyen that her message will be relayed to providers and the office will call back with recommendations. Pt thanked the office.

## 2023-11-17 NOTE — Telephone Encounter (Signed)
 Spoke with Debra Nguyen and relayed message from [name] that patient can gently try H suppositories and  continue using the external preparation as well to.  Also advised patient to remain still for a few minutes to allow the suppository to dissolve. And try to avoid a bowel movement for one to three hours after insertion. Pt verbalized understanding and thanked the office for calling back.

## 2023-11-19 ENCOUNTER — Other Ambulatory Visit: Payer: Self-pay | Admitting: *Deleted

## 2023-11-19 ENCOUNTER — Telehealth: Payer: Self-pay | Admitting: *Deleted

## 2023-11-19 DIAGNOSIS — C569 Malignant neoplasm of unspecified ovary: Secondary | ICD-10-CM

## 2023-11-19 NOTE — Telephone Encounter (Signed)
 Spoke with Debra Nguyen and patient states her symptoms have greatly improved and she has had 60-70% relief from the pain using the preparation H suppositories as well as the external cream. Pt continues to use sitz bath, peri bottle and witch hazel. Pt reports not using toilet paper, just the personal care peri bottle.   Pt is using the suppositories three times a day.  She is asking for how long she should use the suppositories? Advised patient ok for up to 7 days, and then she can taper off and then use them as needed but message will be relayed to providers and if new recommendations the office will call back. Pt verbalized understanding and thanked the office for calling back.

## 2023-11-19 NOTE — Telephone Encounter (Signed)
 Attempted to reach Ms. Gates. Left voicemail requesting call back.

## 2023-11-19 NOTE — Telephone Encounter (Signed)
 I generally tell me two weeks and then stop to see how symptoms are

## 2023-11-19 NOTE — Telephone Encounter (Signed)
 Do you mind calling her this afternoon and seeing if doing any better?

## 2023-11-22 ENCOUNTER — Encounter: Payer: Self-pay | Admitting: Oncology

## 2023-11-22 ENCOUNTER — Encounter: Payer: Self-pay | Admitting: *Deleted

## 2023-11-22 NOTE — Telephone Encounter (Signed)
 Spoke with Ms. Brass and relayed message from [name] that she generally tells her patient's they can use the suppositories for two weeks and then stop to see how symptoms are. Pt verbalized understanding, and states her symptoms continue to get better and thanked the office for calling.

## 2023-11-22 NOTE — Progress Notes (Signed)
 Able to fax referral, demographics and medical records to Dr. Renaye Sous at [phone] after multiple attempts on 10/3.

## 2023-11-24 ENCOUNTER — Other Ambulatory Visit: Payer: Self-pay | Admitting: *Deleted

## 2023-11-24 ENCOUNTER — Other Ambulatory Visit (HOSPITAL_BASED_OUTPATIENT_CLINIC_OR_DEPARTMENT_OTHER): Payer: Self-pay

## 2023-11-24 MED ORDER — ZOLPIDEM TARTRATE 10 MG PO TABS: 10.0000 mg | ORAL_TABLET | Freq: Every evening | ORAL | 1 refills | Status: DC | PRN

## 2023-11-24 MED ORDER — ZOLPIDEM TARTRATE 10 MG PO TABS
10.0000 mg | ORAL_TABLET | Freq: Every day | ORAL | 1 refills | Status: DC
  Filled 2023-11-24: qty 30, 30d supply, fill #0

## 2023-11-24 NOTE — Telephone Encounter (Signed)
 MyChart message from patient requesting refill on Ambien  10 mg. Refill approved.

## 2023-11-25 ENCOUNTER — Other Ambulatory Visit: Payer: Self-pay | Admitting: Oncology

## 2023-11-26 ENCOUNTER — Telehealth: Payer: Self-pay | Admitting: Pharmacy Technician

## 2023-11-26 ENCOUNTER — Other Ambulatory Visit (HOSPITAL_BASED_OUTPATIENT_CLINIC_OR_DEPARTMENT_OTHER): Payer: Self-pay

## 2023-11-26 ENCOUNTER — Other Ambulatory Visit: Payer: Self-pay

## 2023-11-26 ENCOUNTER — Other Ambulatory Visit (HOSPITAL_COMMUNITY): Payer: Self-pay

## 2023-11-26 NOTE — Telephone Encounter (Addendum)
 Oral Oncology Patient Advocate Encounter  Was successful in securing patient a $3500 grant from [employer] to provide copayment coverage for [employer].  This will keep the out of pocket expense at $0.     [employer] ID: [id number]   The billing information is as follows and has been shared with [facility].    RxBin: [id number] PCN: [id number] Member ID: [id number] Group ID: [id number] Dates of Eligibility: 10/27/2023 through 10/25/2024  Fund:  Ovarian Cancer - Medicare Access  [place] ([name]) Chet Burnet, CPhT  [facility], Zelda Salmon, [facility] - Oral Chemotherapy Patient Advocate Specialist III Phone: [phone]  Fax: [phone]

## 2023-12-01 ENCOUNTER — Inpatient Hospital Stay: Admitting: Oncology

## 2023-12-01 ENCOUNTER — Inpatient Hospital Stay: Attending: Nurse Practitioner

## 2023-12-01 ENCOUNTER — Inpatient Hospital Stay

## 2023-12-01 VITALS — BP 125/76 | HR 83 | Temp 97.6°F | Resp 18

## 2023-12-01 VITALS — BP 133/76 | HR 85 | Temp 97.7°F | Resp 18 | Ht 67.0 in | Wt 126.9 lb

## 2023-12-01 DIAGNOSIS — Z5111 Encounter for antineoplastic chemotherapy: Secondary | ICD-10-CM | POA: Diagnosis not present

## 2023-12-01 DIAGNOSIS — C786 Secondary malignant neoplasm of retroperitoneum and peritoneum: Secondary | ICD-10-CM | POA: Insufficient documentation

## 2023-12-01 DIAGNOSIS — C579 Malignant neoplasm of female genital organ, unspecified: Secondary | ICD-10-CM

## 2023-12-01 DIAGNOSIS — C5701 Malignant neoplasm of right fallopian tube: Secondary | ICD-10-CM | POA: Insufficient documentation

## 2023-12-01 DIAGNOSIS — C778 Secondary and unspecified malignant neoplasm of lymph nodes of multiple regions: Secondary | ICD-10-CM | POA: Diagnosis not present

## 2023-12-01 LAB — CMP (CANCER CENTER ONLY)
ALT: 26 U/L (ref 0–44)
AST: 26 U/L (ref 15–41)
Albumin: 4.3 g/dL (ref 3.5–5.0)
Alkaline Phosphatase: 138 U/L — ABNORMAL HIGH (ref 38–126)
Anion gap: 11 (ref 5–15)
BUN: 10 mg/dL (ref 8–23)
CO2: 26 mmol/L (ref 22–32)
Calcium: 9.5 mg/dL (ref 8.9–10.3)
Chloride: 103 mmol/L (ref 98–111)
Creatinine: 0.53 mg/dL (ref 0.44–1.00)
GFR, Estimated: >60 mL/min (ref >60)
Glucose, Bld: 114 mg/dL — ABNORMAL HIGH (ref 70–99)
Potassium: 3.5 mmol/L (ref 3.5–5.1)
Sodium: 140 mmol/L (ref 135–145)
Total Bilirubin: 0.6 mg/dL (ref 0.0–1.2)
Total Protein: 6.5 g/dL (ref 6.5–8.1)

## 2023-12-01 LAB — CBC WITH DIFFERENTIAL (CANCER CENTER ONLY)
Abs Immature Granulocytes: 0.01 K/uL (ref 0.00–0.07)
Basophils Absolute: 0.1 K/uL (ref 0.0–0.1)
Basophils Relative: 1 %
Eosinophils Absolute: 0.1 K/uL (ref 0.0–0.5)
Eosinophils Relative: 1 %
HCT: 35.9 % — ABNORMAL LOW (ref 36.0–46.0)
Hemoglobin: 11.7 g/dL — ABNORMAL LOW (ref 12.0–15.0)
Immature Granulocytes: 0 %
Lymphocytes Relative: 21 %
Lymphs Abs: 1.2 K/uL (ref 0.7–4.0)
MCH: 30.1 pg (ref 26.0–34.0)
MCHC: 32.6 g/dL (ref 30.0–36.0)
MCV: 92.3 fL (ref 80.0–100.0)
Monocytes Absolute: 0.5 K/uL (ref 0.1–1.0)
Monocytes Relative: 8 %
Neutro Abs: 3.8 K/uL (ref 1.7–7.7)
Neutrophils Relative %: 69 %
Platelet Count: 243 K/uL (ref 150–400)
RBC: 3.89 MIL/uL (ref 3.87–5.11)
RDW: 16.3 % — ABNORMAL HIGH (ref 11.5–15.5)
WBC Count: 5.6 K/uL (ref 4.0–10.5)
nRBC: 0 % (ref 0.0–0.2)

## 2023-12-01 MED ORDER — SODIUM CHLORIDE 0.9 % IV SOLN
175.0000 mg/m2 | Freq: Once | INTRAVENOUS | Status: AC
  Administered 2023-12-01: 282 mg via INTRAVENOUS
  Filled 2023-12-01: qty 47

## 2023-12-01 MED ORDER — SODIUM CHLORIDE 0.9 % IV SOLN
353.5000 mg | Freq: Once | INTRAVENOUS | Status: AC
  Administered 2023-12-01: 350 mg via INTRAVENOUS
  Filled 2023-12-01: qty 35

## 2023-12-01 MED ORDER — FAMOTIDINE IN NACL 20-0.9 MG/50ML-% IV SOLN
20.0000 mg | Freq: Once | INTRAVENOUS | Status: AC
  Administered 2023-12-01: 20 mg via INTRAVENOUS
  Filled 2023-12-01: qty 50

## 2023-12-01 MED ORDER — PALONOSETRON HCL INJECTION 0.25 MG/5ML
0.2500 mg | Freq: Once | INTRAVENOUS | Status: AC
  Administered 2023-12-01: 0.25 mg via INTRAVENOUS
  Filled 2023-12-01: qty 5

## 2023-12-01 MED ORDER — DIPHENHYDRAMINE HCL 50 MG/ML IJ SOLN
50.0000 mg | Freq: Once | INTRAMUSCULAR | Status: AC
  Administered 2023-12-01: 50 mg via INTRAVENOUS
  Filled 2023-12-01: qty 1

## 2023-12-01 MED ORDER — SODIUM CHLORIDE 0.9 % IV SOLN: INTRAVENOUS | Status: DC

## 2023-12-01 MED ORDER — APREPITANT 130 MG/18ML IV EMUL
130.0000 mg | Freq: Once | INTRAVENOUS | Status: AC
  Administered 2023-12-01: 130 mg via INTRAVENOUS
  Filled 2023-12-01: qty 18

## 2023-12-01 MED ORDER — DEXAMETHASONE SOD PHOSPHATE PF 10 MG/ML IJ SOLN
10.0000 mg | Freq: Once | INTRAMUSCULAR | Status: AC
  Administered 2023-12-01: 10 mg via INTRAVENOUS

## 2023-12-01 NOTE — Patient Instructions (Signed)

## 2023-12-01 NOTE — Progress Notes (Signed)
 Patient seen by Dr. [name] today  Vitals are within treatment parameters:Yes   Labs are within treatment parameters: Yes   Treatment plan has been signed: Yes   Per physician team, Patient is ready for treatment. Please note the following modifications: Avastin has been removed from care plan as well as gcsf injection.

## 2023-12-01 NOTE — Progress Notes (Signed)
 [facility] OFFICE PROGRESS NOTE   Diagnosis: Fallopian tube cancer  INTERVAL HISTORY:   [name] completed another cycle of Taxol /carboplatin  on 11/10/2023.  No nausea/vomiting or neuropathy symptoms.  Diarrhea has resolved.  She has painful hemorrhoids.  Preparation H suppositories have helped.  She had malaise after receiving G-CSF. She had an upper respiratory infection 2 weeks ago.  She has a residual nonproductive cough and the left side of the throat is sore.  Objective:  Vital signs in last 24 hours:  Blood pressure 133/76, pulse 85, temperature 97.7 F (36.5 C), temperature source Temporal, resp. rate 18, height 5' 7 (1.702 m), weight 126 lb 14.4 oz (57.6 kg), last menstrual period 02/02/2011, SpO2 100%.    HEENT: No thrush.  Pharynx without erythema, ulcer, or exuda Resp: Coarse end inspiratory rhonchi at the upper posterior chest bilaterally, no respiratory distress Cardio: Regular rate and rhythm GI: Nontender, no hepatosplenomegaly, no apparent ascites Vascular: No leg edema   Portacath/PICC-without erythema  Lab Results:  Lab Results  Component Value Date   WBC 5.6 12/01/2023   HGB 11.7 (L) 12/01/2023   HCT 35.9 (L) 12/01/2023   MCV 92.3 12/01/2023   PLT 243 12/01/2023   NEUTROABS 3.8 12/01/2023    CMP  Lab Results  Component Value Date   NA 140 11/10/2023   K 3.8 11/10/2023   CL 103 11/10/2023   CO2 24 11/10/2023   GLUCOSE 97 11/10/2023   BUN 11 11/10/2023   CREATININE 0.52 11/10/2023   CALCIUM  9.6 11/10/2023   PROT 6.7 11/10/2023   ALBUMIN  4.4 11/10/2023   AST 37 11/10/2023   ALT 58 (H) 11/10/2023   ALKPHOS 190 (H) 11/10/2023   BILITOT 0.7 11/10/2023   GFRNONAA >60 11/10/2023   GFRAA >60 02/01/2015    Lab Results  Component Value Date   CEA 5.86 (H) 05/27/2023    Medications: I have reviewed the patient's current medications.   Assessment/Plan: Fallopian tube carcinoma, right fallopian tube CT abdomen/pelvis  05/13/2023-5.0 cm apple core lesion in the upper/mid rectum.  Peritoneal disease/omental caking with multiple cystic metastases noted as well as abdominopelvic lymphadenopathy.  Colonoscopy 05/25/2023-infiltrative partially obstructing large mass in the rectosigmoid colon from 20 to 28 cm from the anal verge.  The mass was circumferential.  Oozing was present.  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 Monia 3 cycles of chemotherapy then repeat imaging, discuss if she is a candidate for debulking surgery Cycle 2 Taxol /carboplatin  06/23/2023, Udenyca  Cycle 3 Taxol /carboplatin  07/15/2023, Udenyca  CTs 08/02/2023-moderate to marked response to therapy of lower cervical, thoracic, abdominal and pelvic nodal metastasis.  Moderate response of abdominopelvic omental/peritoneal metastasis.  Similar to minimal decrease in size of a dominant left pelvic mass.  New mild left-sided hydroureteronephrosis likely secondary to the dominant mass. Cycle 4 Taxol /carboplatin  08/04/2023, Udenyca  09/01/2023-diagnostic laparoscopy, BSO, omentectomy, rectosigmoid resection, pelvic sidewall biopsy, bladder peritoneum biopsy, sigmoid nodule, small bowel mesenteric nodule resection.  High-grade carcinoma with psammoma calcifications and fibrosis involving the omentum, right fallopian tube and right ovary, left ovary, rectosigmoid colon, 4/20 lymph nodes positive, fibrosis with psammoma calcifications at  the pelvic sidewall and  bladder peritoneum biopsies, high-grade carcinoma involving the sigmoid colon nodule, fibrotic nodule with numerous psammoma calcifications at the small bowel mesentery nodule; high-grade serous carcinoma, FIGO grade 3, chemotherapy response score 2,[id number] Foundation One: MSI stable, HRDsig negative TMB 4 Muts/Mb, BRCA1 rearrangement exon 10 (Germline testing BRCA negative 06/18/23) Cycle 5 Taxol /Carboplatin  10/20/2023, Udenyca  Cycle 6 Taxol /carboplatin  11/10/2023, Udenyca  Cycle 7 Taxol /carboplatin  12/01/2023  Hypertension 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 difficile colitis 09/16/2023; vanc taper starting 10/13/23, completed 10/29/2023        Disposition: Debra Nguyen is in clinical remission from fallopian tube carcinoma.  She will complete a final cycle of adjuvant Taxol /carboplatin  today.  We discussed Avastin maintenance.  We reviewed potential toxicities associated with Avastin.  We decided to hold Avastin for now.  She will return for an office visit in 3 weeks.  We will refer her for restaging CTs after the next office visit.  The CA125 was normal 10/20/2023.  We will follow-up on the CA125 from today.  The plan is to start a maintenance PARP inhibitor after the next office visit.  We will also consider starting maintenance Avastin.  She will not receive G-CSF with this cycle of chemotherapy.  She will call for a fever or symptoms of an infection.  Debra Hof, MD  12/01/2023  9:10 AM

## 2023-12-01 NOTE — Patient Instructions (Signed)
 [facility] - A DEPT OF [facility]  Discharge Instructions: Thank you for choosing [facility] to provide your oncology and hematology care.   If you have a lab appointment with the Cancer Center, please go directly to the Cancer Center and check in at the registration area.   Wear comfortable clothing and clothing appropriate for easy access to any Portacath or PICC line.   We strive to give you quality time with your provider. You may need to reschedule your appointment if you arrive late (15 or more minutes).  Arriving late affects you and other patients whose appointments are after yours.  Also, if you miss three or more appointments without notifying the office, you may be dismissed from the clinic at the provider's discretion.      For prescription refill requests, have your pharmacy contact our office and allow 72 hours for refills to be completed.    Today you received the following chemotherapy and/or immunotherapy agents: taxol  and carbo      To help prevent nausea and vomiting after your treatment, we encourage you to take your nausea medication as directed.  BELOW ARE SYMPTOMS THAT SHOULD BE REPORTED IMMEDIATELY: *FEVER GREATER THAN 100.4 F (38 C) OR HIGHER *CHILLS OR SWEATING *NAUSEA AND VOMITING THAT IS NOT CONTROLLED WITH YOUR NAUSEA MEDICATION *UNUSUAL SHORTNESS OF BREATH *UNUSUAL BRUISING OR BLEEDING *URINARY PROBLEMS (pain or burning when urinating, or frequent urination) *BOWEL PROBLEMS (unusual diarrhea, constipation, pain near the anus) TENDERNESS IN MOUTH AND THROAT WITH OR WITHOUT PRESENCE OF ULCERS (sore throat, sores in mouth, or a toothache) UNUSUAL RASH, SWELLING OR PAIN  UNUSUAL VAGINAL DISCHARGE OR ITCHING   Items with * indicate a potential emergency and should be followed up as soon as possible or go to the Emergency Department if any problems should occur.  Please show the CHEMOTHERAPY ALERT CARD or  IMMUNOTHERAPY ALERT CARD at check-in to the Emergency Department and triage nurse.  Should you have questions after your visit or need to cancel or reschedule your appointment, please contact [facility] - A DEPT OF [facility]  Dept: [phone]  and follow the prompts.  Office hours are 8:00 a.m. to 4:30 p.m. Monday - Friday. Please note that voicemails left after 4:00 p.m. may not be returned until the following business day.  We are closed weekends and major holidays. You have access to a nurse at all times for urgent questions. Please call the main number to the clinic Dept: [phone] and follow the prompts.   For any non-urgent questions, you may also contact your provider using MyChart. We now offer e-Visits for anyone 84 and older to request care online for non-urgent symptoms. For details visit [email or web address].   Also download the MyChart app! Go to the app store, search MyChart, open the app, select [facility], and log in with your MyChart username and password.

## 2023-12-02 LAB — CA 125: Cancer Antigen (CA) 125: 8.5 U/mL (ref 0.0–38.1)

## 2023-12-03 ENCOUNTER — Ambulatory Visit

## 2023-12-08 ENCOUNTER — Telehealth: Payer: Self-pay | Admitting: *Deleted

## 2023-12-08 NOTE — Telephone Encounter (Signed)
 Spoke with [name] and relayed message from Debra Epps, NP that patient can see [facility] recommended to try over the counter Nupercainal with dibucaine. Pt verbalized understanding.   Pt states she would still like to try Rx 25 mg anusol hydrocortisone suppositories?  Advised patient due to patient's bowel surgery this may not be appropriate, pt thought that maybe since she is 14 weeks post op? Pt would also like to try miralax  as needed to soften her stools so the pain is not that intense.   [name] also mentioned that [name] had referred her to [name] because patient doesn't want to return back to [place]. [name] office stated that he is no longer accepting patient's with medicaid at this time?  Patient would like to ask [name] for a referral to [name], patient did some research and was hoping she would be able to help her? Advised patient that her message will be relayed to [name]. Pt thanked the office for calling back.

## 2023-12-08 NOTE — Telephone Encounter (Signed)
 Spoke with Debra Nguyen who called the office stating that she started having rectal pain again with bowel movements. Pt is having several small formed stools and she is having to strain (not because of constipation) but because she feels right at the end of her rectum and anal area it feels constricted and it hurts when she passes stool. She does report a few external hemorrhoids which she is using preparation H cream, sitz bath and witch hazel, but because the frequency and the pain while having to strain it's causing her to become nausea and lightheaded. Pt reports she started using the Preparation H suppositories again once last night and then again early this morning.   Patient reports having small meals throughout the day and keeping herself well hydrated. Pt is 14 weeks post op and was wondering if [name] would prescribe miralax  for at bedtime and or as needed? Or any Rx strength suppository or stronger cream?  Advised patient her message will be relayed to providers and the office will call back with any new recommendations. Pt thanked the office.

## 2023-12-09 ENCOUNTER — Encounter: Payer: Self-pay | Admitting: Oncology

## 2023-12-09 ENCOUNTER — Other Ambulatory Visit: Payer: Self-pay | Admitting: Gynecologic Oncology

## 2023-12-09 ENCOUNTER — Other Ambulatory Visit (HOSPITAL_BASED_OUTPATIENT_CLINIC_OR_DEPARTMENT_OTHER): Payer: Self-pay

## 2023-12-09 DIAGNOSIS — K6289 Other specified diseases of anus and rectum: Secondary | ICD-10-CM

## 2023-12-09 MED ORDER — HYDROCORTISONE ACETATE 25 MG RE SUPP
25.0000 mg | Freq: Two times a day (BID) | RECTAL | 1 refills | Status: DC
  Filled 2023-12-09: qty 12, 6d supply, fill #0
  Filled 2023-12-21: qty 12, 6d supply, fill #1

## 2023-12-09 NOTE — Progress Notes (Signed)
 See RN note.

## 2023-12-09 NOTE — Telephone Encounter (Signed)
-----   Message from Debra Nguyen sent at 12/09/2023 10:10 AM EDT ----- Several things for her:  1) I have sent in the anusol suppositories to her pharmacy. Would place these slowly and gentle.  2) She can try introducing miralax  to make her stools softer. I would recommend starting with a smaller dose (once daily) to see how she does.   3) We talked with Dr. Debby today in the OR. She said she would be happy to see her but [name] recommends trying the medications first. When finished with treatment if things are not improved, she is recommending a CT scan with rectal contrast (this would be 3 months after surgery) to see if there is a stricture or to evaluate cause for pain.

## 2023-12-09 NOTE — Telephone Encounter (Signed)
 Spoke with [name] and relayed message from [name], NP.  The Anusol Suppositories have been sent to patients pharmacy, would place these slowly and gentle.   Patient can try introducing miralax  to make her stools softer. Recommendations are to start with a smaller dose (once daily) to see how pt does.   [name] spoke with Dr. Debby today and she would be happy to see patient. [name] recommends trying the medication and recommendations first. When finished with treatment if things are not improved, she is recommending a CT scan with rectal contrast (three months after surgery date) to see if there is a stricture or to evaluate cause of pain.   Patient verbalized understanding and thanked the office and will reach out in about 1-2 weeks after the above recommendations to let us  know how she is feeling.

## 2023-12-16 ENCOUNTER — Telehealth: Payer: Self-pay | Admitting: *Deleted

## 2023-12-16 ENCOUNTER — Other Ambulatory Visit: Payer: Self-pay | Admitting: Gynecologic Oncology

## 2023-12-16 ENCOUNTER — Encounter: Payer: Self-pay | Admitting: Gynecologic Oncology

## 2023-12-16 DIAGNOSIS — C579 Malignant neoplasm of female genital organ, unspecified: Secondary | ICD-10-CM

## 2023-12-16 DIAGNOSIS — K6289 Other specified diseases of anus and rectum: Secondary | ICD-10-CM

## 2023-12-16 NOTE — Telephone Encounter (Signed)
 Spoke with Ms. Donlon who called the office to give us  an update on her symptoms. Patient states she completed her 6 days of hydrocortisone Suppository Rx. And right away after the first dose she had great relief. Pt states her symptoms went from night to day   She completed the last dose on Tuesday evening (10/28) the discomfort is back a little but not as bad.   She is worried that after sometime the pain will return. Patient states that there is something not quite right in her rectum and she has to lean back with her legs extended (has to straighten her body almost) while having a bowel  movement to make it easier to go.   Patient reports that she has refills on the suppositories but wanted to report how she was doing before refilling Rx?  Patient would like to move forward with referral to see Dr. Debby. States she went back and reviewed her CT scan report from August 7th. In regards to the radiologist report of a thickening of the anal verge?  She states she has an appointment with [name] on 11/5 and will start her parp inhibitor oral medication. Patient has a question for [name] and will send her a [employer].   Advised patient her message will be relayed to providers. Pt thanked the office.

## 2023-12-16 NOTE — Telephone Encounter (Signed)
 Thanks Ami - great -so glad to hear that she is doing much better with the suppositories.  I think it would actually be best to get her into see GI once she has healed completely.  I am still happy to send her to Dr. Debby, but I think that is what Dr. Debby is going to recommend.

## 2023-12-17 ENCOUNTER — Telehealth: Payer: Self-pay | Admitting: *Deleted

## 2023-12-17 NOTE — Telephone Encounter (Signed)
 Per Dr Viktoria scheduled patient for a CT. Patient scheduled for 11/3 at 5 pm at [facility]. Patient given appt date/time and instructions

## 2023-12-17 NOTE — Telephone Encounter (Signed)
 Spoke with Ms. Mcdiarmid and relayed message from [name] that she is glad patient is doing much better with the suppositories. And feels it would be best to get her into see GI once she has healed completely. [name] is still happy to send her to Dr. Debby, but that Dr. Debby would recommend GI first as well.  Dr.Tucker has a CT scan with rectal contrast ordered that the office can get that scheduled. Pt verbalized understanding and would like to move forward with the CT scan. Pt is still looking for a GI to follow up with. Pt mentioned [name], Dr. Estelita Manas, and Dr. Belvie Just. Advised patient her message will be relayed to providers. Pt thanked the office for calling.

## 2023-12-20 ENCOUNTER — Ambulatory Visit (HOSPITAL_BASED_OUTPATIENT_CLINIC_OR_DEPARTMENT_OTHER)

## 2023-12-20 ENCOUNTER — Telehealth: Payer: Self-pay | Admitting: *Deleted

## 2023-12-20 ENCOUNTER — Encounter: Payer: Self-pay | Admitting: Oncology

## 2023-12-20 DIAGNOSIS — C579 Malignant neoplasm of female genital organ, unspecified: Secondary | ICD-10-CM

## 2023-12-20 DIAGNOSIS — C569 Malignant neoplasm of unspecified ovary: Secondary | ICD-10-CM

## 2023-12-20 NOTE — Telephone Encounter (Signed)
 Call from Jamie with GYN Oncology that patient needs GI referral to Dr. Saintclair or Dr. Rollin for rectal pain with having BM and difficulty pushing stool from rectum. She had referral to [name], but she no longer accepts Medicare patients. Patient prefers not to return to [facility]. Referral order and medical records sent to [facility].

## 2023-12-21 ENCOUNTER — Telehealth: Payer: Self-pay | Admitting: *Deleted

## 2023-12-21 NOTE — Telephone Encounter (Signed)
 Spoke with the patient regarding the CT scan from yesterday that was canceled. Per the patient [facility] can't give rectal contrast; that has to be done at the hospital. Patient stated I have an appt with Dr Cloretta tomorrow and it is time for my restaging CT C/A/P and I want to do them all together. The scheduler from centralized scheduling said that can be done.   Explained to the patient that our office will follow up with the appt tomorrow with Dr Cloretta and follow the scan.

## 2023-12-22 ENCOUNTER — Inpatient Hospital Stay: Attending: Nurse Practitioner

## 2023-12-22 ENCOUNTER — Telehealth: Payer: Self-pay | Admitting: Pharmacist

## 2023-12-22 ENCOUNTER — Telehealth: Payer: Self-pay | Admitting: Pharmacy Technician

## 2023-12-22 ENCOUNTER — Other Ambulatory Visit: Payer: Self-pay | Admitting: Pharmacy Technician

## 2023-12-22 ENCOUNTER — Encounter: Payer: Self-pay | Admitting: *Deleted

## 2023-12-22 ENCOUNTER — Other Ambulatory Visit (HOSPITAL_COMMUNITY): Payer: Self-pay

## 2023-12-22 ENCOUNTER — Encounter: Payer: Self-pay | Admitting: Oncology

## 2023-12-22 ENCOUNTER — Inpatient Hospital Stay

## 2023-12-22 ENCOUNTER — Inpatient Hospital Stay: Admitting: Oncology

## 2023-12-22 ENCOUNTER — Other Ambulatory Visit: Payer: Self-pay | Admitting: *Deleted

## 2023-12-22 ENCOUNTER — Other Ambulatory Visit: Payer: Self-pay

## 2023-12-22 ENCOUNTER — Ambulatory Visit

## 2023-12-22 ENCOUNTER — Other Ambulatory Visit (HOSPITAL_BASED_OUTPATIENT_CLINIC_OR_DEPARTMENT_OTHER): Payer: Self-pay

## 2023-12-22 VITALS — BP 133/79 | HR 82 | Temp 97.8°F | Resp 18 | Ht 67.0 in | Wt 127.7 lb

## 2023-12-22 DIAGNOSIS — K6289 Other specified diseases of anus and rectum: Secondary | ICD-10-CM | POA: Insufficient documentation

## 2023-12-22 DIAGNOSIS — C786 Secondary malignant neoplasm of retroperitoneum and peritoneum: Secondary | ICD-10-CM | POA: Insufficient documentation

## 2023-12-22 DIAGNOSIS — C569 Malignant neoplasm of unspecified ovary: Secondary | ICD-10-CM

## 2023-12-22 DIAGNOSIS — I10 Essential (primary) hypertension: Secondary | ICD-10-CM | POA: Insufficient documentation

## 2023-12-22 DIAGNOSIS — Z9221 Personal history of antineoplastic chemotherapy: Secondary | ICD-10-CM | POA: Diagnosis not present

## 2023-12-22 DIAGNOSIS — C579 Malignant neoplasm of female genital organ, unspecified: Secondary | ICD-10-CM

## 2023-12-22 DIAGNOSIS — C778 Secondary and unspecified malignant neoplasm of lymph nodes of multiple regions: Secondary | ICD-10-CM | POA: Insufficient documentation

## 2023-12-22 DIAGNOSIS — C5701 Malignant neoplasm of right fallopian tube: Secondary | ICD-10-CM | POA: Insufficient documentation

## 2023-12-22 LAB — CBC WITH DIFFERENTIAL (CANCER CENTER ONLY)
Abs Immature Granulocytes: 0.01 K/uL (ref 0.00–0.07)
Basophils Absolute: 0 K/uL (ref 0.0–0.1)
Basophils Relative: 1 %
Eosinophils Absolute: 0.1 K/uL (ref 0.0–0.5)
Eosinophils Relative: 2 %
HCT: 37 % (ref 36.0–46.0)
Hemoglobin: 12.5 g/dL (ref 12.0–15.0)
Immature Granulocytes: 0 %
Lymphocytes Relative: 29 %
Lymphs Abs: 1.2 K/uL (ref 0.7–4.0)
MCH: 30.6 pg (ref 26.0–34.0)
MCHC: 33.8 g/dL (ref 30.0–36.0)
MCV: 90.5 fL (ref 80.0–100.0)
Monocytes Absolute: 0.4 K/uL (ref 0.1–1.0)
Monocytes Relative: 9 %
Neutro Abs: 2.4 K/uL (ref 1.7–7.7)
Neutrophils Relative %: 59 %
Platelet Count: 214 K/uL (ref 150–400)
RBC: 4.09 MIL/uL (ref 3.87–5.11)
RDW: 16.3 % — ABNORMAL HIGH (ref 11.5–15.5)
WBC Count: 4.1 K/uL (ref 4.0–10.5)
nRBC: 0 % (ref 0.0–0.2)

## 2023-12-22 LAB — CMP (CANCER CENTER ONLY)
ALT: 99 U/L — ABNORMAL HIGH (ref 0–44)
AST: 60 U/L — ABNORMAL HIGH (ref 15–41)
Albumin: 4.1 g/dL (ref 3.5–5.0)
Alkaline Phosphatase: 149 U/L — ABNORMAL HIGH (ref 38–126)
Anion gap: 11 (ref 5–15)
BUN: 12 mg/dL (ref 8–23)
CO2: 26 mmol/L (ref 22–32)
Calcium: 9.4 mg/dL (ref 8.9–10.3)
Chloride: 101 mmol/L (ref 98–111)
Creatinine: 0.59 mg/dL (ref 0.44–1.00)
GFR, Estimated: >60 mL/min (ref >60)
Glucose, Bld: 100 mg/dL — ABNORMAL HIGH (ref 70–99)
Potassium: 3.8 mmol/L (ref 3.5–5.1)
Sodium: 138 mmol/L (ref 135–145)
Total Bilirubin: 0.6 mg/dL (ref 0.0–1.2)
Total Protein: 6.5 g/dL (ref 6.5–8.1)

## 2023-12-22 MED ORDER — OLAPARIB 150 MG PO TABS
300.0000 mg | ORAL_TABLET | Freq: Two times a day (BID) | ORAL | 0 refills | Status: DC
  Filled 2023-12-22: qty 120, 30d supply, fill #0

## 2023-12-22 NOTE — Progress Notes (Signed)
 [facility] OFFICE PROGRESS NOTE   Diagnosis: Fallopian tube carcinoma  INTERVAL HISTORY:   Debra Nguyen returns as scheduled.  She completed another cycle of chemotherapy 12/01/2023.  She had malaise following chemotherapy.  She developed rectal pain with bowel movements.  The pain improved when she started using Anusol suppositories.  [name] has ordered a CT with rectal contrast.  Objective:  Vital signs in last 24 hours:  Blood pressure 133/79, pulse 82, temperature 97.8 F (36.6 C), temperature source Temporal, resp. rate 18, height 5' 7 (1.702 m), weight 127 lb 11.2 oz (57.9 kg), last menstrual period 02/02/2011, SpO2 100%.    HEENT: No thrush or ulcers Resp: Lungs clear bilaterally Cardio: Regular rate and rhythm GI: No hepatosplenomegaly, nontender, no apparent ascites Vascular: No leg edema   Portacath/PICC-without erythema  Lab Results:  Lab Results  Component Value Date   WBC 4.1 12/22/2023   HGB 12.5 12/22/2023   HCT 37.0 12/22/2023   MCV 90.5 12/22/2023   PLT 214 12/22/2023   NEUTROABS 2.4 12/22/2023    CMP  Lab Results  Component Value Date   NA 140 12/01/2023   K 3.5 12/01/2023   CL 103 12/01/2023   CO2 26 12/01/2023   GLUCOSE 114 (H) 12/01/2023   BUN 10 12/01/2023   CREATININE 0.53 12/01/2023   CALCIUM  9.5 12/01/2023   PROT 6.5 12/01/2023   ALBUMIN  4.3 12/01/2023   AST 26 12/01/2023   ALT 26 12/01/2023   ALKPHOS 138 (H) 12/01/2023   BILITOT 0.6 12/01/2023   GFRNONAA >60 12/01/2023   GFRAA >60 02/01/2015    Lab Results  Component Value Date   CEA 5.86 (H) 05/27/2023    No results found for: INR, LABPROT  Imaging:  No results found.  Medications: I have reviewed the patient's current medications.   Assessment/Plan: Fallopian tube carcinoma, right fallopian tube CT abdomen/pelvis 05/13/2023-5.0 cm apple core lesion in the upper/mid rectum.  Peritoneal disease/omental caking with multiple cystic metastases  noted as well as abdominopelvic lymphadenopathy.  Colonoscopy 05/25/2023-infiltrative partially obstructing large mass in the rectosigmoid colon from 20 to 28 cm from the anal verge.  The mass was circumferential.  Oozing was present.  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 Monia 3 cycles of chemotherapy then repeat imaging, discuss if she is a candidate for debulking surgery Cycle 2 Taxol /carboplatin  06/23/2023, Udenyca  Cycle 3 Taxol /carboplatin  07/15/2023, Udenyca  CTs 08/02/2023-moderate to marked response to therapy of lower cervical, thoracic, abdominal and pelvic nodal metastasis.  Moderate response of abdominopelvic omental/peritoneal metastasis.  Similar to minimal decrease in size of a dominant left pelvic mass.  New mild left-sided hydroureteronephrosis likely secondary to the dominant mass. Cycle 4 Taxol /carboplatin  08/04/2023, Udenyca  09/01/2023-diagnostic laparoscopy, BSO, omentectomy, rectosigmoid resection, pelvic sidewall biopsy, bladder peritoneum biopsy, sigmoid nodule, small bowel mesenteric nodule resection.  High-grade carcinoma with psammoma calcifications and fibrosis involving the omentum, right fallopian tube and right ovary, left ovary, rectosigmoid colon, 4/20 lymph nodes positive, fibrosis with psammoma calcifications at the pelvic sidewall and bladder peritoneum biopsies, high-grade carcinoma involving the sigmoid colon nodule, fibrotic nodule  with numerous psammoma calcifications at the small  bowel mesentery nodule; high-grade serous carcinoma, FIGO grade 3, chemotherapy response score 2,[id number] Foundation One: MSI stable, HRDsig negative TMB 4 Muts/Mb, BRCA1 rearrangement exon 10 (Germline testing BRCA negative 06/18/23) Cycle 5 Taxol /Carboplatin  10/20/2023, Udenyca  Cycle 6 Taxol /carboplatin  11/10/2023, Udenyca  Cycle 7 Taxol /carboplatin  12/01/2023 12/24/2023 olaparib  Hypertension 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 difficile colitis 09/16/2023; vanc taper starting 10/13/23, completed 10/29/2023       Disposition: Debra Nguyen has completed the planned course of adjuvant chemotherapy.  The CA125 was normal on 12/01/2023.  We discussed the indication for maintenance therapy.  I recommend olaparib.  We also discussed bevacizumab therapy.  I do not recommend bevacizumab since she did not receive bevacizumab during the course of chemotherapy.  We reviewed potential toxicities associated with olaparib including the chance of diarrhea, nausea, rash, and hematologic toxicity.  She agrees to proceed.  The plan is to begin olaparib this week.  Debra Nguyen will undergo restaging CTs to include a pelvic CT with rectal contrast.  She will return for an office visit on 01/03/2024.  [name], MD  12/22/2023  9:25 AM

## 2023-12-22 NOTE — Telephone Encounter (Signed)
 Clinical Pharmacist Practitioner Encounter   Patient will pick up medication from [facility] on 12/23/23.  Patient knows to start once they have medication in hand.  Patient Education I spoke with patient for overview of new oral chemotherapy medication: Lynparza (olaparib) for the maintenance treatment of Somatic BRCA Mutation Advanced Fallopian Tube Cancer as maintence therapy following platin modality. Planned duration for 2 years or until disease progression and/or intolerable adverse event.   Treatment goal: Control  Counseled patient on administration, dosing, side effects, monitoring, drug-food interactions, safe handling, storage, and disposal. Patient will take 2 tablets (300 mg total) by mouth 2 (two) times daily. Swallow whole. May take with food to decrease nausea and vomiting.   Side effects include but not limited to: nausea, diarrhea, .   Diarrhea: patient knows to use loperamide as needed and call the office if they are having four or more loose stool per day Nausea: patient reports having antiemetics at home   Reviewed with patient importance of keeping a medication schedule and plan for any missed doses.  After discussion with patient no patient barriers to medication adherence identified.   Distress evaluation: Distress thermometer not completed during telephone call as patient has been on previous lines of therapy.   Communication and Learning Assessment Primary learner: patient Barriers to learning: No barriers Preferred language: English Learning preferences: Listening Reading  Debra Nguyen voiced understanding and appreciation. All questions answered. Medication handout provided.  Provided patient with Oral Chemotherapy Navigation Clinic phone number. Patient knows to call the office with questions or concerns. Oral Chemotherapy Navigation Clinic will continue to follow.  Debra Nguyen, PharmD, BCOP, CPP Hematology/Oncology Clinical  Pharmacist [facility] Oral Chemotherapy Navigation Clinic [phone]  12/22/2023 1:26 PM

## 2023-12-22 NOTE — Telephone Encounter (Signed)
 Oral Oncology Patient Advocate Encounter   New authorization   Received notification that prior authorization for Daron is required.   PA submitted on CMM via Latent Key [id number] Status is pending     Khamila Bassinger ([name]) Chet Burnet, CPhT  [facility], [place], [facility] Hematology/Oncology - Oral Chemotherapy Patient Advocate Specialist III Phone: [phone]  Fax: [phone]

## 2023-12-22 NOTE — Telephone Encounter (Signed)
 Oral Oncology Patient Advocate Encounter  Prior Authorization for Daron has been approved.    PA# [id number] Effective dates: 12/22/2023 through 12/21/2024  Patients co-pay is $1,151.17.    Taraann Olthoff ([name]) Chet Burnet, CPhT  [facility], [place], [facility] Hematology/Oncology - Oral Chemotherapy Patient Advocate Specialist III Phone: [phone]  Fax: [phone]

## 2023-12-22 NOTE — Telephone Encounter (Signed)
 Oral Oncology Patient Advocate Encounter  Patient successfully OnBoarded and drug education provided by pharmacist. Medication scheduled to be filled on 11/06 for pickup on 11/06 from [facility]. Patient also knows to call me at [phone] with any questions or concerns regarding receiving medication or if there is any unexpected change in co-pay.      Debra Nguyen ([name]) Debra Nguyen, CPhT  [facility], [place], [facility] Hematology/Oncology - Oral Chemotherapy Patient Advocate Specialist III Phone: [phone]  Fax: [phone]

## 2023-12-22 NOTE — Progress Notes (Signed)
 Specialty Pharmacy Initial Fill Coordination Note  Debra Nguyen is a 68 y.o. female contacted today regarding refills of specialty medication(s) Olaparib (LYNPARZA) .  Patient requested [name] at [facility] [place]  on 12/23/23   Medication will be filled on 11/06.   Patient is aware of $0 copayment. Leisure centre manager.  Zanden Colver ([name]) Chet Burnet, CPhT  [facility], [place], [facility] Hematology/Oncology - Oral Chemotherapy Patient Advocate Specialist III Phone: [phone]  Fax: [phone]

## 2023-12-22 NOTE — Patient Instructions (Signed)

## 2023-12-22 NOTE — Progress Notes (Signed)
 Patient education documented in EPIC note on 03/03/23.

## 2023-12-22 NOTE — Telephone Encounter (Signed)
 Student Pharmacist Encounter   Received new prescription for Lynparza (olaparib) for the treatment of Somatic BRCA Mutation Advanced Fallopian Tube Cancer as maintence therapy following platin modality. Planned duration for 2 years or until disease progression and/or intolerable adverse event.  CBC & CMP from 12/22/23 we assessed, all within normal limits. AST and ALT for this patient are slightly elevated however, no recommendations for dose adjustments based on liver function with Lynparza. Prescription dose and frequency assessed. New recommendation below:  - Initiate Lynparza 300 mg BID (2 tablets by mouth twice daily)  - adjust dose to 200 mg BID if renal function/CrCl drops below 50 mL/min.  - Current CrCl: 59.8 mL/min   Current medication list in Epic reviewed, no DDIs with Lynparza identified:  Evaluated chart and no patient barriers to medication adherence identified.   Prescription has been e-scribed to the [facility] for benefits analysis and approval.  Oral Oncology Clinic will continue to follow for insurance authorization, copayment issues, initial counseling and start date.   Fredia Main, PharmD Candidate 2026 [facility] Oral Chemotherapy Navigation Clinic [phone]  12/22/2023 11:37 AM

## 2023-12-23 LAB — CA 125: Cancer Antigen (CA) 125: 8.6 U/mL (ref 0.0–38.1)

## 2023-12-24 ENCOUNTER — Ambulatory Visit

## 2023-12-25 ENCOUNTER — Other Ambulatory Visit: Payer: Self-pay | Admitting: Oncology

## 2023-12-27 ENCOUNTER — Other Ambulatory Visit (HOSPITAL_BASED_OUTPATIENT_CLINIC_OR_DEPARTMENT_OTHER): Payer: Self-pay

## 2023-12-27 MED ORDER — POTASSIUM CHLORIDE ER 10 MEQ PO CPCR
10.0000 meq | ORAL_CAPSULE | Freq: Two times a day (BID) | ORAL | 2 refills | Status: AC
  Filled 2023-12-29: qty 60, 30d supply, fill #0
  Filled 2024-01-25: qty 60, 30d supply, fill #1
  Filled 2024-03-07: qty 60, 30d supply, fill #2
  Filled ????-??-??: fill #0

## 2023-12-29 ENCOUNTER — Ambulatory Visit (HOSPITAL_COMMUNITY)
Admission: RE | Admit: 2023-12-29 | Discharge: 2023-12-29 | Disposition: A | Discharge status: Home / Self Care | Source: Ambulatory Visit | Attending: Oncology | Admitting: Oncology

## 2023-12-29 ENCOUNTER — Encounter: Payer: Self-pay | Admitting: *Deleted

## 2023-12-29 ENCOUNTER — Other Ambulatory Visit (HOSPITAL_BASED_OUTPATIENT_CLINIC_OR_DEPARTMENT_OTHER): Payer: Self-pay

## 2023-12-29 DIAGNOSIS — N200 Calculus of kidney: Secondary | ICD-10-CM | POA: Diagnosis not present

## 2023-12-29 DIAGNOSIS — I700 Atherosclerosis of aorta: Secondary | ICD-10-CM | POA: Diagnosis not present

## 2023-12-29 DIAGNOSIS — Z9049 Acquired absence of other specified parts of digestive tract: Secondary | ICD-10-CM | POA: Insufficient documentation

## 2023-12-29 DIAGNOSIS — C569 Malignant neoplasm of unspecified ovary: Secondary | ICD-10-CM | POA: Diagnosis present

## 2023-12-29 DIAGNOSIS — Z980 Intestinal bypass and anastomosis status: Secondary | ICD-10-CM | POA: Insufficient documentation

## 2023-12-29 DIAGNOSIS — K6289 Other specified diseases of anus and rectum: Secondary | ICD-10-CM | POA: Diagnosis present

## 2023-12-29 MED ORDER — IOHEXOL 300 MG/ML  SOLN
100.0000 mL | Freq: Once | INTRAMUSCULAR | Status: AC | PRN
  Administered 2023-12-29: 100 mL via INTRAVENOUS

## 2023-12-29 MED ORDER — IOHEXOL 9 MG/ML PO SOLN
500.0000 mL | ORAL | Status: AC
Start: 2023-12-29 — End: 2023-12-29
  Administered 2023-12-29 (×2): 500 mL via ORAL

## 2023-12-29 MED ORDER — IOHEXOL 9 MG/ML PO SOLN
ORAL | Status: AC
  Filled 2023-12-29: qty 1000

## 2024-01-03 ENCOUNTER — Inpatient Hospital Stay (HOSPITAL_BASED_OUTPATIENT_CLINIC_OR_DEPARTMENT_OTHER): Admitting: Oncology

## 2024-01-03 VITALS — BP 125/73 | HR 90 | Temp 97.8°F | Resp 18 | Ht 67.0 in | Wt 131.5 lb

## 2024-01-03 DIAGNOSIS — C569 Malignant neoplasm of unspecified ovary: Secondary | ICD-10-CM

## 2024-01-03 DIAGNOSIS — C786 Secondary malignant neoplasm of retroperitoneum and peritoneum: Secondary | ICD-10-CM | POA: Diagnosis not present

## 2024-01-03 NOTE — Progress Notes (Signed)
 [facility] OFFICE PROGRESS NOTE   Diagnosis: Fallopian tube carcinoma  INTERVAL HISTORY:   Debra Nguyen returns as scheduled.  She began olaparib on 12/24/2023.  No rash, mouth sores, or diarrhea.  She feels well.  She continues to have rectal discomfort with bowel movements.  She is scheduled to see Dr. Rollin this week.  Objective:  Vital signs in last 24 hours:  Blood pressure 125/73, pulse 90, temperature 97.8 F (36.6 C), temperature source Temporal, resp. rate 18, height 5' 7 (1.702 m), weight 131 lb 8 oz (59.6 kg), last menstrual period 02/02/2011, SpO2 100%.    HEENT: No thrush or ulcers Resp: Lungs clear bilaterally Cardio: Regular rate and rhythm GI: No hepatosplenomegaly, nontender, no mass Vascular: No leg edema   Portacath/PICC-without erythema  Lab Results:  Lab Results  Component Value Date   WBC 4.1 12/22/2023   HGB 12.5 12/22/2023   HCT 37.0 12/22/2023   MCV 90.5 12/22/2023   PLT 214 12/22/2023   NEUTROABS 2.4 12/22/2023    CMP  Lab Results  Component Value Date   NA 138 12/22/2023   K 3.8 12/22/2023   CL 101 12/22/2023   CO2 26 12/22/2023   GLUCOSE 100 (H) 12/22/2023   BUN 12 12/22/2023   CREATININE 0.59 12/22/2023   CALCIUM  9.4 12/22/2023   PROT 6.5 12/22/2023   ALBUMIN  4.1 12/22/2023   AST 60 (H) 12/22/2023   ALT 99 (H) 12/22/2023   ALKPHOS 149 (H) 12/22/2023   BILITOT 0.6 12/22/2023   GFRNONAA >60 12/22/2023   GFRAA >60 02/01/2015    Lab Results  Component Value Date   CEA 5.86 (H) 05/27/2023     Medications: I have reviewed the patient's current medications.   Assessment/Plan: Fallopian tube carcinoma, right fallopian tube CT abdomen/pelvis 05/13/2023-5.0 cm apple core lesion in the upper/mid rectum.  Peritoneal disease/omental caking with multiple cystic metastases noted as well as abdominopelvic lymphadenopathy.  Colonoscopy 05/25/2023-infiltrative partially obstructing large mass in the rectosigmoid colon from  20 to 28 cm from the anal verge.  The mass was circumferential.  Oozing was present.  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 Monia 3 cycles of chemotherapy then repeat imaging, discuss if she is a candidate for debulking surgery Cycle 2 Taxol /carboplatin  06/23/2023, Udenyca  Cycle 3 Taxol /carboplatin  07/15/2023, Udenyca  CTs 08/02/2023-moderate to marked response to therapy of lower cervical, thoracic, abdominal and pelvic nodal metastasis.  Moderate response of abdominopelvic omental/peritoneal metastasis.  Similar to minimal decrease in size of a dominant left pelvic mass.  New mild left-sided hydroureteronephrosis likely secondary to the dominant mass. Cycle 4 Taxol /carboplatin  08/04/2023, Udenyca  09/01/2023-diagnostic laparoscopy, BSO, omentectomy, rectosigmoid resection, pelvic sidewall biopsy, bladder peritoneum biopsy, sigmoid nodule, small bowel mesenteric nodule resection.  High-grade carcinoma with psammoma calcifications and fibrosis involving the omentum, right fallopian tube and right ovary, left ovary, rectosigmoid colon, 4/20 lymph nodes positive, fibrosis with psammoma calcifications at the pelvic sidewall and bladder peritoneum biopsies, high-grade carcinoma involving the sigmoid colon nodule, fibrotic nodule with numerous psammoma calcifications at the small bowel mesentery nodule; high-grade serous carcinoma, FIGO grade 3, chemotherapy response score  2,[id number] Foundation One: MSI  stable, HRDsig negative TMB 4 Muts/Mb, BRCA1 rearrangement exon 10 (Germline testing BRCA negative 06/18/23) Cycle 5 Taxol /Carboplatin  10/20/2023, Udenyca  Cycle 6 Taxol /carboplatin  11/10/2023, Udenyca  Cycle 7 Taxol /carboplatin  12/01/2023 12/24/2023 olaparib CTs 12/29/2023: Right pericardiophrenic node measures 0.6 m compared to 1 cm 09/23/2023, no pathologically enlarged chest nodes, no evidence of residual or recurrent disease   Hypertension 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 difficile colitis 09/16/2023; vanc taper starting 10/13/23, completed 10/29/2023        Disposition: Debra Nguyen has a history of advanced stage fallopian tube carcinoma.  She completed neoadjuvant chemotherapy, cytoreductive surgery, and adjuvant chemotherapy.  She is now taking maintenance olaparib.  I reviewed the restaging CT findings and images with her.  We reviewed the pretreatment images and compared to the current study.  There are a few tiny remaining lymph nodes in the chest, and possibly the abdomen.  There has been marked radiologic improvement.  The CA125 is normal.  The plan is to continue olaparib maintenance.  She will return for a lab visit in 2 weeks and an office visit in 4 weeks.  [name], MD  01/03/2024  4:05 PM

## 2024-01-05 DIAGNOSIS — K6289 Other specified diseases of anus and rectum: Secondary | ICD-10-CM | POA: Diagnosis not present

## 2024-01-05 DIAGNOSIS — K640 First degree hemorrhoids: Secondary | ICD-10-CM | POA: Diagnosis not present

## 2024-01-06 ENCOUNTER — Encounter: Payer: Self-pay | Admitting: Oncology

## 2024-01-07 ENCOUNTER — Other Ambulatory Visit: Payer: Self-pay

## 2024-01-11 ENCOUNTER — Other Ambulatory Visit: Payer: Self-pay

## 2024-01-11 ENCOUNTER — Other Ambulatory Visit (HOSPITAL_COMMUNITY): Payer: Self-pay

## 2024-01-11 ENCOUNTER — Other Ambulatory Visit: Payer: Self-pay | Admitting: Oncology

## 2024-01-11 MED ORDER — OLAPARIB 150 MG PO TABS
300.0000 mg | ORAL_TABLET | Freq: Two times a day (BID) | ORAL | 0 refills | Status: DC
  Filled 2024-01-11: qty 120, 30d supply, fill #0

## 2024-01-11 NOTE — Progress Notes (Signed)
 Specialty Pharmacy Refill Coordination Note  Debra Nguyen is a 68 y.o. female contacted today regarding refills of specialty medication(s) Olaparib  (LYNPARZA )   Patient requested Delivery   Delivery date: 01/20/24   Verified address: [address] [place] [address]   Medication will be filled on: 01/19/24  This fill date is pending response to refill request from provider. Patient is aware and if they have not received fill by intended date they must follow up with pharmacy.

## 2024-01-11 NOTE — Progress Notes (Signed)
 Specialty Pharmacy Ongoing Clinical Assessment Note  Debra Nguyen is a 68 y.o. female who is being followed by the specialty pharmacy service for RxSp Oncology   Patient's specialty medication(s) reviewed today: Olaparib  (LYNPARZA )   Missed doses in the last 4 weeks: 0   Patient/Caregiver did not have any additional questions or concerns.   Therapeutic benefit summary: Patient is achieving benefit   Adverse events/side effects summary: Experienced adverse events/side effects (fatigue, tolerable)   Patient's therapy is appropriate to: Continue    Goals Addressed             This Visit's Progress    Achieve or maintain remission   No change    Patient is initiating therapy. Patient will maintain adherence         Follow up: 3 months  Silvano LOISE Dolly Specialty Pharmacist

## 2024-01-15 ENCOUNTER — Other Ambulatory Visit (HOSPITAL_BASED_OUTPATIENT_CLINIC_OR_DEPARTMENT_OTHER): Payer: Self-pay

## 2024-01-17 ENCOUNTER — Other Ambulatory Visit: Payer: Self-pay

## 2024-01-17 ENCOUNTER — Other Ambulatory Visit (HOSPITAL_BASED_OUTPATIENT_CLINIC_OR_DEPARTMENT_OTHER): Payer: Self-pay

## 2024-01-17 MED ORDER — HYDROCHLOROTHIAZIDE 12.5 MG PO TABS
12.5000 mg | ORAL_TABLET | Freq: Every morning | ORAL | 1 refills | Status: AC
  Filled 2024-01-17: qty 90, 90d supply, fill #0

## 2024-01-18 DIAGNOSIS — D1801 Hemangioma of skin and subcutaneous tissue: Secondary | ICD-10-CM | POA: Diagnosis not present

## 2024-01-18 DIAGNOSIS — D485 Neoplasm of uncertain behavior of skin: Secondary | ICD-10-CM | POA: Diagnosis not present

## 2024-01-18 DIAGNOSIS — D225 Melanocytic nevi of trunk: Secondary | ICD-10-CM | POA: Diagnosis not present

## 2024-01-18 DIAGNOSIS — L570 Actinic keratosis: Secondary | ICD-10-CM | POA: Diagnosis not present

## 2024-01-18 DIAGNOSIS — L814 Other melanin hyperpigmentation: Secondary | ICD-10-CM | POA: Diagnosis not present

## 2024-01-18 DIAGNOSIS — L821 Other seborrheic keratosis: Secondary | ICD-10-CM | POA: Diagnosis not present

## 2024-01-19 ENCOUNTER — Other Ambulatory Visit: Payer: Self-pay

## 2024-01-20 ENCOUNTER — Ambulatory Visit: Payer: Self-pay | Admitting: Oncology

## 2024-01-20 ENCOUNTER — Inpatient Hospital Stay: Attending: Nurse Practitioner

## 2024-01-20 DIAGNOSIS — N1330 Unspecified hydronephrosis: Secondary | ICD-10-CM | POA: Diagnosis not present

## 2024-01-20 DIAGNOSIS — I10 Essential (primary) hypertension: Secondary | ICD-10-CM | POA: Diagnosis not present

## 2024-01-20 DIAGNOSIS — C19 Malignant neoplasm of rectosigmoid junction: Secondary | ICD-10-CM | POA: Diagnosis present

## 2024-01-20 DIAGNOSIS — C5701 Malignant neoplasm of right fallopian tube: Secondary | ICD-10-CM | POA: Diagnosis present

## 2024-01-20 DIAGNOSIS — C786 Secondary malignant neoplasm of retroperitoneum and peritoneum: Secondary | ICD-10-CM | POA: Insufficient documentation

## 2024-01-20 DIAGNOSIS — C569 Malignant neoplasm of unspecified ovary: Secondary | ICD-10-CM

## 2024-01-20 LAB — CBC WITH DIFFERENTIAL (CANCER CENTER ONLY)
Abs Immature Granulocytes: 0.01 K/uL (ref 0.00–0.07)
Basophils Absolute: 0 K/uL (ref 0.0–0.1)
Basophils Relative: 1 %
Eosinophils Absolute: 0.1 K/uL (ref 0.0–0.5)
Eosinophils Relative: 3 %
HCT: 33.7 % — ABNORMAL LOW (ref 36.0–46.0)
Hemoglobin: 11.8 g/dL — ABNORMAL LOW (ref 12.0–15.0)
Immature Granulocytes: 0 %
Lymphocytes Relative: 32 %
Lymphs Abs: 0.9 K/uL (ref 0.7–4.0)
MCH: 32.7 pg (ref 26.0–34.0)
MCHC: 35 g/dL (ref 30.0–36.0)
MCV: 93.4 fL (ref 80.0–100.0)
Monocytes Absolute: 0.2 K/uL (ref 0.1–1.0)
Monocytes Relative: 9 %
Neutro Abs: 1.4 K/uL — ABNORMAL LOW (ref 1.7–7.7)
Neutrophils Relative %: 55 %
Platelet Count: 202 K/uL (ref 150–400)
RBC: 3.61 MIL/uL — ABNORMAL LOW (ref 3.87–5.11)
RDW: 18.2 % — ABNORMAL HIGH (ref 11.5–15.5)
WBC Count: 2.6 K/uL — ABNORMAL LOW (ref 4.0–10.5)
nRBC: 0 % (ref 0.0–0.2)

## 2024-01-20 LAB — CMP (CANCER CENTER ONLY)
ALT: 30 U/L (ref 0–44)
AST: 31 U/L (ref 15–41)
Albumin: 4.2 g/dL (ref 3.5–5.0)
Alkaline Phosphatase: 88 U/L (ref 38–126)
Anion gap: 10 (ref 5–15)
BUN: 9 mg/dL (ref 8–23)
CO2: 28 mmol/L (ref 22–32)
Calcium: 9.6 mg/dL (ref 8.9–10.3)
Chloride: 103 mmol/L (ref 98–111)
Creatinine: 0.69 mg/dL (ref 0.44–1.00)
GFR, Estimated: >60 mL/min (ref >60)
Glucose, Bld: 90 mg/dL (ref 70–99)
Potassium: 3.7 mmol/L (ref 3.5–5.1)
Sodium: 141 mmol/L (ref 135–145)
Total Bilirubin: 0.8 mg/dL (ref 0.0–1.2)
Total Protein: 6.5 g/dL (ref 6.5–8.1)

## 2024-01-20 LAB — LIPID PANEL
Cholesterol: 169 mg/dL (ref 0–200)
HDL: 67 mg/dL (ref >40)
LDL Cholesterol: 68 mg/dL (ref 0–99)
Total CHOL/HDL Ratio: 2.5 ratio
Triglycerides: 169 mg/dL — ABNORMAL HIGH (ref <150)
VLDL: 34 mg/dL (ref 0–40)

## 2024-01-20 NOTE — Progress Notes (Signed)
 Patients port flushed without difficulty.  Good blood return noted with no bruising or swelling noted at site.  Gauze dressing applied.  VSS with discharge and left in satisfactory condition with no s/s of distress noted.

## 2024-01-20 NOTE — Patient Instructions (Signed)
 [facility] - A DEPT OF [facility]  Discharge Instructions: Thank you for choosing [facility] to provide your oncology and hematology care.   If you have a lab appointment with the Cancer Center, please go directly to the Cancer Center and check in at the registration area.   Wear comfortable clothing and clothing appropriate for easy access to any Portacath or PICC line.   We strive to give you quality time with your provider. You may need to reschedule your appointment if you arrive late (15 or more minutes).  Arriving late affects you and other patients whose appointments are after yours.  Also, if you miss three or more appointments without notifying the office, you may be dismissed from the clinic at the provider's discretion.      For prescription refill requests, have your pharmacy contact our office and allow 72 hours for refills to be completed.    Today you received the following port flush with labs.    To help prevent nausea and vomiting after your treatment, we encourage you to take your nausea medication as directed.  BELOW ARE SYMPTOMS THAT SHOULD BE REPORTED IMMEDIATELY: *FEVER GREATER THAN 100.4 F (38 C) OR HIGHER *CHILLS OR SWEATING *NAUSEA AND VOMITING THAT IS NOT CONTROLLED WITH YOUR NAUSEA MEDICATION *UNUSUAL SHORTNESS OF BREATH *UNUSUAL BRUISING OR BLEEDING *URINARY PROBLEMS (pain or burning when urinating, or frequent urination) *BOWEL PROBLEMS (unusual diarrhea, constipation, pain near the anus) TENDERNESS IN MOUTH AND THROAT WITH OR WITHOUT PRESENCE OF ULCERS (sore throat, sores in mouth, or a toothache) UNUSUAL RASH, SWELLING OR PAIN  UNUSUAL VAGINAL DISCHARGE OR ITCHING   Items with * indicate a potential emergency and should be followed up as soon as possible or go to the Emergency Department if any problems should occur.  Please show the CHEMOTHERAPY ALERT CARD or IMMUNOTHERAPY ALERT CARD at check-in to the  Emergency Department and triage nurse.  Should you have questions after your visit or need to cancel or reschedule your appointment, please contact [facility] - A DEPT OF [facility]  Dept: [phone]  and follow the prompts.  Office hours are 8:00 a.m. to 4:30 p.m. Monday - Friday. Please note that voicemails left after 4:00 p.m. may not be returned until the following business day.  We are closed weekends and major holidays. You have access to a nurse at all times for urgent questions. Please call the main number to the clinic Dept: [phone] and follow the prompts.   For any non-urgent questions, you may also contact your provider using MyChart. We now offer e-Visits for anyone 60 and older to request care online for non-urgent symptoms. For details visit [email or web address].   Also download the MyChart app! Go to the app store, search MyChart, open the app, select [place], and log in with your MyChart username and password.

## 2024-01-21 ENCOUNTER — Encounter: Payer: Self-pay | Admitting: Oncology

## 2024-01-24 NOTE — Telephone Encounter (Signed)
Forwarded message to MD

## 2024-01-25 DIAGNOSIS — Z1331 Encounter for screening for depression: Secondary | ICD-10-CM | POA: Diagnosis not present

## 2024-01-25 DIAGNOSIS — I10 Essential (primary) hypertension: Secondary | ICD-10-CM | POA: Diagnosis not present

## 2024-01-25 DIAGNOSIS — C549 Malignant neoplasm of corpus uteri, unspecified: Secondary | ICD-10-CM | POA: Diagnosis not present

## 2024-01-25 DIAGNOSIS — F419 Anxiety disorder, unspecified: Secondary | ICD-10-CM | POA: Diagnosis not present

## 2024-01-25 DIAGNOSIS — R5383 Other fatigue: Secondary | ICD-10-CM | POA: Diagnosis not present

## 2024-01-25 DIAGNOSIS — I2510 Atherosclerotic heart disease of native coronary artery without angina pectoris: Secondary | ICD-10-CM | POA: Diagnosis not present

## 2024-01-25 DIAGNOSIS — E559 Vitamin D deficiency, unspecified: Secondary | ICD-10-CM | POA: Diagnosis not present

## 2024-01-25 DIAGNOSIS — I700 Atherosclerosis of aorta: Secondary | ICD-10-CM | POA: Diagnosis not present

## 2024-01-25 DIAGNOSIS — E7800 Pure hypercholesterolemia, unspecified: Secondary | ICD-10-CM | POA: Diagnosis not present

## 2024-01-25 DIAGNOSIS — Z0000 Encounter for general adult medical examination without abnormal findings: Secondary | ICD-10-CM | POA: Diagnosis not present

## 2024-01-25 DIAGNOSIS — M8580 Other specified disorders of bone density and structure, unspecified site: Secondary | ICD-10-CM | POA: Diagnosis not present

## 2024-01-25 DIAGNOSIS — K219 Gastro-esophageal reflux disease without esophagitis: Secondary | ICD-10-CM | POA: Diagnosis not present

## 2024-01-30 ENCOUNTER — Encounter: Payer: Self-pay | Admitting: Oncology

## 2024-02-03 ENCOUNTER — Inpatient Hospital Stay

## 2024-02-03 ENCOUNTER — Encounter: Payer: Self-pay | Admitting: Nurse Practitioner

## 2024-02-03 ENCOUNTER — Inpatient Hospital Stay: Admitting: Nurse Practitioner

## 2024-02-03 VITALS — BP 147/92 | HR 88 | Temp 97.9°F | Resp 18 | Ht 67.0 in | Wt 134.2 lb

## 2024-02-03 DIAGNOSIS — C569 Malignant neoplasm of unspecified ovary: Secondary | ICD-10-CM

## 2024-02-03 DIAGNOSIS — C579 Malignant neoplasm of female genital organ, unspecified: Secondary | ICD-10-CM

## 2024-02-03 DIAGNOSIS — C19 Malignant neoplasm of rectosigmoid junction: Secondary | ICD-10-CM | POA: Diagnosis not present

## 2024-02-03 LAB — CMP (CANCER CENTER ONLY)
ALT: 45 U/L — ABNORMAL HIGH (ref 0–44)
AST: 40 U/L (ref 15–41)
Albumin: 4.4 g/dL (ref 3.5–5.0)
Alkaline Phosphatase: 102 U/L (ref 38–126)
Anion gap: 11 (ref 5–15)
BUN: 11 mg/dL (ref 8–23)
CO2: 27 mmol/L (ref 22–32)
Calcium: 9.8 mg/dL (ref 8.9–10.3)
Chloride: 101 mmol/L (ref 98–111)
Creatinine: 0.7 mg/dL (ref 0.44–1.00)
GFR, Estimated: >60 mL/min (ref >60)
Glucose, Bld: 102 mg/dL — ABNORMAL HIGH (ref 70–99)
Potassium: 3.7 mmol/L (ref 3.5–5.1)
Sodium: 139 mmol/L (ref 135–145)
Total Bilirubin: 1.2 mg/dL (ref 0.0–1.2)
Total Protein: 6.7 g/dL (ref 6.5–8.1)

## 2024-02-03 LAB — CBC WITH DIFFERENTIAL (CANCER CENTER ONLY)
Abs Immature Granulocytes: 0.01 K/uL (ref 0.00–0.07)
Basophils Absolute: 0 K/uL (ref 0.0–0.1)
Basophils Relative: 1 %
Eosinophils Absolute: 0.1 K/uL (ref 0.0–0.5)
Eosinophils Relative: 2 %
HCT: 36.4 % (ref 36.0–46.0)
Hemoglobin: 12.5 g/dL (ref 12.0–15.0)
Immature Granulocytes: 0 %
Lymphocytes Relative: 29 %
Lymphs Abs: 1.3 K/uL (ref 0.7–4.0)
MCH: 33.3 pg (ref 26.0–34.0)
MCHC: 34.3 g/dL (ref 30.0–36.0)
MCV: 97.1 fL (ref 80.0–100.0)
Monocytes Absolute: 0.3 K/uL (ref 0.1–1.0)
Monocytes Relative: 8 %
Neutro Abs: 2.6 K/uL (ref 1.7–7.7)
Neutrophils Relative %: 60 %
Platelet Count: 221 K/uL (ref 150–400)
RBC: 3.75 MIL/uL — ABNORMAL LOW (ref 3.87–5.11)
RDW: 19 % — ABNORMAL HIGH (ref 11.5–15.5)
WBC Count: 4.3 K/uL (ref 4.0–10.5)
nRBC: 0 % (ref 0.0–0.2)

## 2024-02-03 NOTE — Progress Notes (Signed)
 Patients port flushed without difficulty.  Good blood return noted with no bruising or swelling noted at site.  Gauze dressing applied. Patient to see Olam Ned NP today.

## 2024-02-03 NOTE — Progress Notes (Signed)
 [facility] OFFICE PROGRESS NOTE   Diagnosis: Fallopian tube carcinoma  INTERVAL HISTORY:   Ms. Glassco returns as scheduled.  She continues olaparib .  She denies nausea/vomiting.  No mouth sores.  No diarrhea.  No rash.  No cough or shortness of breath.  Over the past several weeks she has noted discomfort at the upper back, lower back, wrist, knees and hips.  She mainly notices the pain at nighttime.  She takes extra strength Tylenol  with fairly good relief.  Symptoms do not interfere with activity.  She denies red or swollen joints.  Objective:  Vital signs in last 24 hours:  Blood pressure (!) 147/92, pulse 88, temperature 97.9 F (36.6 C), temperature source Temporal, resp. rate 18, height 5' 7 (1.702 m), weight 134 lb 3.2 oz (60.9 kg), last menstrual period 02/02/2011, SpO2 100%.    HEENT: No thrush or ulcers. Lymphatics: No palpable cervical, supraclavicular, axillary or inguinal lymph nodes. Resp: Lungs clear bilaterally. Cardio: Regular rate and rhythm. GI: No hepatosplenomegaly.  No mass.  Nontender. Vascular: No leg edema. Skin: No rash. Port-A-Cath without erythema.  Lab Results:  Lab Results  Component Value Date   WBC 4.3 02/03/2024   HGB 12.5 02/03/2024   HCT 36.4 02/03/2024   MCV 97.1 02/03/2024   PLT 221 02/03/2024   NEUTROABS 2.6 02/03/2024    Imaging:  No results found.  Medications: I have reviewed the patient's current medications.  Assessment/Plan: Fallopian tube carcinoma, right fallopian tube CT abdomen/pelvis 05/13/2023-5.0 cm apple core lesion in the upper/mid rectum.  Peritoneal disease/omental caking with multiple cystic metastases noted as well as abdominopelvic lymphadenopathy.  Colonoscopy 05/25/2023-infiltrative partially obstructing large mass in the rectosigmoid colon from 20 to 28 cm from the anal verge.  The mass was circumferential.  Oozing was present.  Biopsy showed poorly differentiated carcinoma consistent with a  mullerian primary, immunohistochemical staining favors high-grade serous carcinoma, CK7, PAX8 positive, weak ER positivity and 70% of cells, p16 positive, p53 mutant Foundation 1: HRD negative, microsatellite status and tumor mutation burden cannot be determined, K-ras amplification CT chest 05/28/2023: Abnormal mediastinal/left supraclavicular nodes, calcified lung nodules and punctate noncalcified left-sided lung nodules-nonspecific Cycle 1 Taxol /carboplatin  06/02/2023, Udenyca  06/11/2023 appointment with Dr. Monia 3 cycles of chemotherapy then repeat imaging, discuss if she is a candidate for debulking surgery Cycle 2 Taxol /carboplatin  06/23/2023, Udenyca  Cycle 3 Taxol /carboplatin  07/15/2023, Udenyca  CTs 08/02/2023-moderate to marked response to therapy of lower cervical, thoracic, abdominal and pelvic nodal metastasis.  Moderate response of abdominopelvic omental/peritoneal metastasis.  Similar to minimal decrease in size of a dominant left pelvic mass.  New mild left-sided hydroureteronephrosis likely secondary to the dominant mass. Cycle 4 Taxol /carboplatin  08/04/2023, Udenyca  09/01/2023-diagnostic laparoscopy, BSO, omentectomy, rectosigmoid resection, pelvic sidewall biopsy, bladder peritoneum biopsy, sigmoid nodule, small bowel mesenteric nodule resection.  High-grade carcinoma with psammoma calcifications and fibrosis involving the omentum, right fallopian tube and right ovary, left ovary, rectosigmoid colon, 4/20 lymph nodes positive, fibrosis with psammoma calcifications at the pelvic sidewall and bladder peritoneum biopsies, high-grade carcinoma involving the sigmoid colon nodule, fibrotic nodule with numerous psammoma calcifications at the small bowel mesentery nodule; high-grade serous carcinoma, FIGO grade 3, chemotherapy response score 2,[id number] Foundation One: MSI stable, HRDsig negative TMB 4 Muts/Mb, BRCA1 rearrangement exon 10 (Germline testing BRCA negative 06/18/23) Cycle 5  Taxol /Carboplatin  10/20/2023, Udenyca  Cycle 6 Taxol /carboplatin  11/10/2023, Udenyca  Cycle 7 Taxol /carboplatin  12/01/2023 12/24/2023 olaparib  CTs 12/29/2023: Right pericardiophrenic node measures 0.6 m compared to 1 cm 09/23/2023, no pathologically enlarged chest nodes, no evidence  of residual or recurrent disease; addendum issued 01/26/2024 stating no pathologically enlarged lymph nodes in the abdomen or pelvis (previously reported as new pathologically enlarged lymph nodes in the abdomen or pelvis on CT report 12/29/2023)   Hypertension 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 difficile colitis 09/16/2023; vanc taper starting 10/13/23, completed 10/29/2023  Disposition: Ms. Tagliaferri appears stable.  She continues olaparib .  There is no clinical evidence of disease progression.  Plan to continue the same.  She is experiencing mild arthralgias/myalgias.  She will treat symptomatically, understands to contact the office if symptoms worsen and/or begin to affect activity.  CBC and chemistry panel reviewed.  Labs adequate to continue olaparib .  We reviewed the CT images from 12/29/2023 and prior with Mrs. Andis at today's visit.  There is no definite adenopathy in the abdomen/pelvis and a right pericardiophrenic node is smaller on CT 12/29/2023.  She will return for lab, port flush and a follow-up visit in 6 weeks.  She will contact the office in the interim as outlined above or with any other problems.  Patient seen with [name].  Olam Ned ANP/GNP-BC   02/03/2024  12:08 PM  This was a shared visit with Olam Ned.  Ms. Clairmont continues olaparib  maintenance.  We reviewed the 12/29/2023 CT findings and images with her.  I cannot appreciate any adenopathy in the abdomen or pelvis, a previously noted chest node has decreased significantly in size.  She understands remaining lymph nodes may be related to treated metastatic disease.  They could represent scar  tissue or harbor  residual tumor.  We will follow lymph nodes with repeat imaging.  Arvella Cloretta, MD

## 2024-02-09 ENCOUNTER — Other Ambulatory Visit: Payer: Self-pay | Admitting: Oncology

## 2024-02-09 ENCOUNTER — Other Ambulatory Visit: Payer: Self-pay

## 2024-02-11 ENCOUNTER — Other Ambulatory Visit: Payer: Self-pay | Admitting: Oncology

## 2024-02-11 ENCOUNTER — Other Ambulatory Visit (HOSPITAL_COMMUNITY): Payer: Self-pay

## 2024-02-14 ENCOUNTER — Other Ambulatory Visit: Payer: Self-pay

## 2024-02-14 ENCOUNTER — Other Ambulatory Visit: Payer: Self-pay | Admitting: Oncology

## 2024-02-14 MED ORDER — OLAPARIB 150 MG PO TABS
300.0000 mg | ORAL_TABLET | Freq: Two times a day (BID) | ORAL | 1 refills | Status: AC
  Filled 2024-02-15: qty 120, 30d supply, fill #0
  Filled 2024-03-08 – 2024-03-09 (×2): qty 120, 30d supply, fill #1

## 2024-02-14 NOTE — Progress Notes (Signed)
 Specialty Pharmacy Refill Coordination Note  Debra Nguyen is a 69 y.o. female contacted today regarding refills of specialty medication(s) Olaparib  (LYNPARZA )   Patient requested Delivery   Delivery date: 02/16/24   Verified address: [address] [place], [place] [address]   Medication will be filled on: 02/15/24   This fill date is pending response to refill request from provider. Patient is aware and if they have not received fill by intended date they must follow up with pharmacy.

## 2024-02-15 ENCOUNTER — Other Ambulatory Visit: Payer: Self-pay

## 2024-02-21 ENCOUNTER — Encounter: Payer: Self-pay | Admitting: Oncology

## 2024-02-25 ENCOUNTER — Encounter: Payer: Self-pay | Admitting: Gynecologic Oncology

## 2024-02-25 ENCOUNTER — Inpatient Hospital Stay: Attending: Nurse Practitioner | Admitting: Gynecologic Oncology

## 2024-02-25 VITALS — BP 137/85 | HR 98 | Temp 97.7°F | Resp 19 | Wt 134.0 lb

## 2024-02-25 DIAGNOSIS — Z87411 Personal history of vaginal dysplasia: Secondary | ICD-10-CM

## 2024-02-25 DIAGNOSIS — Z1151 Encounter for screening for human papillomavirus (HPV): Secondary | ICD-10-CM | POA: Insufficient documentation

## 2024-02-25 DIAGNOSIS — Z1509 Genetic susceptibility to other malignant neoplasm: Secondary | ICD-10-CM | POA: Insufficient documentation

## 2024-02-25 DIAGNOSIS — Z1501 Genetic susceptibility to malignant neoplasm of breast: Secondary | ICD-10-CM | POA: Insufficient documentation

## 2024-02-25 DIAGNOSIS — Z9221 Personal history of antineoplastic chemotherapy: Secondary | ICD-10-CM | POA: Insufficient documentation

## 2024-02-25 DIAGNOSIS — Z124 Encounter for screening for malignant neoplasm of cervix: Secondary | ICD-10-CM | POA: Insufficient documentation

## 2024-02-25 DIAGNOSIS — Z9079 Acquired absence of other genital organ(s): Secondary | ICD-10-CM | POA: Insufficient documentation

## 2024-02-25 DIAGNOSIS — Z8544 Personal history of malignant neoplasm of other female genital organs: Secondary | ICD-10-CM | POA: Insufficient documentation

## 2024-02-25 DIAGNOSIS — Z90710 Acquired absence of both cervix and uterus: Secondary | ICD-10-CM | POA: Insufficient documentation

## 2024-02-25 DIAGNOSIS — C786 Secondary malignant neoplasm of retroperitoneum and peritoneum: Secondary | ICD-10-CM | POA: Diagnosis not present

## 2024-02-25 DIAGNOSIS — C579 Malignant neoplasm of female genital organ, unspecified: Secondary | ICD-10-CM

## 2024-02-25 DIAGNOSIS — Z1502 Genetic susceptibility to malignant neoplasm of ovary: Secondary | ICD-10-CM | POA: Insufficient documentation

## 2024-02-25 DIAGNOSIS — Z79899 Other long term (current) drug therapy: Secondary | ICD-10-CM | POA: Insufficient documentation

## 2024-02-25 DIAGNOSIS — Z90722 Acquired absence of ovaries, bilateral: Secondary | ICD-10-CM | POA: Insufficient documentation

## 2024-02-25 DIAGNOSIS — K5900 Constipation, unspecified: Secondary | ICD-10-CM | POA: Insufficient documentation

## 2024-02-25 NOTE — Progress Notes (Signed)
 Gynecologic Oncology Return Clinic Visit  02/25/2024  Reason for Visit: surveillance  Treatment History: Oncology History  Gynecologic cancer [facility])  05/27/2023 Cancer Staging   Staging form: Ovary, Fallopian Tube, and Primary Peritoneal Carcinoma, AJCC 8th Edition - Clinical: FIGO Stage IV (cTX, cN1, cM1) - Signed by Cloretta Arley NOVAK, MD on 05/27/2023 Stage prefix: Initial diagnosis   06/02/2023 -  Chemotherapy   Patient is on Treatment Plan : OVARIAN Carboplatin  (AUC 6) + Paclitaxel  (175) q21d X 6 Cycles     Fallopian tube cancer, carcinoma (HCC)  05/27/2023 Initial Diagnosis   Gynecologic cancer (HCC)   07/15/2023 Genetic Testing   Negative genetic testing on the CancerNext-Expanded+RNAinsight panel.  The report date is Jul 14, 2023.  The CancerNext-Expanded gene panel offered by [facility] and includes sequencing, rearrangement, and RNA analysis for the following 77 genes: AIP, ALK, APC, ATM, BAP1, BARD1, BMPR1A, BRCA1, BRCA2, BRIP1, CDC73, CDH1, CDK4, CDKN1B, CDKN2A, CEBPA, CHEK2, CTNNA1, DDX41, DICER1, ETV6, FH, FLCN, GATA2, LZTR1, MAX, MBD4, MEN1, MET, MLH1, MSH2, MSH3, MSH6, MUTYH, NF1, NF2, NTHL1, PALB2, PHOX2B, PMS2, POT1, PRKAR1A, PTCH1, PTEN, RAD51C, RAD51D, RB1, RET, RPS20, RUNX1, SDHA, SDHAF2, SDHB, SDHC, SDHD, SMAD4, SMARCA4, SMARCB1, SMARCE1, STK11, SUFU, TMEM127, TP53, TSC1, TSC2, VHL, and WT1 (sequencing and deletion/duplication); AXIN2, CTNNA1, DDX41, EGFR, HOXB13, KIT, MBD4, MITF, MSH3, PDGFRA, POLD1 and POLE (sequencing only); EPCAM and GREM1 (deletion/duplication only). RNA data is routinely analyzed for use in variant interpretation for all genes.    09/01/2023 Surgery   Diagnostic laparoscopy, conversion to exploratory laparotomy with radical tumor debulking including total omentectomy, right salpingo-oophorectomy, en bloc resection of left tube and ovary with rectosigmoid colon with rectosigmoid reanastomosis, stripping of bladder peritoneum, resection of right deep  pelvic nodule, excision of implant and small bowel mesenteric implant   Findings: On diagnostic laparoscopy, normal upper abdominal survey including diaphragm, liver edge.  Omental cake adherent with filmy adhesions to the anterior abdominal wall in the mid abdomen on the left.  Otherwise, small bowel and small bowel mesentery appears normal.  Within the pelvis, sigmoid colon draped over large left-sided pelvic mass.  On laparotomy, diaphragm and liver surfaces smooth on palpation.  Stomach normal in appearance.  Several smaller areas of omental cake with treatment effect noted within the infracolic omentum, few thickened areas within the infra gastric omentum.  Lesser sac without evidence of disease.  Normal spleen.  No ascites.  Small implant on mesentery of the small bowel, implant on the descending colon.  Within the pelvis, the sigmoid colon was draped over the left adnexa that had a multiloculated cystic lesion measuring approximately 10 cm that was partially retroperitonealized and adherent to the left ureter with retroperitoneal fibrosis noted.  On the right, normal-appearing fallopian tube and ovary.  Some fibrosis also within the right retroperitoneum.  The colon itself was somewhat redundant within the pelvis and after it was freed from the left sidewall where it was draped over the left tube and ovary, it was adherent in multiple places to the left adnexa and to a cystic lesion within the right mesorectum.  Given findings, decision made to proceed with rectosigmoid resection en bloc with the left tube and ovary.  Ultimately, given adherence of the cystic lesion to the right lateral aspect of the rectum, the rectum was taken above the peritoneal reflection but approximately 11 cm from the anal verge.  Small amount of of miliary disease noted on the bladder peritoneum, which was stripped.  There was some thickening along the deep  pelvic sidewall inferior to the right ureter that was subsequently  resected.  No palpable adenopathy.  Bubble test was negative, good perfusion to colon after ICG administration. At the end of surgery, no visible or palpable disease.    09/01/2023 Pathology Results   A. OMENTUM, RESECTION: Foci of high-grade carcinoma associated with numerous some psammomatous calcifications and extensive fibrosis consistent with treatment response. Largest grossly measured nodule is 2.3 cm.  B. FALLOPIAN TUBE AND OVARY, RIGHT: High-grade serous carcinoma involving right fallopian tube and right ovary consistent with fallopian tube primary. Serous tubal intraepithelial carcinoma (STIC) adjacent to invasive carcinoma. See oncology table and comment.  C.  LEFT FALLOPIAN TUBE AND OVARY WITH RECTOSIGMOID COLON, RESECTION: High-grade carcinoma involving colon and left ovary associated with numerous psammomatous calcifications and fibrosis consistent with treatment response. Metastatic carcinoma in four of twenty lymph nodes (4/20).  D. RIGHT DEEP PELVIC SIDEWALL, BIOPSY: Fibrosis with a few psammomatous calcifications and rare atypical cells.  E. PROXIMAL DONUT: Benign colon.  F. DISTAL DONUT: Benign colon.  G. BLADDER PERITONEUM, BIOPSY: Fibrosis with a few psammomatous calcifications and mild inflammation consistent with treatment response. No residual viable tumor cells identified.  H. SIGMOID NODULE: High-grade carcinoma associated with numerous psammomatous calcifications and fibrosis consistent with treatment response.  I. SMALL BOWEL MESENTERIC NODULE: Fibrotic nodule with numerous psammomatous calcifications consistent with treatment response. Negative for residual viable tumor cells.  ONCOLOGY TABLE: FALLOPIAN TUBE: Resection Procedure: Diagnostic laparoscopy, bilateral salpingo-oophorectomy with debulking including bowel surgery Specimen Integrity: Intact Tumor Site: Right fallopian tube, see comment Tumor Size: 2 cm, see comment Histologic  Type: High-grade serous carcinoma Histologic Grade: FIGO grade 3 Ovarian Surface Involvement: Present, bilateral Fallopian Tube Surface Involvement: Present Implants: Present Lymphatic and/or Vascular Invasion: Present Other Tissue/ Organ Involvement: Omentum, rectosigmoid colon, left ovary, pelvic sidewall, bladder peritoneum, sigmoid nodule and small bowel mesenteric nodule Largest Extrapelvic Peritoneal Focus: 2.3 cm, omentum Peritoneal/Ascitic Fluid Involvement: Not applicable Chemotherapy Response Score (CRS): CRS 2 Regional Lymph Nodes:      Number of Nodes with Metastasis Greater than 10 mm: 1      Number of Nodes with Metastasis 10 mm or Less (excludes isolated tumor cells): 3      Number of Nodes with Isolated Tumor Cells (0.2 mm or less): 0      Number of Lymph Nodes Examined: 20 Pathologic Stage Classification (pTNM, AJCC 8th Edition): ypT3c, ypN1b, see comment Ancillary Studies: Can be performed if requested Representative Tumor Block: B1, C4 and C7 Comment(s): The nodules of residual carcinoma are associated with numerous psammomatous calcifications and fibrosis consistent with treatment response.  The right fallopian tube and right ovary are involved by invasive high-grade carcinoma and the right fallopian tube has an adjacent focus of serous tubal intraepithelial carcinoma (STIC) and therefore tumor is staged as a right fallopian tube primary.  The greatest tumor dimension in the right fallopian tube is estimated from the histologic slides and 2 cm.     Completed C7 C/T 11/2023, started Olaparib  12/24/23  Interval History: Doing very well.  Denies any abdominal pain.  Good appetite.  Has occasional constipation for which she uses MiraLAX .  Has some bone and muscle pain as well as fatigue related to Olaparib .  Walking a mile and a half a day.  Denies any vaginal bleeding.  Past Medical/Surgical History: Past Medical History:  Diagnosis Date   Allergy    Anxiety     Through divorce   Arthritis    ASCUS with positive high  risk HPV 12/2006   Negative Colpo BX   ASCUS with positive high risk HPV 2009   Chronic kidney disease    kidney stones   CIN III (cervical intraepithelial neoplasia III) 07/1983   tx'd w/cryo- no subsequent abnormal paps until 2008   Essential hypertension    Family history of colon cancer    Fibroid 2006   3 cm Right Fundal Fibroid   Genetic testing 07/15/2023   Negative genetic testing on the CancerNext-Expanded+RNAinsight panel.  The report date is Jul 14, 2023.  The CancerNext-Expanded gene panel offered by [facility] and includes sequencing, rearrangement, and RNA analysis for the following 77 genes: AIP, ALK, APC, ATM, BAP1, BARD1, BMPR1A, BRCA1, BRCA2, BRIP1, CDC73, CDH1, CDK4, CDKN1B, CDKN2A, CEBPA, CHEK2, CTNNA1, DDX41, DICER1, ETV6, FH, FL*   GERD (gastroesophageal reflux disease) 2011   History of kidney stones    Hypertension    Osteopenia 2020   Synovial cyst of lumbar spine 10/2014   Vitamin D  deficiency     Past Surgical History:  Procedure Laterality Date   ABDOMINAL HYSTERECTOMY  02/23/2011   R TLH-fibroids, adenomyosis 221 g, & focal CIN I w/clear margins   ANTERIOR CERVICAL DECOMP/DISCECTOMY FUSION N/A 07/19/2012   Procedure: ANTERIOR CERVICAL DECOMPRESSION/DISCECTOMY FUSION 2 LEVELS;  Surgeon: Fairy Levels, MD;  Location: [facility];  Service: Neurosurgery;  Laterality: N/A;  Anterior Cervical Five-Six Cervical Six-Seven Anterior Cervical Decompression/Diskectomy/Fusion   cataracts Bilateral 03/2022   COLONOSCOPY     CYSTOSCOPY  02/23/2011   PT.STATES SHE DID NOT HAVE   dental implants     IR IMAGING GUIDED PORT INSERTION  05/31/2023   LAPAROSCOPY N/A 09/01/2023   Procedure: LAPAROSCOPY, DIAGNOSTIC;  Surgeon: Viktoria Comer SAUNDERS, MD;  Location: [facility];  Service: Gynecology;  Laterality: N/A;  DIAGNOSTIC LAPAROSCOPY, OPEN BSO, DEBULKING INCLUDING BOWEL SURGERY   LASIK  '99   LUMBAR  LAMINECTOMY/DECOMPRESSION MICRODISCECTOMY Right 02/05/2015   Procedure: Right Lumbar four-five Laminectomy with Resection of Synovial Cyst ;  Surgeon: Fairy Levels, MD;  Location: [facility];  Service: Neurosurgery;  Laterality: Right;   MOHS SURGERY     x 2   MOLE REMOVAL  2023   REPLACEMENT TOTAL KNEE Right 09/2021   SALPINGOOPHORECTOMY N/A 09/01/2023   Procedure: BILATERAL SALPINGO-OOPHORECTOMY, OPEN, EX LAP WITH RADICAL TUMOR DEBULKING, OMENTECTOMY, RECTOSIGMOID RESECTION AND ANASTOMOSIS;  Surgeon: Viktoria Comer SAUNDERS, MD;  Location: [facility];  Service: Gynecology;  Laterality: N/A;   TONSILLECTOMY AND ADENOIDECTOMY     VAGINAL DELIVERY  '92 '94    Family History  Problem Relation Age of Onset   Hypertension Mother    Heart disease Mother    Lung cancer Mother    Hypertension Father    Heart attack Father    Colon cancer Neg Hx    Esophageal cancer Neg Hx    Pancreatic cancer Neg Hx    Prostate cancer Neg Hx    Rectal cancer Neg Hx    Stomach cancer Neg Hx    Breast cancer Neg Hx     Social History   Socioeconomic History   Marital status: Divorced    Spouse name: Not on file   Number of children: 2   Years of education: Not on file   Highest education level: Not on file  Occupational History   Occupation: [employer]    Employer: [employer]  Tobacco Use   Smoking status: Never   Smokeless tobacco: Never  Vaping Use   Vaping status: Never Used  Substance and Sexual Activity   Alcohol use: Yes    Alcohol/week: 10.0 - 12.0 standard drinks of alcohol    Types: 10 - 12 Glasses of wine per week    Comment: 10-12 glasses of wine a week   Drug use: No   Sexual activity: Not Currently    Partners: Male    Birth control/protection: Post-menopausal, Surgical    Comment: R-TLH  Other Topics Concern   Not on file  Social History Narrative   Not on file   Social Drivers of Health   Tobacco Use: Low Risk (02/25/2024)   Patient History    Smoking Tobacco Use: Never     Smokeless Tobacco Use: Never    Passive Exposure: Not on file  Financial Resource Strain: Not on file  Food Insecurity: No Food Insecurity (09/01/2023)   Epic    Worried About Programme Researcher, Broadcasting/film/video in the Last Year: Never true    Ran Out of Food in the Last Year: Never true  Transportation Needs: No Transportation Needs (09/01/2023)   Epic    Lack of Transportation (Medical): No    Lack of Transportation (Non-Medical): No  Physical Activity: Not on file  Stress: Not on file  Social Connections: Unknown (09/01/2023)   Social Connection and Isolation Panel    Frequency of Communication with Friends and Family: More than three times a week    Frequency of Social Gatherings with Friends and Family: Twice a week    Attends Religious Services: More than 4 times per year    Active Member of [employer] or Organizations: Yes    Attends Banker Meetings: 1 to 4 times per year    Marital Status: Patient declined  Depression (PHQ2-9): Low Risk (02/03/2024)   Depression (PHQ2-9)    PHQ-2 Score: 0  Alcohol Screen: Not on file  Housing: Low Risk (09/01/2023)   Epic    Unable to Pay for Housing in the Last Year: No    Number of Times Moved in the Last Year: 0    Homeless in the Last Year: No  Utilities: Not At Risk (09/01/2023)   Epic    Threatened with loss of utilities: No  Health Literacy: Not on file    Current Medications: Current Medications[1]  Review of Systems: + Joint and back pain, muscle pain Denies appetite changes, fevers, chills, fatigue, unexplained weight changes. Denies hearing loss, neck lumps or masses, mouth sores, ringing in ears or voice changes. Denies cough or wheezing.  Denies shortness of breath. Denies chest pain or palpitations. Denies leg swelling. Denies abdominal distention, pain, blood in stools, constipation, diarrhea, nausea, vomiting, or early satiety. Denies pain with intercourse, dysuria, frequency, hematuria or incontinence. Denies hot flashes,  pelvic pain, vaginal bleeding or vaginal discharge.   Denies itching, rash, or wounds. Denies dizziness, headaches, numbness or seizures. Denies swollen lymph nodes or glands, denies easy bruising or bleeding. Denies anxiety, depression, confusion, or decreased concentration.  Physical Exam: BP 137/85 (BP Location: Left Arm, Patient Position: Sitting)   Pulse 98   Temp 97.7 F (36.5 C) (Oral)   Resp 19   Wt 134 lb (60.8 kg)   LMP 02/02/2011   SpO2 100%   BMI 20.99 kg/m  General: Alert, oriented, no acute distress. HEENT: Posterior oropharynx clear, sclera anicteric. Chest: Clear to auscultation bilaterally.  Port site clean. Cardiovascular: Regular rate and rhythm, no murmurs. Abdomen: soft, nontender.  Normoactive bowel sounds.  No masses or hepatosplenomegaly appreciated.  Well-healed scar.  Extremities: Grossly normal range of motion.  Warm, well perfused.  No edema bilaterally. Skin: No rashes or lesions noted. Lymphatics: No cervical, supraclavicular, or inguinal adenopathy. GU: Normal appearing external genitalia without erythema, excoriation, or lesions.  Speculum exam reveals mildly thick vaginal mucosa, no lesions.  Pap and HPV collected.  Bimanual exam reveals cuff intact without masses or nodularity.  Rectovaginal exam  confirms these findings.  Laboratory & Radiologic Studies:          Component Ref Range & Units (hover) 2 mo ago (12/22/23) 2 mo ago (12/01/23) 4 mo ago (10/20/23) 5 mo ago (09/22/23) 6 mo ago (08/23/23) 6 mo ago (08/04/23) 7 mo ago (07/15/23)  Cancer Antigen (CA) 125 8.6 8.5 CM 7.8 CM 12.9 CM 53.3 High  CM 109.0 High  CM 420.0 High  CM     12/29/23: CT C/A/P 1. No evidence of residual or recurrent metastatic disease in the chest, abdomen, and pelvis. 2. Expected postsurgical changes from partial distal colectomy with intact colorectal anastomosis. No leak or fluid collection. 3. Aortic Atherosclerosis (ICD10-I70.0).  Assessment & Plan: Debra Nguyen is a 69 y.o. 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 Now on PARPi. Germline: negative Somatic: BRCA1 mutation, HRD negative  Patient is doing well, NED on exam.  Tolerating PARPi with manageable side effects.    Given her history of high-grade vaginal dysplasia, Pap and HPV collected today.  I will contact her with these results.  Per NCCN recommendations, we will continue with surveillance visits every 3 months. She sees Dr. Andriette team in February. I will see her back in May.  We discussed tentative plan for CT imaging 6 months after her last scan.  I reviewed signs and symptoms that should prompt a phone call before her next scheduled visit.  24 minutes of total time was spent for this patient encounter, including preparation, face-to-face counseling with the patient and coordination of care, and documentation of the encounter.  Comer Dollar, MD  Division of Gynecologic Oncology  Department of Obstetrics and Gynecology  [facility]      [1]  Current Outpatient Medications:    ALPRAZolam  (XANAX ) 0.25 MG tablet, Take 1 tablet (0.25 mg total) by mouth at bedtime as needed for anxiety., Disp: 30 tablet, Rfl: 1   Calcium  Carbonate-Vitamin D  (CALCIUM  600-D PO), Take 600 mg by mouth daily., Disp: , Rfl:    doxylamine , Sleep, (UNISOM ) 25 MG tablet, Take 12.5 mg by mouth at bedtime., Disp: , Rfl:    hydrochlorothiazide  (HYDRODIURIL ) 12.5 MG tablet, Take 1 tablet (12.5 mg total) by mouth in the morning., Disp: 90 tablet, Rfl: 1   lidocaine -prilocaine  (EMLA ) cream, Apply 1 Application topically as needed (Apply to port 1-2 hours prior to its use)., Disp: 30 g, Rfl: 0   losartan  (COZAAR ) 25 MG tablet, Take 1 tablet (25 mg total) by mouth daily., Disp: 100 tablet, Rfl: 3   Multiple Vitamin (MULTIVITAMIN) tablet, Take 1 tablet by mouth daily., Disp: , Rfl:     olaparib  (LYNPARZA ) 150 MG tablet, Take 2 tablets (300 mg total) by mouth 2 (two) times daily. Swallow whole. May take with food to decrease nausea and vomiting., Disp: 120 tablet, Rfl: 1   ondansetron  (ZOFRAN ) 8 MG tablet, Take 8 mg by mouth every 8 (eight) hours as needed for nausea or vomiting. (Patient not taking: Reported on 02/03/2024), Disp: , Rfl:  potassium chloride  (MICRO-K ) 10 MEQ CR capsule, Take 1 capsule (10 mEq total) by mouth 2 (two) times daily., Disp: 60 capsule, Rfl: 2   prochlorperazine  (COMPAZINE ) 10 MG tablet, Take 10 mg by mouth every 6 (six) hours as needed for nausea or vomiting. (Patient not taking: Reported on 02/03/2024), Disp: , Rfl:    rosuvastatin  (CRESTOR ) 5 MG tablet, Take 1 tablet (5 mg total) by mouth daily., Disp: 90 tablet, Rfl: 3   zolpidem  (AMBIEN ) 10 MG tablet, Take 1 tablet (10 mg total) by mouth at bedtime as needed for sleep. (Patient not taking: Reported on 02/03/2024), Disp: 30 tablet, Rfl: 1

## 2024-02-25 NOTE — Addendum Note (Signed)
 Addended by: Rollo Farquhar R on: 02/25/2024 02:37 PM   Modules accepted: Orders

## 2024-02-25 NOTE — Patient Instructions (Signed)
 It was good to see you today.  I do not see or feel any evidence of cancer recurrence on your exam.  I will see you for follow-up in 4 months.  As always, if you develop any new and concerning symptoms before your next visit, please call to see me sooner.

## 2024-03-01 LAB — CYTOLOGY - PAP
Adequacy: ABSENT
Comment: NEGATIVE
High risk HPV: POSITIVE — AB

## 2024-03-02 ENCOUNTER — Ambulatory Visit: Payer: Self-pay | Admitting: Gynecologic Oncology

## 2024-03-07 ENCOUNTER — Other Ambulatory Visit (HOSPITAL_BASED_OUTPATIENT_CLINIC_OR_DEPARTMENT_OTHER): Payer: Self-pay

## 2024-03-08 ENCOUNTER — Other Ambulatory Visit: Payer: Self-pay

## 2024-03-08 ENCOUNTER — Other Ambulatory Visit (HOSPITAL_BASED_OUTPATIENT_CLINIC_OR_DEPARTMENT_OTHER): Payer: Self-pay

## 2024-03-08 MED ORDER — LOSARTAN POTASSIUM 25 MG PO TABS
25.0000 mg | ORAL_TABLET | Freq: Every day | ORAL | 3 refills | Status: AC
  Filled 2024-03-08: qty 100, 100d supply, fill #0

## 2024-03-09 ENCOUNTER — Other Ambulatory Visit: Payer: Self-pay

## 2024-03-13 ENCOUNTER — Other Ambulatory Visit: Payer: Self-pay

## 2024-03-13 NOTE — Progress Notes (Signed)
 Clinical Intervention Note  Clinical Intervention Notes: Patient reported taking extra strength tylenol  as needed for musculoskeletal discomfort. No DDIs identified with Lynparza .   Clinical Intervention Outcomes: Prevention of an adverse drug event   Advertising Account Planner

## 2024-03-13 NOTE — Progress Notes (Signed)
 Specialty Pharmacy Refill Coordination Note  Debra Nguyen is a 69 y.o. female contacted today regarding refills of specialty medication(s) Olaparib  (LYNPARZA )   Patient requested Delivery   Delivery date: 03/17/24   Verified address: [address] [place], [place] [address]   Medication will be filled on: 03/16/24

## 2024-03-15 ENCOUNTER — Encounter: Payer: Self-pay | Admitting: Oncology

## 2024-03-15 ENCOUNTER — Telehealth: Payer: Self-pay | Admitting: *Deleted

## 2024-03-15 ENCOUNTER — Other Ambulatory Visit (HOSPITAL_BASED_OUTPATIENT_CLINIC_OR_DEPARTMENT_OTHER): Payer: Self-pay

## 2024-03-15 MED ORDER — CYCLOBENZAPRINE HCL 10 MG PO TABS
10.0000 mg | ORAL_TABLET | Freq: Every day | ORAL | 0 refills | Status: AC | PRN
  Filled 2024-03-15: qty 30, 30d supply, fill #0

## 2024-03-15 NOTE — Telephone Encounter (Signed)
 Called Debra Nguyen to f/u on MyChart message regarding pain in left buttock and lateral/posterior thigh. Started on 1/17 and thinks it is due to the massage she had on 02/29/24 (was vigorous and what she requested). Still painful, but is slowly improving. She admits on12/18/25 she was having some discomfort in back,knees and hips). Taking Ibuprofen  400 mg daily + heating pad prn. Also Flexeril  at bedtime (medication is over 71 year old and has #2 left).  Asking if this could otherwise be a side effect of the olaparib  (started 12/24/23) and will MD approve a refill on the Flexeril ?

## 2024-03-15 NOTE — Telephone Encounter (Signed)
 [name] does not feel the pain is related to the olaparib . Let's re-evaluate the pain on 03/23/24 and go from there. OK to refill Flexeril . Patient notified.

## 2024-03-16 ENCOUNTER — Other Ambulatory Visit (HOSPITAL_BASED_OUTPATIENT_CLINIC_OR_DEPARTMENT_OTHER): Payer: Self-pay

## 2024-03-16 ENCOUNTER — Other Ambulatory Visit: Payer: Self-pay

## 2024-03-21 ENCOUNTER — Telehealth: Payer: Self-pay | Admitting: *Deleted

## 2024-03-21 ENCOUNTER — Encounter: Payer: Self-pay | Admitting: Oncology

## 2024-03-21 ENCOUNTER — Other Ambulatory Visit: Payer: Self-pay | Admitting: *Deleted

## 2024-03-21 DIAGNOSIS — C569 Malignant neoplasm of unspecified ovary: Secondary | ICD-10-CM

## 2024-03-21 NOTE — Telephone Encounter (Addendum)
 Ms. Skora sent Mychart message reporting more pain and is in left buttock and posterior left thigh that is like an electric shock with some numbness in left heel. Per [name]: Needs CT abd/pelvis w/contrast and CT lumbar spine without contrast asap. Ronal is aware-waiting for PA to come through to schedule. Offered her script for Hydrocodone -apap and she declines. Able to schedule her CTs at [facility] tomorrow at 1:45 check in for contrast. She will go to [facility] lab prior for labs. Prefers scan/labs to be done peripheral tomorrow and will have port flush on 2/5. Will have all her 2/5 labs done on 2/4 at [facility]. Scheduler made aware to set up and call patient.

## 2024-03-22 ENCOUNTER — Other Ambulatory Visit: Payer: Self-pay | Admitting: Oncology

## 2024-03-22 ENCOUNTER — Ambulatory Visit (HOSPITAL_COMMUNITY): Admission: RE | Admit: 2024-03-22 | Discharge: 2024-03-22 | Attending: Oncology | Admitting: Oncology

## 2024-03-22 ENCOUNTER — Inpatient Hospital Stay: Attending: Nurse Practitioner

## 2024-03-22 DIAGNOSIS — C579 Malignant neoplasm of female genital organ, unspecified: Secondary | ICD-10-CM

## 2024-03-22 DIAGNOSIS — C569 Malignant neoplasm of unspecified ovary: Secondary | ICD-10-CM

## 2024-03-22 LAB — CMP (CANCER CENTER ONLY)
ALT: 67 U/L — ABNORMAL HIGH (ref 0–44)
AST: 48 U/L — ABNORMAL HIGH (ref 15–41)
Albumin: 4.6 g/dL (ref 3.5–5.0)
Alkaline Phosphatase: 99 U/L (ref 38–126)
Anion gap: 12 (ref 5–15)
BUN: 12 mg/dL (ref 8–23)
CO2: 26 mmol/L (ref 22–32)
Calcium: 9.5 mg/dL (ref 8.9–10.3)
Chloride: 100 mmol/L (ref 98–111)
Creatinine: 0.79 mg/dL (ref 0.44–1.00)
GFR, Estimated: >60 mL/min
Glucose, Bld: 101 mg/dL — ABNORMAL HIGH (ref 70–99)
Potassium: 4 mmol/L (ref 3.5–5.1)
Sodium: 138 mmol/L (ref 135–145)
Total Bilirubin: 1.2 mg/dL (ref 0.0–1.2)
Total Protein: 7.1 g/dL (ref 6.5–8.1)

## 2024-03-22 LAB — CBC WITH DIFFERENTIAL (CANCER CENTER ONLY)
Abs Immature Granulocytes: 0 10*3/uL (ref 0.00–0.07)
Basophils Absolute: 0 10*3/uL (ref 0.0–0.1)
Basophils Relative: 1 %
Eosinophils Absolute: 0.1 10*3/uL (ref 0.0–0.5)
Eosinophils Relative: 3 %
HCT: 37.6 % (ref 36.0–46.0)
Hemoglobin: 13.5 g/dL (ref 12.0–15.0)
Immature Granulocytes: 0 %
Lymphocytes Relative: 39 %
Lymphs Abs: 1.4 10*3/uL (ref 0.7–4.0)
MCH: 36.7 pg — ABNORMAL HIGH (ref 26.0–34.0)
MCHC: 35.9 g/dL (ref 30.0–36.0)
MCV: 102.2 fL — ABNORMAL HIGH (ref 80.0–100.0)
Monocytes Absolute: 0.3 10*3/uL (ref 0.1–1.0)
Monocytes Relative: 10 %
Neutro Abs: 1.6 10*3/uL — ABNORMAL LOW (ref 1.7–7.7)
Neutrophils Relative %: 47 %
Platelet Count: 209 10*3/uL (ref 150–400)
RBC: 3.68 MIL/uL — ABNORMAL LOW (ref 3.87–5.11)
RDW: 18.2 % — ABNORMAL HIGH (ref 11.5–15.5)
WBC Count: 3.5 10*3/uL — ABNORMAL LOW (ref 4.0–10.5)
nRBC: 0 % (ref 0.0–0.2)

## 2024-03-22 MED ORDER — IOHEXOL 300 MG/ML  SOLN
100.0000 mL | Freq: Once | INTRAMUSCULAR | Status: AC | PRN
  Administered 2024-03-22: 100 mL via INTRAVENOUS

## 2024-03-23 ENCOUNTER — Encounter: Payer: Self-pay | Admitting: Nurse Practitioner

## 2024-03-23 ENCOUNTER — Inpatient Hospital Stay: Admitting: Nurse Practitioner

## 2024-03-23 ENCOUNTER — Inpatient Hospital Stay

## 2024-03-23 ENCOUNTER — Inpatient Hospital Stay: Admitting: Oncology

## 2024-03-23 VITALS — BP 126/85 | HR 93 | Temp 97.9°F | Resp 16 | Wt 134.6 lb

## 2024-03-23 DIAGNOSIS — M79605 Pain in left leg: Secondary | ICD-10-CM

## 2024-03-23 DIAGNOSIS — C569 Malignant neoplasm of unspecified ovary: Secondary | ICD-10-CM

## 2024-03-23 LAB — CA 125: Cancer Antigen (CA) 125: 7.9 U/mL (ref 0.0–38.1)

## 2024-03-23 NOTE — Progress Notes (Signed)
 " [facility] OFFICE PROGRESS NOTE   Diagnosis: Fallopian tube carcinoma  INTERVAL HISTORY:   Debra Nguyen returns as scheduled.  She continues olaparib .  Arthralgias are better, and mainly notes intermittent elbow pain.  No nausea or vomiting.  No mouth sores.  No diarrhea.  No rash.  No cough or shortness of breath.  No bleeding.    She contacted the office 03/21/2024 to report pain at the left lower buttock/upper posterior leg radiating to above the posterior knee.  Over the weekend she noted numbness in the left heel.  No leg weakness or numbness.  No bowel or bladder dysfunction.  She notes improvement in the pain with Motrin .  She reports a recent massage but no other unusual activities.  Objective:  Vital signs in last 24 hours:  Blood pressure 126/85, pulse 93, temperature 97.9 F (36.6 C), temperature source Temporal, resp. rate 16, weight 134 lb 9.6 oz (61.1 kg), last menstrual period 02/02/2011, SpO2 100%.    HEENT: No thrush or ulcers. Lymphatics: No palpable cervical, supraclavicular or inguinal lymph nodes. Resp: Lungs clear bilaterally. Cardio: Regular rate and rhythm. GI: No hepatosplenomegaly.  Nontender. Vascular: No leg edema. Neuro: Lower extremity motor strength 5/5.  Knee DTRs 1+ bilaterally. Skin: No rash. Port-A-Cath without erythema.  Lab Results:  Lab Results  Component Value Date   WBC 3.5 (L) 03/22/2024   HGB 13.5 03/22/2024   HCT 37.6 03/22/2024   MCV 102.2 (H) 03/22/2024   PLT 209 03/22/2024   NEUTROABS 1.6 (L) 03/22/2024    Imaging:  CT ABDOMEN PELVIS W CONTRAST Result Date: 03/22/2024 EXAM: CT ABDOMEN AND PELVIS WITH CONTRAST 03/22/2024 04:30:05 PM TECHNIQUE: CT of the abdomen and pelvis was performed with the administration of 100 mL iohexol  (OMNIPAQUE ) 300 MG/ML solution. Multiplanar reformatted images are provided for review. Automated exposure control, iterative reconstruction, and/or weight-based adjustment of the mA/kV was  utilized to reduce the radiation dose to as low as reasonably achievable. COMPARISON: CT chest, abdomen and pelvis 12/29/2023. CLINICAL HISTORY: New pelvic pain and follow-up ovarian cancer. Completed chemotherapy October 2025. FINDINGS: LOWER CHEST: No acute abnormality. LIVER: The liver is unremarkable. GALLBLADDER AND BILE DUCTS: Gallbladder is unremarkable. No biliary ductal dilatation. SPLEEN: No acute abnormality. PANCREAS: Subcentimeter hypodense 0.6 cm pancreatic head lesion on series 2, image 33 is too small to characterize and is unchanged. Otherwise normal pancreas. No significant pancreatic duct dilation. ADRENAL GLANDS: No acute abnormality. KIDNEYS, URETERS AND BLADDER: Subcentimeter low attenuation upper left renal cortical lesion is too small to characterize and unchanged, considered benign, requiring no imaging follow up. Otherwise normal kidneys. No hydronephrosis. No stones in the kidneys or ureters. No perinephric or periureteral stranding. Mildly distended urinary bladder, which is otherwise normal. GI AND BOWEL: Stomach demonstrates no acute abnormality. Oral contrast transits to the right colon. Normal appendix. Moderate diffuse colonic stool. No large bowel wall thickening or significant diverticulosis. Suture line again noted at the colorectal anastomosis. No dilated or thick walled small bowel loops. PERITONEUM AND RETROPERITONEUM: No ascites. No free air. VASCULATURE: Atherosclerotic nonaneurysmal abdominal aorta. LYMPH NODES: No lymphadenopathy. REPRODUCTIVE ORGANS: Hysterectomy. No adnexal masses. BONES AND SOFT TISSUES: Mild lumbar spondylosis. Advanced bilateral lower lumbar facet arthropathy. Stable bone island in the posterior inferior L3 vertebral body. No focal soft tissue abnormality. IMPRESSION: 1. No acute abdominopelvic abnormality. 2. No evidence of metastatic disease in the abdomen or pelvis. 3. No hydronephrosis. Mild urinary bladder distention. 4. Moderate diffuse colonic  stool burden, suggesting constipation. 5.  Advanced bilateral lower lumbar facet arthropathy. 6. Aortic Atherosclerosis (ICD10-I70.0). Electronically signed by: Selinda Blue MD 03/22/2024 05:01 PM EST RP Workstation: [id number]   CT LUMBAR SPINE WO CONTRAST Result Date: 03/22/2024 EXAM: CT OF THE LUMBAR SPINE WITHOUT CONTRAST 03/22/2024 04:30:05 PM TECHNIQUE: CT of the lumbar spine was performed without the administration of intravenous contrast. Multiplanar reformatted images are provided for review. Automated exposure control, iterative reconstruction, and/or weight based adjustment of the mA/kV was utilized to reduce the radiation dose to as low as reasonably achievable. COMPARISON: CT chest abdomen and pelvis 12/29/2023. CLINICAL HISTORY: FINDINGS: BONES AND ALIGNMENT: Similar alignment of the lumbar spine. There is grade 1 anterolisthesis of L3 on L4 and L4 on L5. Lumbar lordosis is maintained. Vertebral body heights are maintained. There is no evidence of acute fracture. A sclerotic focus in the right aspect of the L3 vertebral body is unchanged and likely reflects a bone island. Chronic appearing radiolucency of the L5 superior endplate with mild sclerosis, likely secondary to degenerative changes. DEGENERATIVE CHANGES: There is a small disc bulge at L1-L2 without significant spinal canal stenosis. Mild to moderate facet arthrosis at L2-L3. There is a diffuse disc bulge at L3-L4 along with moderate to severe facet arthrosis resulting in mild spinal canal stenosis. Additional disc bulge and severe facet arthrosis at L4-L5 resulting in moderate spinal canal stenosis, greater on the left. There is left lateral recess narrowing with potential for impingement of the traversing left L5 nerve root. Diffuse disc bulge at L5-S1 likely resulting in lateral recess narrowing with potential for impingement of the traversing S1 nerve roots. There is foraminal stenosis on the right at L4-L5 with potential contact of the  exiting nerve root by prominent facet osteophytes and the L4-L5 disc bulge. SOFT TISSUES: No acute abnormality. VASCULATURE: Mild atherosclerosis of the abdominal aorta and branch vessels. KIDNEYS, URETERS, AND BLADDER: There is a nonobstructing 3 mm calculus in the lower pole of the left kidney. IMPRESSION: 1. Multilevel degenerative changes, including severe facet arthrosis at L4-L5 with moderate spinal canal stenosis and left lateral recess narrowing with potential impingement of the traversing left L5 nerve root. Additional lateral recess narrowing at L5-S1 with potential traversing S1 nerve root impingement. 2. Grade 1 anterolisthesis of L3 on L4 and L4 on L5. Electronically signed by: Donnice Mania MD 03/22/2024 04:48 PM EST RP Workstation: [id number]    Medications: I have reviewed the patient's current medications.  Assessment/Plan: Fallopian tube carcinoma, right fallopian tube CT abdomen/pelvis 05/13/2023-5.0 cm apple core lesion in the upper/mid rectum.  Peritoneal disease/omental caking with multiple cystic metastases noted as well as abdominopelvic lymphadenopathy.  Colonoscopy 05/25/2023-infiltrative partially obstructing large mass in the rectosigmoid colon from 20 to 28 cm from the anal verge.  The mass was circumferential.  Oozing was present.  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 Monia 3 cycles of chemotherapy then repeat imaging, discuss if she is  a candidate for debulking surgery Cycle 2 Taxol /carboplatin  06/23/2023, Udenyca  Cycle 3 Taxol /carboplatin  07/15/2023, Udenyca  CTs 08/02/2023-moderate to marked response to therapy of lower cervical, thoracic, abdominal and pelvic nodal metastasis.  Moderate response of abdominopelvic omental/peritoneal metastasis.  Similar to minimal decrease in size of a dominant left pelvic mass.  New mild left-sided hydroureteronephrosis likely secondary to the dominant mass. Cycle 4 Taxol /carboplatin  08/04/2023, Udenyca  09/01/2023-diagnostic laparoscopy, BSO, omentectomy, rectosigmoid resection, pelvic sidewall biopsy, bladder peritoneum biopsy, sigmoid nodule, small bowel mesenteric nodule resection.  High-grade carcinoma with psammoma calcifications and fibrosis involving the omentum, right fallopian tube and right ovary, left ovary, rectosigmoid colon, 4/20 lymph nodes positive, fibrosis with psammoma calcifications at the pelvic sidewall and bladder peritoneum biopsies, high-grade carcinoma involving the sigmoid colon nodule, fibrotic nodule with numerous psammoma calcifications at the small bowel mesentery nodule; high-grade serous carcinoma, FIGO grade 3, chemotherapy response score 2,[id number] Foundation One: MSI stable, HRDsig negative TMB 4 Muts/Mb, BRCA1 rearrangement exon 10 (Germline testing BRCA negative 06/18/23) Cycle 5 Taxol /Carboplatin  10/20/2023, Udenyca  Cycle 6 Taxol /carboplatin  11/10/2023, Udenyca  Cycle 7 Taxol /carboplatin  12/01/2023 12/24/2023 olaparib  CTs 12/29/2023: Right pericardiophrenic node measures 0.6 m compared to 1 cm 09/23/2023, no pathologically enlarged chest nodes, no evidence of residual or recurrent disease; addendum issued 01/26/2024 stating no pathologically enlarged lymph nodes in the abdomen or pelvis (previously reported as new pathologically enlarged lymph nodes in the abdomen or pelvis on CT report 12/29/2023) CTs abdomen/pelvis and lumbar spine 03/22/2024-no evidence of metastatic  disease.  No hydronephrosis.  Moderate diffuse colonic stool burden suggesting constipation.  Advanced bilateral lower lumbar facet arthropathy.  Multilevel degenerative changes including severe facet arthrosis at L4-L5 with moderate spinal canal stenosis and left lateral recess narrowing with potential impingement of the traversing left L5 nerve root.  Additional lateral recess narrowing at L5-S1's with potential traversing S1 nerve root impingement.   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 difficile colitis 09/16/2023; vanc taper starting 10/13/23, completed 10/29/2023 Left lower buttock/posterior leg pain-CT with degenerative changes, possible nerve impingement L5-S1.    Disposition: Ms. Scatena appears stable.  She continues olaparib , overall tolerating well.  Recent CTs abdomen/pelvis show no evidence of metastatic disease.  CA125 remains stable in normal range.  CBC and chemistry panel reviewed.  Labs adequate to continue olaparib .  She has mild neutropenia.  We reviewed precautions.  She understands to contact the office with fever, chills, other signs of infection.  Lumbar spine CT shows degenerative changes with potential nerve root impingement L5-S1.  Referral placed to Dr. Alm Molt, neurosurgery.  She will return for lab, port flush and an office visit in 6 weeks.  We are available to see her sooner if needed.  Patient seen with [name].    Olam Ned ANP/GNP-BC   03/23/2024  11:47 AM   This was a shared visit with Olam Ned.  Ms. Thang was interviewed and examined.  She has developed pain in the left posterior leg.  CTs yesterday revealed no evidence of recurrent fallopian tube carcinoma.  She appears to have degenerative spine disease with potential left nerve impingement at L5 and S1.  We will make a referral to Dr. Molt.  She will continue olaparib .  Arvella Cloretta, MD     "

## 2024-03-24 ENCOUNTER — Encounter: Payer: Self-pay | Admitting: Gynecologic Oncology

## 2024-05-04 ENCOUNTER — Inpatient Hospital Stay: Admitting: Oncology

## 2024-05-04 ENCOUNTER — Inpatient Hospital Stay

## 2024-06-29 ENCOUNTER — Inpatient Hospital Stay: Admitting: Gynecologic Oncology

## 2024-07-06 ENCOUNTER — Encounter (INDEPENDENT_AMBULATORY_CARE_PROVIDER_SITE_OTHER): Admitting: Ophthalmology

## 2024-07-14 ENCOUNTER — Encounter (INDEPENDENT_AMBULATORY_CARE_PROVIDER_SITE_OTHER): Admitting: Ophthalmology
